# Patient Record
Sex: Male | Born: 1945
Health system: Southern US, Community
[De-identification: ages and names within clinical notes are randomized; demographics above are authoritative.]

## PROBLEM LIST (undated history)

## (undated) DIAGNOSIS — Z8781 Personal history of (healed) traumatic fracture: Secondary | ICD-10-CM

## (undated) DIAGNOSIS — G4730 Sleep apnea, unspecified: Secondary | ICD-10-CM

## (undated) DIAGNOSIS — K449 Diaphragmatic hernia without obstruction or gangrene: Secondary | ICD-10-CM

## (undated) DIAGNOSIS — C4490 Unspecified malignant neoplasm of skin, unspecified: Secondary | ICD-10-CM

## (undated) DIAGNOSIS — Z8489 Family history of other specified conditions: Secondary | ICD-10-CM

## (undated) DIAGNOSIS — R7303 Prediabetes: Secondary | ICD-10-CM

## (undated) DIAGNOSIS — Z860101 Personal history of adenomatous and serrated colon polyps: Secondary | ICD-10-CM

## (undated) DIAGNOSIS — E785 Hyperlipidemia, unspecified: Secondary | ICD-10-CM

## (undated) DIAGNOSIS — J869 Pyothorax without fistula: Secondary | ICD-10-CM

## (undated) DIAGNOSIS — I10 Essential (primary) hypertension: Secondary | ICD-10-CM

## (undated) DIAGNOSIS — J309 Allergic rhinitis, unspecified: Secondary | ICD-10-CM

## (undated) DIAGNOSIS — E669 Obesity, unspecified: Secondary | ICD-10-CM

## (undated) DIAGNOSIS — N400 Enlarged prostate without lower urinary tract symptoms: Secondary | ICD-10-CM

## (undated) DIAGNOSIS — K219 Gastro-esophageal reflux disease without esophagitis: Secondary | ICD-10-CM

## (undated) DIAGNOSIS — Z86010 Personal history of colonic polyps: Secondary | ICD-10-CM

## (undated) DIAGNOSIS — Z8619 Personal history of other infectious and parasitic diseases: Secondary | ICD-10-CM

## (undated) DIAGNOSIS — Z87442 Personal history of urinary calculi: Secondary | ICD-10-CM

## (undated) DIAGNOSIS — M1990 Unspecified osteoarthritis, unspecified site: Secondary | ICD-10-CM

## (undated) HISTORY — PX: HERNIA REPAIR: SHX51

## (undated) HISTORY — PX: APPENDECTOMY: SHX54

## (undated) HISTORY — PX: EMPYEMA DRAINAGE: SHX5097

## (undated) HISTORY — PX: LUMBAR PUNCTURE: SHX1985

## (undated) HISTORY — PX: FRACTURE SURGERY: SHX138

## (undated) HISTORY — PX: JOINT REPLACEMENT: SHX530

## (undated) HISTORY — PX: CATARACT EXTRACTION: SUR2

## (undated) HISTORY — PX: EYE SURGERY: SHX253

---

## 2000-07-13 ENCOUNTER — Ambulatory Visit (HOSPITAL_BASED_OUTPATIENT_CLINIC_OR_DEPARTMENT_OTHER): Admission: RE | Admit: 2000-07-13 | Discharge: 2000-07-13 | Payer: Self-pay | Admitting: Orthopedic Surgery

## 2001-02-23 HISTORY — PX: FRACTURE SURGERY: SHX138

## 2007-07-14 ENCOUNTER — Ambulatory Visit: Payer: Self-pay | Admitting: Family Medicine

## 2007-09-16 ENCOUNTER — Ambulatory Visit: Payer: Self-pay | Admitting: Gastroenterology

## 2010-01-07 ENCOUNTER — Ambulatory Visit: Payer: Self-pay | Admitting: Internal Medicine

## 2012-02-25 ENCOUNTER — Ambulatory Visit: Payer: Self-pay | Admitting: Family Medicine

## 2012-02-25 LAB — RAPID STREP-A WITH REFLX: Micro Text Report: NEGATIVE

## 2012-02-27 LAB — BETA STREP CULTURE(ARMC)

## 2012-05-11 ENCOUNTER — Ambulatory Visit: Payer: Self-pay | Admitting: Family Medicine

## 2012-05-11 LAB — RAPID INFLUENZA A&B ANTIGENS

## 2013-05-22 ENCOUNTER — Ambulatory Visit: Payer: Self-pay | Admitting: Gastroenterology

## 2013-05-25 LAB — PATHOLOGY REPORT

## 2013-09-11 DIAGNOSIS — K219 Gastro-esophageal reflux disease without esophagitis: Secondary | ICD-10-CM | POA: Insufficient documentation

## 2013-09-11 DIAGNOSIS — I10 Essential (primary) hypertension: Secondary | ICD-10-CM | POA: Insufficient documentation

## 2014-02-25 ENCOUNTER — Ambulatory Visit: Payer: Self-pay | Admitting: Internal Medicine

## 2014-10-19 ENCOUNTER — Ambulatory Visit
Admission: EM | Admit: 2014-10-19 | Discharge: 2014-10-19 | Disposition: A | Discharge status: Home / Self Care | Payer: PPO | Attending: Family Medicine | Admitting: Family Medicine

## 2014-10-19 ENCOUNTER — Encounter: Payer: Self-pay | Admitting: Emergency Medicine

## 2014-10-19 DIAGNOSIS — S86811A Strain of other muscle(s) and tendon(s) at lower leg level, right leg, initial encounter: Secondary | ICD-10-CM

## 2014-10-19 DIAGNOSIS — M7121 Synovial cyst of popliteal space [Baker], right knee: Secondary | ICD-10-CM

## 2014-10-19 DIAGNOSIS — S86911A Strain of unspecified muscle(s) and tendon(s) at lower leg level, right leg, initial encounter: Secondary | ICD-10-CM

## 2014-10-19 HISTORY — DX: Essential (primary) hypertension: I10

## 2014-10-19 MED ORDER — MELOXICAM 15 MG PO TABS: 15.0000 mg | ORAL_TABLET | Freq: Every day | ORAL | Status: DC

## 2014-10-19 NOTE — Discharge Instructions (Signed)
Knee Effusion  Knee effusion means you have fluid in your knee. The knee may be more difficult to bend and move. HOME CARE  Use crutches or a brace as told by your doctor.  Put ice on the injured area.  Put ice in a plastic bag.  Place a towel between your skin and the bag.  Leave the ice on for 15-20 minutes, 03-04 times a day.  Raise (elevate) your knee as much as possible.  Only take medicine as told by your doctor.  You may need to do strengthening exercises. Ask your doctor.  Continue with your normal diet and activities as told by your doctor. GET HELP RIGHT AWAY IF:  You have more puffiness (swelling) in your knee.  You see redness, puffiness, or have more pain in your knee.  You have a temperature by mouth above 102 F (38.9 C).  You get a rash.  You have trouble breathing.  You have a reaction to any medicine you are taking.  You have a lot of pain when you move your knee. MAKE SURE YOU:  Understand these instructions.  Will watch your condition.  Will get help right away if you are not doing well or get worse. Document Released: 03/14/2010 Document Revised: 05/04/2011 Document Reviewed: 03/14/2010 [facility] Patient Information 2015 [place], [place]. This information is not intended to replace advice given to you by your health care provider. Make sure you discuss any questions you have with your health care provider.  Knee Pain Knee pain can be a result of an injury or other medical conditions. Treatment will depend on the cause of your pain. HOME CARE  Only take medicine as told by your doctor.  Keep a healthy weight. Being overweight can make the knee hurt more.  Stretch before exercising or playing sports.  If there is constant knee pain, change the way you exercise. Ask your doctor for advice.  Make sure shoes fit well. Choose the right shoe for the sport or activity.  Protect your knees. Wear kneepads if needed.  Rest when you are  tired. GET HELP RIGHT AWAY IF:   Your knee pain does not stop.  Your knee pain does not get better.  Your knee joint feels hot to the touch.  You have a fever. MAKE SURE YOU:   Understand these instructions.  Will watch this condition.  Will get help right away if you are not doing well or get worse. Document Released: 05/08/2008 Document Revised: 05/04/2011 Document Reviewed: 05/08/2008 [facility] Patient Information 2015 [place], [place]. This information is not intended to replace advice given to you by your health care provider. Make sure you discuss any questions you have with your health care provider.  Knee Sprain A knee sprain is a tear in the strong bands of tissue that connect the bones (ligaments) of your knee. HOME CARE  Raise (elevate) your injured knee to lessen puffiness (swelling).  To ease pain and puffiness, put ice on the injured area.  Put ice in a plastic bag.  Place a towel between your skin and the bag.  Leave the ice on for 20 minutes, 2-3 times a day.  Only take medicine as told by your doctor.  Do not leave your knee unprotected until pain and stiffness go away (usually 4-6 weeks).  If you have a cast or splint, do not get it wet. If your doctor told you to not take it off, cover it with a plastic bag when you shower or bathe.  Do not swim.  Your doctor may have you do exercises to prevent or limit permanent weakness and stiffness. GET HELP RIGHT AWAY IF:   Your cast or splint becomes damaged.  Your pain gets worse.  You have a lot of pain, puffiness, or numbness below the cast or splint. MAKE SURE YOU:   Understand these instructions.  Will watch your condition.  Will get help right away if you are not doing well or get worse. Document Released: 01/28/2009 Document Revised: 02/14/2013 Document Reviewed: 10/18/2012 [facility] Patient Information 2015 [place], [place]. This information is not intended to replace advice given to you by your  health care provider. Make sure you discuss any questions you have with your health care provider.  Cryotherapy Cryotherapy is when you put ice on your injury. Ice helps lessen pain and puffiness (swelling) after an injury. Ice works the best when you start using it in the first 24 to 48 hours after an injury. HOME CARE  Put a dry or damp towel between the ice pack and your skin.  You may press gently on the ice pack.  Leave the ice on for no more than 10 to 20 minutes at a time.  Check your skin after 5 minutes to make sure your skin is okay.  Rest at least 20 minutes between ice pack uses.  Stop using ice when your skin loses feeling (numbness).  Do not use ice on someone who cannot tell you when it hurts. This includes small children and people with memory problems (dementia). GET HELP RIGHT AWAY IF:  You have white spots on your skin.  Your skin turns blue or pale.  Your skin feels waxy or hard.  Your puffiness gets worse. MAKE SURE YOU:   Understand these instructions.  Will watch your condition.  Will get help right away if you are not doing well or get worse. Document Released: 07/29/2007 Document Revised: 05/04/2011 Document Reviewed: 10/02/2010 [facility] Patient Information 2015 [place], [place]. This information is not intended to replace advice given to you by your health care provider. Make sure you discuss any questions you have with your health care provider.  Baker Cyst A Baker cyst is a sac-like structure that forms in the back of the knee. It is filled with the same fluid that is located in your knee. This fluid lubricates the bones and cartilage of the knee and allows them to move over each other more easily. CAUSES  When the knee becomes injured or inflamed, increased fluid forms in the knee. When this happens, the joint lining is pushed out behind the knee and forms the Baker cyst. This cyst may also be caused by inflammation from arthritic conditions and  infections. SIGNS AND SYMPTOMS  A Baker cyst usually has no symptoms. When the cyst is substantially enlarged:  You may feel pressure behind the knee, stiffness in the knee, or a mass in the area behind the knee.  You may develop pain, redness, and swelling in the calf. This can suggest a blood clot and requires evaluation by your health care provider. DIAGNOSIS  A Baker cyst is most often found during an ultrasound exam. This exam may have been performed for other reasons, and the cyst was found incidentally. Sometimes an MRI is used. This picks up other problems within a joint that an ultrasound exam may not. If the Baker cyst developed immediately after an injury, X-ray exams may be used to diagnose the cyst. TREATMENT  The treatment depends on the  cause of the cyst. Anti-inflammatory medicines and rest often will be prescribed. If the cyst is caused by a bacterial infection, antibiotic medicines may be prescribed.  HOME CARE INSTRUCTIONS   If the cyst was caused by an injury, for the first 24 hours, keep the injured leg elevated on 2 pillows while lying down.  For the first 24 hours while you are awake, apply ice to the injured area:  Put ice in a plastic bag.  Place a towel between your skin and the bag.  Leave the ice on for 20 minutes, 2-3 times a day.  Only take over-the-counter or prescription medicines for pain, discomfort, or fever as directed by your health care provider.  Only take antibiotic medicine as directed. Make sure to finish it even if you start to feel better. MAKE SURE YOU:   Understand these instructions.  Will watch your condition.  Will get help right away if you are not doing well or get worse. Document Released: 02/09/2005 Document Revised: 11/30/2012 Document Reviewed: 09/21/2012 [facility] Patient Information 2015 [place], [place]. This information is not intended to replace advice given to you by your health care provider. Make sure you discuss any  questions you have with your health care provider.

## 2014-10-19 NOTE — ED Provider Notes (Signed)
CSN: [id number]     Arrival date & time 10/19/14  1429 History   First MD Initiated Contact with Patient 10/19/14 1515     Chief Complaint  Patient presents with  . Knee Injury   patient 69 year old white male who mowingHis yard Monday when he twisted his knee started having pain. States pain really started on Tuesday morning and has progressively gotten worse  he also reports discomfort when he tries to bend his leg and move his leg around as well. He works for [employer] and has been on his feet Tuesday with her Thursday with increased pain and discomfort in his leg.  (Consider location/radiation/quality/duration/timing/severity/associated sxs/prior Treatment) Patient is a 69 y.o. male presenting with knee pain. The history is provided by the patient. No language interpreter was used.  Knee Pain Location:  Knee Time since incident:  5 days Pain details:    Quality:  Aching, throbbing and shooting   Radiates to:  Does not radiate   Severity:  Moderate   Onset quality:  Sudden (On Monday afternoon after mowing his lawn)   Duration:  5 days   Progression:  Worsening Chronicity:  New Dislocation: no   Relieved by:  Nothing Worsened by:  Bearing weight and activity Ineffective treatments:  None tried Associated symptoms: decreased ROM, muscle weakness and swelling   Risk factors: no concern for non-accidental trauma, no frequent fractures, no known bone disorder, no obesity and no recent illness     Past Medical History  Diagnosis Date  . Hypertension    Past Surgical History  Procedure Laterality Date  . Fracture surgery     History reviewed. No pertinent family history. Social History  Substance Use Topics  . Smoking status: Never Smoker   . Smokeless tobacco: None  . Alcohol Use: Yes    Review of Systems  Constitutional: Positive for activity change.  Musculoskeletal: Positive for myalgias, joint swelling and gait problem.  Skin: Negative for color change.    Neurological: Positive for dizziness.    Allergies  Codeine  Home Medications   Prior to Admission medications   Medication Sig Start Date End Date Taking? Authorizing Provider  amLODipine (NORVASC) 10 MG tablet Take 10 mg by mouth daily.   Yes Historical Provider, MD  amLODipine-benazepril (LOTREL) 5-10 MG per capsule Take 1 capsule by mouth daily.   Yes Historical Provider, MD  atenolol (TENORMIN) 50 MG tablet Take 50 mg by mouth daily.   Yes Historical Provider, MD  meloxicam (MOBIC) 15 MG tablet Take 1 tablet (15 mg total) by mouth daily. 10/19/14   Frederich Cha, MD   Meds Ordered and Administered this Visit  Medications - No data to display  BP 156/65 mmHg  Pulse 65  Temp(Src) 98.7 F (37.1 C) (Oral)  Resp 16  Ht 5\' 11"  (1.803 m)  Wt 200 lb (90.719 kg)  BMI 27.91 kg/m2  SpO2 99% No data found.   Physical Exam  Constitutional: He is oriented to person, place, and time. He appears well-developed.  HENT:  Head: Normocephalic.  Musculoskeletal: He exhibits edema and tenderness.       Right upper leg: He exhibits tenderness, swelling and edema. He exhibits no bony tenderness.       Legs: Patient shows no laxity of the right knee joint no tenderness in the anterior portion of the knee however the posterior area he has a Baker's cyst is tender to palpation and is appreciably swollen.  Neurological: He is alert and oriented to  person, place, and time.  Skin: Skin is warm and dry.  Psychiatric: He has a normal mood and affect. His behavior is normal.  Vitals reviewed.   ED Course  Procedures (including critical care time)  Labs Review Labs Reviewed - No data to display  Imaging Review No results found.   Visual Acuity Review  Right Eye Distance:   Left Eye Distance:   Bilateral Distance:    Right Eye Near:   Left Eye Near:    Bilateral Near:         MDM   1. Knee strain, right, initial encounter   2. Baker cyst, right   We'll place on Mobic 15 mg 1  tablet day at this return Monday thinks not better recommend icing the knee elevating the leg and staying off the leg at least Monday and work note given for him for Sunday as well.  Patient that he may have a Baker's cyst but may need to be drained by orthopedic if not better. Explained to him I'm here Monday morning about that to be evaluated to light or shortness prostate referral he can call us and I'll be glad to try to facilitate that as well.    Frederich Cha, MD 10/19/14 2227

## 2014-10-19 NOTE — ED Notes (Signed)
Pt with pain x 3 days r knee

## 2015-03-05 DIAGNOSIS — R739 Hyperglycemia, unspecified: Secondary | ICD-10-CM | POA: Diagnosis not present

## 2015-03-05 DIAGNOSIS — Z79899 Other long term (current) drug therapy: Secondary | ICD-10-CM | POA: Diagnosis not present

## 2015-03-05 DIAGNOSIS — E7800 Pure hypercholesterolemia, unspecified: Secondary | ICD-10-CM | POA: Diagnosis not present

## 2015-03-11 DIAGNOSIS — E7800 Pure hypercholesterolemia, unspecified: Secondary | ICD-10-CM | POA: Diagnosis not present

## 2015-03-11 DIAGNOSIS — N400 Enlarged prostate without lower urinary tract symptoms: Secondary | ICD-10-CM | POA: Diagnosis not present

## 2015-03-11 DIAGNOSIS — Z79899 Other long term (current) drug therapy: Secondary | ICD-10-CM | POA: Diagnosis not present

## 2015-03-11 DIAGNOSIS — R972 Elevated prostate specific antigen [PSA]: Secondary | ICD-10-CM | POA: Diagnosis not present

## 2015-03-11 DIAGNOSIS — K219 Gastro-esophageal reflux disease without esophagitis: Secondary | ICD-10-CM | POA: Diagnosis not present

## 2015-03-11 DIAGNOSIS — I10 Essential (primary) hypertension: Secondary | ICD-10-CM | POA: Diagnosis not present

## 2015-03-11 DIAGNOSIS — R739 Hyperglycemia, unspecified: Secondary | ICD-10-CM | POA: Diagnosis not present

## 2015-05-09 DIAGNOSIS — H35033 Hypertensive retinopathy, bilateral: Secondary | ICD-10-CM | POA: Diagnosis not present

## 2015-06-03 DIAGNOSIS — Z1283 Encounter for screening for malignant neoplasm of skin: Secondary | ICD-10-CM | POA: Diagnosis not present

## 2015-06-03 DIAGNOSIS — Z872 Personal history of diseases of the skin and subcutaneous tissue: Secondary | ICD-10-CM | POA: Diagnosis not present

## 2015-06-03 DIAGNOSIS — D2339 Other benign neoplasm of skin of other parts of face: Secondary | ICD-10-CM | POA: Diagnosis not present

## 2015-06-03 DIAGNOSIS — Z85828 Personal history of other malignant neoplasm of skin: Secondary | ICD-10-CM | POA: Diagnosis not present

## 2015-06-03 DIAGNOSIS — Z08 Encounter for follow-up examination after completed treatment for malignant neoplasm: Secondary | ICD-10-CM | POA: Diagnosis not present

## 2015-09-03 DIAGNOSIS — N529 Male erectile dysfunction, unspecified: Secondary | ICD-10-CM | POA: Diagnosis not present

## 2015-09-03 DIAGNOSIS — Z79899 Other long term (current) drug therapy: Secondary | ICD-10-CM | POA: Diagnosis not present

## 2015-09-03 DIAGNOSIS — E7800 Pure hypercholesterolemia, unspecified: Secondary | ICD-10-CM | POA: Diagnosis not present

## 2015-09-03 DIAGNOSIS — R972 Elevated prostate specific antigen [PSA]: Secondary | ICD-10-CM | POA: Diagnosis not present

## 2015-09-03 DIAGNOSIS — R739 Hyperglycemia, unspecified: Secondary | ICD-10-CM | POA: Diagnosis not present

## 2015-09-03 DIAGNOSIS — Z125 Encounter for screening for malignant neoplasm of prostate: Secondary | ICD-10-CM | POA: Diagnosis not present

## 2015-09-10 DIAGNOSIS — N5201 Erectile dysfunction due to arterial insufficiency: Secondary | ICD-10-CM | POA: Diagnosis not present

## 2015-09-10 DIAGNOSIS — R972 Elevated prostate specific antigen [PSA]: Secondary | ICD-10-CM | POA: Diagnosis not present

## 2015-09-10 DIAGNOSIS — N401 Enlarged prostate with lower urinary tract symptoms: Secondary | ICD-10-CM | POA: Diagnosis not present

## 2015-09-10 DIAGNOSIS — E7800 Pure hypercholesterolemia, unspecified: Secondary | ICD-10-CM | POA: Diagnosis not present

## 2015-09-10 DIAGNOSIS — Z79899 Other long term (current) drug therapy: Secondary | ICD-10-CM | POA: Diagnosis not present

## 2015-09-10 DIAGNOSIS — R350 Frequency of micturition: Secondary | ICD-10-CM | POA: Diagnosis not present

## 2015-09-10 DIAGNOSIS — Z0000 Encounter for general adult medical examination without abnormal findings: Secondary | ICD-10-CM | POA: Diagnosis not present

## 2015-11-18 DIAGNOSIS — H40019 Open angle with borderline findings, low risk, unspecified eye: Secondary | ICD-10-CM | POA: Diagnosis not present

## 2016-03-05 DIAGNOSIS — E7800 Pure hypercholesterolemia, unspecified: Secondary | ICD-10-CM | POA: Diagnosis not present

## 2016-03-05 DIAGNOSIS — Z79899 Other long term (current) drug therapy: Secondary | ICD-10-CM | POA: Diagnosis not present

## 2016-03-05 DIAGNOSIS — R972 Elevated prostate specific antigen [PSA]: Secondary | ICD-10-CM | POA: Diagnosis not present

## 2016-03-26 DIAGNOSIS — Z79899 Other long term (current) drug therapy: Secondary | ICD-10-CM | POA: Diagnosis not present

## 2016-03-26 DIAGNOSIS — G4730 Sleep apnea, unspecified: Secondary | ICD-10-CM | POA: Diagnosis not present

## 2016-03-26 DIAGNOSIS — Z125 Encounter for screening for malignant neoplasm of prostate: Secondary | ICD-10-CM | POA: Diagnosis not present

## 2016-03-26 DIAGNOSIS — I10 Essential (primary) hypertension: Secondary | ICD-10-CM | POA: Diagnosis not present

## 2016-03-26 DIAGNOSIS — J069 Acute upper respiratory infection, unspecified: Secondary | ICD-10-CM | POA: Diagnosis not present

## 2016-03-26 DIAGNOSIS — E7800 Pure hypercholesterolemia, unspecified: Secondary | ICD-10-CM | POA: Diagnosis not present

## 2016-04-22 ENCOUNTER — Ambulatory Visit: Payer: PPO | Attending: Family Medicine

## 2016-04-22 DIAGNOSIS — G4733 Obstructive sleep apnea (adult) (pediatric): Secondary | ICD-10-CM | POA: Insufficient documentation

## 2016-04-22 DIAGNOSIS — E785 Hyperlipidemia, unspecified: Secondary | ICD-10-CM | POA: Insufficient documentation

## 2016-04-22 DIAGNOSIS — Z7982 Long term (current) use of aspirin: Secondary | ICD-10-CM | POA: Insufficient documentation

## 2016-04-22 DIAGNOSIS — Z79899 Other long term (current) drug therapy: Secondary | ICD-10-CM | POA: Insufficient documentation

## 2016-04-22 DIAGNOSIS — I10 Essential (primary) hypertension: Secondary | ICD-10-CM | POA: Diagnosis not present

## 2016-04-22 DIAGNOSIS — Z885 Allergy status to narcotic agent status: Secondary | ICD-10-CM | POA: Insufficient documentation

## 2016-04-22 DIAGNOSIS — G4731 Primary central sleep apnea: Secondary | ICD-10-CM | POA: Diagnosis not present

## 2016-04-26 DIAGNOSIS — G4733 Obstructive sleep apnea (adult) (pediatric): Secondary | ICD-10-CM | POA: Diagnosis not present

## 2016-05-07 DIAGNOSIS — R3912 Poor urinary stream: Secondary | ICD-10-CM | POA: Diagnosis not present

## 2016-05-07 DIAGNOSIS — N5201 Erectile dysfunction due to arterial insufficiency: Secondary | ICD-10-CM | POA: Diagnosis not present

## 2016-05-07 DIAGNOSIS — R972 Elevated prostate specific antigen [PSA]: Secondary | ICD-10-CM | POA: Diagnosis not present

## 2016-05-07 DIAGNOSIS — N401 Enlarged prostate with lower urinary tract symptoms: Secondary | ICD-10-CM | POA: Diagnosis not present

## 2016-05-19 ENCOUNTER — Ambulatory Visit: Payer: PPO | Attending: Neurology

## 2016-05-19 DIAGNOSIS — Z79899 Other long term (current) drug therapy: Secondary | ICD-10-CM | POA: Insufficient documentation

## 2016-05-19 DIAGNOSIS — Z7982 Long term (current) use of aspirin: Secondary | ICD-10-CM | POA: Diagnosis not present

## 2016-05-19 DIAGNOSIS — R0683 Snoring: Secondary | ICD-10-CM | POA: Insufficient documentation

## 2016-05-19 DIAGNOSIS — G4733 Obstructive sleep apnea (adult) (pediatric): Secondary | ICD-10-CM | POA: Diagnosis not present

## 2016-05-22 DIAGNOSIS — G4733 Obstructive sleep apnea (adult) (pediatric): Secondary | ICD-10-CM | POA: Diagnosis not present

## 2016-06-02 DIAGNOSIS — Z872 Personal history of diseases of the skin and subcutaneous tissue: Secondary | ICD-10-CM | POA: Diagnosis not present

## 2016-06-02 DIAGNOSIS — Z85828 Personal history of other malignant neoplasm of skin: Secondary | ICD-10-CM | POA: Diagnosis not present

## 2016-06-02 DIAGNOSIS — H40019 Open angle with borderline findings, low risk, unspecified eye: Secondary | ICD-10-CM | POA: Diagnosis not present

## 2016-06-02 DIAGNOSIS — Z86018 Personal history of other benign neoplasm: Secondary | ICD-10-CM | POA: Diagnosis not present

## 2016-06-02 DIAGNOSIS — L570 Actinic keratosis: Secondary | ICD-10-CM | POA: Diagnosis not present

## 2016-07-23 DIAGNOSIS — G4733 Obstructive sleep apnea (adult) (pediatric): Secondary | ICD-10-CM | POA: Diagnosis not present

## 2016-08-22 DIAGNOSIS — G4733 Obstructive sleep apnea (adult) (pediatric): Secondary | ICD-10-CM | POA: Diagnosis not present

## 2016-09-22 DIAGNOSIS — G4733 Obstructive sleep apnea (adult) (pediatric): Secondary | ICD-10-CM | POA: Diagnosis not present

## 2016-09-24 DIAGNOSIS — E7800 Pure hypercholesterolemia, unspecified: Secondary | ICD-10-CM | POA: Diagnosis not present

## 2016-09-24 DIAGNOSIS — Z125 Encounter for screening for malignant neoplasm of prostate: Secondary | ICD-10-CM | POA: Diagnosis not present

## 2016-09-24 DIAGNOSIS — Z79899 Other long term (current) drug therapy: Secondary | ICD-10-CM | POA: Diagnosis not present

## 2016-10-01 DIAGNOSIS — Z0000 Encounter for general adult medical examination without abnormal findings: Secondary | ICD-10-CM | POA: Diagnosis not present

## 2016-11-03 DIAGNOSIS — R972 Elevated prostate specific antigen [PSA]: Secondary | ICD-10-CM | POA: Diagnosis not present

## 2016-11-10 DIAGNOSIS — R3912 Poor urinary stream: Secondary | ICD-10-CM | POA: Diagnosis not present

## 2016-11-10 DIAGNOSIS — N5201 Erectile dysfunction due to arterial insufficiency: Secondary | ICD-10-CM | POA: Diagnosis not present

## 2016-11-10 DIAGNOSIS — R972 Elevated prostate specific antigen [PSA]: Secondary | ICD-10-CM | POA: Diagnosis not present

## 2016-11-10 DIAGNOSIS — N401 Enlarged prostate with lower urinary tract symptoms: Secondary | ICD-10-CM | POA: Diagnosis not present

## 2016-12-23 DIAGNOSIS — G4733 Obstructive sleep apnea (adult) (pediatric): Secondary | ICD-10-CM | POA: Diagnosis not present

## 2017-01-22 DIAGNOSIS — G4733 Obstructive sleep apnea (adult) (pediatric): Secondary | ICD-10-CM | POA: Diagnosis not present

## 2017-02-22 DIAGNOSIS — G4733 Obstructive sleep apnea (adult) (pediatric): Secondary | ICD-10-CM | POA: Diagnosis not present

## 2017-02-23 HISTORY — PX: CATARACT EXTRACTION: SUR2

## 2017-03-16 DIAGNOSIS — M9903 Segmental and somatic dysfunction of lumbar region: Secondary | ICD-10-CM | POA: Diagnosis not present

## 2017-03-16 DIAGNOSIS — M5136 Other intervertebral disc degeneration, lumbar region: Secondary | ICD-10-CM | POA: Diagnosis not present

## 2017-03-16 DIAGNOSIS — M9905 Segmental and somatic dysfunction of pelvic region: Secondary | ICD-10-CM | POA: Diagnosis not present

## 2017-03-16 DIAGNOSIS — M5417 Radiculopathy, lumbosacral region: Secondary | ICD-10-CM | POA: Diagnosis not present

## 2017-03-17 DIAGNOSIS — M5136 Other intervertebral disc degeneration, lumbar region: Secondary | ICD-10-CM | POA: Diagnosis not present

## 2017-03-17 DIAGNOSIS — M5417 Radiculopathy, lumbosacral region: Secondary | ICD-10-CM | POA: Diagnosis not present

## 2017-03-17 DIAGNOSIS — M9905 Segmental and somatic dysfunction of pelvic region: Secondary | ICD-10-CM | POA: Diagnosis not present

## 2017-03-17 DIAGNOSIS — M9903 Segmental and somatic dysfunction of lumbar region: Secondary | ICD-10-CM | POA: Diagnosis not present

## 2017-03-19 DIAGNOSIS — M9903 Segmental and somatic dysfunction of lumbar region: Secondary | ICD-10-CM | POA: Diagnosis not present

## 2017-03-19 DIAGNOSIS — M9905 Segmental and somatic dysfunction of pelvic region: Secondary | ICD-10-CM | POA: Diagnosis not present

## 2017-03-19 DIAGNOSIS — M5136 Other intervertebral disc degeneration, lumbar region: Secondary | ICD-10-CM | POA: Diagnosis not present

## 2017-03-19 DIAGNOSIS — M5417 Radiculopathy, lumbosacral region: Secondary | ICD-10-CM | POA: Diagnosis not present

## 2017-03-23 DIAGNOSIS — M5417 Radiculopathy, lumbosacral region: Secondary | ICD-10-CM | POA: Diagnosis not present

## 2017-03-23 DIAGNOSIS — M5136 Other intervertebral disc degeneration, lumbar region: Secondary | ICD-10-CM | POA: Diagnosis not present

## 2017-03-23 DIAGNOSIS — M9903 Segmental and somatic dysfunction of lumbar region: Secondary | ICD-10-CM | POA: Diagnosis not present

## 2017-03-23 DIAGNOSIS — M9905 Segmental and somatic dysfunction of pelvic region: Secondary | ICD-10-CM | POA: Diagnosis not present

## 2017-03-24 DIAGNOSIS — M9905 Segmental and somatic dysfunction of pelvic region: Secondary | ICD-10-CM | POA: Diagnosis not present

## 2017-03-24 DIAGNOSIS — M5417 Radiculopathy, lumbosacral region: Secondary | ICD-10-CM | POA: Diagnosis not present

## 2017-03-24 DIAGNOSIS — M5136 Other intervertebral disc degeneration, lumbar region: Secondary | ICD-10-CM | POA: Diagnosis not present

## 2017-03-24 DIAGNOSIS — M9903 Segmental and somatic dysfunction of lumbar region: Secondary | ICD-10-CM | POA: Diagnosis not present

## 2017-03-25 DIAGNOSIS — G4733 Obstructive sleep apnea (adult) (pediatric): Secondary | ICD-10-CM | POA: Diagnosis not present

## 2017-03-26 DIAGNOSIS — M9905 Segmental and somatic dysfunction of pelvic region: Secondary | ICD-10-CM | POA: Diagnosis not present

## 2017-03-26 DIAGNOSIS — M5417 Radiculopathy, lumbosacral region: Secondary | ICD-10-CM | POA: Diagnosis not present

## 2017-03-26 DIAGNOSIS — M9903 Segmental and somatic dysfunction of lumbar region: Secondary | ICD-10-CM | POA: Diagnosis not present

## 2017-03-26 DIAGNOSIS — M5136 Other intervertebral disc degeneration, lumbar region: Secondary | ICD-10-CM | POA: Diagnosis not present

## 2017-03-29 DIAGNOSIS — M9905 Segmental and somatic dysfunction of pelvic region: Secondary | ICD-10-CM | POA: Diagnosis not present

## 2017-03-29 DIAGNOSIS — M9903 Segmental and somatic dysfunction of lumbar region: Secondary | ICD-10-CM | POA: Diagnosis not present

## 2017-03-29 DIAGNOSIS — R739 Hyperglycemia, unspecified: Secondary | ICD-10-CM | POA: Diagnosis not present

## 2017-03-29 DIAGNOSIS — M5136 Other intervertebral disc degeneration, lumbar region: Secondary | ICD-10-CM | POA: Diagnosis not present

## 2017-03-29 DIAGNOSIS — E7800 Pure hypercholesterolemia, unspecified: Secondary | ICD-10-CM | POA: Diagnosis not present

## 2017-03-29 DIAGNOSIS — Z79899 Other long term (current) drug therapy: Secondary | ICD-10-CM | POA: Diagnosis not present

## 2017-03-29 DIAGNOSIS — M5417 Radiculopathy, lumbosacral region: Secondary | ICD-10-CM | POA: Diagnosis not present

## 2017-03-30 DIAGNOSIS — M9905 Segmental and somatic dysfunction of pelvic region: Secondary | ICD-10-CM | POA: Diagnosis not present

## 2017-03-30 DIAGNOSIS — M5417 Radiculopathy, lumbosacral region: Secondary | ICD-10-CM | POA: Diagnosis not present

## 2017-03-30 DIAGNOSIS — M5136 Other intervertebral disc degeneration, lumbar region: Secondary | ICD-10-CM | POA: Diagnosis not present

## 2017-03-30 DIAGNOSIS — M9903 Segmental and somatic dysfunction of lumbar region: Secondary | ICD-10-CM | POA: Diagnosis not present

## 2017-03-31 DIAGNOSIS — M9903 Segmental and somatic dysfunction of lumbar region: Secondary | ICD-10-CM | POA: Diagnosis not present

## 2017-03-31 DIAGNOSIS — M5136 Other intervertebral disc degeneration, lumbar region: Secondary | ICD-10-CM | POA: Diagnosis not present

## 2017-03-31 DIAGNOSIS — M5417 Radiculopathy, lumbosacral region: Secondary | ICD-10-CM | POA: Diagnosis not present

## 2017-03-31 DIAGNOSIS — M9905 Segmental and somatic dysfunction of pelvic region: Secondary | ICD-10-CM | POA: Diagnosis not present

## 2017-04-05 DIAGNOSIS — M9905 Segmental and somatic dysfunction of pelvic region: Secondary | ICD-10-CM | POA: Diagnosis not present

## 2017-04-05 DIAGNOSIS — I10 Essential (primary) hypertension: Secondary | ICD-10-CM | POA: Diagnosis not present

## 2017-04-05 DIAGNOSIS — M9903 Segmental and somatic dysfunction of lumbar region: Secondary | ICD-10-CM | POA: Diagnosis not present

## 2017-04-05 DIAGNOSIS — G4733 Obstructive sleep apnea (adult) (pediatric): Secondary | ICD-10-CM | POA: Diagnosis not present

## 2017-04-05 DIAGNOSIS — M5136 Other intervertebral disc degeneration, lumbar region: Secondary | ICD-10-CM | POA: Diagnosis not present

## 2017-04-05 DIAGNOSIS — M5417 Radiculopathy, lumbosacral region: Secondary | ICD-10-CM | POA: Diagnosis not present

## 2017-04-05 DIAGNOSIS — E7800 Pure hypercholesterolemia, unspecified: Secondary | ICD-10-CM | POA: Diagnosis not present

## 2017-04-05 DIAGNOSIS — R739 Hyperglycemia, unspecified: Secondary | ICD-10-CM | POA: Diagnosis not present

## 2017-04-05 DIAGNOSIS — N401 Enlarged prostate with lower urinary tract symptoms: Secondary | ICD-10-CM | POA: Diagnosis not present

## 2017-04-05 DIAGNOSIS — R351 Nocturia: Secondary | ICD-10-CM | POA: Diagnosis not present

## 2017-04-05 DIAGNOSIS — Z79899 Other long term (current) drug therapy: Secondary | ICD-10-CM | POA: Diagnosis not present

## 2017-04-08 DIAGNOSIS — M9905 Segmental and somatic dysfunction of pelvic region: Secondary | ICD-10-CM | POA: Diagnosis not present

## 2017-04-08 DIAGNOSIS — M9903 Segmental and somatic dysfunction of lumbar region: Secondary | ICD-10-CM | POA: Diagnosis not present

## 2017-04-08 DIAGNOSIS — M5136 Other intervertebral disc degeneration, lumbar region: Secondary | ICD-10-CM | POA: Diagnosis not present

## 2017-04-08 DIAGNOSIS — M5417 Radiculopathy, lumbosacral region: Secondary | ICD-10-CM | POA: Diagnosis not present

## 2017-04-12 DIAGNOSIS — M9903 Segmental and somatic dysfunction of lumbar region: Secondary | ICD-10-CM | POA: Diagnosis not present

## 2017-04-12 DIAGNOSIS — M5136 Other intervertebral disc degeneration, lumbar region: Secondary | ICD-10-CM | POA: Diagnosis not present

## 2017-04-12 DIAGNOSIS — M9905 Segmental and somatic dysfunction of pelvic region: Secondary | ICD-10-CM | POA: Diagnosis not present

## 2017-04-12 DIAGNOSIS — M5417 Radiculopathy, lumbosacral region: Secondary | ICD-10-CM | POA: Diagnosis not present

## 2017-04-15 DIAGNOSIS — M5136 Other intervertebral disc degeneration, lumbar region: Secondary | ICD-10-CM | POA: Diagnosis not present

## 2017-04-15 DIAGNOSIS — M9905 Segmental and somatic dysfunction of pelvic region: Secondary | ICD-10-CM | POA: Diagnosis not present

## 2017-04-15 DIAGNOSIS — M5417 Radiculopathy, lumbosacral region: Secondary | ICD-10-CM | POA: Diagnosis not present

## 2017-04-15 DIAGNOSIS — M9903 Segmental and somatic dysfunction of lumbar region: Secondary | ICD-10-CM | POA: Diagnosis not present

## 2017-04-16 DIAGNOSIS — M5417 Radiculopathy, lumbosacral region: Secondary | ICD-10-CM | POA: Diagnosis not present

## 2017-04-16 DIAGNOSIS — M9903 Segmental and somatic dysfunction of lumbar region: Secondary | ICD-10-CM | POA: Diagnosis not present

## 2017-04-16 DIAGNOSIS — M9905 Segmental and somatic dysfunction of pelvic region: Secondary | ICD-10-CM | POA: Diagnosis not present

## 2017-04-16 DIAGNOSIS — M5136 Other intervertebral disc degeneration, lumbar region: Secondary | ICD-10-CM | POA: Diagnosis not present

## 2017-04-19 DIAGNOSIS — M9905 Segmental and somatic dysfunction of pelvic region: Secondary | ICD-10-CM | POA: Diagnosis not present

## 2017-04-19 DIAGNOSIS — M9903 Segmental and somatic dysfunction of lumbar region: Secondary | ICD-10-CM | POA: Diagnosis not present

## 2017-04-19 DIAGNOSIS — M5417 Radiculopathy, lumbosacral region: Secondary | ICD-10-CM | POA: Diagnosis not present

## 2017-04-19 DIAGNOSIS — M5136 Other intervertebral disc degeneration, lumbar region: Secondary | ICD-10-CM | POA: Diagnosis not present

## 2017-04-21 DIAGNOSIS — M5417 Radiculopathy, lumbosacral region: Secondary | ICD-10-CM | POA: Diagnosis not present

## 2017-04-21 DIAGNOSIS — M9903 Segmental and somatic dysfunction of lumbar region: Secondary | ICD-10-CM | POA: Diagnosis not present

## 2017-04-21 DIAGNOSIS — M9905 Segmental and somatic dysfunction of pelvic region: Secondary | ICD-10-CM | POA: Diagnosis not present

## 2017-04-21 DIAGNOSIS — M5136 Other intervertebral disc degeneration, lumbar region: Secondary | ICD-10-CM | POA: Diagnosis not present

## 2017-04-22 DIAGNOSIS — G4733 Obstructive sleep apnea (adult) (pediatric): Secondary | ICD-10-CM | POA: Diagnosis not present

## 2017-04-26 DIAGNOSIS — M5136 Other intervertebral disc degeneration, lumbar region: Secondary | ICD-10-CM | POA: Diagnosis not present

## 2017-04-26 DIAGNOSIS — M9903 Segmental and somatic dysfunction of lumbar region: Secondary | ICD-10-CM | POA: Diagnosis not present

## 2017-04-26 DIAGNOSIS — M9905 Segmental and somatic dysfunction of pelvic region: Secondary | ICD-10-CM | POA: Diagnosis not present

## 2017-04-26 DIAGNOSIS — M5417 Radiculopathy, lumbosacral region: Secondary | ICD-10-CM | POA: Diagnosis not present

## 2017-04-28 DIAGNOSIS — M9905 Segmental and somatic dysfunction of pelvic region: Secondary | ICD-10-CM | POA: Diagnosis not present

## 2017-04-28 DIAGNOSIS — M5136 Other intervertebral disc degeneration, lumbar region: Secondary | ICD-10-CM | POA: Diagnosis not present

## 2017-04-28 DIAGNOSIS — M9903 Segmental and somatic dysfunction of lumbar region: Secondary | ICD-10-CM | POA: Diagnosis not present

## 2017-04-28 DIAGNOSIS — M5417 Radiculopathy, lumbosacral region: Secondary | ICD-10-CM | POA: Diagnosis not present

## 2017-05-03 DIAGNOSIS — M9903 Segmental and somatic dysfunction of lumbar region: Secondary | ICD-10-CM | POA: Diagnosis not present

## 2017-05-03 DIAGNOSIS — M9905 Segmental and somatic dysfunction of pelvic region: Secondary | ICD-10-CM | POA: Diagnosis not present

## 2017-05-03 DIAGNOSIS — M5417 Radiculopathy, lumbosacral region: Secondary | ICD-10-CM | POA: Diagnosis not present

## 2017-05-03 DIAGNOSIS — M5136 Other intervertebral disc degeneration, lumbar region: Secondary | ICD-10-CM | POA: Diagnosis not present

## 2017-05-06 ENCOUNTER — Ambulatory Visit
Admission: EM | Admit: 2017-05-06 | Discharge: 2017-05-06 | Disposition: A | Discharge status: Home / Self Care | Payer: PPO | Attending: Family Medicine | Admitting: Family Medicine

## 2017-05-06 ENCOUNTER — Other Ambulatory Visit: Payer: Self-pay

## 2017-05-06 ENCOUNTER — Encounter: Payer: Self-pay | Admitting: Emergency Medicine

## 2017-05-06 DIAGNOSIS — H8149 Vertigo of central origin, unspecified ear: Secondary | ICD-10-CM

## 2017-05-06 DIAGNOSIS — M9903 Segmental and somatic dysfunction of lumbar region: Secondary | ICD-10-CM | POA: Diagnosis not present

## 2017-05-06 DIAGNOSIS — M9905 Segmental and somatic dysfunction of pelvic region: Secondary | ICD-10-CM | POA: Diagnosis not present

## 2017-05-06 DIAGNOSIS — M5417 Radiculopathy, lumbosacral region: Secondary | ICD-10-CM | POA: Diagnosis not present

## 2017-05-06 DIAGNOSIS — R42 Dizziness and giddiness: Secondary | ICD-10-CM

## 2017-05-06 DIAGNOSIS — M5136 Other intervertebral disc degeneration, lumbar region: Secondary | ICD-10-CM | POA: Diagnosis not present

## 2017-05-06 HISTORY — DX: Hyperlipidemia, unspecified: E78.5

## 2017-05-06 HISTORY — DX: Sleep apnea, unspecified: G47.30

## 2017-05-06 HISTORY — DX: Benign prostatic hyperplasia without lower urinary tract symptoms: N40.0

## 2017-05-06 HISTORY — DX: Unspecified malignant neoplasm of skin, unspecified: C44.90

## 2017-05-06 MED ORDER — MECLIZINE HCL 25 MG PO TABS: 25.0000 mg | ORAL_TABLET | Freq: Three times a day (TID) | ORAL | 0 refills | Status: DC | PRN

## 2017-05-06 NOTE — ED Triage Notes (Signed)
Patient in today c/o dizziness, "buzzing" in his ear and slight headache x 1 week. Patient also states that sometimes when he goes from lying to sitting the room spins.

## 2017-05-06 NOTE — ED Provider Notes (Signed)
[facility]  CSN: [id number] Arrival date & time: 05/06/17  1440  History   Chief Complaint Chief Complaint  Patient presents with  . Dizziness   HPI  72 year old male presents with dizziness.  Has been going on for the past week.  Described as spinning.  Occurs primarily when going from lying to sitting.  Intermittent.  Last for seconds and self resolves.  Associated headache.  Has had ongoing sinus pressure as well.  Additionally, has had ringing in the ears.  No medications or interventions tried.  Exacerbated by movement as stated above.  No other associated symptoms.  No other complaints.  Past Medical History:  Diagnosis Date  . BPH (benign prostatic hyperplasia)   . Hyperlipidemia   . Hypertension   . Skin cancer    BCC, SCC  . Sleep apnea    Past Surgical History:  Procedure Laterality Date  . APPENDECTOMY    . FRACTURE SURGERY    . HERNIA REPAIR     hiatal   Home Medications    Prior to Admission medications   Medication Sig Start Date End Date Taking? Authorizing Provider  amLODipine (NORVASC) 10 MG tablet Take 5 mg by mouth daily.    Yes [provider]  atorvastatin (LIPITOR) 20 MG tablet Take 20 mg by mouth daily.   Yes [provider]  losartan-hydrochlorothiazide (HYZAAR) 100-12.5 MG tablet Take 1 tablet by mouth daily.   Yes [provider]  nebivolol (BYSTOLIC) 5 MG tablet Take 5 mg by mouth daily.   Yes [provider]  tamsulosin (FLOMAX) 0.4 MG CAPS capsule Take 0.4 mg by mouth daily after supper.   Yes [provider]  meclizine (ANTIVERT) 25 MG tablet Take 1 tablet (25 mg total) by mouth 3 (three) times daily as needed for dizziness. 05/06/17   Coral Spikes, DO    Family History Family History  Problem Relation Age of Onset  . Breast cancer Mother   . Hypertension Mother   . Hyperlipidemia Mother   . Heart failure Mother   . Heart attack Father   . Thyroid cancer Sister     Social  History Social History   Tobacco Use  . Smoking status: Former Smoker    Last attempt to quit: 05/07/1967    Years since quitting: 50.0  . Smokeless tobacco: Never Used  Substance Use Topics  . Alcohol use: Yes    Comment: rarely  . Drug use: No     Allergies   Codeine   Review of Systems Review of Systems  Eyes: Negative.   Gastrointestinal: Negative.   Neurological: Positive for dizziness.   Physical Exam Triage Vital Signs ED Triage Vitals [05/06/17 1522]  Enc Vitals Group     BP 129/61     Pulse Rate 70     Resp 16     Temp 98 F (36.7 C)     Temp Source Oral     SpO2 98 %     Weight 216 lb (98 kg)     Height 5\' 10"  (1.778 m)     Head Circumference      Peak Flow      Pain Score 3     Pain Loc      Pain Edu?      Excl. in [place]?    Orthostatic VS for the past 24 hrs:  BP- Lying Pulse- Lying BP- Sitting Pulse- Sitting BP- Standing at 0 minutes Pulse- Standing at 0 minutes  05/06/17 1532 138/71 64 138/83 72 136/68 77   Updated Vital Signs BP 129/61 (BP Location: Left Arm)   Pulse 70   Temp 98 F (36.7 C) (Oral)   Resp 16   Ht 5\' 10"  (1.778 m)   Wt 216 lb (98 kg)   SpO2 98%   BMI 30.99 kg/m   Physical Exam  Constitutional: He is oriented to person, place, and time. He appears well-developed. No distress.  HENT:  Head: Normocephalic and atraumatic.  Mouth/Throat: Oropharynx is clear and moist.  Normal TM's.  Eyes: Conjunctivae and EOM are normal. Pupils are equal, round, and reactive to light.  Cardiovascular: Normal rate and regular rhythm.  Pulmonary/Chest: Effort normal and breath sounds normal. He has no wheezes. He has no rales.  Neurological: He is alert and oriented to person, place, and time.  Negative Marye Round.  Psychiatric: He has a normal mood and affect. His behavior is normal.  Nursing note and vitals reviewed.  UC Treatments / Results  Labs (all labs ordered are listed, but only abnormal results are displayed) Labs Reviewed -  No data to display  EKG  EKG Interpretation None       Radiology No results found.  Procedures Procedures (including critical care time)  Medications Ordered in UC Medications - No data to display   Initial Impression / Assessment and Plan / UC Course  I have reviewed the triage vital signs and the nursing notes.  Pertinent labs & imaging results that were available during my care of the patient were reviewed by me and considered in my medical decision making (see chart for details).     72 year old male presents with vertigo.  Treating with meclizine.  Final Clinical Impressions(s) / UC Diagnoses   Final diagnoses:  Vertigo    ED Discharge Orders        Ordered    meclizine (ANTIVERT) 25 MG tablet  3 times daily PRN     05/06/17 1543     Controlled Substance Prescriptions [place] Controlled Substance Registry consulted? Not Applicable   Coral Spikes, DO 05/06/17 1703

## 2017-05-11 DIAGNOSIS — M5136 Other intervertebral disc degeneration, lumbar region: Secondary | ICD-10-CM | POA: Diagnosis not present

## 2017-05-11 DIAGNOSIS — M5417 Radiculopathy, lumbosacral region: Secondary | ICD-10-CM | POA: Diagnosis not present

## 2017-05-11 DIAGNOSIS — M9905 Segmental and somatic dysfunction of pelvic region: Secondary | ICD-10-CM | POA: Diagnosis not present

## 2017-05-11 DIAGNOSIS — M9903 Segmental and somatic dysfunction of lumbar region: Secondary | ICD-10-CM | POA: Diagnosis not present

## 2017-05-18 DIAGNOSIS — M9905 Segmental and somatic dysfunction of pelvic region: Secondary | ICD-10-CM | POA: Diagnosis not present

## 2017-05-18 DIAGNOSIS — M5136 Other intervertebral disc degeneration, lumbar region: Secondary | ICD-10-CM | POA: Diagnosis not present

## 2017-05-18 DIAGNOSIS — M5417 Radiculopathy, lumbosacral region: Secondary | ICD-10-CM | POA: Diagnosis not present

## 2017-05-18 DIAGNOSIS — M9903 Segmental and somatic dysfunction of lumbar region: Secondary | ICD-10-CM | POA: Diagnosis not present

## 2017-05-19 DIAGNOSIS — R972 Elevated prostate specific antigen [PSA]: Secondary | ICD-10-CM | POA: Diagnosis not present

## 2017-05-23 DIAGNOSIS — G4733 Obstructive sleep apnea (adult) (pediatric): Secondary | ICD-10-CM | POA: Diagnosis not present

## 2017-05-25 DIAGNOSIS — M5136 Other intervertebral disc degeneration, lumbar region: Secondary | ICD-10-CM | POA: Diagnosis not present

## 2017-05-25 DIAGNOSIS — M9905 Segmental and somatic dysfunction of pelvic region: Secondary | ICD-10-CM | POA: Diagnosis not present

## 2017-05-25 DIAGNOSIS — M9903 Segmental and somatic dysfunction of lumbar region: Secondary | ICD-10-CM | POA: Diagnosis not present

## 2017-05-25 DIAGNOSIS — M5417 Radiculopathy, lumbosacral region: Secondary | ICD-10-CM | POA: Diagnosis not present

## 2017-05-27 DIAGNOSIS — R972 Elevated prostate specific antigen [PSA]: Secondary | ICD-10-CM | POA: Diagnosis not present

## 2017-05-27 DIAGNOSIS — R3912 Poor urinary stream: Secondary | ICD-10-CM | POA: Diagnosis not present

## 2017-05-27 DIAGNOSIS — N5201 Erectile dysfunction due to arterial insufficiency: Secondary | ICD-10-CM | POA: Diagnosis not present

## 2017-05-27 DIAGNOSIS — N401 Enlarged prostate with lower urinary tract symptoms: Secondary | ICD-10-CM | POA: Diagnosis not present

## 2017-06-01 DIAGNOSIS — M5417 Radiculopathy, lumbosacral region: Secondary | ICD-10-CM | POA: Diagnosis not present

## 2017-06-01 DIAGNOSIS — M5136 Other intervertebral disc degeneration, lumbar region: Secondary | ICD-10-CM | POA: Diagnosis not present

## 2017-06-01 DIAGNOSIS — M9903 Segmental and somatic dysfunction of lumbar region: Secondary | ICD-10-CM | POA: Diagnosis not present

## 2017-06-01 DIAGNOSIS — M9905 Segmental and somatic dysfunction of pelvic region: Secondary | ICD-10-CM | POA: Diagnosis not present

## 2017-06-22 DIAGNOSIS — G4733 Obstructive sleep apnea (adult) (pediatric): Secondary | ICD-10-CM | POA: Diagnosis not present

## 2017-06-25 ENCOUNTER — Encounter (HOSPITAL_COMMUNITY): Payer: Self-pay | Admitting: Emergency Medicine

## 2017-06-25 ENCOUNTER — Ambulatory Visit (HOSPITAL_COMMUNITY)
Admission: EM | Admit: 2017-06-25 | Discharge: 2017-06-25 | Disposition: A | Discharge status: Home / Self Care | Payer: PPO | Attending: Family Medicine | Admitting: Family Medicine

## 2017-06-25 DIAGNOSIS — N400 Enlarged prostate without lower urinary tract symptoms: Secondary | ICD-10-CM | POA: Diagnosis not present

## 2017-06-25 DIAGNOSIS — Z885 Allergy status to narcotic agent status: Secondary | ICD-10-CM | POA: Insufficient documentation

## 2017-06-25 DIAGNOSIS — Z85828 Personal history of other malignant neoplasm of skin: Secondary | ICD-10-CM | POA: Diagnosis not present

## 2017-06-25 DIAGNOSIS — Z79899 Other long term (current) drug therapy: Secondary | ICD-10-CM | POA: Insufficient documentation

## 2017-06-25 DIAGNOSIS — Z87891 Personal history of nicotine dependence: Secondary | ICD-10-CM | POA: Insufficient documentation

## 2017-06-25 DIAGNOSIS — E785 Hyperlipidemia, unspecified: Secondary | ICD-10-CM | POA: Diagnosis not present

## 2017-06-25 DIAGNOSIS — L03114 Cellulitis of left upper limb: Secondary | ICD-10-CM | POA: Insufficient documentation

## 2017-06-25 DIAGNOSIS — Z7982 Long term (current) use of aspirin: Secondary | ICD-10-CM | POA: Insufficient documentation

## 2017-06-25 DIAGNOSIS — I10 Essential (primary) hypertension: Secondary | ICD-10-CM | POA: Diagnosis not present

## 2017-06-25 LAB — CBC WITH DIFFERENTIAL/PLATELET
Basophils Absolute: 0 10*3/uL (ref 0.0–0.1)
Basophils Relative: 0 %
Eosinophils Absolute: 0.1 10*3/uL (ref 0.0–0.7)
Eosinophils Relative: 1 %
HCT: 43.5 % (ref 39.0–52.0)
Hemoglobin: 14.8 g/dL (ref 13.0–17.0)
Lymphocytes Relative: 10 %
Lymphs Abs: 1.4 10*3/uL (ref 0.7–4.0)
MCH: 30 pg (ref 26.0–34.0)
MCHC: 34 g/dL (ref 30.0–36.0)
MCV: 88.2 fL (ref 78.0–100.0)
Monocytes Absolute: 1.1 10*3/uL — ABNORMAL HIGH (ref 0.1–1.0)
Monocytes Relative: 8 %
Neutro Abs: 10.9 10*3/uL — ABNORMAL HIGH (ref 1.7–7.7)
Neutrophils Relative %: 81 %
Platelets: 164 10*3/uL (ref 150–400)
RBC: 4.93 MIL/uL (ref 4.22–5.81)
RDW: 12.9 % (ref 11.5–15.5)
WBC: 13.5 10*3/uL — ABNORMAL HIGH (ref 4.0–10.5)

## 2017-06-25 MED ORDER — CEFTRIAXONE SODIUM 1 G IJ SOLR
INTRAMUSCULAR | Status: AC
  Filled 2017-06-25: qty 10

## 2017-06-25 MED ORDER — LIDOCAINE HCL (PF) 1 % IJ SOLN
INTRAMUSCULAR | Status: AC
  Filled 2017-06-25: qty 2

## 2017-06-25 MED ORDER — CEFTRIAXONE SODIUM 1 G IJ SOLR
1.0000 g | Freq: Once | INTRAMUSCULAR | Status: AC
  Administered 2017-06-25: 1 g via INTRAMUSCULAR

## 2017-06-25 MED ORDER — SULFAMETHOXAZOLE-TRIMETHOPRIM 800-160 MG PO TABS: 1.0000 | ORAL_TABLET | Freq: Two times a day (BID) | ORAL | 0 refills | Status: AC

## 2017-06-25 NOTE — Discharge Instructions (Signed)
Mark edges Take the septra twice a day Start tonight Take benadryl for itching and swelling See Your PCP or return here in 2-3 days especially if rash does not improve GO TO ER if worse

## 2017-06-25 NOTE — ED Triage Notes (Signed)
Pt c/o mild chills last night, and this morning c/o really bad chills. Pt c/o redness and swelling in his R arm. Large red area noted on R elbow area. Pt states he was running a fever and took tylenol.

## 2017-06-25 NOTE — ED Provider Notes (Signed)
[place]    CSN: [id number] Arrival date & time: 06/25/17  1105     History   Chief Complaint Chief Complaint  Patient presents with  . Arm Pain    HPI Linnie Mcglocklin is a 72 y.o. male.   HPI  No trauma.  Right elbow swelling and redness for 2 days. No injury, no overuse, no prior problems with the elbow.  No bite no wounds no bleeding. He feels like the redness is expanding. He has had some fever and chills. He has very little pain in the area.  Somewhat itchy. No known allergies to any insect bite or local allergic reactions previously. Full range of motion in shoulder elbow and wrist joints with no joint pain. No malaise or body aches.  Normal appetite bowels or digestion.  Past Medical History:  Diagnosis Date  . BPH (benign prostatic hyperplasia)   . Hyperlipidemia   . Hypertension   . Skin cancer    BCC, SCC  . Sleep apnea     There are no active problems to display for this patient.   Past Surgical History:  Procedure Laterality Date  . APPENDECTOMY    . FRACTURE SURGERY    . HERNIA REPAIR     hiatal       Home Medications    Prior to Admission medications   Medication Sig Start Date End Date Taking? Authorizing Provider  ACETAMINOPHEN PO Take by mouth.   Yes [provider]  aspirin EC 81 MG tablet Take 81 mg by mouth daily.   Yes [provider]  Cholecalciferol (VITAMIN D3) 10000 units TABS Take by mouth.   Yes [provider]  COLLAGEN PO Take by mouth.   Yes [provider]  ibuprofen (ADVIL,MOTRIN) 200 MG tablet Take 200 mg by mouth every 6 (six) hours as needed.   Yes [provider]  Multiple Vitamin (MULTIVITAMIN) capsule Take 1 capsule by mouth daily.   Yes [provider]  sildenafil (REVATIO) 20 MG tablet Take 20 mg by mouth 3 (three) times daily.   Yes [provider]  amLODipine (NORVASC) 10 MG tablet Take 5 mg by mouth daily.     [provider]  atorvastatin (LIPITOR) 20 MG tablet Take 20 mg by mouth daily.    [provider]  losartan-hydrochlorothiazide (HYZAAR) 100-12.5 MG tablet Take 1 tablet by mouth daily.    [provider]  meclizine (ANTIVERT) 25 MG tablet Take 1 tablet (25 mg total) by mouth 3 (three) times daily as needed for dizziness. 05/06/17   Coral Spikes, DO  nebivolol (BYSTOLIC) 5 MG tablet Take 5 mg by mouth daily.    [provider]  tamsulosin (FLOMAX) 0.4 MG CAPS capsule Take 0.4 mg by mouth daily after supper.    [provider]    Family History Family History  Problem Relation Age of Onset  . Breast cancer Mother   . Hypertension Mother   . Hyperlipidemia Mother   . Heart failure Mother   . Heart attack Father   . Thyroid cancer Sister     Social History Social History   Tobacco Use  . Smoking status: Former Smoker    Last attempt to quit: 05/07/1967    Years since quitting: 50.1  . Smokeless tobacco: Never Used  Substance Use Topics  . Alcohol use: Yes    Comment: rarely  . Drug use: No     Allergies   Codeine  Review of Systems Review of Systems  Constitutional: Positive for chills and fever. Negative for activity change.  HENT: Negative for congestion and dental problem.   Eyes: Negative for photophobia and visual disturbance.  Respiratory: Negative for cough and shortness of breath.   Cardiovascular: Negative for chest pain and palpitations.  Gastrointestinal: Negative for abdominal distention, nausea and vomiting.  Genitourinary: Negative for difficulty urinating and frequency.  Musculoskeletal: Negative for arthralgias and back pain.  Skin: Positive for color change.  Neurological: Negative for dizziness and headaches.  Psychiatric/Behavioral: Negative for behavioral problems and sleep disturbance.     Physical Exam Triage Vital Signs ED Triage Vitals [06/25/17 1129]  Enc Vitals Group     BP (!) 142/67     Pulse Rate 77     Resp  18     Temp 98.2 F (36.8 C)     Temp src      SpO2 94 %     Weight      Height      Head Circumference      Peak Flow      Pain Score      Pain Loc      Pain Edu?      Excl. in [place]?    No data found.  Updated Vital Signs BP (!) 142/67   Pulse 77   Temp 98.2 F (36.8 C)   Resp 18   SpO2 94%       Physical Exam  Constitutional: He appears well-developed and well-nourished.  HENT:  Head: Normocephalic and atraumatic.  Mouth/Throat: Oropharynx is clear and moist.  Eyes: Pupils are equal, round, and reactive to light. Conjunctivae are normal.  Neck: Normal range of motion. Neck supple.  Cardiovascular: Normal rate and regular rhythm.  No murmur heard. Pulmonary/Chest: Effort normal and breath sounds normal. No respiratory distress.  Abdominal: Soft. There is no tenderness.  Musculoskeletal: He exhibits no edema.       Arms: Lymphadenopathy:    He has no cervical adenopathy.    He has no axillary adenopathy.       Right: No epitrochlear adenopathy present.  Neurological: He is alert.  Skin: Skin is warm and dry.  Psychiatric: He has a normal mood and affect.  Nursing note and vitals reviewed.    UC Treatments / Results  Labs (all labs ordered are listed, but only abnormal results are displayed) Labs Reviewed  CBC WITH DIFFERENTIAL/PLATELET   Results for orders placed or performed during the hospital encounter of 06/25/17  CBC with Differential  Result Value Ref Range   WBC 13.5 (H) 4.0 - 10.5 K/uL   RBC 4.93 4.22 - 5.81 MIL/uL   Hemoglobin 14.8 13.0 - 17.0 g/dL   HCT 43.5 39.0 - 52.0 %   MCV 88.2 78.0 - 100.0 fL   MCH 30.0 26.0 - 34.0 pg   MCHC 34.0 30.0 - 36.0 g/dL   RDW 12.9 11.5 - 15.5 %   Platelets 164 150 - 400 K/uL   Neutrophils Relative % 81 %   Neutro Abs 10.9 (H) 1.7 - 7.7 K/uL   Lymphocytes Relative 10 %   Lymphs Abs 1.4 0.7 - 4.0 K/uL   Monocytes Relative 8 %   Monocytes Absolute 1.1 (H) 0.1 - 1.0 K/uL   Eosinophils Relative 1 %    Eosinophils Absolute 0.1 0.0 - 0.7 K/uL   Basophils Relative 0 %   Basophils Absolute 0.0 0.0 - 0.1 K/uL      Initial  Impression / Assessment and Plan / UC Course  I have reviewed the triage vital signs and the nursing notes.  Pertinent labs & imaging results that were available during my care of the patient were reviewed by me and considered in my medical decision making (see chart for details).     I discussed with the patient that clinically this looks more like a local allergic reaction perhaps from an insect bite.  Cellulitis, especially with fever and chills, of the size is usually painful.  I did do a CBC in order to help with the differential diagnosis.  The white count is high.  I am going to treat him with an antibiotic and check him back in a couple days. Final Clinical Impressions(s) / UC Diagnoses   Final diagnoses:  Cellulitis of left upper extremity   Discharge Instructions   None    ED Prescriptions    None     Controlled Substance Prescriptions [place] Controlled Substance Registry consulted? Not Applicable   Raylene Everts, MD 06/25/17 1925

## 2017-06-29 DIAGNOSIS — M5417 Radiculopathy, lumbosacral region: Secondary | ICD-10-CM | POA: Diagnosis not present

## 2017-06-29 DIAGNOSIS — M5136 Other intervertebral disc degeneration, lumbar region: Secondary | ICD-10-CM | POA: Diagnosis not present

## 2017-06-29 DIAGNOSIS — M9905 Segmental and somatic dysfunction of pelvic region: Secondary | ICD-10-CM | POA: Diagnosis not present

## 2017-06-29 DIAGNOSIS — M9903 Segmental and somatic dysfunction of lumbar region: Secondary | ICD-10-CM | POA: Diagnosis not present

## 2017-07-15 DIAGNOSIS — L578 Other skin changes due to chronic exposure to nonionizing radiation: Secondary | ICD-10-CM | POA: Diagnosis not present

## 2017-07-15 DIAGNOSIS — L821 Other seborrheic keratosis: Secondary | ICD-10-CM | POA: Diagnosis not present

## 2017-07-15 DIAGNOSIS — Z85828 Personal history of other malignant neoplasm of skin: Secondary | ICD-10-CM | POA: Diagnosis not present

## 2017-07-15 DIAGNOSIS — Z872 Personal history of diseases of the skin and subcutaneous tissue: Secondary | ICD-10-CM | POA: Diagnosis not present

## 2017-07-15 DIAGNOSIS — L570 Actinic keratosis: Secondary | ICD-10-CM | POA: Diagnosis not present

## 2017-07-27 DIAGNOSIS — M9903 Segmental and somatic dysfunction of lumbar region: Secondary | ICD-10-CM | POA: Diagnosis not present

## 2017-07-27 DIAGNOSIS — M5417 Radiculopathy, lumbosacral region: Secondary | ICD-10-CM | POA: Diagnosis not present

## 2017-07-27 DIAGNOSIS — M5136 Other intervertebral disc degeneration, lumbar region: Secondary | ICD-10-CM | POA: Diagnosis not present

## 2017-07-27 DIAGNOSIS — M9905 Segmental and somatic dysfunction of pelvic region: Secondary | ICD-10-CM | POA: Diagnosis not present

## 2017-08-17 DIAGNOSIS — H18413 Arcus senilis, bilateral: Secondary | ICD-10-CM | POA: Diagnosis not present

## 2017-08-17 DIAGNOSIS — H25043 Posterior subcapsular polar age-related cataract, bilateral: Secondary | ICD-10-CM | POA: Diagnosis not present

## 2017-08-17 DIAGNOSIS — H2513 Age-related nuclear cataract, bilateral: Secondary | ICD-10-CM | POA: Diagnosis not present

## 2017-08-17 DIAGNOSIS — H25013 Cortical age-related cataract, bilateral: Secondary | ICD-10-CM | POA: Diagnosis not present

## 2017-08-17 DIAGNOSIS — H2512 Age-related nuclear cataract, left eye: Secondary | ICD-10-CM | POA: Diagnosis not present

## 2017-08-24 DIAGNOSIS — M5136 Other intervertebral disc degeneration, lumbar region: Secondary | ICD-10-CM | POA: Diagnosis not present

## 2017-08-24 DIAGNOSIS — M9903 Segmental and somatic dysfunction of lumbar region: Secondary | ICD-10-CM | POA: Diagnosis not present

## 2017-08-24 DIAGNOSIS — M9905 Segmental and somatic dysfunction of pelvic region: Secondary | ICD-10-CM | POA: Diagnosis not present

## 2017-08-24 DIAGNOSIS — M5417 Radiculopathy, lumbosacral region: Secondary | ICD-10-CM | POA: Diagnosis not present

## 2017-09-28 DIAGNOSIS — Z79899 Other long term (current) drug therapy: Secondary | ICD-10-CM | POA: Diagnosis not present

## 2017-09-28 DIAGNOSIS — R739 Hyperglycemia, unspecified: Secondary | ICD-10-CM | POA: Diagnosis not present

## 2017-09-28 DIAGNOSIS — E7800 Pure hypercholesterolemia, unspecified: Secondary | ICD-10-CM | POA: Diagnosis not present

## 2017-09-29 DIAGNOSIS — M5136 Other intervertebral disc degeneration, lumbar region: Secondary | ICD-10-CM | POA: Diagnosis not present

## 2017-09-29 DIAGNOSIS — M5417 Radiculopathy, lumbosacral region: Secondary | ICD-10-CM | POA: Diagnosis not present

## 2017-09-29 DIAGNOSIS — M9903 Segmental and somatic dysfunction of lumbar region: Secondary | ICD-10-CM | POA: Diagnosis not present

## 2017-09-29 DIAGNOSIS — M9905 Segmental and somatic dysfunction of pelvic region: Secondary | ICD-10-CM | POA: Diagnosis not present

## 2017-10-05 DIAGNOSIS — Z0000 Encounter for general adult medical examination without abnormal findings: Secondary | ICD-10-CM | POA: Diagnosis not present

## 2017-11-01 DIAGNOSIS — H2512 Age-related nuclear cataract, left eye: Secondary | ICD-10-CM | POA: Diagnosis not present

## 2017-11-02 DIAGNOSIS — H2511 Age-related nuclear cataract, right eye: Secondary | ICD-10-CM | POA: Diagnosis not present

## 2017-11-09 DIAGNOSIS — Z9989 Dependence on other enabling machines and devices: Secondary | ICD-10-CM | POA: Diagnosis not present

## 2017-11-09 DIAGNOSIS — I499 Cardiac arrhythmia, unspecified: Secondary | ICD-10-CM | POA: Diagnosis not present

## 2017-11-09 DIAGNOSIS — R600 Localized edema: Secondary | ICD-10-CM | POA: Diagnosis not present

## 2017-11-09 DIAGNOSIS — G4733 Obstructive sleep apnea (adult) (pediatric): Secondary | ICD-10-CM | POA: Diagnosis not present

## 2017-11-09 DIAGNOSIS — I10 Essential (primary) hypertension: Secondary | ICD-10-CM | POA: Diagnosis not present

## 2017-11-09 DIAGNOSIS — R0609 Other forms of dyspnea: Secondary | ICD-10-CM | POA: Diagnosis not present

## 2017-11-12 DIAGNOSIS — M5136 Other intervertebral disc degeneration, lumbar region: Secondary | ICD-10-CM | POA: Diagnosis not present

## 2017-11-12 DIAGNOSIS — M9903 Segmental and somatic dysfunction of lumbar region: Secondary | ICD-10-CM | POA: Diagnosis not present

## 2017-11-12 DIAGNOSIS — M5417 Radiculopathy, lumbosacral region: Secondary | ICD-10-CM | POA: Diagnosis not present

## 2017-11-12 DIAGNOSIS — M9905 Segmental and somatic dysfunction of pelvic region: Secondary | ICD-10-CM | POA: Diagnosis not present

## 2017-11-22 DIAGNOSIS — H2511 Age-related nuclear cataract, right eye: Secondary | ICD-10-CM | POA: Diagnosis not present

## 2017-12-07 DIAGNOSIS — H26492 Other secondary cataract, left eye: Secondary | ICD-10-CM | POA: Diagnosis not present

## 2017-12-10 DIAGNOSIS — M9903 Segmental and somatic dysfunction of lumbar region: Secondary | ICD-10-CM | POA: Diagnosis not present

## 2017-12-10 DIAGNOSIS — M5417 Radiculopathy, lumbosacral region: Secondary | ICD-10-CM | POA: Diagnosis not present

## 2017-12-10 DIAGNOSIS — M9905 Segmental and somatic dysfunction of pelvic region: Secondary | ICD-10-CM | POA: Diagnosis not present

## 2017-12-10 DIAGNOSIS — M5136 Other intervertebral disc degeneration, lumbar region: Secondary | ICD-10-CM | POA: Diagnosis not present

## 2018-01-14 DIAGNOSIS — M5417 Radiculopathy, lumbosacral region: Secondary | ICD-10-CM | POA: Diagnosis not present

## 2018-01-14 DIAGNOSIS — M5136 Other intervertebral disc degeneration, lumbar region: Secondary | ICD-10-CM | POA: Diagnosis not present

## 2018-01-14 DIAGNOSIS — M9903 Segmental and somatic dysfunction of lumbar region: Secondary | ICD-10-CM | POA: Diagnosis not present

## 2018-01-14 DIAGNOSIS — M9905 Segmental and somatic dysfunction of pelvic region: Secondary | ICD-10-CM | POA: Diagnosis not present

## 2018-01-17 DIAGNOSIS — H26491 Other secondary cataract, right eye: Secondary | ICD-10-CM | POA: Diagnosis not present

## 2018-02-18 ENCOUNTER — Other Ambulatory Visit: Payer: Self-pay

## 2018-02-18 ENCOUNTER — Encounter: Payer: Self-pay | Admitting: Emergency Medicine

## 2018-02-18 ENCOUNTER — Ambulatory Visit
Admission: EM | Admit: 2018-02-18 | Discharge: 2018-02-18 | Disposition: A | Discharge status: Home / Self Care | Payer: PPO | Attending: Family Medicine | Admitting: Family Medicine

## 2018-02-18 DIAGNOSIS — M9905 Segmental and somatic dysfunction of pelvic region: Secondary | ICD-10-CM | POA: Diagnosis not present

## 2018-02-18 DIAGNOSIS — R05 Cough: Secondary | ICD-10-CM | POA: Diagnosis not present

## 2018-02-18 DIAGNOSIS — M9903 Segmental and somatic dysfunction of lumbar region: Secondary | ICD-10-CM | POA: Diagnosis not present

## 2018-02-18 DIAGNOSIS — M5417 Radiculopathy, lumbosacral region: Secondary | ICD-10-CM | POA: Diagnosis not present

## 2018-02-18 DIAGNOSIS — R059 Cough, unspecified: Secondary | ICD-10-CM

## 2018-02-18 DIAGNOSIS — I10 Essential (primary) hypertension: Secondary | ICD-10-CM

## 2018-02-18 DIAGNOSIS — M5136 Other intervertebral disc degeneration, lumbar region: Secondary | ICD-10-CM | POA: Diagnosis not present

## 2018-02-18 MED ORDER — HYDROCOD POLST-CPM POLST ER 10-8 MG/5ML PO SUER: 5.0000 mL | Freq: Every evening | ORAL | 0 refills | Status: DC | PRN

## 2018-02-18 MED ORDER — BENZONATATE 100 MG PO CAPS: 100.0000 mg | ORAL_CAPSULE | Freq: Three times a day (TID) | ORAL | 0 refills | Status: DC | PRN

## 2018-02-18 NOTE — Discharge Instructions (Signed)
This is viral.  Rest.  Tessalon perles during the day. Tussionex at night.  Take care  Dr. Lacinda Axon

## 2018-02-18 NOTE — ED Triage Notes (Signed)
Patient c/o cough and congestion, low grade fever that started on Monday.

## 2018-02-18 NOTE — ED Provider Notes (Signed)
[facility]    CSN: [id number] Arrival date & time: 02/18/18  1139  History   Chief Complaint Chief Complaint  Patient presents with  . Cough   HPI  72 year old male presents with cough.  Patient reports he has had a cough since Monday.  He reports low-grade fever.  He is afebrile currently.  The highest temperature he has seen was 99.8.  He has been taking Tylenol and Delsym without resolution.  No reports of shortness of breath.  No known exacerbating factors.  No other associated symptoms.  No other complaints.  PMH, Surgical Hx, Family Hx, Social History reviewed and updated as below.  Past Medical History:  Diagnosis Date  . BPH (benign prostatic hyperplasia)   . Hyperlipidemia   . Hypertension   . Skin cancer    BCC, SCC  . Sleep apnea    Past Surgical History:  Procedure Laterality Date  . APPENDECTOMY    . CATARACT EXTRACTION    . FRACTURE SURGERY    . HERNIA REPAIR     hiatal   Home Medications    Prior to Admission medications   Medication Sig Start Date End Date Taking? Authorizing Provider  ACETAMINOPHEN PO Take by mouth.   Yes [provider]  amLODipine (NORVASC) 5 MG tablet Take 5 mg by mouth daily.   Yes [provider]  aspirin EC 81 MG tablet Take 81 mg by mouth daily.   Yes [provider]  atorvastatin (LIPITOR) 20 MG tablet Take 20 mg by mouth daily.   Yes [provider]  Cholecalciferol (VITAMIN D3) 10000 units TABS Take by mouth.   Yes [provider]  COLLAGEN PO Take by mouth.   Yes [provider]  ibuprofen (ADVIL,MOTRIN) 200 MG tablet Take 200 mg by mouth every 6 (six) hours as needed.   Yes [provider]  losartan-hydrochlorothiazide (HYZAAR) 100-12.5 MG tablet Take 1 tablet by mouth daily.   Yes [provider]  meclizine (ANTIVERT) 25 MG tablet Take 1 tablet (25 mg total) by mouth 3 (three) times daily as needed for dizziness. 05/06/17  Yes Coral Spikes, DO  Multiple Vitamin (MULTIVITAMIN) capsule Take 1 capsule by mouth daily.   Yes [provider]  nebivolol (BYSTOLIC) 5 MG tablet Take 5 mg by mouth daily.   Yes [provider]  sildenafil (REVATIO) 20 MG tablet Take 20 mg by mouth 3 (three) times daily.   Yes [provider]  tamsulosin (FLOMAX) 0.4 MG CAPS capsule Take 0.4 mg by mouth daily after supper.   Yes [provider]  benzonatate (TESSALON) 100 MG capsule Take 1 capsule (100 mg total) by mouth 3 (three) times daily as needed. 02/18/18   Coral Spikes, DO  chlorpheniramine-HYDROcodone (TUSSIONEX PENNKINETIC ER) 10-8 MG/5ML SUER Take 5 mLs by mouth at bedtime as needed. 02/18/18   Coral Spikes, DO    Family History Family History  Problem Relation Age of Onset  . Breast cancer Mother   . Hypertension Mother   . Hyperlipidemia Mother   . Heart failure Mother   . Heart attack Father   . Thyroid cancer Sister     Social History Social History   Tobacco Use  . Smoking status: Former Smoker    Last attempt to quit: 05/07/1967    Years since quitting: 50.8  . Smokeless tobacco: Never Used  Substance Use Topics  . Alcohol use: Yes    Comment: rarely  . Drug  use: No     Allergies   Codeine   Review of Systems Review of Systems  Constitutional: Positive for fever.  Respiratory: Positive for cough. Negative for shortness of breath.    Physical Exam Triage Vital Signs ED Triage Vitals  Enc Vitals Group     BP 02/18/18 1225 (!) 141/75     Pulse Rate 02/18/18 1225 62     Resp 02/18/18 1225 18     Temp 02/18/18 1225 98 F (36.7 C)     Temp Source 02/18/18 1225 Oral     SpO2 02/18/18 1225 99 %     Weight 02/18/18 1222 225 lb (102.1 kg)     Height 02/18/18 1222 5\' 11"  (1.803 m)     Head Circumference --      Peak Flow --      Pain Score 02/18/18 1222 0     Pain Loc --      Pain Edu? --      Excl. in [place]? --    Updated Vital Signs BP (!) 141/75 (BP Location: Left Arm)    Pulse 62   Temp 98 F (36.7 C) (Oral)   Resp 18   Ht 5\' 11"  (1.803 m)   Wt 102.1 kg   SpO2 99%   BMI 31.38 kg/m   Visual Acuity Right Eye Distance:   Left Eye Distance:   Bilateral Distance:    Right Eye Near:   Left Eye Near:    Bilateral Near:     Physical Exam Vitals signs and nursing note reviewed.  Constitutional:      General: He is not in acute distress.    Appearance: Normal appearance.  HENT:     Head: Normocephalic and atraumatic.     Right Ear: Tympanic membrane normal.     Left Ear: Tympanic membrane normal.     Mouth/Throat:     Pharynx: Oropharynx is clear. No posterior oropharyngeal erythema.  Eyes:     General:        Right eye: No discharge.        Left eye: No discharge.     Conjunctiva/sclera: Conjunctivae normal.  Cardiovascular:     Rate and Rhythm: Normal rate and regular rhythm.  Pulmonary:     Effort: Pulmonary effort is normal.     Breath sounds: Normal breath sounds. No wheezing, rhonchi or rales.  Neurological:     Mental Status: He is alert.  Psychiatric:        Mood and Affect: Mood normal.        Behavior: Behavior normal.    UC Treatments / Results  Labs (all labs ordered are listed, but only abnormal results are displayed) Labs Reviewed - No data to display  EKG None  Radiology No results found.  Procedures Procedures (including critical care time)  Medications Ordered in UC Medications - No data to display  Initial Impression / Assessment and Plan / UC Course  I have reviewed the triage vital signs and the nursing notes.  Pertinent labs & imaging results that were available during my care of the patient were reviewed by me and considered in my medical decision making (see chart for details).    72 year old male presents with cough.  Likely viral.  Tessalon Perles and Tussionex as needed.  Supportive care.  Final Clinical Impressions(s) / UC Diagnoses   Final diagnoses:  Cough     Discharge Instructions      This is viral.  Rest.  Tessalon perles during the day. Tussionex at night.  Take care  Dr. Lacinda Axon    ED Prescriptions    Medication Sig Dispense Auth. Provider   benzonatate (TESSALON) 100 MG capsule Take 1 capsule (100 mg total) by mouth 3 (three) times daily as needed. 30 capsule Lone Jack, Garibaldi G, DO   chlorpheniramine-HYDROcodone (TUSSIONEX PENNKINETIC ER) 10-8 MG/5ML SUER Take 5 mLs by mouth at bedtime as needed. 60 mL Coral Spikes, DO     Controlled Substance Prescriptions [place] Controlled Substance Registry consulted? Not Applicable   Coral Spikes, DO 02/18/18 1312

## 2018-03-18 DIAGNOSIS — M9905 Segmental and somatic dysfunction of pelvic region: Secondary | ICD-10-CM | POA: Diagnosis not present

## 2018-03-18 DIAGNOSIS — M5136 Other intervertebral disc degeneration, lumbar region: Secondary | ICD-10-CM | POA: Diagnosis not present

## 2018-03-18 DIAGNOSIS — M5417 Radiculopathy, lumbosacral region: Secondary | ICD-10-CM | POA: Diagnosis not present

## 2018-03-18 DIAGNOSIS — M9903 Segmental and somatic dysfunction of lumbar region: Secondary | ICD-10-CM | POA: Diagnosis not present

## 2018-03-29 DIAGNOSIS — H18413 Arcus senilis, bilateral: Secondary | ICD-10-CM | POA: Diagnosis not present

## 2018-03-29 DIAGNOSIS — H40013 Open angle with borderline findings, low risk, bilateral: Secondary | ICD-10-CM | POA: Diagnosis not present

## 2018-03-29 DIAGNOSIS — H26491 Other secondary cataract, right eye: Secondary | ICD-10-CM | POA: Diagnosis not present

## 2018-03-29 DIAGNOSIS — Z961 Presence of intraocular lens: Secondary | ICD-10-CM | POA: Diagnosis not present

## 2018-04-20 DIAGNOSIS — E7800 Pure hypercholesterolemia, unspecified: Secondary | ICD-10-CM | POA: Diagnosis not present

## 2018-04-20 DIAGNOSIS — R739 Hyperglycemia, unspecified: Secondary | ICD-10-CM | POA: Diagnosis not present

## 2018-04-20 DIAGNOSIS — Z79899 Other long term (current) drug therapy: Secondary | ICD-10-CM | POA: Diagnosis not present

## 2018-04-22 DIAGNOSIS — M9903 Segmental and somatic dysfunction of lumbar region: Secondary | ICD-10-CM | POA: Diagnosis not present

## 2018-04-22 DIAGNOSIS — M5136 Other intervertebral disc degeneration, lumbar region: Secondary | ICD-10-CM | POA: Diagnosis not present

## 2018-04-22 DIAGNOSIS — M5417 Radiculopathy, lumbosacral region: Secondary | ICD-10-CM | POA: Diagnosis not present

## 2018-04-22 DIAGNOSIS — M9905 Segmental and somatic dysfunction of pelvic region: Secondary | ICD-10-CM | POA: Diagnosis not present

## 2018-04-25 DIAGNOSIS — G4733 Obstructive sleep apnea (adult) (pediatric): Secondary | ICD-10-CM | POA: Diagnosis not present

## 2018-04-25 DIAGNOSIS — I10 Essential (primary) hypertension: Secondary | ICD-10-CM | POA: Diagnosis not present

## 2018-04-25 DIAGNOSIS — Z79899 Other long term (current) drug therapy: Secondary | ICD-10-CM | POA: Diagnosis not present

## 2018-04-25 DIAGNOSIS — E7800 Pure hypercholesterolemia, unspecified: Secondary | ICD-10-CM | POA: Diagnosis not present

## 2018-04-25 DIAGNOSIS — Z23 Encounter for immunization: Secondary | ICD-10-CM | POA: Diagnosis not present

## 2018-04-25 DIAGNOSIS — Z125 Encounter for screening for malignant neoplasm of prostate: Secondary | ICD-10-CM | POA: Diagnosis not present

## 2018-04-25 DIAGNOSIS — R739 Hyperglycemia, unspecified: Secondary | ICD-10-CM | POA: Diagnosis not present

## 2018-05-23 DIAGNOSIS — M9903 Segmental and somatic dysfunction of lumbar region: Secondary | ICD-10-CM | POA: Diagnosis not present

## 2018-05-23 DIAGNOSIS — M5417 Radiculopathy, lumbosacral region: Secondary | ICD-10-CM | POA: Diagnosis not present

## 2018-05-23 DIAGNOSIS — M5136 Other intervertebral disc degeneration, lumbar region: Secondary | ICD-10-CM | POA: Diagnosis not present

## 2018-05-23 DIAGNOSIS — M9905 Segmental and somatic dysfunction of pelvic region: Secondary | ICD-10-CM | POA: Diagnosis not present

## 2018-05-25 DIAGNOSIS — M5417 Radiculopathy, lumbosacral region: Secondary | ICD-10-CM | POA: Diagnosis not present

## 2018-05-25 DIAGNOSIS — Z87891 Personal history of nicotine dependence: Secondary | ICD-10-CM | POA: Diagnosis not present

## 2018-05-25 DIAGNOSIS — M9903 Segmental and somatic dysfunction of lumbar region: Secondary | ICD-10-CM | POA: Diagnosis not present

## 2018-05-25 DIAGNOSIS — M5136 Other intervertebral disc degeneration, lumbar region: Secondary | ICD-10-CM | POA: Diagnosis not present

## 2018-05-25 DIAGNOSIS — M9905 Segmental and somatic dysfunction of pelvic region: Secondary | ICD-10-CM | POA: Diagnosis not present

## 2018-05-27 DIAGNOSIS — M25461 Effusion, right knee: Secondary | ICD-10-CM | POA: Diagnosis not present

## 2018-05-27 DIAGNOSIS — M5417 Radiculopathy, lumbosacral region: Secondary | ICD-10-CM | POA: Diagnosis not present

## 2018-05-27 DIAGNOSIS — M5136 Other intervertebral disc degeneration, lumbar region: Secondary | ICD-10-CM | POA: Diagnosis not present

## 2018-05-27 DIAGNOSIS — Z86718 Personal history of other venous thrombosis and embolism: Secondary | ICD-10-CM | POA: Diagnosis not present

## 2018-05-27 DIAGNOSIS — M9905 Segmental and somatic dysfunction of pelvic region: Secondary | ICD-10-CM | POA: Diagnosis not present

## 2018-05-27 DIAGNOSIS — M9903 Segmental and somatic dysfunction of lumbar region: Secondary | ICD-10-CM | POA: Diagnosis not present

## 2018-05-27 DIAGNOSIS — Z66 Do not resuscitate: Secondary | ICD-10-CM | POA: Diagnosis not present

## 2018-05-30 DIAGNOSIS — M9905 Segmental and somatic dysfunction of pelvic region: Secondary | ICD-10-CM | POA: Diagnosis not present

## 2018-05-30 DIAGNOSIS — M9903 Segmental and somatic dysfunction of lumbar region: Secondary | ICD-10-CM | POA: Diagnosis not present

## 2018-05-30 DIAGNOSIS — M5417 Radiculopathy, lumbosacral region: Secondary | ICD-10-CM | POA: Diagnosis not present

## 2018-05-30 DIAGNOSIS — M5136 Other intervertebral disc degeneration, lumbar region: Secondary | ICD-10-CM | POA: Diagnosis not present

## 2018-06-07 DIAGNOSIS — M5136 Other intervertebral disc degeneration, lumbar region: Secondary | ICD-10-CM | POA: Diagnosis not present

## 2018-06-07 DIAGNOSIS — M5417 Radiculopathy, lumbosacral region: Secondary | ICD-10-CM | POA: Diagnosis not present

## 2018-06-07 DIAGNOSIS — M9905 Segmental and somatic dysfunction of pelvic region: Secondary | ICD-10-CM | POA: Diagnosis not present

## 2018-06-07 DIAGNOSIS — M9903 Segmental and somatic dysfunction of lumbar region: Secondary | ICD-10-CM | POA: Diagnosis not present

## 2018-06-21 DIAGNOSIS — M5417 Radiculopathy, lumbosacral region: Secondary | ICD-10-CM | POA: Diagnosis not present

## 2018-06-21 DIAGNOSIS — M5136 Other intervertebral disc degeneration, lumbar region: Secondary | ICD-10-CM | POA: Diagnosis not present

## 2018-06-21 DIAGNOSIS — M9903 Segmental and somatic dysfunction of lumbar region: Secondary | ICD-10-CM | POA: Diagnosis not present

## 2018-06-21 DIAGNOSIS — M9905 Segmental and somatic dysfunction of pelvic region: Secondary | ICD-10-CM | POA: Diagnosis not present

## 2018-07-04 DIAGNOSIS — H40013 Open angle with borderline findings, low risk, bilateral: Secondary | ICD-10-CM | POA: Diagnosis not present

## 2018-07-05 DIAGNOSIS — M5417 Radiculopathy, lumbosacral region: Secondary | ICD-10-CM | POA: Diagnosis not present

## 2018-07-05 DIAGNOSIS — M9905 Segmental and somatic dysfunction of pelvic region: Secondary | ICD-10-CM | POA: Diagnosis not present

## 2018-07-05 DIAGNOSIS — M9903 Segmental and somatic dysfunction of lumbar region: Secondary | ICD-10-CM | POA: Diagnosis not present

## 2018-07-05 DIAGNOSIS — M5136 Other intervertebral disc degeneration, lumbar region: Secondary | ICD-10-CM | POA: Diagnosis not present

## 2018-07-22 DIAGNOSIS — M9905 Segmental and somatic dysfunction of pelvic region: Secondary | ICD-10-CM | POA: Diagnosis not present

## 2018-07-22 DIAGNOSIS — M5417 Radiculopathy, lumbosacral region: Secondary | ICD-10-CM | POA: Diagnosis not present

## 2018-07-22 DIAGNOSIS — M9903 Segmental and somatic dysfunction of lumbar region: Secondary | ICD-10-CM | POA: Diagnosis not present

## 2018-07-22 DIAGNOSIS — M5136 Other intervertebral disc degeneration, lumbar region: Secondary | ICD-10-CM | POA: Diagnosis not present

## 2018-07-25 DIAGNOSIS — Z872 Personal history of diseases of the skin and subcutaneous tissue: Secondary | ICD-10-CM | POA: Diagnosis not present

## 2018-07-25 DIAGNOSIS — L821 Other seborrheic keratosis: Secondary | ICD-10-CM | POA: Diagnosis not present

## 2018-07-25 DIAGNOSIS — Z85828 Personal history of other malignant neoplasm of skin: Secondary | ICD-10-CM | POA: Diagnosis not present

## 2018-07-25 DIAGNOSIS — L578 Other skin changes due to chronic exposure to nonionizing radiation: Secondary | ICD-10-CM | POA: Diagnosis not present

## 2018-07-25 DIAGNOSIS — L570 Actinic keratosis: Secondary | ICD-10-CM | POA: Diagnosis not present

## 2018-08-12 DIAGNOSIS — M9903 Segmental and somatic dysfunction of lumbar region: Secondary | ICD-10-CM | POA: Diagnosis not present

## 2018-08-12 DIAGNOSIS — M5136 Other intervertebral disc degeneration, lumbar region: Secondary | ICD-10-CM | POA: Diagnosis not present

## 2018-08-12 DIAGNOSIS — M9905 Segmental and somatic dysfunction of pelvic region: Secondary | ICD-10-CM | POA: Diagnosis not present

## 2018-08-12 DIAGNOSIS — M5417 Radiculopathy, lumbosacral region: Secondary | ICD-10-CM | POA: Diagnosis not present

## 2018-08-24 DIAGNOSIS — R972 Elevated prostate specific antigen [PSA]: Secondary | ICD-10-CM | POA: Diagnosis not present

## 2018-08-29 DIAGNOSIS — N5201 Erectile dysfunction due to arterial insufficiency: Secondary | ICD-10-CM | POA: Diagnosis not present

## 2018-08-29 DIAGNOSIS — N401 Enlarged prostate with lower urinary tract symptoms: Secondary | ICD-10-CM | POA: Diagnosis not present

## 2018-08-29 DIAGNOSIS — R972 Elevated prostate specific antigen [PSA]: Secondary | ICD-10-CM | POA: Diagnosis not present

## 2018-09-01 DIAGNOSIS — R3912 Poor urinary stream: Secondary | ICD-10-CM | POA: Diagnosis not present

## 2018-09-01 DIAGNOSIS — R350 Frequency of micturition: Secondary | ICD-10-CM | POA: Diagnosis not present

## 2018-09-01 DIAGNOSIS — N401 Enlarged prostate with lower urinary tract symptoms: Secondary | ICD-10-CM | POA: Diagnosis not present

## 2018-09-02 DIAGNOSIS — M9903 Segmental and somatic dysfunction of lumbar region: Secondary | ICD-10-CM | POA: Diagnosis not present

## 2018-09-02 DIAGNOSIS — M9905 Segmental and somatic dysfunction of pelvic region: Secondary | ICD-10-CM | POA: Diagnosis not present

## 2018-09-02 DIAGNOSIS — M5417 Radiculopathy, lumbosacral region: Secondary | ICD-10-CM | POA: Diagnosis not present

## 2018-09-02 DIAGNOSIS — M5136 Other intervertebral disc degeneration, lumbar region: Secondary | ICD-10-CM | POA: Diagnosis not present

## 2018-09-06 DIAGNOSIS — M5417 Radiculopathy, lumbosacral region: Secondary | ICD-10-CM | POA: Diagnosis not present

## 2018-09-06 DIAGNOSIS — M9905 Segmental and somatic dysfunction of pelvic region: Secondary | ICD-10-CM | POA: Diagnosis not present

## 2018-09-06 DIAGNOSIS — M9903 Segmental and somatic dysfunction of lumbar region: Secondary | ICD-10-CM | POA: Diagnosis not present

## 2018-09-06 DIAGNOSIS — M5136 Other intervertebral disc degeneration, lumbar region: Secondary | ICD-10-CM | POA: Diagnosis not present

## 2018-09-23 DIAGNOSIS — M9903 Segmental and somatic dysfunction of lumbar region: Secondary | ICD-10-CM | POA: Diagnosis not present

## 2018-09-23 DIAGNOSIS — M9905 Segmental and somatic dysfunction of pelvic region: Secondary | ICD-10-CM | POA: Diagnosis not present

## 2018-09-23 DIAGNOSIS — M5136 Other intervertebral disc degeneration, lumbar region: Secondary | ICD-10-CM | POA: Diagnosis not present

## 2018-09-23 DIAGNOSIS — M5417 Radiculopathy, lumbosacral region: Secondary | ICD-10-CM | POA: Diagnosis not present

## 2018-10-03 ENCOUNTER — Ambulatory Visit
Admission: EM | Admit: 2018-10-03 | Discharge: 2018-10-03 | Disposition: A | Discharge status: Home / Self Care | Payer: PPO | Attending: Family Medicine | Admitting: Family Medicine

## 2018-10-03 ENCOUNTER — Other Ambulatory Visit: Payer: Self-pay

## 2018-10-03 DIAGNOSIS — S91114A Laceration without foreign body of right lesser toe(s) without damage to nail, initial encounter: Secondary | ICD-10-CM

## 2018-10-03 DIAGNOSIS — Z23 Encounter for immunization: Secondary | ICD-10-CM | POA: Diagnosis not present

## 2018-10-03 DIAGNOSIS — W228XXA Striking against or struck by other objects, initial encounter: Secondary | ICD-10-CM | POA: Diagnosis not present

## 2018-10-03 MED ORDER — TETANUS-DIPHTH-ACELL PERTUSSIS 5-2.5-18.5 LF-MCG/0.5 IM SUSP
0.5000 mL | Freq: Once | INTRAMUSCULAR | Status: AC
  Administered 2018-10-03: 12:00:00 0.5 mL via INTRAMUSCULAR

## 2018-10-03 MED ORDER — CEPHALEXIN 500 MG PO CAPS: 500.0000 mg | ORAL_CAPSULE | Freq: Three times a day (TID) | ORAL | 0 refills | Status: AC

## 2018-10-03 MED ORDER — MUPIROCIN 2 % EX OINT: TOPICAL_OINTMENT | CUTANEOUS | 0 refills | Status: DC

## 2018-10-03 MED ORDER — LIDOCAINE HCL (PF) 1 % IJ SOLN
5.0000 mL | Freq: Once | INTRAMUSCULAR | Status: AC
  Administered 2018-10-03: 5 mL

## 2018-10-03 NOTE — ED Provider Notes (Signed)
[facility] ____________________________________________  Time seen: Approximately 12:18 PM  I have reviewed the triage vital signs and the nursing notes.   HISTORY  Chief Complaint Laceration (right pinky toe)   HPI Andrew Myers is a 73 y.o. male presenting for evaluation of right fifth toe laceration that occurred just prior to arrival.  Patient reports he was getting in his sister's SUV.  States he was wearing flip-flops and the top of his right foot glanced along a piece of metal causing laceration.  Reports he has had bleeding since but better now.  Applied pressure.  No other alleviating measures attempted.  Denies pain.  Denies foot tenderness or decreased range of motion.  No paresthesias.  Denies other injury.  Tetanus immunization not up-to-date.  Reports otherwise doing well.  Derinda Late, MD: PCP   Past Medical History:  Diagnosis Date  . BPH (benign prostatic hyperplasia)   . Hyperlipidemia   . Hypertension   . Skin cancer    BCC, SCC  . Sleep apnea     There are no active problems to display for this patient.   Past Surgical History:  Procedure Laterality Date  . APPENDECTOMY    . CATARACT EXTRACTION    . FRACTURE SURGERY    . HERNIA REPAIR     hiatal     No current facility-administered medications for this encounter.   Current Outpatient Medications:  .  amLODipine (NORVASC) 5 MG tablet, Take 5 mg by mouth daily., Disp: , Rfl:  .  aspirin EC 81 MG tablet, Take 81 mg by mouth daily., Disp: , Rfl:  .  atorvastatin (LIPITOR) 20 MG tablet, Take 20 mg by mouth daily., Disp: , Rfl:  .  Multiple Vitamin (MULTIVITAMIN) capsule, Take 1 capsule by mouth daily., Disp: , Rfl:  .  nebivolol (BYSTOLIC) 5 MG tablet, Take 5 mg by mouth daily., Disp: , Rfl:  .  sildenafil (REVATIO) 20 MG tablet, Take 20 mg by mouth 3 (three) times daily., Disp: , Rfl:  .  tamsulosin (FLOMAX) 0.4 MG CAPS capsule, Take 0.4 mg by mouth daily after supper.,  Disp: , Rfl:  .  cephALEXin (KEFLEX) 500 MG capsule, Take 1 capsule (500 mg total) by mouth 3 (three) times daily for 5 days., Disp: 15 capsule, Rfl: 0 .  hydrochlorothiazide (HYDRODIURIL) 25 MG tablet, Take 25 mg by mouth daily., Disp: , Rfl:  .  losartan (COZAAR) 100 MG tablet, Take 100 mg by mouth daily., Disp: , Rfl:  .  mupirocin ointment (BACTROBAN) 2 %, Apply two times a day for 7 days., Disp: 22 g, Rfl: 0  Allergies Codeine  Family History  Problem Relation Age of Onset  . Breast cancer Mother   . Hypertension Mother   . Hyperlipidemia Mother   . Heart failure Mother   . Heart attack Father   . Thyroid cancer Sister     Social History Social History   Tobacco Use  . Smoking status: Former Smoker    Quit date: 05/07/1967    Years since quitting: 51.4  . Smokeless tobacco: Never Used  Substance Use Topics  . Alcohol use: Yes    Comment: rarely  . Drug use: No    Review of Systems Constitutional: No fever Cardiovascular: Denies chest pain. Respiratory: Denies shortness of breath. Gastrointestinal: No abdominal pain.  Musculoskeletal: Negative for back pain. Skin: Positive laceration. ____________________________________________   PHYSICAL EXAM:  VITAL SIGNS: ED Triage Vitals  Enc Vitals Group  BP 10/03/18 1116 (!) 147/80     Pulse Rate 10/03/18 1116 73     Resp 10/03/18 1116 16     Temp 10/03/18 1116 98.2 F (36.8 C)     Temp Source 10/03/18 1116 Oral     SpO2 10/03/18 1116 98 %     Weight 10/03/18 1113 225 lb (102.1 kg)     Height 10/03/18 1113 5\' 10"  (1.778 m)     Head Circumference --      Peak Flow --      Pain Score 10/03/18 1113 0     Pain Loc --      Pain Edu? --      Excl. in [place]? --     Constitutional: Alert and oriented. Well appearing and in no acute distress. Eyes: Conjunctivae are normal.  ENT      Head: Normocephalic and atraumatic. Cardiovascular: Normal rate, regular rhythm. Grossly normal heart sounds.  Good peripheral  circulation. Respiratory: Normal respiratory effort without tachypnea nor retractions. Breath sounds are clear and equal bilaterally. No wheezes, rales, rhonchi. Musculoskeletal: Steady gait.  Neurologic:  Normal speech and language. Speech is normal. No gait instability.  Skin:  Skin is warm, dry.  Except: Right foot dorsal aspect of proximal fifth toe 2 cm flap laceration with minimal active bleeding, some superficial dirt debris present, nontender, full range of motion present, normal distal sensation, right foot otherwise nontender. Psychiatric: Mood and affect are normal. Speech and behavior are normal. Patient exhibits appropriate insight and judgment   ___________________________________________   LABS (all labs ordered are listed, but only abnormal results are displayed)  Labs Reviewed - No data to display ____________________________________________  PROCEDURES Procedures   Procedure(s) performed:  Procedure explained and verbal consent obtained. Consent: Verbal consent obtained. Written consent not obtained. Risks and benefits: risks, benefits and alternatives were discussed Patient identity confirmed: verbally with patient and hospital-assigned identification number  Consent given by: patient   Laceration Repair Location: right foot Length: 2 cm Foreign bodies: Superficial debris removed.  No appearance of retained foreign body. Tendon involvement: none Nerve involvement: none Preparation: Patient was prepped and draped in the usual sterile fashion. Anesthesia with 1% Lidocaine 3 mls Cleaned with Betadine Irrigation solution: saline Irrigation method: jet lavage Amount of cleaning: copious Repaired with 5-0 nylon Number of sutures: 2 Technique: simple interrupted  Approximation: loose Patient tolerate well. Wound well approximated post repair.  Antibiotic ointment and dressing applied.  Wound care instructions provided.  Observe for any signs of infection or  other problems.      INITIAL IMPRESSION / ASSESSMENT AND PLAN / ED COURSE  Pertinent labs & imaging results that were available during my care of the patient were reviewed by me and considered in my medical decision making (see chart for details).  Well-appearing patient.  Right foot laceration post mechanical injury.  Nontender.  Copiously cleaned and irrigated, repaired as above.  Suture removal in 7 days.  Will empirically place on oral Keflex and topical Bactroban.  Monitoring.  Tdap updated.Discussed indication, risks and benefits of medications with patient.   Discussed follow up and return parameters including no resolution or any worsening concerns. Patient verbalized understanding and agreed to plan.   ____________________________________________   FINAL CLINICAL IMPRESSION(S) / ED DIAGNOSES  Final diagnoses:  Laceration of fifth toe of right foot, initial encounter     ED Discharge Orders         Ordered    cephALEXin (KEFLEX) 500 MG capsule  3 times daily     10/03/18 1220    mupirocin ointment (BACTROBAN) 2 %     10/03/18 1220           Note: This dictation was prepared with Dragon dictation along with smaller phrase technology. Any transcriptional errors that result from this process are unintentional.         Marylene Land, NP 10/03/18 1601

## 2018-10-03 NOTE — Discharge Instructions (Addendum)
Take medication as prescribed. Keep clean as discussed. Monitor. Allow open to air time.  Return to urgent care in 7 days for suture removal.  Follow up with your primary care physician this week as needed. Return to Urgent care for new or worsening concerns.

## 2018-10-03 NOTE — ED Triage Notes (Signed)
Patient states that he was getting in his sisters pathfinder and had a rusty spot on the running board that cut his toe. Patient states that he was bleeding a lot when this happened around 10:15am. Patient states that TDAP was around 2009.

## 2018-10-10 DIAGNOSIS — R3121 Asymptomatic microscopic hematuria: Secondary | ICD-10-CM | POA: Diagnosis not present

## 2018-10-10 DIAGNOSIS — N401 Enlarged prostate with lower urinary tract symptoms: Secondary | ICD-10-CM | POA: Diagnosis not present

## 2018-10-10 DIAGNOSIS — R350 Frequency of micturition: Secondary | ICD-10-CM | POA: Diagnosis not present

## 2018-10-17 DIAGNOSIS — N200 Calculus of kidney: Secondary | ICD-10-CM | POA: Diagnosis not present

## 2018-10-17 DIAGNOSIS — R3121 Asymptomatic microscopic hematuria: Secondary | ICD-10-CM | POA: Diagnosis not present

## 2018-10-20 DIAGNOSIS — Z79899 Other long term (current) drug therapy: Secondary | ICD-10-CM | POA: Diagnosis not present

## 2018-10-20 DIAGNOSIS — Z125 Encounter for screening for malignant neoplasm of prostate: Secondary | ICD-10-CM | POA: Diagnosis not present

## 2018-10-20 DIAGNOSIS — R739 Hyperglycemia, unspecified: Secondary | ICD-10-CM | POA: Diagnosis not present

## 2018-10-20 DIAGNOSIS — E7800 Pure hypercholesterolemia, unspecified: Secondary | ICD-10-CM | POA: Diagnosis not present

## 2018-10-21 DIAGNOSIS — M5417 Radiculopathy, lumbosacral region: Secondary | ICD-10-CM | POA: Diagnosis not present

## 2018-10-21 DIAGNOSIS — M9905 Segmental and somatic dysfunction of pelvic region: Secondary | ICD-10-CM | POA: Diagnosis not present

## 2018-10-21 DIAGNOSIS — M9903 Segmental and somatic dysfunction of lumbar region: Secondary | ICD-10-CM | POA: Diagnosis not present

## 2018-10-21 DIAGNOSIS — M5136 Other intervertebral disc degeneration, lumbar region: Secondary | ICD-10-CM | POA: Diagnosis not present

## 2018-10-27 DIAGNOSIS — Z0000 Encounter for general adult medical examination without abnormal findings: Secondary | ICD-10-CM | POA: Diagnosis not present

## 2018-10-27 DIAGNOSIS — R972 Elevated prostate specific antigen [PSA]: Secondary | ICD-10-CM | POA: Insufficient documentation

## 2018-10-27 DIAGNOSIS — E119 Type 2 diabetes mellitus without complications: Secondary | ICD-10-CM | POA: Insufficient documentation

## 2018-11-25 DIAGNOSIS — M9903 Segmental and somatic dysfunction of lumbar region: Secondary | ICD-10-CM | POA: Diagnosis not present

## 2018-11-25 DIAGNOSIS — M5417 Radiculopathy, lumbosacral region: Secondary | ICD-10-CM | POA: Diagnosis not present

## 2018-11-25 DIAGNOSIS — M5136 Other intervertebral disc degeneration, lumbar region: Secondary | ICD-10-CM | POA: Diagnosis not present

## 2018-11-25 DIAGNOSIS — M9905 Segmental and somatic dysfunction of pelvic region: Secondary | ICD-10-CM | POA: Diagnosis not present

## 2018-12-30 DIAGNOSIS — H26492 Other secondary cataract, left eye: Secondary | ICD-10-CM | POA: Diagnosis not present

## 2018-12-30 DIAGNOSIS — H18413 Arcus senilis, bilateral: Secondary | ICD-10-CM | POA: Diagnosis not present

## 2018-12-30 DIAGNOSIS — H40013 Open angle with borderline findings, low risk, bilateral: Secondary | ICD-10-CM | POA: Diagnosis not present

## 2018-12-30 DIAGNOSIS — Z961 Presence of intraocular lens: Secondary | ICD-10-CM | POA: Diagnosis not present

## 2019-01-05 ENCOUNTER — Ambulatory Visit
Admission: EM | Admit: 2019-01-05 | Discharge: 2019-01-05 | Disposition: A | Discharge status: Home / Self Care | Payer: PPO

## 2019-01-05 ENCOUNTER — Encounter: Payer: Self-pay | Admitting: Emergency Medicine

## 2019-01-05 ENCOUNTER — Ambulatory Visit (INDEPENDENT_AMBULATORY_CARE_PROVIDER_SITE_OTHER): Payer: PPO

## 2019-01-05 ENCOUNTER — Other Ambulatory Visit: Payer: Self-pay

## 2019-01-05 DIAGNOSIS — R109 Unspecified abdominal pain: Secondary | ICD-10-CM

## 2019-01-05 DIAGNOSIS — K5900 Constipation, unspecified: Secondary | ICD-10-CM | POA: Diagnosis not present

## 2019-01-05 DIAGNOSIS — R141 Gas pain: Secondary | ICD-10-CM

## 2019-01-05 DIAGNOSIS — R140 Abdominal distension (gaseous): Secondary | ICD-10-CM | POA: Diagnosis not present

## 2019-01-05 MED ORDER — PHAZYME MAXIMUM STRENGTH 250 MG PO CAPS: 1.0000 | ORAL_CAPSULE | Freq: Two times a day (BID) | ORAL | 0 refills | Status: DC | PRN

## 2019-01-05 MED ORDER — POLYETHYLENE GLYCOL 3350 17 GM/SCOOP PO POWD: ORAL | 0 refills | Status: DC

## 2019-01-05 NOTE — ED Provider Notes (Signed)
[place], [place]   Name: Andrew Myers DOB: 1945/04/01 MRN: [id number] CSN: [id number] PCP: Derinda Late, MD  Arrival date and time:  01/05/19 1320  Chief Complaint:  Bloated   NOTE: Prior to seeing the patient today, I have reviewed the triage nursing documentation and vital signs. Clinical staff has updated patient's PMH/PSHx, current medication list, and drug allergies/intolerances to ensure comprehensive history available to assist in medical decision making.   History:   HPI: Andrew Myers is a 73 y.o. male who presents today with complaints of abdominal discomfort since yesterday. He has mild nausea with no actual emesis episodes. He notes that his temperature has been elevated as high as 99.4. Patient states, "I feel like if I could just pass gas I would feel so much better". Patient took a total of four simethicone 125 mg tablets yesterday without relief. He notes that his bowels moved "just a little bit" earlier today; no appreciated hematochezia or melena. He is eating and drinking well. Dietary intake reported to be as per his baseline habits. Patient ahs not experienced any urinary symptoms; no dysuria, frequency, urgency, hematuria, or difficulty initiating stream. Patient recently on ABX and opioid pain medications following a dental procedure. He reports that the use of the opioids was limited to just a few doses.   Past Medical History:  Diagnosis Date   BPH (benign prostatic hyperplasia)    Hyperlipidemia    Hypertension    Skin cancer    BCC, SCC   Sleep apnea     Past Surgical History:  Procedure Laterality Date   APPENDECTOMY     CATARACT EXTRACTION     FRACTURE SURGERY      Family History  Problem Relation Age of Onset   Breast cancer Mother    Hypertension Mother    Hyperlipidemia Mother    Heart failure Mother    Heart attack Father    Thyroid cancer Sister     Social History   Tobacco Use   Smoking status: Former  Smoker    Quit date: 05/07/1967    Years since quitting: 51.7   Smokeless tobacco: Never Used  Substance Use Topics   Alcohol use: Yes    Comment: rarely   Drug use: No    There are no active problems to display for this patient.   Home Medications:    Current Meds  Medication Sig   amLODipine (NORVASC) 5 MG tablet Take 5 mg by mouth daily.   aspirin EC 81 MG tablet Take 81 mg by mouth daily.   atorvastatin (LIPITOR) 20 MG tablet Take 20 mg by mouth daily.   hydrochlorothiazide (HYDRODIURIL) 25 MG tablet Take 25 mg by mouth daily.   nebivolol (BYSTOLIC) 5 MG tablet Take 5 mg by mouth daily.   tamsulosin (FLOMAX) 0.4 MG CAPS capsule Take 0.4 mg by mouth daily after supper.   valsartan (DIOVAN) 160 MG tablet Take by mouth.   [DISCONTINUED] losartan (COZAAR) 100 MG tablet Take 100 mg by mouth daily.   [DISCONTINUED] Multiple Vitamin (MULTIVITAMIN) capsule Take 1 capsule by mouth daily.    Allergies:   Codeine  Review of Systems (ROS): Review of Systems  Constitutional: Positive for fever (Tmax 99.4). Negative for chills.  Respiratory: Negative for cough and shortness of breath.   Cardiovascular: Negative for chest pain and palpitations.  Gastrointestinal: Positive for abdominal distention, abdominal pain and constipation. Negative for blood in stool, diarrhea, nausea, rectal pain and vomiting.  Genitourinary: Negative for difficulty  urinating, dysuria, flank pain, frequency and urgency.  Musculoskeletal: Negative for back pain.  Skin: Negative for color change, pallor and rash.  Neurological: Negative for dizziness, syncope, weakness and headaches.  All other systems reviewed and are negative.    Vital Signs: Today's Vitals   01/05/19 1341 01/05/19 1342 01/05/19 1442  BP: (!) 168/81    Pulse: 76    Resp: 18    Temp: 98.2 F (36.8 C)    TempSrc: Oral    SpO2: 99%    Weight:  230 lb (104.3 kg)   Height:  5\' 10"  (1.778 m)   PainSc: 2   2     Physical  Exam: Physical Exam  Constitutional: He is oriented to person, place, and time and well-developed, well-nourished, and in no distress.  HENT:  Head: Normocephalic and atraumatic.  Mouth/Throat: Mucous membranes are normal.  Eyes: Pupils are equal, round, and reactive to light. EOM are normal.  Neck: Normal range of motion. Neck supple.  Cardiovascular: Normal rate, regular rhythm, normal heart sounds and intact distal pulses. Exam reveals no gallop and no friction rub.  No murmur heard. Pulmonary/Chest: Effort normal and breath sounds normal. No respiratory distress. He has no wheezes. He has no rales.  Abdominal: Soft. He exhibits distension (slight). Bowel sounds are hypoactive. There is no hepatosplenomegaly. There is generalized abdominal tenderness. There is no rigidity, no rebound, no guarding, no CVA tenderness and no tenderness at McBurney's point. A hernia is present. Hernia confirmed positive in the ventral area.  Neurological: He is alert and oriented to person, place, and time. Gait normal.  Skin: Skin is warm and dry. No rash noted.  Psychiatric: Mood, memory, affect and judgment normal.  Nursing note and vitals reviewed.   Urgent Care Treatments / Results:   LABS: PLEASE NOTE: all labs that were ordered this encounter are listed, however only abnormal results are displayed. Labs Reviewed - No data to display  EKG: -None  RADIOLOGY: Dg Abd 2 Views  Result Date: 01/05/2019 CLINICAL DATA:  Diffuse abdominal pressure and distension. Low-grade fever. Nausea. EXAM: ABDOMEN - 2 VIEW COMPARISON:  CT 10/17/2018 FINDINGS: No free intra-abdominal air. No bowel dilatation to suggest obstruction. Small volume of stool in the ascending colon and hepatic flexure. Small volume of stool in the rectum. Mild of paucity small bowel gas. Tiny right renal stone on prior CT is not well visualized radiographically. There are prostatic calcifications. Degenerative change in the spine, no acute  osseous abnormalities are seen. IMPRESSION: 1. Normal bowel gas pattern. Small volume of stool in the ascending colon and hepatic flexure. No evidence of obstruction or ileus. 2. Tiny right renal stone on prior CT is not well visualized radiographically. Electronically Signed   By: Keith Rake M.D.   On: 01/05/2019 14:19    PROCEDURES: Procedures  MEDICATIONS RECEIVED THIS VISIT: Medications - No data to display  PERTINENT CLINICAL COURSE NOTES/UPDATES:   Initial Impression / Assessment and Plan / Urgent Care Course:  Pertinent labs & imaging results that were available during my care of the patient were personally reviewed by me and considered in my medical decision making (see lab/imaging section of note for values and interpretations).  Manbir Loschiavo is a 73 y.o. male who presents to [facility] today with complaints of Bloated   Patient is well appearing overall in clinic today. He does not appear to be in any acute distress. Presenting symptoms (see HPI) and exam as documented above. Diagnostic radiographs of  the abdomen revealed no dilated loops of bowel, free air, or evidence of ileus. There was a colonic stool burden in the ascending colon. Discussed that constipation likely multifactorial and related to age, fluid intake, activity level, and quite possibly his recent ABX and opioid pain medication use. Will send in prescription for Miralax to help stimulate bowel action, in addition to Phazyme maximum strength (simethacone 250 mg) to help with discomfort associated with abdominal gas. Encouraged patient to increase fluid intake, physical activity, and dietary fiber intake to help promote normal bowel activity. He was asked to return a call to the clinic if these interventions do to prove to be effective in improving his symptoms.   Discussed follow up with primary care physician in 1 week for re-evaluation. I have reviewed the follow up and strict return precautions for  any new or worsening symptoms. Patient is aware of symptoms that would be deemed urgent/emergent, and would thus require further evaluation either here or in the emergency department. At the time of discharge, he verbalized understanding and consent with the discharge plan as it was reviewed with him. All questions were fielded by provider and/or clinic staff prior to patient discharge.    Final Clinical Impressions / Urgent Care Diagnoses:   Final diagnoses:  Constipation, unspecified constipation type  Abdominal gas pain    New Prescriptions:  [place] Controlled Substance Registry consulted? Not Applicable  Meds ordered this encounter  Medications   polyethylene glycol powder (MIRALAX) 17 GM/SCOOP powder    Sig: 1 scoop (17 grams) daily as needed for constipation.    Dispense:  255 g    Refill:  0   Simethicone (PHAZYME MAXIMUM STRENGTH) 250 MG CAPS    Sig: Take 1 tablet by mouth 2 (two) times daily as needed.    Dispense:  10 capsule    Refill:  0    Recommended Follow up Care:  Patient encouraged to follow up with the following provider within the specified time frame, or sooner as dictated by the severity of his symptoms. As always, he was instructed that for any urgent/emergent care needs, he should seek care either here or in the emergency department for more immediate evaluation.  Follow-up Information    Derinda Late, MD In 1 week.   Specialty: Family Medicine Why: General reassessment of symptoms if not improving Contact information: [address] [facility] [place] [address] [phone]         NOTE: This note was prepared using Dragon dictation software along with smaller phrase technology. Despite my best ability to proofread, there is the potential that transcriptional errors may still occur from this process, and are completely unintentional.    Karen Kitchens, NP 01/07/19 [phone]

## 2019-01-05 NOTE — Discharge Instructions (Signed)
It was very nice seeing you today in clinic. Thank you for entrusting me with your care.   Increased fluid intake and activity. Increasing your dietary fiber can also help. Use the medications as prescribed.   Make arrangements to follow up with your regular doctor in 1 week for re-evaluation if not improving. If your symptoms/condition worsens, please seek follow up care either here or in the ER. Please remember, our [place] providers are "right here with you" when you need [place].   Again, it was my pleasure to take care of you today. Thank you for choosing our clinic. I hope that you start to feel better quickly.   Honor Loh, MSN, APRN, FNP-C, CEN Advanced Practice Provider [facility]

## 2019-01-05 NOTE — ED Triage Notes (Signed)
Patient c/o feeling bloated yesterday. States he took Gas-x x 2 yesterday. He reports feeling nauseated. He states he had a fever of 99.4 this morning.

## 2019-04-20 DIAGNOSIS — E119 Type 2 diabetes mellitus without complications: Secondary | ICD-10-CM | POA: Diagnosis not present

## 2019-04-20 DIAGNOSIS — Z79899 Other long term (current) drug therapy: Secondary | ICD-10-CM | POA: Diagnosis not present

## 2019-04-20 DIAGNOSIS — E7800 Pure hypercholesterolemia, unspecified: Secondary | ICD-10-CM | POA: Diagnosis not present

## 2019-04-24 DIAGNOSIS — J189 Pneumonia, unspecified organism: Secondary | ICD-10-CM

## 2019-04-24 HISTORY — DX: Pneumonia, unspecified organism: J18.9

## 2019-05-02 DIAGNOSIS — N401 Enlarged prostate with lower urinary tract symptoms: Secondary | ICD-10-CM | POA: Diagnosis not present

## 2019-05-02 DIAGNOSIS — R3915 Urgency of urination: Secondary | ICD-10-CM | POA: Diagnosis not present

## 2019-05-02 DIAGNOSIS — I10 Essential (primary) hypertension: Secondary | ICD-10-CM | POA: Diagnosis not present

## 2019-05-02 DIAGNOSIS — E7800 Pure hypercholesterolemia, unspecified: Secondary | ICD-10-CM | POA: Diagnosis not present

## 2019-05-02 DIAGNOSIS — Z125 Encounter for screening for malignant neoplasm of prostate: Secondary | ICD-10-CM | POA: Diagnosis not present

## 2019-05-02 DIAGNOSIS — Z79899 Other long term (current) drug therapy: Secondary | ICD-10-CM | POA: Diagnosis not present

## 2019-05-02 DIAGNOSIS — E119 Type 2 diabetes mellitus without complications: Secondary | ICD-10-CM | POA: Diagnosis not present

## 2019-05-11 ENCOUNTER — Ambulatory Visit (INDEPENDENT_AMBULATORY_CARE_PROVIDER_SITE_OTHER): Payer: PPO

## 2019-05-11 ENCOUNTER — Other Ambulatory Visit: Payer: Self-pay

## 2019-05-11 ENCOUNTER — Ambulatory Visit
Admission: EM | Admit: 2019-05-11 | Discharge: 2019-05-11 | Disposition: A | Discharge status: Home / Self Care | Payer: PPO | Attending: Emergency Medicine | Admitting: Emergency Medicine

## 2019-05-11 ENCOUNTER — Encounter: Payer: Self-pay | Admitting: Emergency Medicine

## 2019-05-11 DIAGNOSIS — R918 Other nonspecific abnormal finding of lung field: Secondary | ICD-10-CM | POA: Diagnosis not present

## 2019-05-11 DIAGNOSIS — R059 Cough, unspecified: Secondary | ICD-10-CM

## 2019-05-11 DIAGNOSIS — R05 Cough: Secondary | ICD-10-CM

## 2019-05-11 DIAGNOSIS — J189 Pneumonia, unspecified organism: Secondary | ICD-10-CM

## 2019-05-11 MED ORDER — AMOXICILLIN-POT CLAVULANATE ER 1000-62.5 MG PO TB12: 2.0000 | ORAL_TABLET | Freq: Two times a day (BID) | ORAL | 0 refills | Status: AC

## 2019-05-11 MED ORDER — BENZONATATE 200 MG PO CAPS: 200.0000 mg | ORAL_CAPSULE | Freq: Three times a day (TID) | ORAL | 0 refills | Status: DC | PRN

## 2019-05-11 MED ORDER — AZITHROMYCIN 250 MG PO TABS: 250.0000 mg | ORAL_TABLET | Freq: Every day | ORAL | 0 refills | Status: DC

## 2019-05-11 NOTE — ED Provider Notes (Signed)
HPI  SUBJECTIVE:  Andrew Myers is a 74 y.o. male who presents with intermittent cough that is occasionally dry, occasionally productive of clear or greenish mucus for the past 3 weeks.  He states that he felt feverish with chills for the first night, none since.  He reports postnasal drip, wheezing, chest soreness secondary to the cough.  He has had both Covid vaccines, dose #2 was on 3/2.  He denies body aches headache sore throat loss of sense of smell or taste chest pain shortness of breath.  No abdominal pain unintentional weight gain leg swelling nocturia orthopnea PND.  No GERD symptoms.  He reports nasal congestion sneezing, watery eyes this morning, but was having the cough prior to the symptoms starting.  He denies unintentional weight loss, night sweats.  No antibiotics in the past 3 months.  No antipyretic in the past 4 to 6 hours.  He has been taking Coricidin high blood pressure Zyrtec dextromethorphan and Flonase.  The Cordicrin helps at night, and the Nettie pot helps with the postnasal drip.  No aggravating factors.  Cough seems to be worse in the morning at the end of the day, gets better as the day goes on.  Is not associated with lying down flat.  Patient has a past medical history of hypertension on an ARB, diabetes not requiring any medicine, GERD, allergic rhinitis she tends to be worse in the spring, hypercholesterolemia obstructive sleep apnea on BiPAP.  He used to smoke a pipe.  No history of CHF asthma emphysema COPD pneumonia frequent sinusitis.  He had a cough with lisinopril.  [id number], Beverely Low, MD   Past Medical History:  Diagnosis Date  . BPH (benign prostatic hyperplasia)   . Hyperlipidemia   . Hypertension   . Skin cancer    BCC, SCC  . Sleep apnea     Past Surgical History:  Procedure Laterality Date  . APPENDECTOMY    . CATARACT EXTRACTION    . FRACTURE SURGERY      Family History  Problem Relation Age of Onset  . Breast cancer Mother   .  Hypertension Mother   . Hyperlipidemia Mother   . Heart failure Mother   . Heart attack Father   . Thyroid cancer Sister     Social History   Tobacco Use  . Smoking status: Former Smoker    Quit date: 05/07/1967    Years since quitting: 52.0  . Smokeless tobacco: Never Used  Substance Use Topics  . Alcohol use: Yes    Comment: rarely  . Drug use: No    No current facility-administered medications for this encounter.  Current Outpatient Medications:  .  amLODipine (NORVASC) 5 MG tablet, Take 5 mg by mouth daily., Disp: , Rfl:  .  aspirin EC 81 MG tablet, Take 81 mg by mouth daily., Disp: , Rfl:  .  atorvastatin (LIPITOR) 20 MG tablet, Take 20 mg by mouth daily., Disp: , Rfl:  .  cetirizine (ZYRTEC) 10 MG tablet, Take 10 mg by mouth daily., Disp: , Rfl:  .  hydrochlorothiazide (HYDRODIURIL) 25 MG tablet, Take 25 mg by mouth daily., Disp: , Rfl:  .  nebivolol (BYSTOLIC) 5 MG tablet, Take 5 mg by mouth daily., Disp: , Rfl:  .  sildenafil (REVATIO) 20 MG tablet, Take 20 mg by mouth 3 (three) times daily., Disp: , Rfl:  .  tamsulosin (FLOMAX) 0.4 MG CAPS capsule, Take 0.4 mg by mouth daily after supper., Disp: , Rfl:  .  valsartan (DIOVAN) 160 MG tablet, Take by mouth., Disp: , Rfl:  .  amoxicillin-clavulanate (AUGMENTIN XR) 1000-62.5 MG 12 hr tablet, Take 2 tablets by mouth 2 (two) times daily for 7 days., Disp: 28 tablet, Rfl: 0 .  azithromycin (ZITHROMAX) 250 MG tablet, Take 1 tablet (250 mg total) by mouth daily. 2 tabs po on day 1, 1 tab po on days 2-5, Disp: 6 tablet, Rfl: 0 .  benzonatate (TESSALON) 200 MG capsule, Take 1 capsule (200 mg total) by mouth 3 (three) times daily as needed for cough., Disp: 30 capsule, Rfl: 0  Allergies  Allergen Reactions  . Codeine Rash     ROS  As noted in HPI.   Physical Exam  BP (!) 142/70 (BP Location: Left Arm)   Pulse 75   Temp 98.5 F (36.9 C) (Oral)   Resp 18   Ht 5\' 10"  (1.778 m)   Wt 103.4 kg   SpO2 98%   BMI 32.71 kg/m    Constitutional: Well developed, well nourished, no acute distress Eyes:  EOMI, conjunctiva normal bilaterally HENT: Normocephalic, atraumatic,mucus membranes moist.  Positive nasal congestion.  Normal turbinates.  No maxillary frontal sinus tenderness.  No obvious postnasal drip.  Positive cobblestoning. Respiratory: Normal inspiratory effort lungs clear bilaterally good air movement.  No anterior lateral chest wall tenderness. Cardiovascular: Normal rate regular rhythm no murmurs rubs or gallops GI: nondistended skin: No rash, skin intact Musculoskeletal: no deformities trace edema bilateral lower extremities Neurologic: Alert & oriented x 3, no focal neuro deficits Psychiatric: Speech and behavior appropriate   ED Course   Medications - No data to display  Orders Placed This Encounter  Procedures  . DG Chest 2 View    Standing Status:   Standing    Number of Occurrences:   1    Order Specific Question:   Reason for Exam (SYMPTOM  OR DIAGNOSIS REQUIRED)    Answer:   productive cough x 3 weeks    No results found for this or any previous visit (from the past 24 hour(s)). DG Chest 2 View  Result Date: 05/11/2019 CLINICAL DATA:  Productive cough EXAM: CHEST - 2 VIEW COMPARISON:  07/14/2007 FINDINGS: The heart size and mediastinal contours are within normal limits. Streaky retrocardiac opacity in the left lung base. Right lung is clear. No pleural effusion or pneumothorax. The visualized skeletal structures are unremarkable. IMPRESSION: Streaky retrocardiac opacity in the left lung base which may represent atelectasis or infiltrate. Electronically Signed   By: Davina Poke D.O.   On: 05/11/2019 14:49    ED Clinical Impression  1. Left lower lobe pulmonary infiltrate   2. Cough   3. Community acquired pneumonia of left lower lobe of lung      ED Assessment/Plan  Outside records, labs reviewed.  Additional medical history obtained.  As noted in HPI. Normal kidney fcn  04/20/2019  Checking chest x-ray due to duration of symptoms evaluate for pneumonia mass pulmonary edema.  Doubt COVID, CHF.    Reviewed imaging independently.  Streaky retrocardiac opacity in the left lung base atelectasis versus infiltrate.  See radiology report for full details.  infiltrate versus atelectasis on chest x-ray.  Will treat as a community acquired pneumonia to err on side of caution.  Because of his age and history of diabetes, will send home with azithromycin for 5 days and Augmentin extended release for 7 days.  Also sending home with Tessalon, saline nasal irrigation with a Milta Deiters med sinus rinse and distilled  water as often as he wants, continue Flonase, Coricidin.  Follow-up with PMD in 7 days.  To the ER if he gets worse.  Discussed labs, imaging, MDM, treatment plan, and plan for follow-up with patient. Discussed sn/sx that should prompt return to the ED. patient agrees with plan.   Meds ordered this encounter  Medications  . amoxicillin-clavulanate (AUGMENTIN XR) 1000-62.5 MG 12 hr tablet    Sig: Take 2 tablets by mouth 2 (two) times daily for 7 days.    Dispense:  28 tablet    Refill:  0  . azithromycin (ZITHROMAX) 250 MG tablet    Sig: Take 1 tablet (250 mg total) by mouth daily. 2 tabs po on day 1, 1 tab po on days 2-5    Dispense:  6 tablet    Refill:  0  . benzonatate (TESSALON) 200 MG capsule    Sig: Take 1 capsule (200 mg total) by mouth 3 (three) times daily as needed for cough.    Dispense:  30 capsule    Refill:  0    *This clinic note was created using L[employer]. Therefore, there may be occasional mistakes despite careful proofreading.   ?    Melynda Ripple, MD 05/11/19 1513

## 2019-05-11 NOTE — Discharge Instructions (Addendum)
You have an infiltrate on your x-ray, which is concerning for pneumonia.  I am sending you home on Augmentin and azithromycin.  Tessalon for the cough.  Continue saline nasal irrigation with a Milta Deiters med sinus rinse and distilled water as often as you want, Flonase, Coricidin.  Stop the Zyrtec. I do not think you need to restart Nexium.

## 2019-05-11 NOTE — ED Triage Notes (Addendum)
Patient c/o cough that started 3 weeks ago. Denies any other symptoms. States he has received both COVID vaccines. Patient has been taking Flonase, Coricidin and Equate cough medication for his symptoms.

## 2019-05-25 DIAGNOSIS — J90 Pleural effusion, not elsewhere classified: Secondary | ICD-10-CM

## 2019-05-25 HISTORY — DX: Pleural effusion, not elsewhere classified: J90

## 2019-05-31 DIAGNOSIS — R509 Fever, unspecified: Secondary | ICD-10-CM | POA: Diagnosis not present

## 2019-05-31 DIAGNOSIS — R05 Cough: Secondary | ICD-10-CM | POA: Diagnosis not present

## 2019-06-21 DIAGNOSIS — R05 Cough: Secondary | ICD-10-CM | POA: Diagnosis not present

## 2019-06-21 DIAGNOSIS — R509 Fever, unspecified: Secondary | ICD-10-CM | POA: Diagnosis not present

## 2019-06-22 DIAGNOSIS — R0602 Shortness of breath: Secondary | ICD-10-CM | POA: Diagnosis not present

## 2019-06-22 DIAGNOSIS — J90 Pleural effusion, not elsewhere classified: Secondary | ICD-10-CM | POA: Diagnosis not present

## 2019-06-23 ENCOUNTER — Other Ambulatory Visit: Payer: Self-pay | Admitting: Family Medicine

## 2019-06-23 DIAGNOSIS — J90 Pleural effusion, not elsewhere classified: Secondary | ICD-10-CM

## 2019-06-23 DIAGNOSIS — R0602 Shortness of breath: Secondary | ICD-10-CM

## 2019-06-24 DIAGNOSIS — J869 Pyothorax without fistula: Secondary | ICD-10-CM

## 2019-06-24 HISTORY — DX: Pyothorax without fistula: J86.9

## 2019-06-29 ENCOUNTER — Ambulatory Visit
Admission: RE | Admit: 2019-06-29 | Discharge: 2019-06-29 | Disposition: A | Discharge status: Home / Self Care | Payer: PPO | Source: Ambulatory Visit | Attending: Family Medicine | Admitting: Family Medicine

## 2019-06-29 ENCOUNTER — Other Ambulatory Visit: Payer: Self-pay

## 2019-06-29 DIAGNOSIS — R0602 Shortness of breath: Secondary | ICD-10-CM | POA: Insufficient documentation

## 2019-06-29 DIAGNOSIS — J90 Pleural effusion, not elsewhere classified: Secondary | ICD-10-CM | POA: Insufficient documentation

## 2019-07-04 ENCOUNTER — Other Ambulatory Visit
Admission: RE | Admit: 2019-07-04 | Discharge: 2019-07-04 | Disposition: A | Discharge status: Home / Self Care | Payer: PPO | Source: Ambulatory Visit | Attending: Pulmonary Disease | Admitting: Pulmonary Disease

## 2019-07-04 ENCOUNTER — Ambulatory Visit
Admission: RE | Admit: 2019-07-04 | Discharge: 2019-07-04 | Disposition: A | Discharge status: Home / Self Care | Payer: PPO | Source: Ambulatory Visit | Attending: Pulmonary Disease | Admitting: Pulmonary Disease

## 2019-07-04 ENCOUNTER — Other Ambulatory Visit: Payer: Self-pay | Admitting: Pulmonary Disease

## 2019-07-04 ENCOUNTER — Ambulatory Visit
Admission: RE | Admit: 2019-07-04 | Discharge: 2019-07-04 | Disposition: A | Discharge status: Home / Self Care | Payer: PPO | Source: Ambulatory Visit | Attending: Interventional Radiology | Admitting: Interventional Radiology

## 2019-07-04 ENCOUNTER — Other Ambulatory Visit: Payer: Self-pay

## 2019-07-04 DIAGNOSIS — J90 Pleural effusion, not elsewhere classified: Secondary | ICD-10-CM

## 2019-07-04 DIAGNOSIS — E669 Obesity, unspecified: Secondary | ICD-10-CM | POA: Diagnosis present

## 2019-07-04 DIAGNOSIS — I10 Essential (primary) hypertension: Secondary | ICD-10-CM | POA: Diagnosis present

## 2019-07-04 DIAGNOSIS — J189 Pneumonia, unspecified organism: Secondary | ICD-10-CM | POA: Diagnosis present

## 2019-07-04 DIAGNOSIS — E785 Hyperlipidemia, unspecified: Secondary | ICD-10-CM | POA: Diagnosis present

## 2019-07-04 DIAGNOSIS — Z87891 Personal history of nicotine dependence: Secondary | ICD-10-CM | POA: Diagnosis not present

## 2019-07-04 DIAGNOSIS — J9811 Atelectasis: Secondary | ICD-10-CM | POA: Diagnosis not present

## 2019-07-04 DIAGNOSIS — J439 Emphysema, unspecified: Secondary | ICD-10-CM | POA: Diagnosis not present

## 2019-07-04 DIAGNOSIS — I272 Pulmonary hypertension, unspecified: Secondary | ICD-10-CM | POA: Diagnosis present

## 2019-07-04 DIAGNOSIS — E876 Hypokalemia: Secondary | ICD-10-CM | POA: Diagnosis present

## 2019-07-04 DIAGNOSIS — Z85828 Personal history of other malignant neoplasm of skin: Secondary | ICD-10-CM | POA: Diagnosis not present

## 2019-07-04 DIAGNOSIS — Z4682 Encounter for fitting and adjustment of non-vascular catheter: Secondary | ICD-10-CM | POA: Diagnosis not present

## 2019-07-04 DIAGNOSIS — Z79899 Other long term (current) drug therapy: Secondary | ICD-10-CM | POA: Diagnosis not present

## 2019-07-04 DIAGNOSIS — Z20822 Contact with and (suspected) exposure to covid-19: Secondary | ICD-10-CM | POA: Diagnosis present

## 2019-07-04 DIAGNOSIS — J869 Pyothorax without fistula: Secondary | ICD-10-CM | POA: Diagnosis present

## 2019-07-04 DIAGNOSIS — R739 Hyperglycemia, unspecified: Secondary | ICD-10-CM | POA: Diagnosis present

## 2019-07-04 DIAGNOSIS — G4730 Sleep apnea, unspecified: Secondary | ICD-10-CM | POA: Diagnosis present

## 2019-07-04 DIAGNOSIS — R7881 Bacteremia: Secondary | ICD-10-CM | POA: Diagnosis present

## 2019-07-04 DIAGNOSIS — Z9889 Other specified postprocedural states: Secondary | ICD-10-CM

## 2019-07-04 DIAGNOSIS — Z7689 Persons encountering health services in other specified circumstances: Secondary | ICD-10-CM | POA: Diagnosis not present

## 2019-07-04 DIAGNOSIS — N400 Enlarged prostate without lower urinary tract symptoms: Secondary | ICD-10-CM | POA: Diagnosis present

## 2019-07-04 DIAGNOSIS — Z6832 Body mass index (BMI) 32.0-32.9, adult: Secondary | ICD-10-CM | POA: Diagnosis not present

## 2019-07-04 DIAGNOSIS — Z7982 Long term (current) use of aspirin: Secondary | ICD-10-CM | POA: Diagnosis not present

## 2019-07-04 DIAGNOSIS — I2510 Atherosclerotic heart disease of native coronary artery without angina pectoris: Secondary | ICD-10-CM | POA: Diagnosis present

## 2019-07-04 LAB — LACTATE DEHYDROGENASE, PLEURAL OR PERITONEAL FLUID: LD, Fluid: 2578 U/L — ABNORMAL HIGH (ref 3–23)

## 2019-07-04 LAB — AMYLASE, PLEURAL OR PERITONEAL FLUID: Amylase, Fluid: 13 U/L

## 2019-07-04 LAB — GLUCOSE, PLEURAL OR PERITONEAL FLUID: Glucose, Fluid: 28 mg/dL

## 2019-07-04 LAB — BODY FLUID CELL COUNT WITH DIFFERENTIAL
Eos, Fluid: 0 %
Lymphs, Fluid: 8 %
Monocyte-Macrophage-Serous Fluid: 1 %
Neutrophil Count, Fluid: 90 %
Other Cells, Fluid: 1 %
Total Nucleated Cell Count, Fluid: 7678 cu mm

## 2019-07-04 LAB — PROTEIN, PLEURAL OR PERITONEAL FLUID: Total protein, fluid: 4.2 g/dL

## 2019-07-04 LAB — BODY FLUID CULTURE

## 2019-07-04 LAB — SARS CORONAVIRUS 2 BY RT PCR (HOSPITAL ORDER, PERFORMED IN ~~LOC~~ HOSPITAL LAB): SARS Coronavirus 2: NEGATIVE

## 2019-07-04 NOTE — Procedures (Signed)
Interventional Radiology Procedure Note  Procedure: US guided right thoracentesis  Complications: None  Estimated Blood Loss: None  Findings: Complex, loculated pleural fluid by [place]. First pocket posteriorly yielded only 10 mL of amber colored fluid. Second pocket yielded 5 mL of grossly purulent fluid. Samples sent for lab testing.  Venetia Night. Kathlene Cote, M.D Pager:  [phone]

## 2019-07-05 ENCOUNTER — Emergency Department: Payer: PPO

## 2019-07-05 ENCOUNTER — Inpatient Hospital Stay
Admission: EM | Admit: 2019-07-05 | Discharge: 2019-07-14 | DRG: 177 | Disposition: A | Discharge status: Home / Self Care | Payer: PPO | Attending: Internal Medicine | Admitting: Internal Medicine

## 2019-07-05 ENCOUNTER — Encounter: Payer: Self-pay | Admitting: Emergency Medicine

## 2019-07-05 DIAGNOSIS — J9811 Atelectasis: Secondary | ICD-10-CM

## 2019-07-05 DIAGNOSIS — I10 Essential (primary) hypertension: Secondary | ICD-10-CM | POA: Diagnosis present

## 2019-07-05 DIAGNOSIS — E669 Obesity, unspecified: Secondary | ICD-10-CM | POA: Diagnosis present

## 2019-07-05 DIAGNOSIS — R9389 Abnormal findings on diagnostic imaging of other specified body structures: Secondary | ICD-10-CM

## 2019-07-05 DIAGNOSIS — J189 Pneumonia, unspecified organism: Secondary | ICD-10-CM | POA: Diagnosis present

## 2019-07-05 DIAGNOSIS — Z79899 Other long term (current) drug therapy: Secondary | ICD-10-CM | POA: Diagnosis not present

## 2019-07-05 DIAGNOSIS — Z20822 Contact with and (suspected) exposure to covid-19: Secondary | ICD-10-CM | POA: Diagnosis present

## 2019-07-05 DIAGNOSIS — Z85828 Personal history of other malignant neoplasm of skin: Secondary | ICD-10-CM

## 2019-07-05 DIAGNOSIS — Z7982 Long term (current) use of aspirin: Secondary | ICD-10-CM

## 2019-07-05 DIAGNOSIS — I272 Pulmonary hypertension, unspecified: Secondary | ICD-10-CM | POA: Diagnosis present

## 2019-07-05 DIAGNOSIS — J90 Pleural effusion, not elsewhere classified: Secondary | ICD-10-CM | POA: Diagnosis present

## 2019-07-05 DIAGNOSIS — Z87891 Personal history of nicotine dependence: Secondary | ICD-10-CM | POA: Diagnosis not present

## 2019-07-05 DIAGNOSIS — R739 Hyperglycemia, unspecified: Secondary | ICD-10-CM | POA: Diagnosis present

## 2019-07-05 DIAGNOSIS — E785 Hyperlipidemia, unspecified: Secondary | ICD-10-CM | POA: Diagnosis present

## 2019-07-05 DIAGNOSIS — E876 Hypokalemia: Secondary | ICD-10-CM | POA: Diagnosis present

## 2019-07-05 DIAGNOSIS — Z09 Encounter for follow-up examination after completed treatment for conditions other than malignant neoplasm: Secondary | ICD-10-CM

## 2019-07-05 DIAGNOSIS — J869 Pyothorax without fistula: Secondary | ICD-10-CM | POA: Diagnosis present

## 2019-07-05 DIAGNOSIS — I2510 Atherosclerotic heart disease of native coronary artery without angina pectoris: Secondary | ICD-10-CM | POA: Diagnosis present

## 2019-07-05 DIAGNOSIS — Z6832 Body mass index (BMI) 32.0-32.9, adult: Secondary | ICD-10-CM

## 2019-07-05 DIAGNOSIS — R7881 Bacteremia: Secondary | ICD-10-CM | POA: Diagnosis present

## 2019-07-05 DIAGNOSIS — N400 Enlarged prostate without lower urinary tract symptoms: Secondary | ICD-10-CM | POA: Diagnosis present

## 2019-07-05 DIAGNOSIS — G4730 Sleep apnea, unspecified: Secondary | ICD-10-CM | POA: Diagnosis present

## 2019-07-05 HISTORY — DX: Obesity, unspecified: E66.9

## 2019-07-05 LAB — CBC WITH DIFFERENTIAL/PLATELET
Abs Immature Granulocytes: 0.08 10*3/uL — ABNORMAL HIGH (ref 0.00–0.07)
Basophils Absolute: 0.1 10*3/uL (ref 0.0–0.1)
Basophils Relative: 1 %
Eosinophils Absolute: 0.2 10*3/uL (ref 0.0–0.5)
Eosinophils Relative: 1 %
HCT: 39.5 % (ref 39.0–52.0)
Hemoglobin: 13.4 g/dL (ref 13.0–17.0)
Immature Granulocytes: 1 %
Lymphocytes Relative: 16 %
Lymphs Abs: 1.8 10*3/uL (ref 0.7–4.0)
MCH: 28.6 pg (ref 26.0–34.0)
MCHC: 33.9 g/dL (ref 30.0–36.0)
MCV: 84.4 fL (ref 80.0–100.0)
Monocytes Absolute: 0.9 10*3/uL (ref 0.1–1.0)
Monocytes Relative: 8 %
Neutro Abs: 8.5 10*3/uL — ABNORMAL HIGH (ref 1.7–7.7)
Neutrophils Relative %: 73 %
Platelets: 308 10*3/uL (ref 150–400)
RBC: 4.68 MIL/uL (ref 4.22–5.81)
RDW: 13.2 % (ref 11.5–15.5)
WBC: 11.5 10*3/uL — ABNORMAL HIGH (ref 4.0–10.5)
nRBC: 0 % (ref 0.0–0.2)

## 2019-07-05 LAB — COMPREHENSIVE METABOLIC PANEL
ALT: 30 U/L (ref 0–44)
AST: 24 U/L (ref 15–41)
Albumin: 3.5 g/dL (ref 3.5–5.0)
Alkaline Phosphatase: 57 U/L (ref 38–126)
Anion gap: 12 (ref 5–15)
BUN: 10 mg/dL (ref 8–23)
CO2: 26 mmol/L (ref 22–32)
Calcium: 9.1 mg/dL (ref 8.9–10.3)
Chloride: 97 mmol/L — ABNORMAL LOW (ref 98–111)
Creatinine, Ser: 0.6 mg/dL — ABNORMAL LOW (ref 0.61–1.24)
GFR calc Af Amer: >60 mL/min (ref >60)
GFR calc non Af Amer: >60 mL/min (ref >60)
Glucose, Bld: 150 mg/dL — ABNORMAL HIGH (ref 70–99)
Potassium: 3.4 mmol/L — ABNORMAL LOW (ref 3.5–5.1)
Sodium: 135 mmol/L (ref 135–145)
Total Bilirubin: 0.8 mg/dL (ref 0.3–1.2)
Total Protein: 8.1 g/dL (ref 6.5–8.1)

## 2019-07-05 LAB — LACTIC ACID, PLASMA: Lactic Acid, Venous: 1.9 mmol/L (ref 0.5–1.9)

## 2019-07-05 LAB — URINALYSIS, COMPLETE (UACMP) WITH MICROSCOPIC
Bacteria, UA: NONE SEEN
Bilirubin Urine: NEGATIVE
Glucose, UA: NEGATIVE mg/dL
Hgb urine dipstick: NEGATIVE
Ketones, ur: NEGATIVE mg/dL
Leukocytes,Ua: NEGATIVE
Nitrite: NEGATIVE
Protein, ur: NEGATIVE mg/dL
Specific Gravity, Urine: 1.019 (ref 1.005–1.030)
Squamous Epithelial / HPF: NONE SEEN (ref 0–5)
pH: 7 (ref 5.0–8.0)

## 2019-07-05 LAB — PH, BODY FLUID: pH, Body Fluid: 7.2

## 2019-07-05 LAB — PROCALCITONIN: Procalcitonin: <0.1 ng/mL

## 2019-07-05 MED ORDER — MIDAZOLAM HCL 5 MG/5ML IJ SOLN
INTRAMUSCULAR | Status: AC
  Filled 2019-07-05: qty 5

## 2019-07-05 MED ORDER — IRBESARTAN 75 MG PO TABS
37.5000 mg | ORAL_TABLET | Freq: Every day | ORAL | Status: DC
  Administered 2019-07-06 – 2019-07-14 (×9): 37.5 mg via ORAL
  Filled 2019-07-05 (×10): qty 0.5

## 2019-07-05 MED ORDER — VANCOMYCIN HCL IN DEXTROSE 1-5 GM/200ML-% IV SOLN
1000.0000 mg | Freq: Three times a day (TID) | INTRAVENOUS | Status: DC
  Administered 2019-07-06: 1000 mg via INTRAVENOUS
  Filled 2019-07-05 (×3): qty 200

## 2019-07-05 MED ORDER — SODIUM CHLORIDE 0.9 % IV SOLN
2.0000 g | Freq: Once | INTRAVENOUS | Status: AC
  Administered 2019-07-05: 2 g via INTRAVENOUS
  Filled 2019-07-05: qty 20

## 2019-07-05 MED ORDER — FENTANYL CITRATE (PF) 100 MCG/2ML IJ SOLN
INTRAMUSCULAR | Status: AC
  Filled 2019-07-05: qty 2

## 2019-07-05 MED ORDER — SODIUM CHLORIDE 0.9 % IV SOLN
INTRAVENOUS | Status: DC
  Administered 2019-07-08 – 2019-07-10 (×2): 100 mL/h via INTRAVENOUS

## 2019-07-05 MED ORDER — HYDROCHLOROTHIAZIDE 25 MG PO TABS
25.0000 mg | ORAL_TABLET | Freq: Every day | ORAL | Status: DC
  Administered 2019-07-06 – 2019-07-14 (×9): 25 mg via ORAL
  Filled 2019-07-05 (×9): qty 1

## 2019-07-05 MED ORDER — FENTANYL CITRATE (PF) 100 MCG/2ML IJ SOLN
INTRAMUSCULAR | Status: AC | PRN
  Administered 2019-07-05: 50 ug via INTRAVENOUS

## 2019-07-05 MED ORDER — ONDANSETRON HCL 4 MG PO TABS: 4.0000 mg | ORAL_TABLET | Freq: Four times a day (QID) | ORAL | Status: DC | PRN

## 2019-07-05 MED ORDER — ASPIRIN EC 81 MG PO TBEC: 162.0000 mg | DELAYED_RELEASE_TABLET | Freq: Every day | ORAL | Status: DC

## 2019-07-05 MED ORDER — AMLODIPINE BESYLATE 5 MG PO TABS
5.0000 mg | ORAL_TABLET | Freq: Every day | ORAL | Status: DC
  Administered 2019-07-06 – 2019-07-14 (×9): 5 mg via ORAL
  Filled 2019-07-05 (×9): qty 1

## 2019-07-05 MED ORDER — MIDAZOLAM HCL 5 MG/5ML IJ SOLN
INTRAMUSCULAR | Status: AC | PRN
  Administered 2019-07-05: 1 mg via INTRAVENOUS

## 2019-07-05 MED ORDER — VANCOMYCIN HCL 2000 MG/400ML IV SOLN
2000.0000 mg | Freq: Once | INTRAVENOUS | Status: AC
  Administered 2019-07-05: 2000 mg via INTRAVENOUS
  Filled 2019-07-05: qty 400

## 2019-07-05 MED ORDER — ONDANSETRON HCL 4 MG/2ML IJ SOLN: 4.0000 mg | Freq: Four times a day (QID) | INTRAMUSCULAR | Status: DC | PRN

## 2019-07-05 MED ORDER — ACETAMINOPHEN 325 MG PO TABS
650.0000 mg | ORAL_TABLET | Freq: Four times a day (QID) | ORAL | Status: DC | PRN
  Administered 2019-07-10 – 2019-07-13 (×5): 650 mg via ORAL
  Filled 2019-07-05 (×5): qty 2

## 2019-07-05 MED ORDER — SODIUM CHLORIDE 0.9% FLUSH
3.0000 mL | Freq: Once | INTRAVENOUS | Status: AC
  Administered 2019-07-07: 3 mL via INTRAVENOUS

## 2019-07-05 MED ORDER — NEBIVOLOL HCL 5 MG PO TABS
5.0000 mg | ORAL_TABLET | Freq: Every day | ORAL | Status: DC
  Administered 2019-07-06 – 2019-07-14 (×9): 5 mg via ORAL
  Filled 2019-07-05 (×11): qty 1

## 2019-07-05 MED ORDER — LORATADINE 10 MG PO TABS
10.0000 mg | ORAL_TABLET | Freq: Every day | ORAL | Status: DC
  Administered 2019-07-06 – 2019-07-14 (×9): 10 mg via ORAL
  Filled 2019-07-05 (×9): qty 1

## 2019-07-05 MED ORDER — ATORVASTATIN CALCIUM 20 MG PO TABS
20.0000 mg | ORAL_TABLET | Freq: Every day | ORAL | Status: DC
  Administered 2019-07-06 – 2019-07-13 (×8): 20 mg via ORAL
  Filled 2019-07-05 (×8): qty 1

## 2019-07-05 MED ORDER — TAMSULOSIN HCL 0.4 MG PO CAPS
0.8000 mg | ORAL_CAPSULE | Freq: Every day | ORAL | Status: DC
  Administered 2019-07-06 – 2019-07-13 (×8): 0.8 mg via ORAL
  Filled 2019-07-05 (×8): qty 2

## 2019-07-05 MED ORDER — ASPIRIN EC 81 MG PO TBEC
162.0000 mg | DELAYED_RELEASE_TABLET | Freq: Every day | ORAL | Status: DC
  Administered 2019-07-06 – 2019-07-14 (×9): 162 mg via ORAL
  Filled 2019-07-05 (×11): qty 2

## 2019-07-05 MED ORDER — ACETAMINOPHEN 650 MG RE SUPP: 650.0000 mg | Freq: Four times a day (QID) | RECTAL | Status: DC | PRN

## 2019-07-05 MED ORDER — PIPERACILLIN-TAZOBACTAM 3.375 G IVPB
3.3750 g | Freq: Three times a day (TID) | INTRAVENOUS | Status: DC
  Administered 2019-07-05 – 2019-07-08 (×8): 3.375 g via INTRAVENOUS
  Filled 2019-07-05 (×8): qty 50

## 2019-07-05 MED ORDER — SILDENAFIL CITRATE 20 MG PO TABS
20.0000 mg | ORAL_TABLET | Freq: Three times a day (TID) | ORAL | Status: DC
  Administered 2019-07-05 – 2019-07-06 (×4): 20 mg via ORAL
  Filled 2019-07-05 (×8): qty 1

## 2019-07-05 MED ORDER — SODIUM CHLORIDE 0.9 % IV SOLN: Freq: Once | INTRAVENOUS | Status: AC

## 2019-07-05 NOTE — ED Notes (Signed)
Pt off floor for chest tube at this time.

## 2019-07-05 NOTE — Progress Notes (Signed)
PHARMACY -  BRIEF ANTIBIOTIC NOTE   Pharmacy has received consult(s) for Vancomycin from an ED provider.  The patient's profile has been reviewed for ht/wt/allergies/indication/available labs.    One time order(s) placed for Vancomycin 2g IV x 1 dose.  Further antibiotics/pharmacy consults should be ordered by admitting physician if indicated.                       Thank you, Pearla Dubonnet 07/05/2019  12:39 PM

## 2019-07-05 NOTE — ED Notes (Signed)
Pt resting comfortably watching tv, no co pain or shob at this time. IVF infusing well, chest tube noted to right chest, attached to oasis dry suction. Small amt of blood tinged drainage noted to tubing, dressing to area clean and dry. Pt awaiting hospital bed, monitor in place, call bed within reach.

## 2019-07-05 NOTE — ED Notes (Signed)
Pt watching, tv no complaints at this time.

## 2019-07-05 NOTE — H&P (Addendum)
History and Physical    Andrew Myers [id number] DOB: [birth year] DOA: 07/05/2019  PCP: Derinda Late, MD Consultants:   Patient coming from:  Home   Chief Complaint: sent to ED by pulmonologist for empyema  HPI: Andrew Myers is a 74 y.o. male with medical history significant of recent pna, htn, hyperglycemia, obesity, sleep apnea sent to ED by pulmonology for treatment of empyema.  Patient reported in March of this year he was treated for LLL infiltrate.  He completed 3 rounds antibiotics and steroids. By 4/29 he remained symptomatic.  Associated symptoms include intermittent fevers with max temp 99.9, intermittent right chest stabbing pain worse with deep breath some mild shortness of breath and general malaise.  He denies headache dizziness syncope or near syncope.  He denies chest pain palpitations lower extremity edema.  He denies abdominal pain nausea vomiting diarrhea constipation melena bright red blood per rectum.  He denies any dysuria hematuria frequency or urgency.  CT chest revealed pleural effusion. Underwent thoracentesis 07/04/19 yielding 59ml fluid. Fluid studies revealed gram +bacteria concerning for empyema. Patient was called at home and instructed to come to ED for admission.   ED Course: Chest x-ray from yesterday negative for pneumothorax concerning for loculated right pleural fluid.  In the emergency department he is afebrile hemodynamically stable and nontoxic appearing.  Mild leukocytosis.  Provided with IV fluids and IV antibiotics.  Evaluated by Dr. Genevive Bi who indicated chest tube will be needed  Ambulatory Status: Ambulates without assistance  Past Medical History:  Diagnosis Date  . BPH (benign prostatic hyperplasia)   . CAP (community acquired pneumonia) 04/2019  . Empyema lung ([place]) 06/2019  . Hyperlipidemia   . Hypertension   . Obesity   . Pleural effusion on right 05/2019  . Skin cancer    BCC, SCC  . Sleep apnea   . Sleep apnea      Past Surgical History:  Procedure Laterality Date  . APPENDECTOMY    . CATARACT EXTRACTION    . FRACTURE SURGERY      Social History   Socioeconomic History  . Marital status: Married    Spouse name: Not on file  . Number of children: Not on file  . Years of education: Not on file  . Highest education level: Not on file  Occupational History  . Not on file  Tobacco Use  . Smoking status: Former Smoker    Quit date: 05/07/1967    Years since quitting: 52.1  . Smokeless tobacco: Never Used  Substance and Sexual Activity  . Alcohol use: Yes    Comment: rarely  . Drug use: No  . Sexual activity: Not on file  Other Topics Concern  . Not on file  Social History Narrative  . Not on file   Social Determinants of Health   Financial Resource Strain:   . Difficulty of Paying Living Expenses:   Food Insecurity:   . Worried About Charity fundraiser in the Last Year:   . Arboriculturist in the Last Year:   Transportation Needs:   . Film/video editor (Medical):   Marland Kitchen Lack of Transportation (Non-Medical):   Physical Activity:   . Days of Exercise per Week:   . Minutes of Exercise per Session:   Stress:   . Feeling of Stress :   Social Connections:   . Frequency of Communication with Friends and Family:   . Frequency of Social Gatherings with Friends and Family:   .  Attends Religious Services:   . Active Member of Clubs or Organizations:   . Attends Archivist Meetings:   Marland Kitchen Marital Status:   Intimate Partner Violence:   . Fear of Current or Ex-Partner:   . Emotionally Abused:   Marland Kitchen Physically Abused:   . Sexually Abused:     Allergies  Allergen Reactions  . Codeine Rash    Family History  Problem Relation Age of Onset  . Breast cancer Mother   . Hypertension Mother   . Hyperlipidemia Mother   . Heart failure Mother   . Heart attack Father   . Thyroid cancer Sister     Prior to Admission medications   Medication Sig Start Date End Date  Taking? Authorizing Provider  amLODipine (NORVASC) 5 MG tablet Take 5 mg by mouth daily.    [provider]  aspirin EC 81 MG tablet Take 81 mg by mouth daily.    [provider]  atorvastatin (LIPITOR) 20 MG tablet Take 20 mg by mouth daily.    [provider]  azithromycin (ZITHROMAX) 250 MG tablet Take 1 tablet (250 mg total) by mouth daily. 2 tabs po on day 1, 1 tab po on days 2-5 05/11/19   Melynda Ripple, MD  benzonatate (TESSALON) 200 MG capsule Take 1 capsule (200 mg total) by mouth 3 (three) times daily as needed for cough. 05/11/19   Melynda Ripple, MD  cetirizine (ZYRTEC) 10 MG tablet Take 10 mg by mouth daily.    [provider]  hydrochlorothiazide (HYDRODIURIL) 25 MG tablet Take 25 mg by mouth daily. 08/18/18   [provider]  nebivolol (BYSTOLIC) 5 MG tablet Take 5 mg by mouth daily.    [provider]  sildenafil (REVATIO) 20 MG tablet Take 20 mg by mouth 3 (three) times daily.    [provider]  tamsulosin (FLOMAX) 0.4 MG CAPS capsule Take 0.4 mg by mouth daily after supper.    [provider]  valsartan (DIOVAN) 160 MG tablet Take by mouth. 10/27/18 10/27/19  [provider]  losartan (COZAAR) 100 MG tablet Take 100 mg by mouth daily. 09/05/18 01/05/19  [provider]  losartan-hydrochlorothiazide (HYZAAR) 100-12.5 MG tablet Take 1 tablet by mouth daily.  10/03/18  [provider]  Simethicone (PHAZYME MAXIMUM STRENGTH) 250 MG CAPS Take 1 tablet by mouth 2 (two) times daily as needed. 01/05/19 05/11/19  Karen Kitchens, NP    Physical Exam: Vitals:   07/05/19 1028 07/05/19 1130 07/05/19 1200 07/05/19 1230  BP: (!) 143/73 (!) 141/82 (!) 151/81 134/81  Pulse: 81 77 78 74  Resp: 16     Temp: 98.3 F (36.8 C)     TempSrc: Oral     SpO2: 97% 97% 95% 96%  Weight:      Height:         . General:  Obese. Appears calm and comfortable and is NAD . Eyes:  PERRL, EOMI, normal lids,  iris . ENT:  grossly normal hearing, lips & tongue, mmm; appropriate dentition . Neck:  no LAD, masses or thyromegaly; no carotid bruits . Cardiovascular:  RRR, no m/r/g. No LE edema.  Marland Kitchen Respiratory: No increased work of breathing.  Fine crackles auscultated in the right side. . Abdomen: Obese.  Soft, NT, ND, NABS . Back:   normal alignment, no CVAT . Skin:  no rash or induration seen on limited exam . Musculoskeletal:  grossly normal tone BUE/BLE, good ROM, no bony abnormality . Lower  extremity:  No LE edema.  Limited foot exam with no ulcerations.  2+ distal pulses. Marland Kitchen Psychiatric:  grossly normal mood and affect, speech fluent and appropriate, AOx3 . Neurologic:  CN 2-12 grossly intact, moves all extremities in coordinated fashion, sensation intact    Radiological Exams on Admission: DG Chest 1 View  Result Date: 07/04/2019 CLINICAL DATA:  Status post right thoracentesis. EXAM: CHEST  1 VIEW COMPARISON:  CT of the chest on 06/29/2019 FINDINGS: The heart size and mediastinal contours are within normal limits. Residual lateral/posterolateral component of loculated pleural fluid remains on the right. No pneumothorax after right thoracentesis. No pulmonary edema. The visualized skeletal structures are unremarkable. IMPRESSION: No pneumothorax after right sided thoracentesis. Residual lateral/posterolateral component of loculated right pleural fluid remains. Electronically Signed   By: Aletta Edouard M.D.   On: 07/04/2019 14:26   US THORACENTESIS ASP PLEURAL SPACE W/IMG GUIDE  Result Date: 07/04/2019 CLINICAL DATA:  Loculated right pleural effusion. EXAM: ULTRASOUND GUIDED RIGHT THORACENTESIS COMPARISON:  CT of the chest on 06/29/2019 PROCEDURE: An ultrasound guided thoracentesis was thoroughly discussed with the patient and questions answered. The benefits, risks, alternatives and complications were also discussed. The patient understands and wishes to proceed with the procedure. Written consent  was obtained. Ultrasound was performed to localize and mark an adequate pocket of fluid in the right chest. The area was then prepped and draped in the normal sterile fashion. 1% Lidocaine was used for local anesthesia. Under ultrasound guidance a 6 French Safe-T-Centesis catheter was introduced. Fluid was removed from the catheter. The catheter was then removed. Under ultrasound guidance, a 5 [place] Yueh centesis catheter was then introduced in a slightly higher pocket of pleural fluid in the right posterior chest. Aspiration was performed through the centesis catheter. The catheter was removed and a dressing applied. FINDINGS: Ultrasound shows a very complex appearing and loculated right pleural effusion with relatively small components posteriorly. From the first pocket sampled in the right posterior pleural space towards the lung base, only 10 mL of clear appearing amber fluid was able to be aspirated. The sample was sent for various requested labs. Decision was made to sample a second slightly higher pocket yielding 5 mL of grossly purulent whitish green thick fluid. This sample was sent for culture analysis. IMPRESSION: Highly loculated and complex right pleural effusion with relatively small pockets posteriorly. The first pocket yielded 10 mL of clear appearing amber fluid. The second more superior pocket yielded 5 mL of grossly purulent fluid likely consistent with empyema. Fluid analysis will be performed on both samples. Electronically Signed   By: Aletta Edouard M.D.   On: 07/04/2019 14:24    EKG: Independently reviewed.  Labs on Admission: I have personally reviewed the available labs and imaging studies at the time of the admission.  Pertinent labs: Lab work reveals potassium 3.4, chloride 97, serum glucose 150, WBC 11.5     Assessment/Plan Principal Problem:   Empyema (HCC) Active Problems:   Hypokalemia   Hypertension   Hyperglycemia   Hyperlipidemia   BPH (benign prostatic  hyperplasia)   Obesity   Sleep apnea   #1.  Empyema.  Patient underwent a thoracentesis May 11 and fluid studies positive for gram-positive bacteria.  He was instructed to come to the emergency department for admission/treatment. Evaluated by Dr. Genevive Bi and reportedly a chest tube is in his future. -Continue IV antibiotics. Will place patient on Vancomycin and Zosyn per pharmacy dosing -Follow blood cultures -Gentle IV fluids -Supportive therapy -Other  management per pulm/CT surgery/ID  #2.  Hypokalemia.  Mild.  Likely related to diuretic  Potassium level 3.4. -Monitor - replete as indicated  #3.  Hypertension.  Stable in the emergency department.  Home medications include amlodipine, hydrochlorothiazide, Bystolic, valsartan. -Resume home meds -Monitor  #4.  Sleep apnea.  Patient reports he uses a home BiPAP. -Respiratory consult for device  #5.  BPH.  Stable at baseline. -Continue home meds  #6.  Obesity.  BMI 32. -nutritional consult  #7.  Hyperlipidemia.  Medications include a statin -New home meds  #8.  Hyperglycemia. No hx of same. Likely related to recent  steroid use.  Serum glucose 150. -Monitor -I expect to trend downward   #9. Pulmonary Hypertension Continue Revatio  Note: This patient has been tested and is negative for the novel coronavirus COVID-19.  DVT prophylaxis:  SCDs Code Status: Full - confirmed with patient/family Family Communication:  Patient at bedside Disposition Plan:  Home once clinically improved Consults called: CT surgery/ID/Pulmonology Admission status: [place] NP [facility]   How to contact the [facility] Attending or Consulting provider [place] or covering provider during after hours [place], for this patient?  1. Check the care team in [facility] and look for a) attending/consulting [facility] provider listed and b) the [facility] team listed 2. Log into [email or web address] and use 's universal password to access. If you do not have  the password, please contact the hospital operator. 3. Locate the [facility] provider you are looking for under [facility] and page to a number that you can be directly reached. 4. If you still have difficulty reaching the provider, please page the [facility] (Director on Call) for the Hospitalists listed on amion for assistance.   07/05/2019, 2:03 PM

## 2019-07-05 NOTE — Procedures (Signed)
Interventional Radiology Procedure Note  Procedure: CT right chest tube insertion  Complications: None  Estimated Blood Loss: min  Findings: Thick mucinous pleural fld asp cx sent

## 2019-07-05 NOTE — ED Notes (Deleted)
Neuro consult completed, son at bedside.

## 2019-07-05 NOTE — Consult Note (Signed)
Pulmonary Medicine          Date: 07/05/2019,   MRN# [id number] Andrew Myers May 10, 1945     AdmissionWeight: 103.4 kg                 CurrentWeight: 98.9 kg   Refering physician:Dr Agbata   CHIEF COMPLAINT:   Pneumonia with Empyema   HISTORY OF PRESENT ILLNESS   This is a very pleasant 74yo M who was seen by me in office yesterday due to LRTI and pleural effusion on left.  He had been sick with febrile LRTI symptoms. He had 1st dose of COVID19 vaccination in early Feb 2021, then got flu like symptoms 2 weeks later.  He had left sided infiltrate on CXR at that time, since then he had numerous antibiotic rounds and prednisone. He had sharp pain and this seems to be imrpoved but not resolved. He had levoquin 7d, zithromax 5d, augmentin 7d. He continued to have fevers with measurred Tmax >102.  He did smoke for 5 years in 41s. His family hx includes thyroid cancer in sister, father, niece, and breast cancer in mother.  Patient has personal hx of cancer of skin. He has 1+ trace LE edema, he had EKG with LBBB.  Patient has had both doses of covid immunization. Patient had thoracentesis done yesterday with frank pus from aspirate consistent with empyema. I have discussed this with patient yesterday and he agreed to come to hospital for additional evaluation.    PAST MEDICAL HISTORY   Past Medical History:  Diagnosis Date  . BPH (benign prostatic hyperplasia)   . CAP (community acquired pneumonia) 04/2019  . Empyema lung ([place]) 06/2019  . Hyperlipidemia   . Hypertension   . Obesity   . Pleural effusion on right 05/2019  . Skin cancer    BCC, SCC  . Sleep apnea   . Sleep apnea      SURGICAL HISTORY   Past Surgical History:  Procedure Laterality Date  . APPENDECTOMY    . CATARACT EXTRACTION    . FRACTURE SURGERY       FAMILY HISTORY   Family History  Problem Relation Age of Onset  . Breast cancer Mother   . Hypertension Mother   . Hyperlipidemia  Mother   . Heart failure Mother   . Heart attack Father   . Thyroid cancer Sister      SOCIAL HISTORY   Social History   Tobacco Use  . Smoking status: Former Smoker    Quit date: 05/07/1967    Years since quitting: 52.1  . Smokeless tobacco: Never Used  Substance Use Topics  . Alcohol use: Yes    Comment: rarely  . Drug use: No     MEDICATIONS    Home Medication:  Current Outpatient Rx  . Order #: [id number] Class: Historical Med  . Order #: [id number] Class: Historical Med  . Order #: [id number] Class: Historical Med  . Order #: [id number] Class: Historical Med  . Order #: [id number] Class: Historical Med  . Order #: [id number] Class: Historical Med  . Order #: [id number] Class: Historical Med  . Order #: [id number] Class: Historical Med  . Order #: [id number] Class: Historical Med  . Order #: [id number] Class: Historical Med    Current Medication:  Current Facility-Administered Medications:  .  0.9 %  sodium chloride infusion, , Intravenous, Continuous, Agbata, Tochukwu, MD .  acetaminophen (TYLENOL) tablet 650 mg, 650 mg, Oral, Q6H PRN **OR** acetaminophen (TYLENOL) suppository 650 mg, 650 mg, Rectal, Q6H PRN,  Agbata, Tochukwu, MD .  amLODipine (NORVASC) tablet 5 mg, 5 mg, Oral, Daily, Agbata, Tochukwu, MD .  aspirin EC tablet 162 mg, 162 mg, Oral, Daily, Agbata, Tochukwu, MD .  atorvastatin (LIPITOR) tablet 20 mg, 20 mg, Oral, Daily, Agbata, Tochukwu, MD .  fentaNYL (SUBLIMAZE) 100 MCG/2ML injection, , , ,  .  hydrochlorothiazide (HYDRODIURIL) tablet 25 mg, 25 mg, Oral, Daily, Agbata, Tochukwu, MD .  irbesartan (AVAPRO) tablet 37.5 mg, 37.5 mg, Oral, Daily, Agbata, Tochukwu, MD .  loratadine (CLARITIN) tablet 10 mg, 10 mg, Oral, Daily, Agbata, Tochukwu, MD .  midazolam (VERSED) 5 MG/5ML injection, , , ,  .  nebivolol (BYSTOLIC) tablet 5 mg, 5 mg, Oral, Daily, Agbata, Tochukwu, MD .  ondansetron (ZOFRAN) tablet 4 mg, 4 mg, Oral, Q6H PRN **OR** ondansetron (ZOFRAN) injection 4 mg,  4 mg, Intravenous, Q6H PRN, Agbata, Tochukwu, MD .  sildenafil (REVATIO) tablet 20 mg, 20 mg, Oral, TID, Agbata, Tochukwu, MD .  sodium chloride flush (NS) 0.9 % injection 3 mL, 3 mL, Intravenous, Once, Duffy Bruce, MD .  tamsulosin (FLOMAX) capsule 0.8 mg, 0.8 mg, Oral, QPC supper, Agbata, Tochukwu, MD .  vancomycin (VANCOREADY) IVPB 2000 mg/400 mL, 2,000 mg, Intravenous, Once, Nazari, Walid A, RPH  Current Outpatient Medications:  .  amLODipine (NORVASC) 5 MG tablet, Take 5 mg by mouth daily., Disp: , Rfl:  .  aspirin EC 81 MG tablet, Take 81 mg by mouth 2 (two) times daily. , Disp: , Rfl:  .  atorvastatin (LIPITOR) 20 MG tablet, Take 20 mg by mouth at bedtime. , Disp: , Rfl:  .  cetirizine (ZYRTEC) 10 MG tablet, Take 10 mg by mouth daily., Disp: , Rfl:  .  hydrochlorothiazide (HYDRODIURIL) 25 MG tablet, Take 25 mg by mouth daily., Disp: , Rfl:  .  Multiple Vitamin (MULTI-VITAMIN) tablet, Take 1 tablet by mouth daily., Disp: , Rfl:  .  nebivolol (BYSTOLIC) 5 MG tablet, Take 5 mg by mouth daily., Disp: , Rfl:  .  sildenafil (REVATIO) 20 MG tablet, Take 20-100 mg by mouth daily as needed (ED). , Disp: , Rfl:  .  tamsulosin (FLOMAX) 0.4 MG CAPS capsule, Take 0.8 mg by mouth daily after supper. , Disp: , Rfl:  .  valsartan (DIOVAN) 160 MG tablet, Take 160 mg by mouth daily. , Disp: , Rfl:     ALLERGIES   Codeine     REVIEW OF SYSTEMS    Review of Systems:  Gen:  Denies  fever, sweats, chills weigh loss  HEENT: Denies blurred vision, double vision, ear pain, eye pain, hearing loss, nose bleeds, sore throat Cardiac:  No dizziness, chest pain or heaviness, chest tightness,edema Resp:   Denies cough or sputum porduction, shortness of breath,wheezing, hemoptysis,  Gi: Denies swallowing difficulty, stomach pain, nausea or vomiting, diarrhea, constipation, bowel incontinence Gu:  Denies bladder incontinence, burning urine Ext:   Denies Joint pain, stiffness or swelling Skin: Denies   skin rash, easy bruising or bleeding or hives Endoc:  Denies polyuria, polydipsia , polyphagia or weight change Psych:   Denies depression, insomnia or hallucinations   Other:  All other systems negative   VS: BP 113/77   Pulse 79   Temp 98.3 F (36.8 C) (Oral)   Resp 16   Ht 5\' 10"  (1.778 m)   Wt 98.9 kg   SpO2 99%   BMI 31.28 kg/m      PHYSICAL EXAM    GENERAL:NAD, no fevers, chills, no weakness no fatigue HEAD:  Normocephalic, atraumatic.  EYES: Pupils equal, round, reactive to light. Extraocular muscles intact. No scleral icterus.  MOUTH: Moist mucosal membrane. Dentition intact. No abscess noted.  EAR, NOSE, THROAT: Clear without exudates. No external lesions.  NECK: Supple. No thyromegaly. No nodules. No JVD.  PULMONARY: Decreased air entry on right CARDIOVASCULAR: S1 and S2. Regular rate and rhythm. No murmurs, rubs, or gallops. No edema. Pedal pulses 2+ bilaterally.  GASTROINTESTINAL: Soft, nontender, nondistended. No masses. Positive bowel sounds. No hepatosplenomegaly.  MUSCULOSKELETAL: No swelling, clubbing, or edema. Range of motion full in all extremities.  NEUROLOGIC: Cranial nerves II through XII are intact. No gross focal neurological deficits. Sensation intact. Reflexes intact.  SKIN: No ulceration, lesions, rashes, or cyanosis. Skin warm and dry. Turgor intact.  PSYCHIATRIC: Mood, affect within normal limits. The patient is awake, alert and oriented x 3. Insight, judgment intact.       IMAGING    DG Chest 1 View  Result Date: 07/04/2019 CLINICAL DATA:  Status post right thoracentesis. EXAM: CHEST  1 VIEW COMPARISON:  CT of the chest on 06/29/2019 FINDINGS: The heart size and mediastinal contours are within normal limits. Residual lateral/posterolateral component of loculated pleural fluid remains on the right. No pneumothorax after right thoracentesis. No pulmonary edema. The visualized skeletal structures are unremarkable. IMPRESSION: No pneumothorax  after right sided thoracentesis. Residual lateral/posterolateral component of loculated right pleural fluid remains. Electronically Signed   By: Aletta Edouard M.D.   On: 07/04/2019 14:26   CT CHEST WO CONTRAST  Result Date: 06/29/2019 CLINICAL DATA:  Pleural effusion, shortness of breath. Follow-up chest x-ray EXAM: CT CHEST WITHOUT CONTRAST TECHNIQUE: Multidetector CT imaging of the chest was performed following the standard protocol without IV contrast. COMPARISON:  Chest x-ray 05/11/2019 FINDINGS: Cardiovascular: Heart is normal size. Aorta normal caliber. Scattered coronary artery and aortic calcifications. Mediastinum/Nodes: No mediastinal, hilar, or axillary adenopathy. Trachea and esophagus are unremarkable. Thyroid unremarkable. Lungs/Pleura: Partially loculated moderate-sized right pleural effusion noted. Compressive atelectasis or pneumonia in the right lower lobe. Scattered granulomas in the right lung. Left lung clear. Upper Abdomen: Imaging into the upper abdomen shows no acute findings. Musculoskeletal: Chest wall soft tissues are unremarkable. No acute bony abnormality. IMPRESSION: Moderate partially loculated right pleural effusion. Right lower lobe compressive atelectasis versus pneumonia. Electronically Signed   By: Rolm Baptise M.D.   On: 06/29/2019 14:56   US THORACENTESIS ASP PLEURAL SPACE W/IMG GUIDE  Result Date: 07/04/2019 CLINICAL DATA:  Loculated right pleural effusion. EXAM: ULTRASOUND GUIDED RIGHT THORACENTESIS COMPARISON:  CT of the chest on 06/29/2019 PROCEDURE: An ultrasound guided thoracentesis was thoroughly discussed with the patient and questions answered. The benefits, risks, alternatives and complications were also discussed. The patient understands and wishes to proceed with the procedure. Written consent was obtained. Ultrasound was performed to localize and mark an adequate pocket of fluid in the right chest. The area was then prepped and draped in the normal sterile  fashion. 1% Lidocaine was used for local anesthesia. Under ultrasound guidance a 6 French Safe-T-Centesis catheter was introduced. Fluid was removed from the catheter. The catheter was then removed. Under ultrasound guidance, a 5 [place] Yueh centesis catheter was then introduced in a slightly higher pocket of pleural fluid in the right posterior chest. Aspiration was performed through the centesis catheter. The catheter was removed and a dressing applied. FINDINGS: Ultrasound shows a very complex appearing and loculated right pleural effusion with relatively small components posteriorly. From the first pocket sampled in the right posterior pleural space  towards the lung base, only 10 mL of clear appearing amber fluid was able to be aspirated. The sample was sent for various requested labs. Decision was made to sample a second slightly higher pocket yielding 5 mL of grossly purulent whitish green thick fluid. This sample was sent for culture analysis. IMPRESSION: Highly loculated and complex right pleural effusion with relatively small pockets posteriorly. The first pocket yielded 10 mL of clear appearing amber fluid. The second more superior pocket yielded 5 mL of grossly purulent fluid likely consistent with empyema. Fluid analysis will be performed on both samples. Electronically Signed   By: Aletta Edouard M.D.   On: 07/04/2019 14:24      ASSESSMENT/PLAN   Community acquired Pneumonia  -present on admission - due to gram positive cocci such as streptococcus or staph aureus - patient has had numerous courses of antibiotics on outpatient basis- higher risk for resistant bacteria -s/p thoracentesis with - GPC + -agree with empiric vanco/zosyn - appreciate pharmD -procalcitonin trending -MRSA nasal PCR  -bronchopulmonary hygiene with Acapella and IS x10/h   Empyema of right pleural space  - +GPCs - s/p 29F pigtail cathter -fluid studies resent today and recultured -discussed case with Dr Genevive Bi -  likely will need tPA - appreciate input    Thank you for allowing me to participate in the care of this patient.  .   Patient/Family are satisfied with care plan and all questions have been answered.  This document was prepared using Dragon voice recognition software and may include unintentional dictation errors.     Ottie Glazier, M.D.  Division of [facility]

## 2019-07-05 NOTE — ED Provider Notes (Signed)
[facility] Emergency Department Provider Note  ____________________________________________   First MD Initiated Contact with Patient 07/05/19 1122     (approximate)  I have reviewed the triage vital signs and the nursing notes.   HISTORY  Chief Complaint Empyema    HPI Andrew Myers is a 74 y.o. male with past medical history as below including recent history of recurrent pneumonia since March complicated by initially what was thought to be a pleural effusion but now is likely an empyema.  He was just seen by pulmonary yesterday, and had a scheduled thoracentesis performed which showed evidence of acute infection and gram-positive bacteria.  He was told to come here for admission.  He reports that he has been having intermittent fevers off and on for the last several weeks, but that he currently feels overall well.  Denies any shortness of breath.  No history of previous empyema or pulmonary complications.  He has some mild right side pain intermittently, but this is not acutely worsened.  He does feel mildly improved since the thoracentesis yesterday.  No pain at his thoracentesis site.  No other complaints.        Past Medical History:  Diagnosis Date  . BPH (benign prostatic hyperplasia)   . CAP (community acquired pneumonia) 04/2019  . Empyema lung ([place]) 06/2019  . Hyperlipidemia   . Hypertension   . Obesity   . Pleural effusion on right 05/2019  . Skin cancer    BCC, SCC  . Sleep apnea   . Sleep apnea     Patient Active Problem List   Diagnosis Date Noted  . Hypokalemia 07/05/2019  . Hyperglycemia 07/05/2019  . Empyema of right pleural space ([place]) 07/05/2019  . Empyema ([place])   . Hypertension   . Hyperlipidemia   . BPH (benign prostatic hyperplasia)   . Obesity   . Sleep apnea     Past Surgical History:  Procedure Laterality Date  . APPENDECTOMY    . CATARACT EXTRACTION    . FRACTURE SURGERY      Prior to Admission  medications   Medication Sig Start Date End Date Taking? Authorizing Provider  amLODipine (NORVASC) 5 MG tablet Take 5 mg by mouth daily.   Yes [provider]  aspirin EC 81 MG tablet Take 81 mg by mouth 2 (two) times daily.    Yes [provider]  atorvastatin (LIPITOR) 20 MG tablet Take 20 mg by mouth at bedtime.    Yes [provider]  cetirizine (ZYRTEC) 10 MG tablet Take 10 mg by mouth daily.   Yes [provider]  hydrochlorothiazide (HYDRODIURIL) 25 MG tablet Take 25 mg by mouth daily. 08/18/18  Yes [provider]  Multiple Vitamin (MULTI-VITAMIN) tablet Take 1 tablet by mouth daily.   Yes [provider]  nebivolol (BYSTOLIC) 5 MG tablet Take 5 mg by mouth daily.   Yes [provider]  sildenafil (REVATIO) 20 MG tablet Take 20-100 mg by mouth daily as needed (ED).    Yes [provider]  tamsulosin (FLOMAX) 0.4 MG CAPS capsule Take 0.8 mg by mouth daily after supper.    Yes [provider]  valsartan (DIOVAN) 160 MG tablet Take 160 mg by mouth daily.  10/27/18 10/27/19 Yes [provider]  losartan (COZAAR) 100 MG tablet Take 100 mg by mouth daily. 09/05/18 01/05/19  [provider]  losartan-hydrochlorothiazide (HYZAAR) 100-12.5 MG tablet Take 1 tablet by mouth daily.  10/03/18  [provider]  Simethicone (PHAZYME MAXIMUM STRENGTH) 250 MG CAPS Take 1 tablet by mouth 2 (two) times daily as needed. 01/05/19 05/11/19  Karen Kitchens, NP    Allergies Codeine  Family History  Problem Relation Age of Onset  . Breast cancer Mother   . Hypertension Mother   . Hyperlipidemia Mother   . Heart failure Mother   . Heart attack Father   . Thyroid cancer Sister     Social History Social History   Tobacco Use  . Smoking status: Former Smoker    Quit date: 05/07/1967    Years since quitting: 52.1  . Smokeless tobacco: Never Used  Substance Use Topics  . Alcohol use: Yes    Comment:  rarely  . Drug use: No    Review of Systems  Review of Systems  Constitutional: Positive for chills, fatigue and fever.  HENT: Negative for sore throat.   Respiratory: Positive for cough. Negative for shortness of breath.   Cardiovascular: Negative for chest pain.  Gastrointestinal: Negative for abdominal pain.  Genitourinary: Negative for flank pain.  Musculoskeletal: Negative for neck pain.  Skin: Negative for rash and wound.  Allergic/Immunologic: Negative for immunocompromised state.  Neurological: Negative for weakness and numbness.  Hematological: Does not bruise/bleed easily.  All other systems reviewed and are negative.    ____________________________________________  PHYSICAL EXAM:      VITAL SIGNS: ED Triage Vitals  Enc Vitals Group     BP 07/05/19 1028 (!) 143/73     Pulse Rate 07/05/19 1028 81     Resp 07/05/19 1028 16     Temp 07/05/19 1028 98.3 F (36.8 C)     Temp Source 07/05/19 1028 Oral     SpO2 07/05/19 1028 97 %     Weight 07/05/19 1025 227 lb 15.3 oz (103.4 kg)     Height 07/05/19 1025 5\' 10"  (1.778 m)     Head Circumference --      Peak Flow --      Pain Score 07/05/19 1025 0     Pain Loc --      Pain Edu? --      Excl. in [place]? --      Physical Exam Vitals and nursing note reviewed.  Constitutional:      General: He is not in acute distress.    Appearance: He is well-developed.  HENT:     Head: Normocephalic and atraumatic.  Eyes:     Conjunctiva/sclera: Conjunctivae normal.  Cardiovascular:     Rate and Rhythm: Normal rate and regular rhythm.     Heart sounds: Normal heart sounds. No murmur. No friction rub.  Pulmonary:     Effort: Pulmonary effort is normal. No respiratory distress.     Breath sounds: Decreased air movement present. Examination of the right-lower field reveals rales. Rales present. No wheezing.  Abdominal:     General: There is no distension.     Palpations: Abdomen is soft.     Tenderness: There is no abdominal  tenderness.  Musculoskeletal:     Cervical back: Neck supple.  Skin:    General: Skin is warm.     Capillary Refill: Capillary refill takes less than 2 seconds.  Neurological:     Mental Status: He is alert and oriented to person, place, and time.     Motor: No abnormal muscle tone.       ____________________________________________   LABS (all labs ordered are listed, but only abnormal results are displayed)  Labs Reviewed  COMPREHENSIVE METABOLIC PANEL - Abnormal; Notable for the following components:      Result Value   Potassium 3.4 (*)    Chloride 97 (*)    Glucose, Bld 150 (*)    Creatinine, Ser 0.60 (*)    All other components within normal limits  CBC WITH DIFFERENTIAL/PLATELET - Abnormal; Notable for the following components:   WBC 11.5 (*)    Neutro Abs 8.5 (*)    Abs Immature Granulocytes 0.08 (*)    All other components within normal limits  CULTURE, BLOOD (ROUTINE X 2)  CULTURE, BLOOD (ROUTINE X 2)  AEROBIC/ANAEROBIC CULTURE (SURGICAL/DEEP WOUND)  FUNGUS CULTURE WITH STAIN  ACID FAST SMEAR (AFB, MYCOBACTERIA)  ACID FAST CULTURE WITH REFLEXED SENSITIVITIES (MYCOBACTERIA)  MRSA PCR SCREENING  LACTIC ACID, PLASMA  URINALYSIS, COMPLETE (UACMP) WITH MICROSCOPIC  PROCALCITONIN    ____________________________________________  EKG:  ________________________________________  RADIOLOGY All imaging, including plain films, CT scans, and ultrasounds, independently reviewed by me, and interpretations confirmed via formal radiology reads.  ED MD interpretation:   Chest x-ray reviewed from recent thoracentesis: No pneumothorax, loculated right pleural fluid  Official radiology report(s): CT IMAGE GUIDED DRAINAGE BY PERCUTANEOUS CATHETER  Result Date: 07/05/2019 INDICATION: Right empyema EXAM: CT-GUIDED PLACEMENT RIGHT 10 FRENCH CHEST TUBE MEDICATIONS: The patient is currently admitted to the hospital and receiving intravenous antibiotics. The antibiotics were  administered within an appropriate time frame prior to the initiation of the procedure. ANESTHESIA/SEDATION: Fentanyl 50 mcg IV; Versed 1.0 mg IV Moderate Sedation Time:  11 MINUTES The patient was continuously monitored during the procedure by the interventional radiology nurse under my direct supervision. COMPLICATIONS: None immediate. PROCEDURE: Informed written consent was obtained from the patient after a thorough discussion of the procedural risks, benefits and alternatives. All questions were addressed. Maximal Sterile Barrier Technique was utilized including caps, mask, sterile gowns, sterile gloves, sterile drape, hand hygiene and skin antiseptic. A timeout was performed prior to the initiation of the procedure. Previous imaging reviewed. Patient positioned right anterior oblique. Noncontrast localization CT performed. The loculated right effusion was localized and marked for a lateral posterior approach. Under sterile conditions and local anesthesia, 18 gauge introducer needle was advanced from a posterolateral lower intercostal approach into the pleural fluid. Needle position confirmed with CT. Guidewire inserted followed by tract dilatation insert a 10 French drain. Drain catheter position confirmed with CT. Syringe aspiration yielded 10 cc of thick mucinous exudative fluid. Sample sent for pan culture. Catheter secured with Prolene suture and connected to external pleura vac. Sterile dressing applied. No immediate complication. Patient tolerated the procedure well. IMPRESSION: Successful CT-guided 10 French right chest tube insertion. Electronically Signed   By: Jerilynn Mages.  Shick M.D.   On: 07/05/2019 15:44    ____________________________________________  PROCEDURES   Procedure(s) performed (including Critical Care):  Procedures  ____________________________________________  INITIAL IMPRESSION / MDM / [place] / ED COURSE  As part of my medical decision making, I reviewed the  following data within the [place] notes reviewed and incorporated, Old chart reviewed, Notes from prior ED visits, and [place] Controlled Substance Database       *Drago Mckendrick was evaluated in Emergency Department on 07/05/2019 for the symptoms described in the history of present illness. He was evaluated in the context of the global COVID-19 pandemic, which necessitated consideration that the patient might be at risk for infection with the SARS-CoV-2 virus that causes COVID-19. Institutional protocols and algorithms that pertain to the  evaluation of patients at risk for COVID-19 are in a state of rapid change based on information released by regulatory bodies including the CDC and federal and state organizations. These policies and algorithms were followed during the patient's care in the ED.  Some ED evaluations and interventions may be delayed as a result of limited staffing during the pandemic.*  Clinical Course as of Jul 04 1698  Wed Jul 05, 2019  1206 74 yo M here with empyema of R lung. Diagnosed via thoracentesis yesterday. He is afebrile, non-toxic currently. Cultures sent from pleural fluid. Will plan to admit, discuss with pt's pulmonologist re: chest tube placement and further management.   [CI]    Clinical Course User Index [CI] Duffy Bruce, MD    Medical Decision Making:  As above. Discussed with Dr. Mortimer Fries who recommended consult with Dr. Faith Rogue, who will evaluate patient. Admit to medicine.  ____________________________________________  FINAL CLINICAL IMPRESSION(S) / ED DIAGNOSES  Final diagnoses:  Empyema ([place])     MEDICATIONS GIVEN DURING THIS VISIT:  Medications  sodium chloride flush (NS) 0.9 % injection 3 mL (3 mLs Intravenous Not Given 07/05/19 1312)  vancomycin (VANCOREADY) IVPB 2000 mg/400 mL (2,000 mg Intravenous New Bag/Given 07/05/19 1628)  amLODipine (NORVASC) tablet 5 mg (has no administration in time range)  atorvastatin  (LIPITOR) tablet 20 mg (has no administration in time range)  hydrochlorothiazide (HYDRODIURIL) tablet 25 mg (has no administration in time range)  nebivolol (BYSTOLIC) tablet 5 mg (has no administration in time range)  sildenafil (REVATIO) tablet 20 mg (has no administration in time range)  irbesartan (AVAPRO) tablet 37.5 mg (has no administration in time range)  tamsulosin (FLOMAX) capsule 0.8 mg (has no administration in time range)  loratadine (CLARITIN) tablet 10 mg (has no administration in time range)  0.9 %  sodium chloride infusion (has no administration in time range)  acetaminophen (TYLENOL) tablet 650 mg (has no administration in time range)    Or  acetaminophen (TYLENOL) suppository 650 mg (has no administration in time range)  ondansetron (ZOFRAN) tablet 4 mg (has no administration in time range)    Or  ondansetron (ZOFRAN) injection 4 mg (has no administration in time range)  aspirin EC tablet 162 mg (has no administration in time range)  cefTRIAXone (ROCEPHIN) 2 g in sodium chloride 0.9 % 100 mL IVPB (0 g Intravenous Stopped 07/05/19 1434)  0.9 %  sodium chloride infusion ( Intravenous New Bag/Given 07/05/19 1351)  midazolam (VERSED) 5 MG/5ML injection (1 mg Intravenous Given 07/05/19 1508)  fentaNYL (SUBLIMAZE) injection (50 mcg Intravenous Given 07/05/19 1509)     ED Discharge Orders    None       Note:  This document was prepared using Dragon voice recognition software and may include unintentional dictation errors.   Duffy Bruce, MD 07/05/19 1700

## 2019-07-05 NOTE — Progress Notes (Signed)
Patient ID: Andrew Myers, male   DOB: [birth year], 74 y.o.   MRN: [id number]  Chief Complaint  Patient presents with  . Empyema    Referred By Dr. Brennan Bailey Reason for Referral empyema right chest  HPI Location, Quality, Duration, Severity, Timing, Context, Modifying Factors, Associated Signs and Symptoms.  Andrew Myers is a 74 y.o. male.  His problems actually began several weeks ago when he experienced some cough some low-grade fevers and occasional productive sputum.  He initially was seen at an urgent care center where chest x-ray was made revealing a possible infiltrate in the left lower lobe.  He was then treated with antibiotics and followed up with.  He ultimately had a repeat chest x-ray made which revealed a right pleural effusion.  A CT scan confirmed the presence of a loculated pleural effusion and yesterday he underwent ultrasound-guided thoracentesis.  This only removed 15 cc total and the Gram stain was positive for gram-positive cocci and the cultures are now growing the same.  The patient states that his fevers actually been a little bit better.  He does not complain of any significant shortness of breath.  Also his cough is improved.  His only prior surgery is a wrist fracture on the right and an appendectomy in the past.  He does take 2 baby aspirin a day for his overall health but no other anticoagulants.  He smoked a pipe in his 34s but has not smoked in many years.   Past Medical History:  Diagnosis Date  . BPH (benign prostatic hyperplasia)   . CAP (community acquired pneumonia) 04/2019  . Empyema lung ([place]) 06/2019  . Hyperlipidemia   . Hypertension   . Pleural effusion on right 05/2019  . Skin cancer    BCC, SCC  . Sleep apnea     Past Surgical History:  Procedure Laterality Date  . APPENDECTOMY    . CATARACT EXTRACTION    . FRACTURE SURGERY      Family History  Problem Relation Age of Onset  . Breast cancer Mother   . Hypertension  Mother   . Hyperlipidemia Mother   . Heart failure Mother   . Heart attack Father   . Thyroid cancer Sister     Social History Social History   Tobacco Use  . Smoking status: Former Smoker    Quit date: 05/07/1967    Years since quitting: 52.1  . Smokeless tobacco: Never Used  Substance Use Topics  . Alcohol use: Yes    Comment: rarely  . Drug use: No    Allergies  Allergen Reactions  . Codeine Rash    Current Facility-Administered Medications  Medication Dose Route Frequency Provider Last Rate Last Admin  . 0.9 %  sodium chloride infusion   Intravenous Once Duffy Bruce, MD      . cefTRIAXone (ROCEPHIN) 2 g in sodium chloride 0.9 % 100 mL IVPB  2 g Intravenous Once Duffy Bruce, MD      . sodium chloride flush (NS) 0.9 % injection 3 mL  3 mL Intravenous Once Duffy Bruce, MD      . vancomycin (VANCOREADY) IVPB 2000 mg/400 mL  2,000 mg Intravenous Once Pearla Dubonnet, [facility]       Current Outpatient Medications  Medication Sig Dispense Refill  . amLODipine (NORVASC) 5 MG tablet Take 5 mg by mouth daily.    Mar[place] Kitchen aspirin EC 81 MG tablet Take 81 mg by mouth daily.    Marland Kitchen atorvastatin (  LIPITOR) 20 MG tablet Take 20 mg by mouth daily.    Marland Kitchen azithromycin (ZITHROMAX) 250 MG tablet Take 1 tablet (250 mg total) by mouth daily. 2 tabs po on day 1, 1 tab po on days 2-5 6 tablet 0  . benzonatate (TESSALON) 200 MG capsule Take 1 capsule (200 mg total) by mouth 3 (three) times daily as needed for cough. 30 capsule 0  . cetirizine (ZYRTEC) 10 MG tablet Take 10 mg by mouth daily.    . hydrochlorothiazide (HYDRODIURIL) 25 MG tablet Take 25 mg by mouth daily.    . nebivolol (BYSTOLIC) 5 MG tablet Take 5 mg by mouth daily.    . sildenafil (REVATIO) 20 MG tablet Take 20 mg by mouth 3 (three) times daily.    . tamsulosin (FLOMAX) 0.4 MG CAPS capsule Take 0.4 mg by mouth daily after supper.    . valsartan (DIOVAN) 160 MG tablet Take by mouth.        Review of Systems A complete review of  systems was asked and was negative except for the following positive findings fever, shortness of breath and cough.  Blood pressure 134/81, pulse 74, temperature 98.3 F (36.8 C), temperature source Oral, resp. rate 16, height 5\' 10"  (1.778 m), weight 103.4 kg, SpO2 96 %.  Physical Exam CONSTITUTIONAL:  Pleasant, well-developed, well-nourished, and in no acute distress. EYES: Pupils equal and reactive to light, Sclera non-icteric EARS, NOSE, MOUTH AND THROAT:  The oropharynx was clear.  Dentition is good repair.  Oral mucosa pink and moist. LYMPH NODES:  Lymph nodes in the neck and axillae were normal RESPIRATORY:  Lungs were clear on the left and slightly diminished at the right base.  Normal respiratory effort without pathologic use of accessory muscles of respiration CARDIOVASCULAR: Heart was regular without murmurs.  There were no carotid bruits. GI: The abdomen was soft, nontender, and nondistended. There were no palpable masses. There was no hepatosplenomegaly. There were normal bowel sounds in all quadrants. GU:  Rectal deferred.   MUSCULOSKELETAL:  Normal muscle strength and tone.  No clubbing or cyanosis.   SKIN:  There were no pathologic skin lesions.  There were no nodules on palpation. NEUROLOGIC:  Sensation is normal.  Cranial nerves are grossly intact. PSYCH:  Oriented to person, place and time.  Mood and affect are normal.  Data Reviewed CT scan and chest x-ray  I have personally reviewed the patient's imaging, laboratory findings and medical records.    Assessment    Right empyema    Plan    I had a long discussion with the patient regarding the management.  I do believe that if we can manage this percutaneously that would be best for him and therefore I have recommended that he be admitted to the hospital be placed on intravenous antibiotics and undergo a image guided pigtail catheter placement for possible intrapleural thrombolytics.  The patient is agreeable.  I did  discuss his care with the emergency room physician as well as Dr. Corrie Dandy.  I also reviewed the x-rays with the patient and explained to him the plan.  He was in agreement.  Thank you very much for this consultation    Nestor Lewandowsky, MD 07/05/2019, 1:29 PM

## 2019-07-05 NOTE — ED Notes (Signed)
Patient speaking with EDP

## 2019-07-05 NOTE — ED Notes (Signed)
Report received from [facility]. Patient care assumed. Patient/RN introduction complete. Will continue to monitor.

## 2019-07-05 NOTE — ED Triage Notes (Signed)
Patient had a thoracentesis yesterday to right lung.  Patient states Pulmonologist called patient last night and told him to come to ED this morning for admission due to pus being present in fluid removed.  Patient is AAOx3.  Skin warm and dry. No SOB/ DOE.  NAD

## 2019-07-05 NOTE — Consult Note (Addendum)
Pharmacy Antibiotic Note  Andrew Myers is a 74 y.o. male admitted on 07/05/2019 with empyema.  Pharmacy has been consulted for Vancomycin and Zosyn dosing. Patient received 2g loading dose of Vancomycin in ED.  Plan: 1) Vancomycin 1000mg  IV every 8 hours per nomogram.  Goal trough 15-20 mcg/mL.  2) Zosyn 3.375g IV q8h (4 hour infusion).  Height: 5\' 10"  (177.8 cm) Weight: 98.9 kg (218 lb) IBW/kg (Calculated) : 73  Temp (24hrs), Avg:98.3 F (36.8 C), Min:98.3 F (36.8 C), Max:98.3 F (36.8 C)  Recent Labs  Lab 07/05/19 1036  WBC 11.5*  CREATININE 0.60*  LATICACIDVEN 1.9    Estimated Creatinine Clearance: 98.5 mL/min (A) (by C-G formula based on SCr of 0.6 mg/dL (L)).    Allergies  Allergen Reactions  . Codeine Rash    Antimicrobials this admission: Vancomycin 5/12 >>  Zosyn 5/12 >>   Microbiology results: 5/12 BCx: pending 5/12 Fungus Cx: pending    Thank you for allowing pharmacy to be a part of this patient's care.  Pearla Dubonnet 07/05/2019 3:58 PM

## 2019-07-06 ENCOUNTER — Inpatient Hospital Stay: Payer: PPO

## 2019-07-06 LAB — CBC
HCT: 36 % — ABNORMAL LOW (ref 39.0–52.0)
Hemoglobin: 12.2 g/dL — ABNORMAL LOW (ref 13.0–17.0)
MCH: 28.4 pg (ref 26.0–34.0)
MCHC: 33.9 g/dL (ref 30.0–36.0)
MCV: 83.7 fL (ref 80.0–100.0)
Platelets: 257 10*3/uL (ref 150–400)
RBC: 4.3 MIL/uL (ref 4.22–5.81)
RDW: 13.4 % (ref 11.5–15.5)
WBC: 10.3 10*3/uL (ref 4.0–10.5)
nRBC: 0 % (ref 0.0–0.2)

## 2019-07-06 LAB — BASIC METABOLIC PANEL
Anion gap: 8 (ref 5–15)
BUN: 10 mg/dL (ref 8–23)
CO2: 26 mmol/L (ref 22–32)
Calcium: 8.5 mg/dL — ABNORMAL LOW (ref 8.9–10.3)
Chloride: 103 mmol/L (ref 98–111)
Creatinine, Ser: 0.59 mg/dL — ABNORMAL LOW (ref 0.61–1.24)
GFR calc Af Amer: >60 mL/min (ref >60)
GFR calc non Af Amer: >60 mL/min (ref >60)
Glucose, Bld: 123 mg/dL — ABNORMAL HIGH (ref 70–99)
Potassium: 3.5 mmol/L (ref 3.5–5.1)
Sodium: 137 mmol/L (ref 135–145)

## 2019-07-06 LAB — MRSA PCR SCREENING: MRSA by PCR: NEGATIVE

## 2019-07-06 LAB — ACID FAST SMEAR (AFB, MYCOBACTERIA): Acid Fast Smear: NEGATIVE

## 2019-07-06 LAB — PROCALCITONIN: Procalcitonin: <0.1 ng/mL

## 2019-07-06 MED ORDER — SODIUM CHLORIDE (PF) 0.9 % IJ SOLN
Freq: Once | INTRAMUSCULAR | Status: AC
  Filled 2019-07-06: qty 10

## 2019-07-06 MED ORDER — VANCOMYCIN HCL 1250 MG/250ML IV SOLN
1250.0000 mg | Freq: Two times a day (BID) | INTRAVENOUS | Status: DC
  Filled 2019-07-06: qty 250

## 2019-07-06 NOTE — Progress Notes (Signed)
Patient ID: Andrew Myers, male   DOB: Feb 20, 1946, 74 y.o.   MRN: [id number]   10 mg of intrapleural TPA was administered via sterile technique using a 22-gauge needle.  The patient tolerated the procedure well.  The tube was clamped and will remain clamped until 3:00.  At that point over then placed back to 20 cm of water suction.

## 2019-07-06 NOTE — Consult Note (Signed)
NAME: Andrew Myers  DOB: [birth year]  MRN: [id number]  Date/Time: 07/06/2019 12:28 PM  REQUESTING PROVIDER: Dr. Francine Graven Subjective:  REASON FOR CONSULT: Empyema History from patient ? Andrew Myers is a 74 y.o. male with a history of CAD, hyperlipidemia, hypertension, sleep apnea was sent to the hospital because of empyema. Patient has had a dry cough since February.  After 3 weeks of having the cough he went to urgent care and was given a course of antibiotic for a chest x-ray that showed right-sided infiltrate..  It did not help much and he continued to have cough and also pain in the side of the chest.  He went to his PCP and got a chest x-ray which showed pleural effusion on the right side..  So he was given another course of antibiotics and also prednisone. Andrew Myers He got his first dose of mRNA vaccine in February and a second dose in March.      patient had taken Levaquin for 7 days, Zithromax for 5 days, Augmentin for 7 days.  He also had intermittent fever.  His PCP ordered a CT chest which showed loculated pleural effusion on the right side.  He was sent to pulmonologist who saw him on 07/04/2019 and and he was sent to the interventional radiologist for aspiration.  This was on 07/04/2019 and as the  cell count was predominantly neutrophils he was asked to go to the hospital and got admitted yesterday.  He has been started on IV Zosyn and IV vancomycin and I am seeing the patient for the same. The culture from 07/04/2019 shows gram-positive cocci but it is not MRSA. Patient patient does not have trouble swallowing. He has poor dentition and is going to be seeing the dentist.  Past Medical History:  Diagnosis Date  . BPH (benign prostatic hyperplasia)   . CAP (community acquired pneumonia) 04/2019  . Empyema lung ([place]) 06/2019  . Hyperlipidemia   . Hypertension   . Obesity   . Pleural effusion on right 05/2019  . Skin cancer    BCC, SCC  . Sleep apnea   . Sleep apnea     Past  Surgical History:  Procedure Laterality Date  . APPENDECTOMY    . CATARACT EXTRACTION    . FRACTURE SURGERY      Social History   Socioeconomic History  . Marital status: Married    Spouse name: Not on file  . Number of children: Not on file  . Years of education: Not on file  . Highest education level: Not on file  Occupational History  . Not on file  Tobacco Use  . Smoking status: Former Smoker    Quit date: 05/07/1967    Years since quitting: 52.2  . Smokeless tobacco: Never Used  Substance and Sexual Activity  . Alcohol use: Yes    Comment: rarely  . Drug use: No  . Sexual activity: Not on file  Other Topics Concern  . Not on file  Social History Narrative  . Not on file   Social Determinants of Health   Financial Resource Strain:   . Difficulty of Paying Living Expenses:   Food Insecurity:   . Worried About Charity fundraiser in the Last Year:   . Arboriculturist in the Last Year:   Transportation Needs:   . Film/video editor (Medical):   Andrew Myers Lack of Transportation (Non-Medical):   Physical Activity:   . Days of Exercise per Week:   .  Minutes of Exercise per Session:   Stress:   . Feeling of Stress :   Social Connections:   . Frequency of Communication with Friends and Family:   . Frequency of Social Gatherings with Friends and Family:   . Attends Religious Services:   . Active Member of Clubs or Organizations:   . Attends Archivist Meetings:   Andrew Myers Marital Status:   Intimate Partner Violence:   . Fear of Current or Ex-Partner:   . Emotionally Abused:   Andrew Myers Physically Abused:   . Sexually Abused:     Family History  Problem Relation Age of Onset  . Breast cancer Mother   . Hypertension Mother   . Hyperlipidemia Mother   . Heart failure Mother   . Heart attack Father   . Thyroid cancer Sister    Allergies  Allergen Reactions  . Codeine Rash    ? Current Facility-Administered Medications  Medication Dose Route Frequency Provider  Last Rate Last Admin  . 0.9 %  sodium chloride infusion   Intravenous Continuous Agbata, Tochukwu, MD      . acetaminophen (TYLENOL) tablet 650 mg  650 mg Oral Q6H PRN Agbata, Tochukwu, MD       Or  . acetaminophen (TYLENOL) suppository 650 mg  650 mg Rectal Q6H PRN Agbata, Tochukwu, MD      . amLODipine (NORVASC) tablet 5 mg  5 mg Oral Daily Agbata, Tochukwu, MD   5 mg at 07/06/19 1125  . aspirin EC tablet 162 mg  162 mg Oral Daily Lu Duffel, [place]   162 mg at 07/06/19 1127  . atorvastatin (LIPITOR) tablet 20 mg  20 mg Oral Daily Agbata, Tochukwu, MD      . hydrochlorothiazide (HYDRODIURIL) tablet 25 mg  25 mg Oral Daily Agbata, Tochukwu, MD   25 mg at 07/06/19 1126  . irbesartan (AVAPRO) tablet 37.5 mg  37.5 mg Oral Daily Agbata, Tochukwu, MD   37.5 mg at 07/06/19 1125  . loratadine (CLARITIN) tablet 10 mg  10 mg Oral Daily Agbata, Tochukwu, MD   10 mg at 07/06/19 1127  . nebivolol (BYSTOLIC) tablet 5 mg  5 mg Oral Daily Agbata, Tochukwu, MD   5 mg at 07/06/19 1125  . ondansetron (ZOFRAN) tablet 4 mg  4 mg Oral Q6H PRN Agbata, Tochukwu, MD       Or  . ondansetron (ZOFRAN) injection 4 mg  4 mg Intravenous Q6H PRN Agbata, Tochukwu, MD      . piperacillin-tazobactam (ZOSYN) IVPB 3.375 g  3.375 g Intravenous Q8H Nazari, Walid A, RPH 12.5 mL/hr at 07/06/19 0641 3.375 g at 07/06/19 0641  . sildenafil (REVATIO) tablet 20 mg  20 mg Oral TID Agbata, Tochukwu, MD   20 mg at 07/06/19 1125  . sodium chloride flush (NS) 0.9 % injection 3 mL  3 mL Intravenous Once Duffy Bruce, MD      . tamsulosin (FLOMAX) capsule 0.8 mg  0.8 mg Oral QPC supper Agbata, Tochukwu, MD      . vancomycin (VANCOREADY) IVPB 1250 mg/250 mL  1,250 mg Intravenous Q12H Eleonore Chiquito S, RPH         Abtx:  Anti-infectives (From admission, onward)   Start     Dose/Rate Route Frequency Ordered Stop   07/06/19 1800  vancomycin (VANCOREADY) IVPB 1250 mg/250 mL     1,250 mg 166.7 mL/hr over 90 Minutes Intravenous Every 12  hours 07/06/19 0859     07/06/19 0600  vancomycin (VANCOCIN) IVPB  1000 mg/200 mL premix  Status:  Discontinued     1,000 mg 200 mL/hr over 60 Minutes Intravenous Every 8 hours 07/05/19 1705 07/06/19 0859   07/05/19 2200  piperacillin-tazobactam (ZOSYN) IVPB 3.375 g     3.375 g 12.5 mL/hr over 240 Minutes Intravenous Every 8 hours 07/05/19 1705     07/05/19 1245  vancomycin (VANCOREADY) IVPB 2000 mg/400 mL     2,000 mg 200 mL/hr over 120 Minutes Intravenous  Once 07/05/19 1238 07/05/19 1850   07/05/19 1230  cefTRIAXone (ROCEPHIN) 2 g in sodium chloride 0.9 % 100 mL IVPB     2 g 200 mL/hr over 30 Minutes Intravenous  Once 07/05/19 1229 07/05/19 1434      REVIEW OF SYSTEMS:  Const:  fever,  chills, negative weight loss Eyes: negative diplopia or visual changes, negative eye pain ENT: negative coryza, negative sore throat Resp: cough,  dyspnea Cards: chest pain, no palpitations, lower extremity edema GU: negative for frequency, dysuria and hematuria GI: Negative for abdominal pain, diarrhea, bleeding, constipation Skin: negative for rash and pruritus Heme: negative for easy bruising and gum/nose bleeding MS: negative for myalgias, arthralgias, back pain and muscle weakness Neurolo:negative for headaches, dizziness, vertigo, memory problems  Psych: negative for feelings of anxiety, depression  Endocrine: No diabetes Allergy/Immunology as above Objective:  VITALS:  BP 135/60 (BP Location: Right Arm)   Pulse 73   Temp 97.8 F (36.6 C) (Oral)   Resp (!) 27   Ht 5\' 11"  (1.803 m)   Wt 100 kg   SpO2 95%   BMI 30.74 kg/m  PHYSICAL EXAM:  General: Alert, cooperative, no distress, appears stated age.  Head: Normocephalic, without obvious abnormality, atraumatic. Eyes: Conjunctivae clear, anicteric sclerae. Pupils are equal ENT Nares normal. No drainage or sinus tenderness. Lips, mucosa, and tongue normal. No Thrush Neck: Supple, symmetrical, no adenopathy, thyroid: non tender no  carotid bruit and no JVD. Back: No CVA tenderness. Lungs: Right-sided chest drain.  Decreased air entry on the right side Heart: Regular rate and rhythm, no murmur, rub or gallop. Abdomen: Soft, non-tender,not distended. Bowel sounds normal. No masses Extremities: atraumatic, no cyanosis. No edema. No clubbing Skin: No rashes or lesions. Or bruising Lymph: Cervical, supraclavicular normal. Neurologic: Grossly non-focal Pertinent Labs Lab Results CBC    Component Value Date/Time   WBC 10.3 07/06/2019 0502   RBC 4.30 07/06/2019 0502   HGB 12.2 (L) 07/06/2019 0502   HCT 36.0 (L) 07/06/2019 0502   PLT 257 07/06/2019 0502   MCV 83.7 07/06/2019 0502   MCH 28.4 07/06/2019 0502   MCHC 33.9 07/06/2019 0502   RDW 13.4 07/06/2019 0502   LYMPHSABS 1.8 07/05/2019 1036   MONOABS 0.9 07/05/2019 1036   EOSABS 0.2 07/05/2019 1036   BASOSABS 0.1 07/05/2019 1036    CMP Latest Ref Rng & Units 07/06/2019 07/05/2019  Glucose 70 - 99 mg/dL 123(H) 150(H)  BUN 8 - 23 mg/dL 10 10  Creatinine 0.61 - 1.24 mg/dL 0.59(L) 0.60(L)  Sodium 135 - 145 mmol/L 137 135  Potassium 3.5 - 5.1 mmol/L 3.5 3.4(L)  Chloride 98 - 111 mmol/L 103 97(L)  CO2 22 - 32 mmol/L 26 26  Calcium 8.9 - 10.3 mg/dL 8.5(L) 9.1  Total Protein 6.5 - 8.1 g/dL - 8.1  Total Bilirubin 0.3 - 1.2 mg/dL - 0.8  Alkaline Phos 38 - 126 U/L - 57  AST 15 - 41 U/L - 24  ALT 0 - 44 U/L - 30  Microbiology: Recent Results (from the past 240 hour(s))  SARS Coronavirus 2 by RT PCR (hospital order, performed in [facility] hospital lab) Nasopharyngeal Nasopharyngeal Swab     Status: None   Collection Time: 07/04/19 10:41 AM   Specimen: Nasopharyngeal Swab  Result Value Ref Range Status   SARS Coronavirus 2 NEGATIVE NEGATIVE Final    Comment: (NOTE) SARS-CoV-2 target nucleic acids are NOT DETECTED. The SARS-CoV-2 RNA is generally detectable in upper and lower respiratory specimens during the acute phase of infection. The  lowest concentration of SARS-CoV-2 viral copies this assay can detect is 250 copies / mL. A negative result does not preclude SARS-CoV-2 infection and should not be used as the sole basis for treatment or other patient management decisions.  A negative result may occur with improper specimen collection / handling, submission of specimen other than nasopharyngeal swab, presence of viral mutation(s) within the areas targeted by this assay, and inadequate number of viral copies (<250 copies / mL). A negative result must be combined with clinical observations, patient history, and epidemiological information. Fact Sheet for Patients:   StrictlyIdeas.no Fact Sheet for Healthcare Providers: BankingDealers.co.za This test is not yet approved or cleared  by the [place] FDA and has been authorized for detection and/or diagnosis of SARS-CoV-2 by FDA under an Emergency Use Authorization (EUA).  This EUA will remain in effect (meaning this test can be used) for the duration of the COVID-19 declaration under Section 564(b)(1) of the Act, 21 U.S.C. section 360bbb-3(b)(1), unless the authorization is terminated or revoked sooner. Performed at [facility], [address] [place], [place] [address]   Body fluid culture     Status: None (Preliminary result)   Collection Time: 07/04/19  1:00 PM   Specimen: PATH Cytology Pleural fluid  Result Value Ref Range Status   Specimen Description   Final    PLEURAL Performed at [facility], [address], [place], [place] [address]    Special Requests   Final    NONE Performed at [facility], [address] [place], [place] [address]    Gram Stain   Final    FEW WBC PRESENT,BOTH PMN AND MONONUCLEAR FEW GRAM POSITIVE COCCI    Culture   Final    NO GROWTH 2 DAYS Performed at [facility], [place] [address], [place], [place] [address]    Report Status PENDING   Incomplete  Acid Fast Smear (AFB)     Status: None   Collection Time: 07/04/19  1:00 PM   Specimen: PATH Cytology Pleural fluid  Result Value Ref Range Status   AFB Specimen Processing Concentration  Final   Acid Fast Smear Negative  Final    Comment: (NOTE) Performed At: [facility] [address] [place], [place] [id number] Rush Farmer MD [id number]    Source (AFB) PLEURAL  Final    Comment: Performed at [facility], [address] [place], [place] [address]  Blood culture (routine x 2)     Status: None (Preliminary result)   Collection Time: 07/05/19  1:14 PM   Specimen: BLOOD  Result Value Ref Range Status   Specimen Description BLOOD LEFT ANTECUBITAL  Final   Special Requests   Final    BOTTLES DRAWN AEROBIC AND ANAEROBIC Blood Culture results may not be optimal due to an excessive volume of blood received in culture bottles   Culture   Final    NO GROWTH < 24 HOURS Performed at [facility], [place], [place] [address]    Report Status PENDING  Incomplete  Blood culture (routine x 2)     Status: None (Preliminary result)   Collection Time: 07/05/19  1:14 PM   Specimen: BLOOD  Result Value Ref Range Status   Specimen Description BLOOD RIGHT ANTECUBITAL  Final   Special Requests   Final    BOTTLES DRAWN AEROBIC AND ANAEROBIC Blood Culture results may not be optimal due to an inadequate volume of blood received in culture bottles   Culture   Final    NO GROWTH < 24 HOURS Performed at [facility], [address], [place], [place] [address]    Report Status PENDING  Incomplete  Aerobic/Anaerobic Culture (surgical/deep wound)     Status: None (Preliminary result)   Collection Time: 07/05/19  3:45 PM   Specimen: Fluid; Abscess  Result Value Ref Range Status   Specimen Description FLUID RIGHT PLEURAL  Final   Special Requests NONE  Final   Gram Stain   Final    RARE WBC PRESENT,BOTH PMN AND MONONUCLEAR NO  ORGANISMS SEEN    Culture   Final    NO GROWTH < 24 HOURS Performed at [facility], [place] [address] [place], [place] [address]    Report Status PENDING  Incomplete  MRSA PCR Screening     Status: None   Collection Time: 07/05/19 11:05 PM  Result Value Ref Range Status   MRSA by PCR NEGATIVE NEGATIVE Final    Comment:        The GeneXpert MRSA Assay (FDA approved for NASAL specimens only), is one component of a comprehensive MRSA colonization surveillance program. It is not intended to diagnose MRSA infection nor to guide or monitor treatment for MRSA infections. Performed at [facility], [address] [place], [place] [address]     IMAGING RESULTS:  I have personally reviewed the films ? Impression/Recommendation ?Right-sided empyema. Status post chest drain. Seen by thoracic surgeon and pulmonologist Had TPA instilled into the chest drain. He is currently on vancomycin and Zosyn.  As culture tests shows gram-positive cocci but not MRSA we will discontinue vancomycin.  May be able to de-escalate depending on the culture result.  He failed 3 oral antibiotics but that may have been because he had a loculated empyema.  We will decide on duration and route of antibiotic once culture is finalized.  Hypertension on amlodipine and HCTZ and irbesartan  Hyperlipidemia on atorvastatin ? ?I am not sure why he is on Revatio 20 mg 3 times daily. Discussed with pharmacist and he will check with primary team as this is not the dose for ED and there is no mention of pulmonary HTN in the chart ___________________________________________________ Discussed with patient, requesting provider Note:  This document was prepared using Dragon voice recognition software and may include unintentional dictation errors.

## 2019-07-06 NOTE — Plan of Care (Signed)

## 2019-07-06 NOTE — ED Notes (Signed)
Pt placed on bipap per RT, pt is on it at home qhs.

## 2019-07-06 NOTE — ED Notes (Signed)
Report called to [place] RN, pt admitted to 237

## 2019-07-06 NOTE — Progress Notes (Signed)
PROGRESS NOTE    Andrew Myers  [name] DOB: [birth year] DOA: 07/05/2019 PCP: Derinda Late, MD    Brief Narrative:  Andrew Myers is a 74 y.o. male with medical history significant of recent pna, htn, hyperglycemia, obesity, sleep apnea sent to ED by pulmonology for treatment of empyema.  Patient reported in March of this year he was treated for LLL infiltrate.  He completed 3 rounds antibiotics and steroids. By 4/29 he remained symptomatic.  Associated symptoms include intermittent fevers with max temp 99.9, intermittent right chest stabbing pain worse with deep breath some mild shortness of breath and general malaise.  He denies headache dizziness syncope or near syncope.  He denies chest pain palpitations lower extremity edema.  He denies abdominal pain nausea vomiting diarrhea constipation melena bright red blood per rectum.  He denies any dysuria hematuria frequency or urgency.  CT chest revealed pleural effusion. Underwent thoracentesis 07/04/19 yielding 67ml fluid. Fluid studies revealed gram +bacteria concerning for empyema. Patient was called at home and instructed to come to ED for admission.    Consultants:   ID, pulmonary, CT surgery  Procedures: Chest tube  Antimicrobials:   Zosyn Vanco   Subjective: Feels a little better.  No complaints  Objective: Vitals:   07/06/19 0259 07/06/19 0500 07/06/19 0612 07/06/19 0700  BP:   135/60   Pulse:  75 68 73  Resp: 20 (!) 25 (!) 21 (!) 28  Temp:   97.8 F (36.6 C)   TempSrc:   Oral   SpO2:  95% 95% 94%  Weight:      Height:        Intake/Output Summary (Last 24 hours) at 07/06/2019 0831 Last data filed at 07/06/2019 [id number] Gross per 24 hour  Intake 213 ml  Output 835 ml  Net -622 ml   Filed Weights   07/05/19 1025 07/05/19 1442 07/06/19 0225  Weight: 103.4 kg 98.9 kg 100 kg    Examination:  General exam: Appears calm and comfortable, NAD, pleasant Respiratory system: Clear to auscultation.  Respiratory effort normal. Right chest tube in place draining Cardiovascular system: S1 & S2 heard, RRR. No JVD, murmurs, rubs, gallops or clicks.  Gastrointestinal system: Abdomen is nondistended, soft and nontender. Normal bowel sounds heard. Central nervous system: Alert and oriented.  Grossly intact Extremities: No edema Skin: Warm dry Psychiatry: Judgement and insight appear normal. Mood & affect appropriate.     Data Reviewed: I have personally reviewed following labs and imaging studies  CBC: Recent Labs  Lab 07/05/19 1036 07/06/19 0502  WBC 11.5* 10.3  NEUTROABS 8.5*  --   HGB 13.4 12.2*  HCT 39.5 36.0*  MCV 84.4 83.7  PLT 308 [id number]   Basic Metabolic Panel: Recent Labs  Lab 07/05/19 1036 07/06/19 0502  NA 135 137  K 3.4* 3.5  CL 97* 103  CO2 26 26  GLUCOSE 150* 123*  BUN 10 10  CREATININE 0.60* 0.59*  CALCIUM 9.1 8.5*   GFR: Estimated Creatinine Clearance: 100.6 mL/min (A) (by C-G formula based on SCr of 0.59 mg/dL (L)). Liver Function Tests: Recent Labs  Lab 07/05/19 1036  AST 24  ALT 30  ALKPHOS 57  BILITOT 0.8  PROT 8.1  ALBUMIN 3.5   No results for input(s): LIPASE, AMYLASE in the last 168 hours. No results for input(s): AMMONIA in the last 168 hours. Coagulation Profile: No results for input(s): INR, PROTIME in the last 168 hours. Cardiac Enzymes: No results for input(s): CKTOTAL, CKMB, CKMBINDEX, TROPONINI  in the last 168 hours. BNP (last 3 results) No results for input(s): PROBNP in the last 8760 hours. HbA1C: No results for input(s): HGBA1C in the last 72 hours. CBG: No results for input(s): GLUCAP in the last 168 hours. Lipid Profile: No results for input(s): CHOL, HDL, LDLCALC, TRIG, CHOLHDL, LDLDIRECT in the last 72 hours. Thyroid Function Tests: No results for input(s): TSH, T4TOTAL, FREET4, T3FREE, THYROIDAB in the last 72 hours. Anemia Panel: No results for input(s): VITAMINB12, FOLATE, FERRITIN, TIBC, IRON, RETICCTPCT in the  last 72 hours. Sepsis Labs: Recent Labs  Lab 07/05/19 1036 07/06/19 0502  PROCALCITON <0.10 <0.10  LATICACIDVEN 1.9  --     Recent Results (from the past 240 hour(s))  SARS Coronavirus 2 by RT PCR (hospital order, performed in [facility] hospital lab) Nasopharyngeal Nasopharyngeal Swab     Status: None   Collection Time: 07/04/19 10:41 AM   Specimen: Nasopharyngeal Swab  Result Value Ref Range Status   SARS Coronavirus 2 NEGATIVE NEGATIVE Final    Comment: (NOTE) SARS-CoV-2 target nucleic acids are NOT DETECTED. The SARS-CoV-2 RNA is generally detectable in upper and lower respiratory specimens during the acute phase of infection. The lowest concentration of SARS-CoV-2 viral copies this assay can detect is 250 copies / mL. A negative result does not preclude SARS-CoV-2 infection and should not be used as the sole basis for treatment or other patient management decisions.  A negative result may occur with improper specimen collection / handling, submission of specimen other than nasopharyngeal swab, presence of viral mutation(s) within the areas targeted by this assay, and inadequate number of viral copies (<250 copies / mL). A negative result must be combined with clinical observations, patient history, and epidemiological information. Fact Sheet for Patients:   StrictlyIdeas.no Fact Sheet for Healthcare Providers: BankingDealers.co.za This test is not yet approved or cleared  by the [place] FDA and has been authorized for detection and/or diagnosis of SARS-CoV-2 by FDA under an Emergency Use Authorization (EUA).  This EUA will remain in effect (meaning this test can be used) for the duration of the COVID-19 declaration under Section 564(b)(1) of the Act, 21 U.S.C. section 360bbb-3(b)(1), unless the authorization is terminated or revoked sooner. Performed at [facility], [address] [place], [place]  [address]   Body fluid culture     Status: None (Preliminary result)   Collection Time: 07/04/19  1:00 PM   Specimen: PATH Cytology Pleural fluid  Result Value Ref Range Status   Specimen Description   Final    PLEURAL Performed at [facility], [address] [place], [place] [address]    Special Requests   Final    NONE Performed at [facility], [address] [place], [place] [address]    Gram Stain   Final    FEW WBC PRESENT,BOTH PMN AND MONONUCLEAR FEW GRAM POSITIVE COCCI    Culture   Final    NO GROWTH < 12 HOURS Performed at [facility], [place] [address] [place], [place] [address]    Report Status PENDING  Incomplete  Blood culture (routine x 2)     Status: None (Preliminary result)   Collection Time: 07/05/19  1:14 PM   Specimen: BLOOD  Result Value Ref Range Status   Specimen Description BLOOD LEFT ANTECUBITAL  Final   Special Requests   Final    BOTTLES DRAWN AEROBIC AND ANAEROBIC Blood Culture results may not be optimal due to an excessive volume of  blood received in culture bottles   Culture   Final    NO GROWTH < 24 HOURS Performed at [facility], [address] [place], [place] [address]    Report Status PENDING  Incomplete  Blood culture (routine x 2)     Status: None (Preliminary result)   Collection Time: 07/05/19  1:14 PM   Specimen: BLOOD  Result Value Ref Range Status   Specimen Description BLOOD RIGHT ANTECUBITAL  Final   Special Requests   Final    BOTTLES DRAWN AEROBIC AND ANAEROBIC Blood Culture results may not be optimal due to an inadequate volume of blood received in culture bottles   Culture   Final    NO GROWTH < 24 HOURS Performed at [facility], [address] [place], [place] [address]    Report Status PENDING  Incomplete  Aerobic/Anaerobic Culture (surgical/deep wound)     Status: None (Preliminary result)   Collection Time: 07/05/19  3:45 PM   Specimen: Fluid; Abscess  Result Value Ref  Range Status   Specimen Description FLUID RIGHT PLEURAL  Final   Special Requests NONE  Final   Gram Stain   Final    RARE WBC PRESENT,BOTH PMN AND MONONUCLEAR NO ORGANISMS SEEN Performed at [facility], [address], [place], [place] [address]    Culture PENDING  Incomplete   Report Status PENDING  Incomplete  MRSA PCR Screening     Status: None   Collection Time: 07/05/19 11:05 PM  Result Value Ref Range Status   MRSA by PCR NEGATIVE NEGATIVE Final    Comment:        The GeneXpert MRSA Assay (FDA approved for NASAL specimens only), is one component of a comprehensive MRSA colonization surveillance program. It is not intended to diagnose MRSA infection nor to guide or monitor treatment for MRSA infections. Performed at [facility], [address], [place], [place] [address]          Radiology Studies: DG Chest 1 View  Result Date: 07/04/2019 CLINICAL DATA:  Status post right thoracentesis. EXAM: CHEST  1 VIEW COMPARISON:  CT of the chest on 06/29/2019 FINDINGS: The heart size and mediastinal contours are within normal limits. Residual lateral/posterolateral component of loculated pleural fluid remains on the right. No pneumothorax after right thoracentesis. No pulmonary edema. The visualized skeletal structures are unremarkable. IMPRESSION: No pneumothorax after right sided thoracentesis. Residual lateral/posterolateral component of loculated right pleural fluid remains. Electronically Signed   By: Aletta Edouard M.D.   On: 07/04/2019 14:26   CT IMAGE GUIDED DRAINAGE BY PERCUTANEOUS CATHETER  Result Date: 07/05/2019 INDICATION: Right empyema EXAM: CT-GUIDED PLACEMENT RIGHT 10 FRENCH CHEST TUBE MEDICATIONS: The patient is currently admitted to the hospital and receiving intravenous antibiotics. The antibiotics were administered within an appropriate time frame prior to the initiation of the procedure. ANESTHESIA/SEDATION: Fentanyl 50 mcg IV; Versed 1.0 mg  IV Moderate Sedation Time:  11 MINUTES The patient was continuously monitored during the procedure by the interventional radiology nurse under my direct supervision. COMPLICATIONS: None immediate. PROCEDURE: Informed written consent was obtained from the patient after a thorough discussion of the procedural risks, benefits and alternatives. All questions were addressed. Maximal Sterile Barrier Technique was utilized including caps, mask, sterile gowns, sterile gloves, sterile drape, hand hygiene and skin antiseptic. A timeout was performed prior to the initiation of the procedure. Previous imaging reviewed. Patient positioned right anterior oblique. Noncontrast localization CT performed. The loculated right effusion was localized  and marked for a lateral posterior approach. Under sterile conditions and local anesthesia, 18 gauge introducer needle was advanced from a posterolateral lower intercostal approach into the pleural fluid. Needle position confirmed with CT. Guidewire inserted followed by tract dilatation insert a 10 French drain. Drain catheter position confirmed with CT. Syringe aspiration yielded 10 cc of thick mucinous exudative fluid. Sample sent for pan culture. Catheter secured with Prolene suture and connected to external pleura vac. Sterile dressing applied. No immediate complication. Patient tolerated the procedure well. IMPRESSION: Successful CT-guided 10 French right chest tube insertion. Electronically Signed   By: Jerilynn Mages.  Shick M.D.   On: 07/05/2019 15:44   US THORACENTESIS ASP PLEURAL SPACE W/IMG GUIDE  Result Date: 07/04/2019 CLINICAL DATA:  Loculated right pleural effusion. EXAM: ULTRASOUND GUIDED RIGHT THORACENTESIS COMPARISON:  CT of the chest on 06/29/2019 PROCEDURE: An ultrasound guided thoracentesis was thoroughly discussed with the patient and questions answered. The benefits, risks, alternatives and complications were also discussed. The patient understands and wishes to proceed with  the procedure. Written consent was obtained. Ultrasound was performed to localize and mark an adequate pocket of fluid in the right chest. The area was then prepped and draped in the normal sterile fashion. 1% Lidocaine was used for local anesthesia. Under ultrasound guidance a 6 French Safe-T-Centesis catheter was introduced. Fluid was removed from the catheter. The catheter was then removed. Under ultrasound guidance, a 5 [place] Yueh centesis catheter was then introduced in a slightly higher pocket of pleural fluid in the right posterior chest. Aspiration was performed through the centesis catheter. The catheter was removed and a dressing applied. FINDINGS: Ultrasound shows a very complex appearing and loculated right pleural effusion with relatively small components posteriorly. From the first pocket sampled in the right posterior pleural space towards the lung base, only 10 mL of clear appearing amber fluid was able to be aspirated. The sample was sent for various requested labs. Decision was made to sample a second slightly higher pocket yielding 5 mL of grossly purulent whitish green thick fluid. This sample was sent for culture analysis. IMPRESSION: Highly loculated and complex right pleural effusion with relatively small pockets posteriorly. The first pocket yielded 10 mL of clear appearing amber fluid. The second more superior pocket yielded 5 mL of grossly purulent fluid likely consistent with empyema. Fluid analysis will be performed on both samples. Electronically Signed   By: Aletta Edouard M.D.   On: 07/04/2019 14:24        Scheduled Meds: . amLODipine  5 mg Oral Daily  . aspirin EC  162 mg Oral Daily  . atorvastatin  20 mg Oral Daily  . hydrochlorothiazide  25 mg Oral Daily  . irbesartan  37.5 mg Oral Daily  . loratadine  10 mg Oral Daily  . nebivolol  5 mg Oral Daily  . sildenafil  20 mg Oral TID  . sodium chloride flush  3 mL Intravenous Once  . tamsulosin  0.8 mg Oral QPC supper    Continuous Infusions: . sodium chloride    . piperacillin-tazobactam (ZOSYN)  IV 3.375 g (07/06/19 0641)  . vancomycin 1,000 mg (07/06/19 [id number])    Assessment & Plan:   Principal Problem:   Empyema ([place]) Active Problems:   Hypertension   Hyperlipidemia   BPH (benign prostatic hyperplasia)   Hypokalemia   Obesity   Sleep apnea   Hyperglycemia   Empyema of right pleural space ([place])   #1.  Empyema.  Patient underwent a thoracentesis May 11  and fluid studies positive for gram-positive bacteria.  He was instructed to come to the emergency department for admission/treatment. -Continue IV antibiotics. Will place patient on Vancomycin and Zosyn per pharmacy dosing -Follow blood cultures -Gentle IV fluids -Supportive therapy -Chest tube placed, s/p intrapleural thrombolytics (TPA) today. We will follow up infectious disease  #2.  Hypokalemia.  Mild.  Likely related to diuretic  Potassium level 3.4. Resolved we will continue to monitor  #3.  Hypertension.  Stable in the emergency department.  Home medications include amlodipine, hydrochlorothiazide, Bystolic, valsartan. -Resumed home meds -Monitor  #4.  Sleep apnea.  Patient reports he uses a home BiPAP. -Respiratory consult for device  #5.  BPH.  Stable at baseline. -Continue home meds  #6.  Obesity.  BMI 32. -nutritional consult  #7.  Hyperlipidemia.  Medications include a statin -New home meds  #8.  Hyperglycemia. No hx of same. Likely related to recent  steroid use.  Serum glucose 150. -   #9. Pulmonary Hypertension Continue Revatio   DVT prophylaxis: SCD Code Status: Full Family Communication: None at bedside Disposition Plan: Back to previous home life Barrier: Has chest tube in place needs further surgical and medical management, currently not medically stable for discharge       LOS: 1 day   Time spent: 45 minutes with more than 50% COC    Nolberto Hanlon, MD [facility] Pager  [phone]  If 7PM-7AM, please contact night-coverage [email or web address] Password [facility] 07/06/2019, 8:31 AM

## 2019-07-06 NOTE — Progress Notes (Signed)
[name] Follow Up Note  Patient ID: Andrew Myers, male   DOB: Jun 03, 1945, 74 y.o.   MRN: [id number]  HISTORY: He tolerated the pigtail catheter well yesterday.  He states that he feels good overall.  He has had no cough and no fevers.    Vitals:   07/06/19 0612 07/06/19 0700  BP: 135/60   Pulse: 68 73  Resp: (!) 21 (!) 28  Temp: 97.8 F (36.6 C)   SpO2: 95% 94%     EXAM:  Resp: Lungs show diminished breath sounds at the right base.  No respiratory distress, normal effort. Heart:  Regular without murmurs Abd:  Abdomen is soft, non distended and non tender. No masses are palpable.  There is no rebound and no guarding.  Neurological: Alert and oriented to person, place, and time. Coordination normal.  Skin: Skin is warm and dry. No rash noted. No diaphoretic. No erythema. No pallor.  Psychiatric: Normal mood and affect. Normal behavior. Judgment and thought content normal.    There is no air leak from the chest tube.  Independent review of his chest x-ray shows no significant pneumothorax  ASSESSMENT: Loculated empyema right chest   PLAN:   I explained to the patient the indications and risks of intrapleural thrombolytics.  He understands risk of bleeding complications that may occur.  He would like [place] to proceed.  I will administer the medication once this is available.    Nestor Lewandowsky, MD

## 2019-07-06 NOTE — Care Management Important Message (Signed)
Important Message  Patient Details  Name: Andrew Myers MRN: [id number] Date of Birth: 04/27/1945   Medicare Important Message Given:  Yes  Initial Medicare IM given by Patient Access Associate on 07/06/2019 at 9:28am.     Dannette Barbara 07/06/2019, 1:57 PM

## 2019-07-06 NOTE — Consult Note (Signed)
Pharmacy Antibiotic Note  Andrew Myers is a 74 y.o. male admitted on 07/05/2019 with empyema.  Pharmacy has been consulted for Vancomycin and Zosyn dosing. Patient received 2g loading dose of Vancomycin in ED.  Plan: 1) Patient received vancomycin 2000 mg x 1, followed by vancomycin 1250 mg IV every 12 hours for a predicted AUC of 478 .  Goal AUC 400-550. Will follow up with cytology pleural fluid culture. IF not MRSA - can d/c vancomycin per Dr. Lanney Gins. MRSA PCR negative.   2) Zosyn 3.375g IV q8h (4 hour infusion).  Height: 5\' 11"  (180.3 cm) Weight: 100 kg (220 lb 6.4 oz) IBW/kg (Calculated) : 75.3  Temp (24hrs), Avg:98.3 F (36.8 C), Min:97.8 F (36.6 C), Max:98.6 F (37 C)  Recent Labs  Lab 07/05/19 1036 07/06/19 0502  WBC 11.5* 10.3  CREATININE 0.60* 0.59*  LATICACIDVEN 1.9  --     Estimated Creatinine Clearance: 100.6 mL/min (A) (by C-G formula based on SCr of 0.59 mg/dL (L)).    Allergies  Allergen Reactions  . Codeine Rash    Antimicrobials this admission: Vancomycin 5/12 >>  Zosyn 5/12 >>   Microbiology results: 5/12 BCx: pending 5/12 Fungus Cx: pending    Thank you for allowing pharmacy to be a part of this patient's care.  Oswald Hillock, PharmD, BCPS 07/06/2019 8:59 AM

## 2019-07-06 NOTE — Progress Notes (Signed)
Pt received to PCU room 237 from Ed.  Pt oriented to room and call bell.  See admit data base, assessment, vs's, and monitor strip.  Security called to come up and secure some of his belongings.  Pt alert and oriented x4, mae, s1s2, NSR w/ 1st degree AVB and BBB.  Skin warm and dry.  No wounds noted.  Pt has chest tube to rt chest flank area draining yellow to blood tinged looking fluid w/ 10cc total output in the collection chamber.  Chest tube connected to 20cm wall suction per charge nurse and ED nurse.  IVF's infusing at 100 cc/hr.  Pt denies pain/sob/distress.  Pt assisted up to bedside to use urinal and in structed to always call when he needs to get up due to high risk of falling.  Pt verbalized understand.

## 2019-07-06 NOTE — Progress Notes (Signed)
Pulmonary Medicine          Date: 07/06/2019,   MRN# [id number] Andrew Myers [birth year]     AdmissionWeight: 103.4 kg                 CurrentWeight: 100 kg   Refering physician:Dr Agbata   CHIEF COMPLAINT:   Pneumonia with Empyema   SUBJECTIVE   -Patient is laying in bed in no distress smiling, reports good sleep overnight no pain around right chest tube insertion site. There is straw colored fluid actively draining at low volume. No air leak was noted during exam of atrium and circuit.    -Antimicrobials discussed with PharmD today, GPC not speciated yet however since MRSApcr is negative we will plan to transition off vancomycin once final results are updated from pleural fluid.    -  PAST MEDICAL HISTORY   Past Medical History:  Diagnosis Date  . BPH (benign prostatic hyperplasia)   . CAP (community acquired pneumonia) 04/2019  . Empyema lung ([place]) 06/2019  . Hyperlipidemia   . Hypertension   . Obesity   . Pleural effusion on right 05/2019  . Skin cancer    BCC, SCC  . Sleep apnea   . Sleep apnea      SURGICAL HISTORY   Past Surgical History:  Procedure Laterality Date  . APPENDECTOMY    . CATARACT EXTRACTION    . FRACTURE SURGERY       FAMILY HISTORY   Family History  Problem Relation Age of Onset  . Breast cancer Mother   . Hypertension Mother   . Hyperlipidemia Mother   . Heart failure Mother   . Heart attack Father   . Thyroid cancer Sister      SOCIAL HISTORY   Social History   Tobacco Use  . Smoking status: Former Smoker    Quit date: 05/07/1967    Years since quitting: 52.2  . Smokeless tobacco: Never Used  Substance Use Topics  . Alcohol use: Yes    Comment: rarely  . Drug use: No     MEDICATIONS    Home Medication:    Current Medication:  Current Facility-Administered Medications:  .  0.9 %  sodium chloride infusion, , Intravenous, Continuous, Agbata, Tochukwu, MD .  acetaminophen (TYLENOL)  tablet 650 mg, 650 mg, Oral, Q6H PRN **OR** acetaminophen (TYLENOL) suppository 650 mg, 650 mg, Rectal, Q6H PRN, Agbata, Tochukwu, MD .  alteplase (tPA) 10mg  in NS 78mL for Dr.Oaks (intrapleural administration/[facility]), , Intrapleural, Once, Nestor Lewandowsky, MD .  amLODipine (NORVASC) tablet 5 mg, 5 mg, Oral, Daily, Agbata, Tochukwu, MD .  aspirin EC tablet 162 mg, 162 mg, Oral, Daily, Shanlever, Pierce Crane, RPH .  atorvastatin (LIPITOR) tablet 20 mg, 20 mg, Oral, Daily, Agbata, Tochukwu, MD .  hydrochlorothiazide (HYDRODIURIL) tablet 25 mg, 25 mg, Oral, Daily, Agbata, Tochukwu, MD .  irbesartan (AVAPRO) tablet 37.5 mg, 37.5 mg, Oral, Daily, Agbata, Tochukwu, MD .  loratadine (CLARITIN) tablet 10 mg, 10 mg, Oral, Daily, Agbata, Tochukwu, MD .  nebivolol (BYSTOLIC) tablet 5 mg, 5 mg, Oral, Daily, Agbata, Tochukwu, MD .  ondansetron (ZOFRAN) tablet 4 mg, 4 mg, Oral, Q6H PRN **OR** ondansetron (ZOFRAN) injection 4 mg, 4 mg, Intravenous, Q6H PRN, Agbata, Tochukwu, MD .  piperacillin-tazobactam (ZOSYN) IVPB 3.375 g, 3.375 g, Intravenous, Q8H, Nazari, Walid A, RPH, Last Rate: 12.5 mL/hr at 07/06/19 0641, 3.375 g at 07/06/19 0641 .  sildenafil (REVATIO) tablet 20 mg, 20 mg, Oral, TID, Agbata,  Tochukwu, MD, 20 mg at 07/05/19 2304 .  sodium chloride flush (NS) 0.9 % injection 3 mL, 3 mL, Intravenous, Once, Duffy Bruce, MD .  tamsulosin (FLOMAX) capsule 0.8 mg, 0.8 mg, Oral, QPC supper, Agbata, Tochukwu, MD .  vancomycin (VANCOREADY) IVPB 1250 mg/250 mL, 1,250 mg, Intravenous, Q12H, Patel, Kishan S, RPH    ALLERGIES   Codeine     REVIEW OF SYSTEMS    Review of Systems:  Gen:  Denies  fever, sweats, chills weigh loss  HEENT: Denies blurred vision, double vision, ear pain, eye pain, hearing loss, nose bleeds, sore throat Cardiac:  No dizziness, chest pain or heaviness, chest tightness,edema Resp:   Denies cough or sputum porduction, shortness of breath,wheezing, hemoptysis,  Gi: Denies swallowing  difficulty, stomach pain, nausea or vomiting, diarrhea, constipation, bowel incontinence Gu:  Denies bladder incontinence, burning urine Ext:   Denies Joint pain, stiffness or swelling Skin: Denies  skin rash, easy bruising or bleeding or hives Endoc:  Denies polyuria, polydipsia , polyphagia or weight change Psych:   Denies depression, insomnia or hallucinations   Other:  All other systems negative   VS: BP 135/60 (BP Location: Right Arm)   Pulse 73   Temp 97.8 F (36.6 C) (Oral)   Resp (!) 28   Ht 5\' 11"  (1.803 m)   Wt 100 kg   SpO2 94%   BMI 30.74 kg/m      PHYSICAL EXAM    GENERAL:NAD, no fevers, chills, no weakness no fatigue HEAD: Normocephalic, atraumatic.  EYES: Pupils equal, round, reactive to light. Extraocular muscles intact. No scleral icterus.  MOUTH: Moist mucosal membrane. Dentition intact. No abscess noted.  EAR, NOSE, THROAT: Clear without exudates. No external lesions.  NECK: Supple. No thyromegaly. No nodules. No JVD.  PULMONARY: Clear to ausculatation bilaterally with Decreased air entry on right CARDIOVASCULAR: S1 and S2. Regular rate and rhythm. No murmurs, rubs, or gallops. No edema. Pedal pulses 2+ bilaterally.  GASTROINTESTINAL: Soft, nontender, nondistended. No masses. Positive bowel sounds. No hepatosplenomegaly.  MUSCULOSKELETAL: No swelling, clubbing, or edema. Range of motion full in all extremities.  NEUROLOGIC: Cranial nerves II through XII are intact. No gross focal neurological deficits. Sensation intact. Reflexes intact.  SKIN: No ulceration, lesions, rashes, or cyanosis. Skin warm and dry. Turgor intact.  PSYCHIATRIC: Mood, affect within normal limits. The patient is awake, alert and oriented x 3. Insight, judgment intact.       IMAGING    DG Chest 1 View  Result Date: 07/04/2019 CLINICAL DATA:  Status post right thoracentesis. EXAM: CHEST  1 VIEW COMPARISON:  CT of the chest on 06/29/2019 FINDINGS: The heart size and mediastinal  contours are within normal limits. Residual lateral/posterolateral component of loculated pleural fluid remains on the right. No pneumothorax after right thoracentesis. No pulmonary edema. The visualized skeletal structures are unremarkable. IMPRESSION: No pneumothorax after right sided thoracentesis. Residual lateral/posterolateral component of loculated right pleural fluid remains. Electronically Signed   By: Aletta Edouard M.D.   On: 07/04/2019 14:26   CT CHEST WO CONTRAST  Result Date: 06/29/2019 CLINICAL DATA:  Pleural effusion, shortness of breath. Follow-up chest x-ray EXAM: CT CHEST WITHOUT CONTRAST TECHNIQUE: Multidetector CT imaging of the chest was performed following the standard protocol without IV contrast. COMPARISON:  Chest x-ray 05/11/2019 FINDINGS: Cardiovascular: Heart is normal size. Aorta normal caliber. Scattered coronary artery and aortic calcifications. Mediastinum/Nodes: No mediastinal, hilar, or axillary adenopathy. Trachea and esophagus are unremarkable. Thyroid unremarkable. Lungs/Pleura: Partially loculated moderate-sized right pleural effusion  noted. Compressive atelectasis or pneumonia in the right lower lobe. Scattered granulomas in the right lung. Left lung clear. Upper Abdomen: Imaging into the upper abdomen shows no acute findings. Musculoskeletal: Chest wall soft tissues are unremarkable. No acute bony abnormality. IMPRESSION: Moderate partially loculated right pleural effusion. Right lower lobe compressive atelectasis versus pneumonia. Electronically Signed   By: Rolm Baptise M.D.   On: 06/29/2019 14:56   DG Chest Port 1 View  Result Date: 07/06/2019 CLINICAL DATA:  Right empyema. EXAM: PORTABLE CHEST 1 VIEW COMPARISON:  Chest x-ray dated Jul 04, 2019. FINDINGS: New right pigtail chest tube with unchanged small loculated pleural effusion. The left lung is clear. No pneumothorax. The heart size and mediastinal contours are within normal limits. Normal pulmonary  vascularity. No acute osseous abnormality. IMPRESSION: 1. New right-sided chest tube with unchanged small loculated pleural effusion. No pneumothorax. Electronically Signed   By: Titus Dubin M.D.   On: 07/06/2019 09:17   CT IMAGE GUIDED DRAINAGE BY PERCUTANEOUS CATHETER  Result Date: 07/05/2019 INDICATION: Right empyema EXAM: CT-GUIDED PLACEMENT RIGHT 10 FRENCH CHEST TUBE MEDICATIONS: The patient is currently admitted to the hospital and receiving intravenous antibiotics. The antibiotics were administered within an appropriate time frame prior to the initiation of the procedure. ANESTHESIA/SEDATION: Fentanyl 50 mcg IV; Versed 1.0 mg IV Moderate Sedation Time:  11 MINUTES The patient was continuously monitored during the procedure by the interventional radiology nurse under my direct supervision. COMPLICATIONS: None immediate. PROCEDURE: Informed written consent was obtained from the patient after a thorough discussion of the procedural risks, benefits and alternatives. All questions were addressed. Maximal Sterile Barrier Technique was utilized including caps, mask, sterile gowns, sterile gloves, sterile drape, hand hygiene and skin antiseptic. A timeout was performed prior to the initiation of the procedure. Previous imaging reviewed. Patient positioned right anterior oblique. Noncontrast localization CT performed. The loculated right effusion was localized and marked for a lateral posterior approach. Under sterile conditions and local anesthesia, 18 gauge introducer needle was advanced from a posterolateral lower intercostal approach into the pleural fluid. Needle position confirmed with CT. Guidewire inserted followed by tract dilatation insert a 10 French drain. Drain catheter position confirmed with CT. Syringe aspiration yielded 10 cc of thick mucinous exudative fluid. Sample sent for pan culture. Catheter secured with Prolene suture and connected to external pleura vac. Sterile dressing applied. No  immediate complication. Patient tolerated the procedure well. IMPRESSION: Successful CT-guided 10 French right chest tube insertion. Electronically Signed   By: Jerilynn Mages.  Shick M.D.   On: 07/05/2019 15:44   US THORACENTESIS ASP PLEURAL SPACE W/IMG GUIDE  Result Date: 07/04/2019 CLINICAL DATA:  Loculated right pleural effusion. EXAM: ULTRASOUND GUIDED RIGHT THORACENTESIS COMPARISON:  CT of the chest on 06/29/2019 PROCEDURE: An ultrasound guided thoracentesis was thoroughly discussed with the patient and questions answered. The benefits, risks, alternatives and complications were also discussed. The patient understands and wishes to proceed with the procedure. Written consent was obtained. Ultrasound was performed to localize and mark an adequate pocket of fluid in the right chest. The area was then prepped and draped in the normal sterile fashion. 1% Lidocaine was used for local anesthesia. Under ultrasound guidance a 6 French Safe-T-Centesis catheter was introduced. Fluid was removed from the catheter. The catheter was then removed. Under ultrasound guidance, a 5 [place] Yueh centesis catheter was then introduced in a slightly higher pocket of pleural fluid in the right posterior chest. Aspiration was performed through the centesis catheter. The catheter was removed and a  dressing applied. FINDINGS: Ultrasound shows a very complex appearing and loculated right pleural effusion with relatively small components posteriorly. From the first pocket sampled in the right posterior pleural space towards the lung base, only 10 mL of clear appearing amber fluid was able to be aspirated. The sample was sent for various requested labs. Decision was made to sample a second slightly higher pocket yielding 5 mL of grossly purulent whitish green thick fluid. This sample was sent for culture analysis. IMPRESSION: Highly loculated and complex right pleural effusion with relatively small pockets posteriorly. The first pocket yielded 10  mL of clear appearing amber fluid. The second more superior pocket yielded 5 mL of grossly purulent fluid likely consistent with empyema. Fluid analysis will be performed on both samples. Electronically Signed   By: Aletta Edouard M.D.   On: 07/04/2019 14:24      ASSESSMENT/PLAN   Community acquired Pneumonia  -present on admission - due to gram positive cocci such as streptococcus or staph aureus - patient has had numerous courses of antibiotics on outpatient basis- higher risk for resistant bacteria -s/p thoracentesis with - GPC + -agree with empiric vanco/zosyn - appreciate pharmD-plan to narrow regimen off vancomycin -procalcitonin trending -MRSA nasal PCR  -bronchopulmonary hygiene with Acapella and IS x10/h   Empyema of right pleural space  - +GPCs - s/p 32F pigtail cathter -fluid studies resent today and recultured -discussed case with Dr Genevive Bi - likely will need tPA - appreciate input    Thank you for allowing me to participate in the care of this patient.  .   Patient/Family are satisfied with care plan and all questions have been answered.  This document was prepared using Dragon voice recognition software and may include unintentional dictation errors.     Ottie Glazier, M.D.  Division of [facility]

## 2019-07-07 ENCOUNTER — Inpatient Hospital Stay: Payer: PPO

## 2019-07-07 LAB — BASIC METABOLIC PANEL
Anion gap: 9 (ref 5–15)
BUN: 8 mg/dL (ref 8–23)
CO2: 25 mmol/L (ref 22–32)
Calcium: 8.5 mg/dL — ABNORMAL LOW (ref 8.9–10.3)
Chloride: 104 mmol/L (ref 98–111)
Creatinine, Ser: 0.65 mg/dL (ref 0.61–1.24)
GFR calc Af Amer: >60 mL/min (ref >60)
GFR calc non Af Amer: >60 mL/min (ref >60)
Glucose, Bld: 152 mg/dL — ABNORMAL HIGH (ref 70–99)
Potassium: 3.8 mmol/L (ref 3.5–5.1)
Sodium: 138 mmol/L (ref 135–145)

## 2019-07-07 LAB — CBC
HCT: 37 % — ABNORMAL LOW (ref 39.0–52.0)
Hemoglobin: 12.1 g/dL — ABNORMAL LOW (ref 13.0–17.0)
MCH: 28.3 pg (ref 26.0–34.0)
MCHC: 32.7 g/dL (ref 30.0–36.0)
MCV: 86.7 fL (ref 80.0–100.0)
Platelets: 261 10*3/uL (ref 150–400)
RBC: 4.27 MIL/uL (ref 4.22–5.81)
RDW: 13.5 % (ref 11.5–15.5)
WBC: 12.3 10*3/uL — ABNORMAL HIGH (ref 4.0–10.5)
nRBC: 0 % (ref 0.0–0.2)

## 2019-07-07 LAB — HIV ANTIBODY (ROUTINE TESTING W REFLEX): HIV Screen 4th Generation wRfx: NONREACTIVE

## 2019-07-07 LAB — ACID FAST SMEAR (AFB, MYCOBACTERIA): Acid Fast Smear: NEGATIVE

## 2019-07-07 LAB — PROCALCITONIN: Procalcitonin: <0.1 ng/mL

## 2019-07-07 NOTE — Progress Notes (Signed)
Pulmonary Medicine          Date: 07/07/2019,   MRN# [id number] Andrew Myers [birth year]     AdmissionWeight: 103.4 kg                 CurrentWeight: 99.7 kg   Refering physician:Dr Agbata   CHIEF COMPLAINT:   Pneumonia with Empyema   SUBJECTIVE   -Patient is laying in bed on room air with spO2 94%.   -We reviewed CT chest together, still considerable amount of right empyema compared to previous.   -He has red blistered skin area around adhesive tape for chest tube dressing, it is not painful or tender.   -  PAST MEDICAL HISTORY   Past Medical History:  Diagnosis Date  . BPH (benign prostatic hyperplasia)   . CAP (community acquired pneumonia) 04/2019  . Empyema lung ([place]) 06/2019  . Hyperlipidemia   . Hypertension   . Obesity   . Pleural effusion on right 05/2019  . Skin cancer    BCC, SCC  . Sleep apnea   . Sleep apnea      SURGICAL HISTORY   Past Surgical History:  Procedure Laterality Date  . APPENDECTOMY    . CATARACT EXTRACTION    . FRACTURE SURGERY       FAMILY HISTORY   Family History  Problem Relation Age of Onset  . Breast cancer Mother   . Hypertension Mother   . Hyperlipidemia Mother   . Heart failure Mother   . Heart attack Father   . Thyroid cancer Sister      SOCIAL HISTORY   Social History   Tobacco Use  . Smoking status: Former Smoker    Quit date: 05/07/1967    Years since quitting: 52.2  . Smokeless tobacco: Never Used  Substance Use Topics  . Alcohol use: Yes    Comment: rarely  . Drug use: No     MEDICATIONS    Home Medication:    Current Medication:  Current Facility-Administered Medications:  .  0.9 %  sodium chloride infusion, , Intravenous, Continuous, Agbata, Tochukwu, MD, Last Rate: 100 mL/hr at 07/07/19 0559, New Bag at 07/07/19 0559 .  acetaminophen (TYLENOL) tablet 650 mg, 650 mg, Oral, Q6H PRN **OR** acetaminophen (TYLENOL) suppository 650 mg, 650 mg, Rectal, Q6H PRN,  Agbata, Tochukwu, MD .  amLODipine (NORVASC) tablet 5 mg, 5 mg, Oral, Daily, Agbata, Tochukwu, MD, 5 mg at 07/07/19 1021 .  aspirin EC tablet 162 mg, 162 mg, Oral, Daily, Lu Duffel, RPH, 162 mg at 07/07/19 1021 .  atorvastatin (LIPITOR) tablet 20 mg, 20 mg, Oral, Daily, Agbata, Tochukwu, MD, 20 mg at 07/06/19 2001 .  hydrochlorothiazide (HYDRODIURIL) tablet 25 mg, 25 mg, Oral, Daily, Agbata, Tochukwu, MD, 25 mg at 07/07/19 1021 .  irbesartan (AVAPRO) tablet 37.5 mg, 37.5 mg, Oral, Daily, Agbata, Tochukwu, MD, 37.5 mg at 07/07/19 1021 .  loratadine (CLARITIN) tablet 10 mg, 10 mg, Oral, Daily, Agbata, Tochukwu, MD, 10 mg at 07/07/19 1021 .  nebivolol (BYSTOLIC) tablet 5 mg, 5 mg, Oral, Daily, Agbata, Tochukwu, MD, 5 mg at 07/07/19 1021 .  ondansetron (ZOFRAN) tablet 4 mg, 4 mg, Oral, Q6H PRN **OR** ondansetron (ZOFRAN) injection 4 mg, 4 mg, Intravenous, Q6H PRN, Agbata, Tochukwu, MD .  piperacillin-tazobactam (ZOSYN) IVPB 3.375 g, 3.375 g, Intravenous, Q8H, Nazari, Walid A, RPH, Last Rate: 12.5 mL/hr at 07/07/19 1327, 3.375 g at 07/07/19 1327 .  sodium chloride flush (NS) 0.9 % injection 3  mL, 3 mL, Intravenous, Once, Duffy Bruce, MD .  tamsulosin [facility]) capsule 0.8 mg, 0.8 mg, Oral, QPC supper, Agbata, Tochukwu, MD, 0.8 mg at 07/06/19 2002    ALLERGIES   Codeine     REVIEW OF SYSTEMS    Review of Systems:  Gen:  Denies  fever, sweats, chills weigh loss  HEENT: Denies blurred vision, double vision, ear pain, eye pain, hearing loss, nose bleeds, sore throat Cardiac:  No dizziness, chest pain or heaviness, chest tightness,edema Resp:   Denies cough or sputum porduction, shortness of breath,wheezing, hemoptysis,  Gi: Denies swallowing difficulty, stomach pain, nausea or vomiting, diarrhea, constipation, bowel incontinence Gu:  Denies bladder incontinence, burning urine Ext:   Denies Joint pain, stiffness or swelling Skin: Denies  skin rash, easy bruising or bleeding or  hives Endoc:  Denies polyuria, polydipsia , polyphagia or weight change Psych:   Denies depression, insomnia or hallucinations   Other:  All other systems negative   VS: BP (!) 150/67 (BP Location: Right Arm)   Pulse 81   Temp 98.4 F (36.9 C) (Oral)   Resp 19   Ht 5\' 11"  (1.803 m)   Wt 99.7 kg   SpO2 97%   BMI 30.64 kg/m      PHYSICAL EXAM    GENERAL:NAD, no fevers, chills, no weakness no fatigue HEAD: Normocephalic, atraumatic.  EYES: Pupils equal, round, reactive to light. Extraocular muscles intact. No scleral icterus.  MOUTH: Moist mucosal membrane. Dentition intact. No abscess noted.  EAR, NOSE, THROAT: Clear without exudates. No external lesions.  NECK: Supple. No thyromegaly. No nodules. No JVD.  PULMONARY: Clear to ausculatation bilaterally with Decreased air entry on right CARDIOVASCULAR: S1 and S2. Regular rate and rhythm. No murmurs, rubs, or gallops. No edema. Pedal pulses 2+ bilaterally.  GASTROINTESTINAL: Soft, nontender, nondistended. No masses. Positive bowel sounds. No hepatosplenomegaly.  MUSCULOSKELETAL: No swelling, clubbing, or edema. Range of motion full in all extremities.  NEUROLOGIC: Cranial nerves II through XII are intact. No gross focal neurological deficits. Sensation intact. Reflexes intact.  SKIN: No ulceration, lesions, rashes, or cyanosis. Skin warm and dry. Turgor intact.  PSYCHIATRIC: Mood, affect within normal limits. The patient is awake, alert and oriented x 3. Insight, judgment intact.       IMAGING    DG Chest 1 View  Result Date: 07/04/2019 CLINICAL DATA:  Status post right thoracentesis. EXAM: CHEST  1 VIEW COMPARISON:  CT of the chest on 06/29/2019 FINDINGS: The heart size and mediastinal contours are within normal limits. Residual lateral/posterolateral component of loculated pleural fluid remains on the right. No pneumothorax after right thoracentesis. No pulmonary edema. The visualized skeletal structures are unremarkable.  IMPRESSION: No pneumothorax after right sided thoracentesis. Residual lateral/posterolateral component of loculated right pleural fluid remains. Electronically Signed   By: Aletta Edouard M.D.   On: 07/04/2019 14:26   CT CHEST WO CONTRAST  Result Date: 06/29/2019 CLINICAL DATA:  Pleural effusion, shortness of breath. Follow-up chest x-ray EXAM: CT CHEST WITHOUT CONTRAST TECHNIQUE: Multidetector CT imaging of the chest was performed following the standard protocol without IV contrast. COMPARISON:  Chest x-ray 05/11/2019 FINDINGS: Cardiovascular: Heart is normal size. Aorta normal caliber. Scattered coronary artery and aortic calcifications. Mediastinum/Nodes: No mediastinal, hilar, or axillary adenopathy. Trachea and esophagus are unremarkable. Thyroid unremarkable. Lungs/Pleura: Partially loculated moderate-sized right pleural effusion noted. Compressive atelectasis or pneumonia in the right lower lobe. Scattered granulomas in the right lung. Left lung clear. Upper Abdomen: Imaging into the upper abdomen shows no  acute findings. Musculoskeletal: Chest wall soft tissues are unremarkable. No acute bony abnormality. IMPRESSION: Moderate partially loculated right pleural effusion. Right lower lobe compressive atelectasis versus pneumonia. Electronically Signed   By: Rolm Baptise M.D.   On: 06/29/2019 14:56   DG Chest Port 1 View  Result Date: 07/07/2019 CLINICAL DATA:  Follow-up right chest tube. EXAM: PORTABLE CHEST 1 VIEW COMPARISON:  07/06/2018 FINDINGS: Chest tube remains in place at the right lung base. There is slightly less pleural fluid than was seen yesterday. No pleural air is visible. Some pleural fluid does persist, with right lower lung atelectasis. Left hemithorax appears clear. IMPRESSION: Persistent right chest tube. Slightly less right pleural fluid and less pleural air. Moderate amount of pleural fluid does persist with lower right lung atelectasis. Electronically Signed   By: Nelson Chimes M.D.    On: 07/07/2019 11:55   DG Chest Port 1 View  Result Date: 07/06/2019 CLINICAL DATA:  Right empyema. EXAM: PORTABLE CHEST 1 VIEW COMPARISON:  Chest x-ray dated Jul 04, 2019. FINDINGS: New right pigtail chest tube with unchanged small loculated pleural effusion. The left lung is clear. No pneumothorax. The heart size and mediastinal contours are within normal limits. Normal pulmonary vascularity. No acute osseous abnormality. IMPRESSION: 1. New right-sided chest tube with unchanged small loculated pleural effusion. No pneumothorax. Electronically Signed   By: Titus Dubin M.D.   On: 07/06/2019 09:17   CT IMAGE GUIDED DRAINAGE BY PERCUTANEOUS CATHETER  Result Date: 07/05/2019 INDICATION: Right empyema EXAM: CT-GUIDED PLACEMENT RIGHT 10 FRENCH CHEST TUBE MEDICATIONS: The patient is currently admitted to the hospital and receiving intravenous antibiotics. The antibiotics were administered within an appropriate time frame prior to the initiation of the procedure. ANESTHESIA/SEDATION: Fentanyl 50 mcg IV; Versed 1.0 mg IV Moderate Sedation Time:  11 MINUTES The patient was continuously monitored during the procedure by the interventional radiology nurse under my direct supervision. COMPLICATIONS: None immediate. PROCEDURE: Informed written consent was obtained from the patient after a thorough discussion of the procedural risks, benefits and alternatives. All questions were addressed. Maximal Sterile Barrier Technique was utilized including caps, mask, sterile gowns, sterile gloves, sterile drape, hand hygiene and skin antiseptic. A timeout was performed prior to the initiation of the procedure. Previous imaging reviewed. Patient positioned right anterior oblique. Noncontrast localization CT performed. The loculated right effusion was localized and marked for a lateral posterior approach. Under sterile conditions and local anesthesia, 18 gauge introducer needle was advanced from a posterolateral lower  intercostal approach into the pleural fluid. Needle position confirmed with CT. Guidewire inserted followed by tract dilatation insert a 10 French drain. Drain catheter position confirmed with CT. Syringe aspiration yielded 10 cc of thick mucinous exudative fluid. Sample sent for pan culture. Catheter secured with Prolene suture and connected to external pleura vac. Sterile dressing applied. No immediate complication. Patient tolerated the procedure well. IMPRESSION: Successful CT-guided 10 French right chest tube insertion. Electronically Signed   By: Jerilynn Mages.  Shick M.D.   On: 07/05/2019 15:44   US THORACENTESIS ASP PLEURAL SPACE W/IMG GUIDE  Result Date: 07/04/2019 CLINICAL DATA:  Loculated right pleural effusion. EXAM: ULTRASOUND GUIDED RIGHT THORACENTESIS COMPARISON:  CT of the chest on 06/29/2019 PROCEDURE: An ultrasound guided thoracentesis was thoroughly discussed with the patient and questions answered. The benefits, risks, alternatives and complications were also discussed. The patient understands and wishes to proceed with the procedure. Written consent was obtained. Ultrasound was performed to localize and mark an adequate pocket of fluid in the right chest.  The area was then prepped and draped in the normal sterile fashion. 1% Lidocaine was used for local anesthesia. Under ultrasound guidance a 6 French Safe-T-Centesis catheter was introduced. Fluid was removed from the catheter. The catheter was then removed. Under ultrasound guidance, a 5 [place] Yueh centesis catheter was then introduced in a slightly higher pocket of pleural fluid in the right posterior chest. Aspiration was performed through the centesis catheter. The catheter was removed and a dressing applied. FINDINGS: Ultrasound shows a very complex appearing and loculated right pleural effusion with relatively small components posteriorly. From the first pocket sampled in the right posterior pleural space towards the lung base, only 10 mL of  clear appearing amber fluid was able to be aspirated. The sample was sent for various requested labs. Decision was made to sample a second slightly higher pocket yielding 5 mL of grossly purulent whitish green thick fluid. This sample was sent for culture analysis. IMPRESSION: Highly loculated and complex right pleural effusion with relatively small pockets posteriorly. The first pocket yielded 10 mL of clear appearing amber fluid. The second more superior pocket yielded 5 mL of grossly purulent fluid likely consistent with empyema. Fluid analysis will be performed on both samples. Electronically Signed   By: Aletta Edouard M.D.   On: 07/04/2019 14:24      ASSESSMENT/PLAN   Community acquired Pneumonia  -present on admission - due to gram positive cocci such as streptococcus or staph aureus - patient has had numerous courses of antibiotics on outpatient basis- higher risk for resistant bacteria -s/p thoracentesis with - GPC + - appreciate Infectious disease and pharmD-vanco dcd -procalcitonin trending -MRSA nasal PCR  -bronchopulmonary hygiene with Acapella and IS x10/h   Empyema of right pleural space  - +GPCs - s/p 9F pigtail cathter -fluid studies resent today and recultured -CT surgery on case Dr Genevive Bi - intrapleural tPA currently- appreciate input    Thank you for allowing me to participate in the care of this patient.  .   Patient/Family are satisfied with care plan and all questions have been answered.  This document was prepared using Dragon voice recognition software and may include unintentional dictation errors.     Ottie Glazier, M.D.  Division of [place]

## 2019-07-07 NOTE — Progress Notes (Signed)
PROGRESS NOTE    Andrew Myers  [name] DOB: December 27, 1945 DOA: 07/05/2019 PCP: Derinda Late, MD    Brief Narrative:  Andrew Myers is a 74 y.o. male with medical history significant of recent pna, htn, hyperglycemia, obesity, sleep apnea sent to ED by pulmonology for treatment of empyema.  Patient reported in March of this year he was treated for LLL infiltrate.  He completed 3 rounds antibiotics and steroids. By 4/29 he remained symptomatic.  Associated symptoms include intermittent fevers with max temp 99.9, intermittent right chest stabbing pain worse with deep breath some mild shortness of breath and general malaise.  He denies headache dizziness syncope or near syncope.  He denies chest pain palpitations lower extremity edema.  He denies abdominal pain nausea vomiting diarrhea constipation melena bright red blood per rectum.  He denies any dysuria hematuria frequency or urgency.  CT chest revealed pleural effusion. Underwent thoracentesis 07/04/19 yielding 79ml fluid. Fluid studies revealed gram +bacteria concerning for empyema. Patient was called at home and instructed to come to ED for admission.    Consultants:   ID, pulmonary, CT surgery  Procedures: Chest tube  Antimicrobials:   Zosyn Vanco   Subjective: Reports feeling much better today. No complaints. No sob  Objective: Vitals:   07/07/19 0422 07/07/19 0737 07/07/19 1130 07/07/19 1512  BP:  (!) 137/50 (!) 145/54 (!) 150/67  Pulse:  80 76 81  Resp:  18 18 19   Temp: 97.9 F (36.6 C) 98.1 F (36.7 C) 98.3 F (36.8 C) 98.4 F (36.9 C)  TempSrc: Oral Oral Oral Oral  SpO2:  95% 98% 97%  Weight:      Height:        Intake/Output Summary (Last 24 hours) at 07/07/2019 1523 Last data filed at 07/07/2019 1346 Gross per 24 hour  Intake 691.8 ml  Output 2154 ml  Net -1462.2 ml   Filed Weights   07/05/19 1442 07/06/19 0225 07/07/19 0300  Weight: 98.9 kg 100 kg 99.7 kg    Examination:  General  exam: Appears calm and comfortable, pleasant Respiratory system: Clear to auscultation. Respiratory effort normal.no wheezing Right chest tube in place draining bloody fluid Cardiovascular system: S1 & S2 heard, RRR. No JVD, murmurs, rubs, gallops or clicks.  Gastrointestinal system: Abdomen is nondistended, soft and nontender. Normal bowel sounds heard. Central nervous system: Alert and oriented.  Grossly intact Extremities: No edema or cyanosis Skin: Warm dry Psychiatry: Judgement and insight appear normal. Mood & affect appropriate.     Data Reviewed: I have personally reviewed following labs and imaging studies  CBC: Recent Labs  Lab 07/05/19 1036 07/06/19 0502 07/07/19 0438  WBC 11.5* 10.3 12.3*  NEUTROABS 8.5*  --   --   HGB 13.4 12.2* 12.1*  HCT 39.5 36.0* 37.0*  MCV 84.4 83.7 86.7  PLT 308 257 0000000   Basic Metabolic Panel: Recent Labs  Lab 07/05/19 1036 07/06/19 0502 07/07/19 0438  NA 135 137 138  K 3.4* 3.5 3.8  CL 97* 103 104  CO2 26 26 25   GLUCOSE 150* 123* 152*  BUN 10 10 8   CREATININE 0.60* 0.59* 0.65  CALCIUM 9.1 8.5* 8.5*   GFR: Estimated Creatinine Clearance: 100.5 mL/min (by C-G formula based on SCr of 0.65 mg/dL). Liver Function Tests: Recent Labs  Lab 07/05/19 1036  AST 24  ALT 30  ALKPHOS 57  BILITOT 0.8  PROT 8.1  ALBUMIN 3.5   No results for input(s): LIPASE, AMYLASE in the last 168 hours.  No results for input(s): AMMONIA in the last 168 hours. Coagulation Profile: No results for input(s): INR, PROTIME in the last 168 hours. Cardiac Enzymes: No results for input(s): CKTOTAL, CKMB, CKMBINDEX, TROPONINI in the last 168 hours. BNP (last 3 results) No results for input(s): PROBNP in the last 8760 hours. HbA1C: No results for input(s): HGBA1C in the last 72 hours. CBG: No results for input(s): GLUCAP in the last 168 hours. Lipid Profile: No results for input(s): CHOL, HDL, LDLCALC, TRIG, CHOLHDL, LDLDIRECT in the last 72  hours. Thyroid Function Tests: No results for input(s): TSH, T4TOTAL, FREET4, T3FREE, THYROIDAB in the last 72 hours. Anemia Panel: No results for input(s): VITAMINB12, FOLATE, FERRITIN, TIBC, IRON, RETICCTPCT in the last 72 hours. Sepsis Labs: Recent Labs  Lab 07/05/19 1036 07/06/19 0502 07/07/19 0438  PROCALCITON <0.10 <0.10 <0.10  LATICACIDVEN 1.9  --   --     Recent Results (from the past 240 hour(s))  SARS Coronavirus 2 by RT PCR (hospital order, performed in [facility] hospital lab) Nasopharyngeal Nasopharyngeal Swab     Status: None   Collection Time: 07/04/19 10:41 AM   Specimen: Nasopharyngeal Swab  Result Value Ref Range Status   SARS Coronavirus 2 NEGATIVE NEGATIVE Final    Comment: (NOTE) SARS-CoV-2 target nucleic acids are NOT DETECTED. The SARS-CoV-2 RNA is generally detectable in upper and lower respiratory specimens during the acute phase of infection. The lowest concentration of SARS-CoV-2 viral copies this assay can detect is 250 copies / mL. A negative result does not preclude SARS-CoV-2 infection and should not be used as the sole basis for treatment or other patient management decisions.  A negative result may occur with improper specimen collection / handling, submission of specimen other than nasopharyngeal swab, presence of viral mutation(s) within the areas targeted by this assay, and inadequate number of viral copies (<250 copies / mL). A negative result must be combined with clinical observations, patient history, and epidemiological information. Fact Sheet for Patients:   StrictlyIdeas.no Fact Sheet for Healthcare Providers: BankingDealers.co.za This test is not yet approved or cleared  by the [place] FDA and has been authorized for detection and/or diagnosis of SARS-CoV-2 by FDA under an Emergency Use Authorization (EUA).  This EUA will remain in effect (meaning this test can be used) for the  duration of the COVID-19 declaration under Section 564(b)(1) of the Act, 21 U.S.C. section 360bbb-3(b)(1), unless the authorization is terminated or revoked sooner. Performed at [facility], [address] [place], [place] [address]   Body fluid culture     Status: None (Preliminary result)   Collection Time: 07/04/19  1:00 PM   Specimen: PATH Cytology Pleural fluid  Result Value Ref Range Status   Specimen Description   Final    PLEURAL Performed at [facility], [address], [place], [place] [address]    Special Requests   Final    NONE Performed at [facility], [address] [place],  [address]    Gram Stain   Final    FEW WBC PRESENT,BOTH PMN AND MONONUCLEAR FEW GRAM POSITIVE COCCI    Culture   Final    NO GROWTH 3 DAYS Performed at [facility], [place] [address] [place],  [address]    Report Status PENDING  Incomplete  Acid Fast Smear (AFB)     Status: None   Collection Time: 07/04/19  1:00 PM   Specimen: PATH Cytology Pleural fluid  Result Value Ref Range  Status   AFB Specimen Processing Concentration  Final   Acid Fast Smear Negative  Final    Comment: (NOTE) Performed At: [facility] [place], [place] [id number] Rush Farmer MD [id number]    Source (AFB) PLEURAL  Final    Comment: Performed at [facility], [address] [place], [place] [address]  Blood culture (routine x 2)     Status: None (Preliminary result)   Collection Time: 07/05/19  1:14 PM   Specimen: BLOOD  Result Value Ref Range Status   Specimen Description BLOOD LEFT ANTECUBITAL  Final   Special Requests   Final    BOTTLES DRAWN AEROBIC AND ANAEROBIC Blood Culture results may not be optimal due to an excessive volume of blood received in culture bottles   Culture   Final    NO GROWTH 2 DAYS Performed at [facility], [address], [place], [place] [address]    Report Status PENDING   Incomplete  Blood culture (routine x 2)     Status: None (Preliminary result)   Collection Time: 07/05/19  1:14 PM   Specimen: BLOOD  Result Value Ref Range Status   Specimen Description BLOOD RIGHT ANTECUBITAL  Final   Special Requests   Final    BOTTLES DRAWN AEROBIC AND ANAEROBIC Blood Culture results may not be optimal due to an inadequate volume of blood received in culture bottles   Culture   Final    NO GROWTH 2 DAYS Performed at [facility], [address], [place], [place] [address]    Report Status PENDING  Incomplete  Aerobic/Anaerobic Culture (surgical/deep wound)     Status: None (Preliminary result)   Collection Time: 07/05/19  3:45 PM   Specimen: Fluid; Abscess  Result Value Ref Range Status   Specimen Description FLUID RIGHT PLEURAL  Final   Special Requests NONE  Final   Gram Stain   Final    RARE WBC PRESENT,BOTH PMN AND MONONUCLEAR NO ORGANISMS SEEN    Culture   Final    NO GROWTH 2 DAYS Performed at [facility], [address] [place], [place] [address]    Report Status PENDING  Incomplete  MRSA PCR Screening     Status: None   Collection Time: 07/05/19 11:05 PM  Result Value Ref Range Status   MRSA by PCR NEGATIVE NEGATIVE Final    Comment:        The GeneXpert MRSA Assay (FDA approved for NASAL specimens only), is one component of a comprehensive MRSA colonization surveillance program. It is not intended to diagnose MRSA infection nor to guide or monitor treatment for MRSA infections. Performed at [facility], [address], [place], [place] [address]          Radiology Studies: [facility] Chest [place] 1 View  Result Date: 07/07/2019 CLINICAL DATA:  Follow-up right chest tube. EXAM: PORTABLE CHEST 1 VIEW COMPARISON:  07/06/2018 FINDINGS: Chest tube remains in place at the right lung base. There is slightly less pleural fluid than was seen yesterday. No pleural air is visible. Some pleural fluid does persist, with right  lower lung atelectasis. Left hemithorax appears clear. IMPRESSION: Persistent right chest tube. Slightly less right pleural fluid and less pleural air. Moderate amount of pleural fluid does persist with lower right lung atelectasis. Electronically Signed   By: Nelson Chimes M.D.   On: 07/07/2019 11:55   DG Chest Port 1 View  Result Date: 07/06/2019 CLINICAL DATA:  Right  empyema. EXAM: PORTABLE CHEST 1 VIEW COMPARISON:  Chest x-ray dated Jul 04, 2019. FINDINGS: New right pigtail chest tube with unchanged small loculated pleural effusion. The left lung is clear. No pneumothorax. The heart size and mediastinal contours are within normal limits. Normal pulmonary vascularity. No acute osseous abnormality. IMPRESSION: 1. New right-sided chest tube with unchanged small loculated pleural effusion. No pneumothorax. Electronically Signed   By: Titus Dubin M.D.   On: 07/06/2019 09:17   CT IMAGE GUIDED DRAINAGE BY PERCUTANEOUS CATHETER  Result Date: 07/05/2019 INDICATION: Right empyema EXAM: CT-GUIDED PLACEMENT RIGHT 10 FRENCH CHEST TUBE MEDICATIONS: The patient is currently admitted to the hospital and receiving intravenous antibiotics. The antibiotics were administered within an appropriate time frame prior to the initiation of the procedure. ANESTHESIA/SEDATION: Fentanyl 50 mcg IV; Versed 1.0 mg IV Moderate Sedation Time:  11 MINUTES The patient was continuously monitored during the procedure by the interventional radiology nurse under my direct supervision. COMPLICATIONS: None immediate. PROCEDURE: Informed written consent was obtained from the patient after a thorough discussion of the procedural risks, benefits and alternatives. All questions were addressed. Maximal Sterile Barrier Technique was utilized including caps, mask, sterile gowns, sterile gloves, sterile drape, hand hygiene and skin antiseptic. A timeout was performed prior to the initiation of the procedure. Previous imaging reviewed. Patient  positioned right anterior oblique. Noncontrast localization CT performed. The loculated right effusion was localized and marked for a lateral posterior approach. Under sterile conditions and local anesthesia, 18 gauge introducer needle was advanced from a posterolateral lower intercostal approach into the pleural fluid. Needle position confirmed with CT. Guidewire inserted followed by tract dilatation insert a 10 French drain. Drain catheter position confirmed with CT. Syringe aspiration yielded 10 cc of thick mucinous exudative fluid. Sample sent for pan culture. Catheter secured with Prolene suture and connected to external pleura vac. Sterile dressing applied. No immediate complication. Patient tolerated the procedure well. IMPRESSION: Successful CT-guided 10 French right chest tube insertion. Electronically Signed   By: Jerilynn Mages.  Shick M.D.   On: 07/05/2019 15:44        Scheduled Meds: . amLODipine  5 mg Oral Daily  . aspirin EC  162 mg Oral Daily  . atorvastatin  20 mg Oral Daily  . hydrochlorothiazide  25 mg Oral Daily  . irbesartan  37.5 mg Oral Daily  . loratadine  10 mg Oral Daily  . nebivolol  5 mg Oral Daily  . sodium chloride flush  3 mL Intravenous Once  . tamsulosin  0.8 mg Oral QPC supper   Continuous Infusions: . sodium chloride 100 mL/hr at 07/07/19 0559  . piperacillin-tazobactam (ZOSYN)  IV 3.375 g (07/07/19 1327)    Assessment & Plan:   Principal Problem:   Empyema ([place]) Active Problems:   Hypertension   Hyperlipidemia   BPH (benign prostatic hyperplasia)   Hypokalemia   Obesity   Sleep apnea   Hyperglycemia   Empyema of right pleural space ([place])   #1.Rt  Empyema.  Patient underwent a thoracentesis May 11 and fluid studies positive for gram-positive bacteria.  He was instructed to come to the emergency department for admission/treatment. -Continue IV antibiotics. Will place patient on Vancomycin and Zosyn per pharmacy dosing -Follow blood cultures -Gentle IV  fluids -Supportive therapy -Chest tube placed, s/p intrapleural thrombolytics (TPA) on 5/13 CTS plan to order chest T to address pleural space. If lots of fluid, will consider TPA again.If minimal, then water suction. ID input was appreciated, d/c vanco since MRSA negative.  cotninue zosyn for now    #2.  Hypokalemia.  Mild.  Likely related to diuretic  Potassium level 3.4. Resolved we will continue to monitor  #3.  Hypertension.  Stable in the emergency department.  Home medications include amlodipine, hydrochlorothiazide, Bystolic, valsartan. -Resumed home meds -Monitor  #4.  Sleep apnea.  Patient reports he uses a home BiPAP. -Respiratory consult for device  #5.  BPH.  Stable at baseline. -Continue home meds  #6.  Obesity.  BMI 32. -nutritional consult  #7.  Hyperlipidemia.  Medications include a statin -New home meds  #8.  Hyperglycemia. No hx of same. Likely related to recent  steroid use.  -   #9. Pulmonary Hypertension Pt reports not having this . Takes Sildenafil for ED , not tid. Will change   DVT prophylaxis: SCD Code Status: Full Family Communication: None at bedside Disposition Plan: Back to previous home life Barrier: Has chest tube in place needs further surgical and medical management, currently not medically stable for discharge, also requires iv abx       LOS: 2 days   Time spent: 45 minutes with more than 50% COC    Nolberto Hanlon, MD [facility] Pager [phone]  If 7PM-7AM, please contact night-coverage [email or web address] Password [facility] 07/07/2019, 3:23 PM Patient ID: Royse Sewer, male   DOB: Jan 11, 1946, 74 y.o.   MRN: [id number]

## 2019-07-07 NOTE — Progress Notes (Addendum)
During handoff - output from chest tube documented and marked.   Denies pain at site, dressing and tubing intact.  No crackling noted under skin   Blister noted around outer dressing.

## 2019-07-07 NOTE — Progress Notes (Signed)
[name] Follow Up Note  Patient ID: Andrew Myers, male   DOB: [birth year], 74 y.o.   MRN: [id number]  HISTORY: No new problems.  Feels well.  Not short of breath.    Vitals:   07/07/19 0737 07/07/19 1130  BP: (!) 137/50 (!) 145/54  Pulse: 80 76  Resp: 18 18  Temp: 98.1 F (36.7 C) 98.3 F (36.8 C)  SpO2: 95% 98%     EXAM:  Resp: Lungs are clear bilaterally.  No respiratory distress, normal effort. Heart:  Regular without murmurs Abd:  Abdomen is soft, non distended and non tender. No masses are palpable.  There is no rebound and no guarding.  Neurological: Alert and oriented to person, place, and time. Coordination normal.  Skin: Skin is warm and dry. No rash noted. No diaphoretic. No erythema. No pallor.  Psychiatric: Normal mood and affect. Normal behavior. Judgment and thought content normal.   There is no air leak There was only 100 cc of fluid (serosanguinous) since TPA administration  Independent review of CXRay still shows volume loss and fluid in right hemithorax   ASSESSMENT: Empyema right chest    PLAN:   Will order a chest CT scan to address the pleural space.  If there is a significant amount of fluid, may consider TPA via chest tube again.  If there is minimal fluid, then we will continue with the chest tube to 20 cm of water suction.  Discussed with Dr. Windell Moment who will cover this weekend    Andrew Myers, MDPatient ID: Andrew Myers, male   DOB: 1945-03-19, 74 y.o.   MRN: [id number]

## 2019-07-07 NOTE — Progress Notes (Signed)
ID Pt has no specific complaints No fever No pain ambulating  Patient Vitals for the past 24 hrs:  BP Temp Temp src Pulse Resp SpO2 Weight  07/07/19 1130 (!) 145/54 98.3 F (36.8 C) Oral 76 18 98 % --  07/07/19 0737 (!) 137/50 98.1 F (36.7 C) Oral 80 18 95 % --  07/07/19 0422 -- 97.9 F (36.6 C) Oral -- -- -- --  07/07/19 0300 -- -- -- -- -- -- 99.7 kg  07/06/19 2327 -- -- -- -- (!) 24 -- --  07/06/19 2300 -- -- -- -- -- 91 % --  07/06/19 2200 -- -- -- -- -- 95 % --  07/06/19 2100 -- -- -- -- -- 95 % --  07/06/19 2000 -- -- -- 92 -- 92 % --  07/06/19 1930 -- 99.5 F (37.5 C) Oral -- -- -- --  07/06/19 1500 (!) 151/59 98.3 F (36.8 C) -- 70 (!) 25 91 % --  07/06/19 1300 -- -- -- 79 (!) 23 95 % --   O/e awake and alert Chest b/l air entry decreased rt base Catheter rt side of the back [id number] Abd soft  labs  CBC Latest Ref Rng & Units 07/07/2019 07/06/2019 07/05/2019  WBC 4.0 - 10.5 K/uL 12.3(H) 10.3 11.5(H)  Hemoglobin 13.0 - 17.0 g/dL 12.1(L) 12.2(L) 13.4  Hematocrit 39.0 - 52.0 % 37.0(L) 36.0(L) 39.5  Platelets 150 - 400 K/uL 261 257 308    CMP Latest Ref Rng & Units 07/07/2019 07/06/2019 07/05/2019  Glucose 70 - 99 mg/dL 152(H) 123(H) 150(H)  BUN 8 - 23 mg/dL 8 10 10   Creatinine 0.61 - 1.24 mg/dL 0.65 0.59(L) 0.60(L)  Sodium 135 - 145 mmol/L 138 137 135  Potassium 3.5 - 5.1 mmol/L 3.8 3.5 3.4(L)  Chloride 98 - 111 mmol/L 104 103 97(L)  CO2 22 - 32 mmol/L 25 26 26   Calcium 8.9 - 10.3 mg/dL 8.5(L) 8.5(L) 9.1  Total Protein 6.5 - 8.1 g/dL - - 8.1  Total Bilirubin 0.3 - 1.2 mg/dL - - 0.8  Alkaline Phos 38 - 126 U/L - - 57  AST 15 - 41 U/L - - 24  ALT 0 - 44 U/L - - 30   Pleural fluid from 12/04/2019 WBC 7678 Neutrophil percentage 90 LT H 2000 578 Total protein 4.2 pH 7.2  HIV nonreactive.  Micro 07/04/2019 fluid culture so far no growth.  Gram stain gram-positive cocci seen  Imaging 06/29/2019 moderate partially loculated right pleural effusion with right  lower lobe compressive atelectasis versus pneumonia. 07/07/19 Slightly diminished RIGHT pleural fluid, dominant loculated area along the major fissure in the RIGHT chest is unchanged.  Impression/recommendation Right empyema loculated.  Status post pigtail and TPA instillation. Cultures negative so far but patient has been on antibiotics prior to it.  The source likely would be the oral cavity organisms. Patient is currently on Zosyn.  If culture remains negative may be able to switch to Unasyn. Repeat CT still shows loculated fluid in the major fissure area- would benefit from VATS/Decortication  Hypertension on amlodipine and hydrochlorothiazide and irbesartan  Hyperlipidemia on atorvastatin. Discussed with patient and care team

## 2019-07-07 NOTE — Consult Note (Signed)
Pharmacy Antibiotic Note  Andrew Myers is a 74 y.o. male admitted on 07/05/2019 with empyema.  Pharmacy has been consulted for Zosyn dosing. ID following.  Plan:  Zosyn 3.375g IV q8h (4 hour infusion).  Height: 5\' 11"  (180.3 cm) Weight: 99.7 kg (219 lb 11.2 oz) IBW/kg (Calculated) : 75.3  Temp (24hrs), Avg:98.5 F (36.9 C), Min:97.9 F (36.6 C), Max:99.5 F (37.5 C)  Recent Labs  Lab 07/05/19 1036 07/06/19 0502 07/07/19 0438  WBC 11.5* 10.3 12.3*  CREATININE 0.60* 0.59* 0.65  LATICACIDVEN 1.9  --   --     Estimated Creatinine Clearance: 100.5 mL/min (by C-G formula based on SCr of 0.65 mg/dL).    Allergies  Allergen Reactions  . Codeine Rash    Antimicrobials this admission: Vancomycin 5/12 >> 5/13 Zosyn 5/12 >>   Microbiology results: 5/12 BCx: pending 5/12 Fungus Cx: pending    Thank you for allowing pharmacy to be a part of this patient's care.  Oswald Hillock, PharmD, BCPS 07/07/2019 10:10 AM

## 2019-07-08 LAB — BODY FLUID CULTURE

## 2019-07-08 MED ORDER — SODIUM CHLORIDE 0.9 % IV SOLN
3.0000 g | Freq: Four times a day (QID) | INTRAVENOUS | Status: DC
  Administered 2019-07-08 – 2019-07-14 (×23): 3 g via INTRAVENOUS
  Filled 2019-07-08 (×6): qty 3
  Filled 2019-07-08: qty 8
  Filled 2019-07-08: qty 3
  Filled 2019-07-08 (×2): qty 8
  Filled 2019-07-08: qty 3
  Filled 2019-07-08: qty 8
  Filled 2019-07-08 (×3): qty 3
  Filled 2019-07-08 (×3): qty 8
  Filled 2019-07-08 (×2): qty 3
  Filled 2019-07-08: qty 8
  Filled 2019-07-08 (×4): qty 3
  Filled 2019-07-08: qty 8

## 2019-07-08 NOTE — Progress Notes (Signed)
[place] Hospital Day(s): 3.   Post op day(s):  Marland Kitchen   Interval History: Patient seen and examined, no acute events or new complaints overnight. Patient reports feeling well today.  He denies chest pain.  He denies shortness of breath.  He denies fever or chills.  There is no pain radiation.  There is no alleviating or aggravating factor.  Patient denies coughing.  Patient had a CT scan yesterday that shows no significant decrease in fluid.  I personally evaluated the images.  I consider that he has multiple loculated collections and the current drain drain one of the collections but he has other collections are not communicating.  And I think that another dose of TPA will help.  Vital signs in last 24 hours: [min-max] current  Temp:  [98.4 F (36.9 C)-99.4 F (37.4 C)] 98.9 F (37.2 C) (05/15 0428) Pulse Rate:  [78-87] 87 (05/15 0428) Resp:  [19-20] 20 (05/15 0428) BP: (137-151)/(59-70) 151/70 (05/15 0428) SpO2:  [92 %-97 %] 96 % (05/15 0428) Weight:  [98.2 kg] 98.2 kg (05/15 0428)     Height: 5\' 11"  (180.3 cm) Weight: 98.2 kg BMI (Calculated): 30.21   Physical Exam:  Constitutional: alert, cooperative and no distress  Respiratory: breathing non-labored at rest.  Right chest tube in place without air leak Cardiovascular: regular rate and sinus rhythm  Gastrointestinal: soft, non-tender, and non-distended  Labs:  CBC Latest Ref Rng & Units 07/07/2019 07/06/2019 07/05/2019  WBC 4.0 - 10.5 K/uL 12.3(H) 10.3 11.5(H)  Hemoglobin 13.0 - 17.0 g/dL 12.1(L) 12.2(L) 13.4  Hematocrit 39.0 - 52.0 % 37.0(L) 36.0(L) 39.5  Platelets 150 - 400 K/uL 261 257 308   CMP Latest Ref Rng & Units 07/07/2019 07/06/2019 07/05/2019  Glucose 70 - 99 mg/dL 152(H) 123(H) 150(H)  BUN 8 - 23 mg/dL 8 10 10   Creatinine 0.61 - 1.24 mg/dL 0.65 0.59(L) 0.60(L)  Sodium 135 - 145 mmol/L 138 137 135  Potassium 3.5 - 5.1 mmol/L 3.8 3.5 3.4(L)  Chloride 98 - 111 mmol/L 104 103 97(L)  CO2 22 - 32 mmol/L 25  26 26   Calcium 8.9 - 10.3 mg/dL 8.5(L) 8.5(L) 9.1  Total Protein 6.5 - 8.1 g/dL - - 8.1  Total Bilirubin 0.3 - 1.2 mg/dL - - 0.8  Alkaline Phos 38 - 126 U/L - - 57  AST 15 - 41 U/L - - 24  ALT 0 - 44 U/L - - 30    Imaging studies: CT of the chest with persistent fluid collection in the right chest.   Assessment/Plan:  74 y.o. male with right chest empyema s/p pigtail chest tube, complicated by pertinent comorbidities including pneumonia, hyperlipidemia, hypertension, obesity, sleep apnea.  Patient with persistent fluid collection in p.m. of the right chest.  We will continue with chest tube drainage.  We will continue with IV antibiotic therapy.  If this did not improve with current therapy we will discuss with cardiothoracic surgeon for possible repositioning of the pigtail by IR versus a second dose of TPA but I think that if this has multiple loculations of the TPA we will not communicate with the other collections.  At this moment continue with IV antibiotic therapy chest drainage.   Arnold Long, MD

## 2019-07-08 NOTE — Plan of Care (Signed)
NAD noted. Denies SOB, CP, N/V.  Chest tube to -20cm H2O suction; minimal drainage noted.  Per Dr. Lanney Gins, plan is to instill TPA on Monday and evaluate the need for another pigtail chest tube.   Problem: Education: Goal: Knowledge of General Education information will improve Description: Including pain rating scale, medication(s)/side effects and non-pharmacologic comfort measures Outcome: Progressing   Problem: Health Behavior/Discharge Planning: Goal: Ability to manage health-related needs will improve Outcome: Progressing   Problem: Clinical Measurements: Goal: Ability to maintain clinical measurements within normal limits will improve Outcome: Progressing Goal: Will remain free from infection Outcome: Progressing Goal: Diagnostic test results will improve Outcome: Progressing Goal: Respiratory complications will improve Outcome: Progressing Goal: Cardiovascular complication will be avoided Outcome: Progressing   Problem: Activity: Goal: Risk for activity intolerance will decrease Outcome: Progressing   Problem: Nutrition: Goal: Adequate nutrition will be maintained Outcome: Progressing   Problem: Coping: Goal: Level of anxiety will decrease Outcome: Progressing   Problem: Elimination: Goal: Will not experience complications related to bowel motility Outcome: Progressing Goal: Will not experience complications related to urinary retention Outcome: Progressing   Problem: Pain Managment: Goal: General experience of comfort will improve Outcome: Progressing   Problem: Safety: Goal: Ability to remain free from injury will improve Outcome: Progressing   Problem: Skin Integrity: Goal: Risk for impaired skin integrity will decrease Outcome: Progressing   Problem: Activity: Goal: Ability to tolerate increased activity will improve Outcome: Progressing   Problem: Clinical Measurements: Goal: Ability to maintain a body temperature in the normal range will  improve Outcome: Progressing   Problem: Respiratory: Goal: Ability to maintain adequate ventilation will improve Outcome: Progressing Goal: Ability to maintain a clear airway will improve Outcome: Progressing

## 2019-07-08 NOTE — Progress Notes (Signed)
PROGRESS NOTE    Andrew Myers  [name] DOB: May 05, 1945 DOA: 07/05/2019 PCP: Derinda Late, MD    Brief Narrative:  Andrew Myers is a 74 y.o. male with medical history significant of recent pna, htn, hyperglycemia, obesity, sleep apnea sent to ED by pulmonology for treatment of empyema.  Patient reported in March of this year he was treated for LLL infiltrate.  He completed 3 rounds antibiotics and steroids. By 4/29 he remained symptomatic.  Associated symptoms include intermittent fevers with max temp 99.9, intermittent right chest stabbing pain worse with deep breath some mild shortness of breath and general malaise.  He denies headache dizziness syncope or near syncope.  He denies chest pain palpitations lower extremity edema.  He denies abdominal pain nausea vomiting diarrhea constipation melena bright red blood per rectum.  He denies any dysuria hematuria frequency or urgency.  CT chest revealed pleural effusion. Underwent thoracentesis 07/04/19 yielding 68ml fluid. Fluid studies revealed gram +bacteria concerning for empyema. Patient was called at home and instructed to come to ED for admission.    Consultants:   ID, pulmonary, CT surgery  Procedures: Chest tube  Antimicrobials:   Zosyn Vanco   Subjective: Has no shortness of breath, chest pain, chills or fever  Objective: Vitals:   07/07/19 2300 07/08/19 0428 07/08/19 1033 07/08/19 1315  BP:  (!) 151/70 (!) 141/69 140/75  Pulse: 78 87 73 72  Resp:  20 (!) 21 20  Temp:  98.9 F (37.2 C)  97.8 F (36.6 C)  TempSrc:  Oral  Oral  SpO2: 92% 96% 93% 97%  Weight:  98.2 kg    Height:        Intake/Output Summary (Last 24 hours) at 07/08/2019 1331 Last data filed at 07/08/2019 1205 Gross per 24 hour  Intake 842.74 ml  Output 1575 ml  Net -732.26 ml   Filed Weights   07/06/19 0225 07/07/19 0300 07/08/19 0428  Weight: 100 kg 99.7 kg 98.2 kg    Examination:  General exam: Appears calm and  comfortable, NAD Respiratory system: Clear to auscultation. Respiratory effort normal.no wheezing or rhonchi Right chest tube in place draining bloody fluid, less than yesterday Cardiovascular system: S1 & S2 heard, RRR. No JVD, murmurs, rubs, gallops or clicks.  Gastrointestinal system: Abdomen is nondistended, soft and nontender. Normal bowel sounds heard. Central nervous system: Alert and oriented.  Grossly intact Extremities: No edema  Skin: Warm dry Psychiatry: Judgement and insight appear normal. Mood & affect appropriate.     Data Reviewed: I have personally reviewed following labs and imaging studies  CBC: Recent Labs  Lab 07/05/19 1036 07/06/19 0502 07/07/19 0438  WBC 11.5* 10.3 12.3*  NEUTROABS 8.5*  --   --   HGB 13.4 12.2* 12.1*  HCT 39.5 36.0* 37.0*  MCV 84.4 83.7 86.7  PLT 308 257 0000000   Basic Metabolic Panel: Recent Labs  Lab 07/05/19 1036 07/06/19 0502 07/07/19 0438  NA 135 137 138  K 3.4* 3.5 3.8  CL 97* 103 104  CO2 26 26 25   GLUCOSE 150* 123* 152*  BUN 10 10 8   CREATININE 0.60* 0.59* 0.65  CALCIUM 9.1 8.5* 8.5*   GFR: Estimated Creatinine Clearance: 99.8 mL/min (by C-G formula based on SCr of 0.65 mg/dL). Liver Function Tests: Recent Labs  Lab 07/05/19 1036  AST 24  ALT 30  ALKPHOS 57  BILITOT 0.8  PROT 8.1  ALBUMIN 3.5   No results for input(s): LIPASE, AMYLASE in the last 168 hours.  No results for input(s): AMMONIA in the last 168 hours. Coagulation Profile: No results for input(s): INR, PROTIME in the last 168 hours. Cardiac Enzymes: No results for input(s): CKTOTAL, CKMB, CKMBINDEX, TROPONINI in the last 168 hours. BNP (last 3 results) No results for input(s): PROBNP in the last 8760 hours. HbA1C: No results for input(s): HGBA1C in the last 72 hours. CBG: No results for input(s): GLUCAP in the last 168 hours. Lipid Profile: No results for input(s): CHOL, HDL, LDLCALC, TRIG, CHOLHDL, LDLDIRECT in the last 72 hours. Thyroid  Function Tests: No results for input(s): TSH, T4TOTAL, FREET4, T3FREE, THYROIDAB in the last 72 hours. Anemia Panel: No results for input(s): VITAMINB12, FOLATE, FERRITIN, TIBC, IRON, RETICCTPCT in the last 72 hours. Sepsis Labs: Recent Labs  Lab 07/05/19 1036 07/06/19 0502 07/07/19 0438  PROCALCITON <0.10 <0.10 <0.10  LATICACIDVEN 1.9  --   --     Recent Results (from the past 240 hour(s))  SARS Coronavirus 2 by RT PCR (hospital order, performed in [facility] hospital lab) Nasopharyngeal Nasopharyngeal Swab     Status: None   Collection Time: 07/04/19 10:41 AM   Specimen: Nasopharyngeal Swab  Result Value Ref Range Status   SARS Coronavirus 2 NEGATIVE NEGATIVE Final    Comment: (NOTE) SARS-CoV-2 target nucleic acids are NOT DETECTED. The SARS-CoV-2 RNA is generally detectable in upper and lower respiratory specimens during the acute phase of infection. The lowest concentration of SARS-CoV-2 viral copies this assay can detect is 250 copies / mL. A negative result does not preclude SARS-CoV-2 infection and should not be used as the sole basis for treatment or other patient management decisions.  A negative result may occur with improper specimen collection / handling, submission of specimen other than nasopharyngeal swab, presence of viral mutation(s) within the areas targeted by this assay, and inadequate number of viral copies (<250 copies / mL). A negative result must be combined with clinical observations, patient history, and epidemiological information. Fact Sheet for Patients:   StrictlyIdeas.no Fact Sheet for Healthcare Providers: BankingDealers.co.za This test is not yet approved or cleared  by the [place] FDA and has been authorized for detection and/or diagnosis of SARS-CoV-2 by FDA under an Emergency Use Authorization (EUA).  This EUA will remain in effect (meaning this test can be used) for the duration of  the COVID-19 declaration under Section 564(b)(1) of the Act, 21 U.S.C. section 360bbb-3(b)(1), unless the authorization is terminated or revoked sooner. Performed at [facility], [address] [place], [place] [address]   Body fluid culture     Status: None   Collection Time: 07/04/19  1:00 PM   Specimen: PATH Cytology Pleural fluid  Result Value Ref Range Status   Specimen Description   Final    PLEURAL Performed at [facility], [address] [place], [place] [address]    Special Requests   Final    NONE Performed at [facility], [address] [place], [place] [address]    Gram Stain   Final    FEW WBC PRESENT,BOTH PMN AND MONONUCLEAR FEW GRAM POSITIVE COCCI    Culture   Final    NO GROWTH 3 DAYS Performed at [facility], [address], [place], [place] [address]    Report Status 07/08/2019 FINAL  Final  Acid Fast Smear (AFB)     Status: None   Collection Time: 07/04/19  1:00 PM   Specimen: PATH Cytology Pleural fluid  Result Value Ref Range Status  AFB Specimen Processing Concentration  Final   Acid Fast Smear Negative  Final    Comment: (NOTE) Performed At: [facility] [place], [place] [id number] Rush Farmer MD [id number]    Source (AFB) PLEURAL  Final    Comment: Performed at [facility], [address] [place], [place] [address]  Blood culture (routine x 2)     Status: None (Preliminary result)   Collection Time: 07/05/19  1:14 PM   Specimen: BLOOD  Result Value Ref Range Status   Specimen Description BLOOD LEFT ANTECUBITAL  Final   Special Requests   Final    BOTTLES DRAWN AEROBIC AND ANAEROBIC Blood Culture results may not be optimal due to an excessive volume of blood received in culture bottles   Culture   Final    NO GROWTH 3 DAYS Performed at [facility], [address] [place], [place] [address]    Report Status PENDING  Incomplete  Blood culture  (routine x 2)     Status: None (Preliminary result)   Collection Time: 07/05/19  1:14 PM   Specimen: BLOOD  Result Value Ref Range Status   Specimen Description BLOOD RIGHT ANTECUBITAL  Final   Special Requests   Final    BOTTLES DRAWN AEROBIC AND ANAEROBIC Blood Culture results may not be optimal due to an inadequate volume of blood received in culture bottles   Culture   Final    NO GROWTH 3 DAYS Performed at [facility], [address] [place], [place] [address]    Report Status PENDING  Incomplete  Aerobic/Anaerobic Culture (surgical/deep wound)     Status: None (Preliminary result)   Collection Time: 07/05/19  3:45 PM   Specimen: Fluid; Abscess  Result Value Ref Range Status   Specimen Description FLUID RIGHT PLEURAL  Final   Special Requests NONE  Final   Gram Stain   Final    RARE WBC PRESENT,BOTH PMN AND MONONUCLEAR NO ORGANISMS SEEN    Culture   Final    NO GROWTH 3 DAYS NO ANAEROBES ISOLATED; CULTURE IN PROGRESS FOR 5 DAYS Performed at [facility], [place] [address], [place], [place] [address]    Report Status PENDING  Incomplete  Acid Fast Smear (AFB)     Status: None   Collection Time: 07/05/19  3:45 PM   Specimen: Pleural, Right  Result Value Ref Range Status   AFB Specimen Processing Concentration  Final   Acid Fast Smear Negative  Final    Comment: (NOTE) Performed At: [facility] [address] [place], [place] [id number] Rush Farmer MD [id number]    Source (AFB) LUNG  Final    Comment: RIGHT Performed at [facility], [address] [place], [place] [address]   MRSA PCR Screening     Status: None   Collection Time: 07/05/19 11:05 PM  Result Value Ref Range Status   MRSA by PCR NEGATIVE NEGATIVE Final    Comment:        The GeneXpert MRSA Assay (FDA approved for NASAL specimens only), is one component of a comprehensive MRSA colonization surveillance program. It is not intended to diagnose MRSA infection  nor to guide or monitor treatment for MRSA infections. Performed at [facility], [address], [place], [place] [address]          Radiology Studies: CT CHEST WO CONTRAST  Result Date: 07/07/2019 CLINICAL DATA:  Follow-up scan for empyema of the RIGHT lung, chest tube  placed 2 days ago EXAM: CT CHEST WITHOUT CONTRAST TECHNIQUE: Multidetector CT imaging of the chest was performed following the standard protocol without IV contrast. COMPARISON:  06/28/2009 and intervening interventional and ultrasound evaluations. FINDINGS: Cardiovascular: Calcified atheromatous plaque in the thoracic aorta similar to prior study. No aneurysmal dilation. Heart size is stable without pericardial effusion. Calcified coronary artery disease as before. Vessels not well assessed given lack of intravenous contrast. Mediastinum/Nodes: No thoracic inlet adenopathy. No axillary lymphadenopathy. No mediastinal adenopathy. Esophagus grossly normal. Lungs/Pleura: Since the previous examination there is been interval placement of a RIGHT-sided chest tube. A small amount of subcutaneous emphysema is noted. The marker for chest tube sideholes is at least 1.5 cm within the RIGHT chest cavity within the pleural space. Pleural fluid at the RIGHT lung base is perhaps slightly diminished but there is no change in the loculated fluid along the fissure in the RIGHT chest when compared to the prior exam RIGHT basilar consolidative changes and volume loss are similar to the prior study. LEFT chest is clear. Airways are patent. Upper Abdomen: Upper abdominal contents are unremarkable. No acute process in the upper abdomen. Musculoskeletal: Small amount of subcutaneous emphysema along the RIGHT chest wall as discussed. No chest wall mass. No acute bone process. Spinal degenerative change. IMPRESSION: 1. Slightly diminished RIGHT pleural fluid, dominant loculated area along the major fissure in the RIGHT chest is unchanged. 2.  Persistent basilar consolidation/volume loss without change. This may represent rounded atelectasis or pneumonia. Follow-up is suggested to ensure there is no underlying mass. 3. Small amount of subcutaneous emphysema along the chest wall. Marker for sideholes is within the pleural space as discussed above. 4. Atherosclerotic changes in the thoracic aorta. Calcified coronary artery disease. 5. Aortic atherosclerosis. Aortic Atherosclerosis (ICD10-I70.0). Electronically Signed   By: Zetta Bills M.D.   On: 07/07/2019 16:33   DG Chest Port 1 View  Result Date: 07/07/2019 CLINICAL DATA:  Follow-up right chest tube. EXAM: PORTABLE CHEST 1 VIEW COMPARISON:  07/06/2018 FINDINGS: Chest tube remains in place at the right lung base. There is slightly less pleural fluid than was seen yesterday. No pleural air is visible. Some pleural fluid does persist, with right lower lung atelectasis. Left hemithorax appears clear. IMPRESSION: Persistent right chest tube. Slightly less right pleural fluid and less pleural air. Moderate amount of pleural fluid does persist with lower right lung atelectasis. Electronically Signed   By: Nelson Chimes M.D.   On: 07/07/2019 11:55        Scheduled Meds: . amLODipine  5 mg Oral Daily  . aspirin EC  162 mg Oral Daily  . atorvastatin  20 mg Oral Daily  . hydrochlorothiazide  25 mg Oral Daily  . irbesartan  37.5 mg Oral Daily  . loratadine  10 mg Oral Daily  . nebivolol  5 mg Oral Daily  . tamsulosin  0.8 mg Oral QPC supper   Continuous Infusions: . sodium chloride 100 mL/hr (07/08/19 0508)  . ampicillin-sulbactam (UNASYN) IV      Assessment & Plan:   Principal Problem:   Empyema ([place]) Active Problems:   Hypertension   Hyperlipidemia   BPH (benign prostatic hyperplasia)   Hypokalemia   Obesity   Sleep apnea   Hyperglycemia   Empyema of right pleural space ([place])   #1.Rt  Empyema.  Patient underwent a thoracentesis May 11 and fluid studies positive for  gram-positive bacteria.  He was instructed to come to the emergency department for admission/treatment. -Continue IV antibiotics. Will  place patient on Vancomycin and Zosyn per pharmacy dosing -Follow blood cultures -Gentle IV fluids -Supportive therapy -Chest tube placed, s/p intrapleural thrombolytics (TPA) on 5/13 CTS ordered CT yesterday. Per surgery TPA v.s. pigtail by IR.. ID input was appreciated, d/c vanco since MRSA negative.  cotninue zosyn for now    #2.  Hypokalemia.  Mild.  Likely related to diuretic  improved with replacement  #3.  Hypertension.  Stable in the emergency department.  Home medications include amlodipine, hydrochlorothiazide, Bystolic, valsartan. -Resumed home meds -Monitor  #4.  Sleep apnea.  Patient reports he uses a home BiPAP. -Respiratory consult for device  #5.  BPH.  Stable at baseline. -Continue home meds  #6.  Obesity.  BMI 32. -nutritional consult  #7.  Hyperlipidemia.  Medications include a statin -New home meds  #8.  Hyperglycemia. No hx of same. Likely related to recent  steroid use.  -   #9. Pulmonary Hypertension Pt reports not having this . Takes Sildenafil for ED , not tid. Will change   DVT prophylaxis: SCD Code Status: Full Family Communication: None at bedside Disposition Plan: Back to previous home life Barrier: Has chest tube in place needs further surgical and medical management, currently not medically stable for discharge, also requires iv abx       LOS: 3 days   Time spent: 45 minutes with more than 50% COC    Nolberto Hanlon, MD [facility] Pager [phone]  If 7PM-7AM, please contact night-coverage [email or web address] Password [facility] 07/08/2019, 1:31 PM Patient ID: Andrew Myers, male   DOB: 1945/09/03, 74 y.o.   MRN: [id number]

## 2019-07-08 NOTE — Plan of Care (Signed)
  Problem: Clinical Measurements: Goal: Will remain free from infection Outcome: Progressing   Problem: Pain Managment: Goal: General experience of comfort will improve Outcome: Progressing   Problem: Education: Goal: Knowledge of General Education information will improve Description: Including pain rating scale, medication(s)/side effects and non-pharmacologic comfort measures Outcome: Progressing   Problem: Safety: Goal: Ability to remain free from injury will improve Outcome: Progressing

## 2019-07-08 NOTE — Plan of Care (Signed)
  Problem: Health Behavior/Discharge Planning: Goal: Ability to manage health-related needs will improve Outcome: Progressing   Problem: Clinical Measurements: Goal: Respiratory complications will improve Outcome: Progressing   Problem: Activity: Goal: Ability to tolerate increased activity will improve Outcome: Progressing   Problem: Clinical Measurements: Goal: Ability to maintain a body temperature in the normal range will improve Outcome: Progressing   Problem: Safety: Goal: Ability to remain free from injury will improve Outcome: Progressing

## 2019-07-08 NOTE — Progress Notes (Signed)
Pulmonary Medicine          Date: 07/08/2019,   MRN# [id number] Danney Hanney 1945/05/17     AdmissionWeight: 103.4 kg                 CurrentWeight: 98.2 kg   Refering physician:Dr Agbata   CHIEF COMPLAINT:   Pneumonia with Empyema   SUBJECTIVE   -Patient is laying in bed in no distress.   -He slept well, used his own CPAP.   -I reviewed imaging yesterday with Dr Pascal Lux and we discussed findings with Dr Genevive Bi. There is remaining effusion and consolidation with plan to wait until Monday to decide for any additional intervention.   -Patient is stable in no distress.  -  PAST MEDICAL HISTORY   Past Medical History:  Diagnosis Date   BPH (benign prostatic hyperplasia)    CAP (community acquired pneumonia) 04/2019   Empyema lung ([place]) 06/2019   Hyperlipidemia    Hypertension    Obesity    Pleural effusion on right 05/2019   Skin cancer    BCC, SCC   Sleep apnea    Sleep apnea      SURGICAL HISTORY   Past Surgical History:  Procedure Laterality Date   APPENDECTOMY     CATARACT EXTRACTION     FRACTURE SURGERY       FAMILY HISTORY   Family History  Problem Relation Age of Onset   Breast cancer Mother    Hypertension Mother    Hyperlipidemia Mother    Heart failure Mother    Heart attack Father    Thyroid cancer Sister      SOCIAL HISTORY   Social History   Tobacco Use   Smoking status: Former Smoker    Quit date: 05/07/1967    Years since quitting: 52.2   Smokeless tobacco: Never Used  Substance Use Topics   Alcohol use: Yes    Comment: rarely   Drug use: No     MEDICATIONS    Home Medication:    Current Medication:  Current Facility-Administered Medications:    0.9 %  sodium chloride infusion, , Intravenous, Continuous, Agbata, Tochukwu, MD, Last Rate: 100 mL/hr at 07/08/19 0508, 100 mL/hr at 07/08/19 [id number]   acetaminophen (TYLENOL) tablet 650 mg, 650 mg, Oral, Q6H PRN **OR**  acetaminophen (TYLENOL) suppository 650 mg, 650 mg, Rectal, Q6H PRN, Agbata, Tochukwu, MD   amLODipine (NORVASC) tablet 5 mg, 5 mg, Oral, Daily, Agbata, Tochukwu, MD, 5 mg at 07/08/19 1015   Ampicillin-Sulbactam (UNASYN) 3 g in sodium chloride 0.9 % 100 mL IVPB, 3 g, Intravenous, Q6H, Ravishankar, Jayashree, MD, Last Rate: 200 mL/hr at 07/08/19 1410, 3 g at 07/08/19 1410   aspirin EC tablet 162 mg, 162 mg, Oral, Daily, Lu Duffel, RPH, 162 mg at 07/08/19 1014   atorvastatin (LIPITOR) tablet 20 mg, 20 mg, Oral, Daily, Agbata, Tochukwu, MD, 20 mg at 07/07/19 1711   hydrochlorothiazide (HYDRODIURIL) tablet 25 mg, 25 mg, Oral, Daily, Agbata, Tochukwu, MD, 25 mg at 07/08/19 1015   irbesartan (AVAPRO) tablet 37.5 mg, 37.5 mg, Oral, Daily, Agbata, Tochukwu, MD, 37.5 mg at 07/08/19 1014   loratadine (CLARITIN) tablet 10 mg, 10 mg, Oral, Daily, Agbata, Tochukwu, MD, 10 mg at 07/08/19 1014   nebivolol (BYSTOLIC) tablet 5 mg, 5 mg, Oral, Daily, Agbata, Tochukwu, MD, 5 mg at 07/08/19 1015   ondansetron (ZOFRAN) tablet 4 mg, 4 mg, Oral, Q6H PRN **OR** ondansetron (ZOFRAN) injection 4 mg, 4 mg,  Intravenous, Q6H PRN, Agbata, Tochukwu, MD   tamsulosin (FLOMAX) capsule 0.8 mg, 0.8 mg, Oral, QPC supper, Agbata, Tochukwu, MD, 0.8 mg at 07/07/19 1712    ALLERGIES   Codeine     REVIEW OF SYSTEMS    Review of Systems:  Gen:  Denies  fever, sweats, chills weigh loss  HEENT: Denies blurred vision, double vision, ear pain, eye pain, hearing loss, nose bleeds, sore throat Cardiac:  No dizziness, chest pain or heaviness, chest tightness,edema Resp:   Denies cough or sputum porduction, shortness of breath,wheezing, hemoptysis,  Gi: Denies swallowing difficulty, stomach pain, nausea or vomiting, diarrhea, constipation, bowel incontinence Gu:  Denies bladder incontinence, burning urine Ext:   Denies Joint pain, stiffness or swelling Skin: Denies  skin rash, easy bruising or bleeding or  hives Endoc:  Denies polyuria, polydipsia , polyphagia or weight change Psych:   Denies depression, insomnia or hallucinations   Other:  All other systems negative   VS: BP 140/75 (BP Location: Right Arm)    Pulse 72    Temp 97.8 F (36.6 C) (Oral)    Resp 20    Ht 5\' 11"  (1.803 m)    Wt 98.2 kg    SpO2 97%    BMI 30.20 kg/m      PHYSICAL EXAM    GENERAL:NAD, no fevers, chills, no weakness no fatigue HEAD: Normocephalic, atraumatic.  EYES: Pupils equal, round, reactive to light. Extraocular muscles intact. No scleral icterus.  MOUTH: Moist mucosal membrane. Dentition intact. No abscess noted.  EAR, NOSE, THROAT: Clear without exudates. No external lesions.  NECK: Supple. No thyromegaly. No nodules. No JVD.  PULMONARY: Clear to ausculatation bilaterally with Decreased air entry on right CARDIOVASCULAR: S1 and S2. Regular rate and rhythm. No murmurs, rubs, or gallops. No edema. Pedal pulses 2+ bilaterally.  GASTROINTESTINAL: Soft, nontender, nondistended. No masses. Positive bowel sounds. No hepatosplenomegaly.  MUSCULOSKELETAL: No swelling, clubbing, or edema. Range of motion full in all extremities.  NEUROLOGIC: Cranial nerves II through XII are intact. No gross focal neurological deficits. Sensation intact. Reflexes intact.  SKIN: No ulceration, lesions, rashes, or cyanosis. Skin warm and dry. Turgor intact.  PSYCHIATRIC: Mood, affect within normal limits. The patient is awake, alert and oriented x 3. Insight, judgment intact.       IMAGING    DG Chest 1 View  Result Date: 07/04/2019 CLINICAL DATA:  Status post right thoracentesis. EXAM: CHEST  1 VIEW COMPARISON:  CT of the chest on 06/29/2019 FINDINGS: The heart size and mediastinal contours are within normal limits. Residual lateral/posterolateral component of loculated pleural fluid remains on the right. No pneumothorax after right thoracentesis. No pulmonary edema. The visualized skeletal structures are unremarkable.  IMPRESSION: No pneumothorax after right sided thoracentesis. Residual lateral/posterolateral component of loculated right pleural fluid remains. Electronically Signed   By: Aletta Edouard M.D.   On: 07/04/2019 14:26   CT CHEST WO CONTRAST  Result Date: 07/07/2019 CLINICAL DATA:  Follow-up scan for empyema of the RIGHT lung, chest tube placed 2 days ago EXAM: CT CHEST WITHOUT CONTRAST TECHNIQUE: Multidetector CT imaging of the chest was performed following the standard protocol without IV contrast. COMPARISON:  06/28/2009 and intervening interventional and ultrasound evaluations. FINDINGS: Cardiovascular: Calcified atheromatous plaque in the thoracic aorta similar to prior study. No aneurysmal dilation. Heart size is stable without pericardial effusion. Calcified coronary artery disease as before. Vessels not well assessed given lack of intravenous contrast. Mediastinum/Nodes: No thoracic inlet adenopathy. No axillary lymphadenopathy. No mediastinal adenopathy. Esophagus  grossly normal. Lungs/Pleura: Since the previous examination there is been interval placement of a RIGHT-sided chest tube. A small amount of subcutaneous emphysema is noted. The marker for chest tube sideholes is at least 1.5 cm within the RIGHT chest cavity within the pleural space. Pleural fluid at the RIGHT lung base is perhaps slightly diminished but there is no change in the loculated fluid along the fissure in the RIGHT chest when compared to the prior exam RIGHT basilar consolidative changes and volume loss are similar to the prior study. LEFT chest is clear. Airways are patent. Upper Abdomen: Upper abdominal contents are unremarkable. No acute process in the upper abdomen. Musculoskeletal: Small amount of subcutaneous emphysema along the RIGHT chest wall as discussed. No chest wall mass. No acute bone process. Spinal degenerative change. IMPRESSION: 1. Slightly diminished RIGHT pleural fluid, dominant loculated area along the major  fissure in the RIGHT chest is unchanged. 2. Persistent basilar consolidation/volume loss without change. This may represent rounded atelectasis or pneumonia. Follow-up is suggested to ensure there is no underlying mass. 3. Small amount of subcutaneous emphysema along the chest wall. Marker for sideholes is within the pleural space as discussed above. 4. Atherosclerotic changes in the thoracic aorta. Calcified coronary artery disease. 5. Aortic atherosclerosis. Aortic Atherosclerosis (ICD10-I70.0). Electronically Signed   By: Zetta Bills M.D.   On: 07/07/2019 16:33   CT CHEST WO CONTRAST  Result Date: 06/29/2019 CLINICAL DATA:  Pleural effusion, shortness of breath. Follow-up chest x-ray EXAM: CT CHEST WITHOUT CONTRAST TECHNIQUE: Multidetector CT imaging of the chest was performed following the standard protocol without IV contrast. COMPARISON:  Chest x-ray 05/11/2019 FINDINGS: Cardiovascular: Heart is normal size. Aorta normal caliber. Scattered coronary artery and aortic calcifications. Mediastinum/Nodes: No mediastinal, hilar, or axillary adenopathy. Trachea and esophagus are unremarkable. Thyroid unremarkable. Lungs/Pleura: Partially loculated moderate-sized right pleural effusion noted. Compressive atelectasis or pneumonia in the right lower lobe. Scattered granulomas in the right lung. Left lung clear. Upper Abdomen: Imaging into the upper abdomen shows no acute findings. Musculoskeletal: Chest wall soft tissues are unremarkable. No acute bony abnormality. IMPRESSION: Moderate partially loculated right pleural effusion. Right lower lobe compressive atelectasis versus pneumonia. Electronically Signed   By: Rolm Baptise M.D.   On: 06/29/2019 14:56   DG Chest Port 1 View  Result Date: 07/07/2019 CLINICAL DATA:  Follow-up right chest tube. EXAM: PORTABLE CHEST 1 VIEW COMPARISON:  07/06/2018 FINDINGS: Chest tube remains in place at the right lung base. There is slightly less pleural fluid than was seen  yesterday. No pleural air is visible. Some pleural fluid does persist, with right lower lung atelectasis. Left hemithorax appears clear. IMPRESSION: Persistent right chest tube. Slightly less right pleural fluid and less pleural air. Moderate amount of pleural fluid does persist with lower right lung atelectasis. Electronically Signed   By: Nelson Chimes M.D.   On: 07/07/2019 11:55   DG Chest Port 1 View  Result Date: 07/06/2019 CLINICAL DATA:  Right empyema. EXAM: PORTABLE CHEST 1 VIEW COMPARISON:  Chest x-ray dated Jul 04, 2019. FINDINGS: New right pigtail chest tube with unchanged small loculated pleural effusion. The left lung is clear. No pneumothorax. The heart size and mediastinal contours are within normal limits. Normal pulmonary vascularity. No acute osseous abnormality. IMPRESSION: 1. New right-sided chest tube with unchanged small loculated pleural effusion. No pneumothorax. Electronically Signed   By: Titus Dubin M.D.   On: 07/06/2019 09:17   CT IMAGE GUIDED DRAINAGE BY PERCUTANEOUS CATHETER  Result Date: 07/05/2019 INDICATION: Right  empyema EXAM: CT-GUIDED PLACEMENT RIGHT 10 FRENCH CHEST TUBE MEDICATIONS: The patient is currently admitted to the hospital and receiving intravenous antibiotics. The antibiotics were administered within an appropriate time frame prior to the initiation of the procedure. ANESTHESIA/SEDATION: Fentanyl 50 mcg IV; Versed 1.0 mg IV Moderate Sedation Time:  11 MINUTES The patient was continuously monitored during the procedure by the interventional radiology nurse under my direct supervision. COMPLICATIONS: None immediate. PROCEDURE: Informed written consent was obtained from the patient after a thorough discussion of the procedural risks, benefits and alternatives. All questions were addressed. Maximal Sterile Barrier Technique was utilized including caps, mask, sterile gowns, sterile gloves, sterile drape, hand hygiene and skin antiseptic. A timeout was performed  prior to the initiation of the procedure. Previous imaging reviewed. Patient positioned right anterior oblique. Noncontrast localization CT performed. The loculated right effusion was localized and marked for a lateral posterior approach. Under sterile conditions and local anesthesia, 18 gauge introducer needle was advanced from a posterolateral lower intercostal approach into the pleural fluid. Needle position confirmed with CT. Guidewire inserted followed by tract dilatation insert a 10 French drain. Drain catheter position confirmed with CT. Syringe aspiration yielded 10 cc of thick mucinous exudative fluid. Sample sent for pan culture. Catheter secured with Prolene suture and connected to external pleura vac. Sterile dressing applied. No immediate complication. Patient tolerated the procedure well. IMPRESSION: Successful CT-guided 10 French right chest tube insertion. Electronically Signed   By: Jerilynn Mages.  Shick M.D.   On: 07/05/2019 15:44   US THORACENTESIS ASP PLEURAL SPACE W/IMG GUIDE  Result Date: 07/04/2019 CLINICAL DATA:  Loculated right pleural effusion. EXAM: ULTRASOUND GUIDED RIGHT THORACENTESIS COMPARISON:  CT of the chest on 06/29/2019 PROCEDURE: An ultrasound guided thoracentesis was thoroughly discussed with the patient and questions answered. The benefits, risks, alternatives and complications were also discussed. The patient understands and wishes to proceed with the procedure. Written consent was obtained. Ultrasound was performed to localize and mark an adequate pocket of fluid in the right chest. The area was then prepped and draped in the normal sterile fashion. 1% Lidocaine was used for local anesthesia. Under ultrasound guidance a 6 French Safe-T-Centesis catheter was introduced. Fluid was removed from the catheter. The catheter was then removed. Under ultrasound guidance, a 5 [place] Yueh centesis catheter was then introduced in a slightly higher pocket of pleural fluid in the right posterior  chest. Aspiration was performed through the centesis catheter. The catheter was removed and a dressing applied. FINDINGS: Ultrasound shows a very complex appearing and loculated right pleural effusion with relatively small components posteriorly. From the first pocket sampled in the right posterior pleural space towards the lung base, only 10 mL of clear appearing amber fluid was able to be aspirated. The sample was sent for various requested labs. Decision was made to sample a second slightly higher pocket yielding 5 mL of grossly purulent whitish green thick fluid. This sample was sent for culture analysis. IMPRESSION: Highly loculated and complex right pleural effusion with relatively small pockets posteriorly. The first pocket yielded 10 mL of clear appearing amber fluid. The second more superior pocket yielded 5 mL of grossly purulent fluid likely consistent with empyema. Fluid analysis will be performed on both samples. Electronically Signed   By: Aletta Edouard M.D.   On: 07/04/2019 14:24      ASSESSMENT/PLAN   Community acquired Pneumonia  -present on admission - due to gram positive cocci such as streptococcus or staph aureus - patient has had numerous courses  of antibiotics on outpatient basis- higher risk for resistant bacteria -s/p thoracentesis with - GPC + - appreciate Infectious disease and pharmD-vanco dcd -procalcitonin trending -MRSA nasal PCR  -bronchopulmonary hygiene with Acapella and IS x10/h -repeat CBC in am 07/09/19 -procalcitonin <.10 -Recruitment maneuvers for atelecatatic right lung - metaneb q4h    Empyema of right pleural space  - +GPCs - s/p 36F pigtail cathter -fluid studies resent today and recultured -CT surgery on case Dr Genevive Bi - intrapleural tPA currently- appreciate input    Thank you for allowing me to participate in the care of this patient.  .   Patient/Family are satisfied with care plan and all questions have been answered.  This document was  prepared using Dragon voice recognition software and may include unintentional dictation errors.     Ottie Glazier, M.D.  Division of [facility]

## 2019-07-09 ENCOUNTER — Inpatient Hospital Stay: Payer: PPO

## 2019-07-09 LAB — CBC WITH DIFFERENTIAL/PLATELET
Abs Immature Granulocytes: 0.06 10*3/uL (ref 0.00–0.07)
Basophils Absolute: 0.1 10*3/uL (ref 0.0–0.1)
Basophils Relative: 1 %
Eosinophils Absolute: 0.3 10*3/uL (ref 0.0–0.5)
Eosinophils Relative: 3 %
HCT: 35.4 % — ABNORMAL LOW (ref 39.0–52.0)
Hemoglobin: 11.5 g/dL — ABNORMAL LOW (ref 13.0–17.0)
Immature Granulocytes: 1 %
Lymphocytes Relative: 12 %
Lymphs Abs: 1.3 10*3/uL (ref 0.7–4.0)
MCH: 28 pg (ref 26.0–34.0)
MCHC: 32.5 g/dL (ref 30.0–36.0)
MCV: 86.1 fL (ref 80.0–100.0)
Monocytes Absolute: 1 10*3/uL (ref 0.1–1.0)
Monocytes Relative: 10 %
Neutro Abs: 7.9 10*3/uL — ABNORMAL HIGH (ref 1.7–7.7)
Neutrophils Relative %: 73 %
Platelets: 221 10*3/uL (ref 150–400)
RBC: 4.11 MIL/uL — ABNORMAL LOW (ref 4.22–5.81)
RDW: 13.4 % (ref 11.5–15.5)
WBC: 10.6 10*3/uL — ABNORMAL HIGH (ref 4.0–10.5)
nRBC: 0 % (ref 0.0–0.2)

## 2019-07-09 NOTE — Progress Notes (Signed)
[facility] Hospital Day(s): 4.   Post op day(s):  Marland Kitchen   Interval History: Patient seen and examined, no acute events or new complaints overnight. Patient reports feeling well.  He denies chest pain.  He denies shortness of breath.  There is no clear ideation.  There is no alleviating or aggravating factor.  He denies fever or chills.  Vital signs in last 24 hours: [min-max] current  Temp:  [97.8 F (36.6 C)-98.9 F (37.2 C)] 98.1 F (36.7 C) (05/16 1939) Pulse Rate:  [68-82] 75 (05/16 1939) Resp:  [17-20] 20 (05/16 1939) BP: (129-151)/(59-77) 151/59 (05/16 1939) SpO2:  [93 %-96 %] 95 % (05/16 1939) Weight:  [98.4 kg] 98.4 kg (05/16 0449)     Height: 5\' 11"  (180.3 cm) Weight: 98.4 kg BMI (Calculated): 30.26   Physical Exam:  Constitutional: alert, cooperative and no distress  Respiratory: breathing non-labored at rest  Cardiovascular: regular rate and sinus rhythm  Gastrointestinal: soft, non-tender, and non-distended  Labs:  CBC Latest Ref Rng & Units 07/09/2019 07/07/2019 07/06/2019  WBC 4.0 - 10.5 K/uL 10.6(H) 12.3(H) 10.3  Hemoglobin 13.0 - 17.0 g/dL 11.5(L) 12.1(L) 12.2(L)  Hematocrit 39.0 - 52.0 % 35.4(L) 37.0(L) 36.0(L)  Platelets 150 - 400 K/uL 221 261 257   CMP Latest Ref Rng & Units 07/07/2019 07/06/2019 07/05/2019  Glucose 70 - 99 mg/dL 152(H) 123(H) 150(H)  BUN 8 - 23 mg/dL 8 10 10   Creatinine 0.61 - 1.24 mg/dL 0.65 0.59(L) 0.60(L)  Sodium 135 - 145 mmol/L 138 137 135  Potassium 3.5 - 5.1 mmol/L 3.8 3.5 3.4(L)  Chloride 98 - 111 mmol/L 104 103 97(L)  CO2 22 - 32 mmol/L 25 26 26   Calcium 8.9 - 10.3 mg/dL 8.5(L) 8.5(L) 9.1  Total Protein 6.5 - 8.1 g/dL - - 8.1  Total Bilirubin 0.3 - 1.2 mg/dL - - 0.8  Alkaline Phos 38 - 126 U/L - - 57  AST 15 - 41 U/L - - 24  ALT 0 - 44 U/L - - 30     Assessment/Plan:  74 y.o. male with right chest empyema s/p pigtail chest tube, complicated by pertinent comorbidities including pneumonia, hyperlipidemia,  hypertension, obesity, sleep apnea.  I personally evaluated the chest x-ray.  There is no change from pleural effusion.  We will continue with chest tube in place.  We will continue with IV antibiotic therapy.  Arnold Long, MD

## 2019-07-09 NOTE — Plan of Care (Signed)

## 2019-07-09 NOTE — Progress Notes (Signed)
PROGRESS NOTE    Andrew Myers  [name] DOB: 03/20/1945 DOA: 07/05/2019 PCP: Derinda Late, MD    Brief Narrative:  Andrew Myers is a 74 y.o. male with medical history significant of recent pna, htn, hyperglycemia, obesity, sleep apnea sent to ED by pulmonology for treatment of empyema.  Patient reported in March of this year he was treated for LLL infiltrate.  He completed 3 rounds antibiotics and steroids. By 4/29 he remained symptomatic.  Associated symptoms include intermittent fevers with max temp 99.9, intermittent right chest stabbing pain worse with deep breath some mild shortness of breath and general malaise.  He denies headache dizziness syncope or near syncope.  He denies chest pain palpitations lower extremity edema.  He denies abdominal pain nausea vomiting diarrhea constipation melena bright red blood per rectum.  He denies any dysuria hematuria frequency or urgency.  CT chest revealed pleural effusion. Underwent thoracentesis 07/04/19 yielding 21ml fluid. Fluid studies revealed gram +bacteria concerning for empyema. Patient was called at home and instructed to come to ED for admission.    Consultants:   ID, pulmonary, CT surgery  Procedures: Chest tube  Antimicrobials:   Zosyn Vanco   Subjective: Has no complaints.  Denies any shortness of breath, fever or chills  Objective: Vitals:   07/08/19 1911 07/09/19 0449 07/09/19 0715 07/09/19 1131  BP: 139/75 129/77 (!) 141/65 (!) 146/76  Pulse: 78 82 75 75  Resp: 19 19 17 18   Temp: 98.1 F (36.7 C) 98.7 F (37.1 C) 98.9 F (37.2 C) 97.8 F (36.6 C)  TempSrc: Oral Oral Oral   SpO2: 97% 96% 95% 95%  Weight:  98.4 kg    Height:        Intake/Output Summary (Last 24 hours) at 07/09/2019 1420 Last data filed at 07/09/2019 1130 Gross per 24 hour  Intake 1656.5 ml  Output 1360 ml  Net 296.5 ml   Filed Weights   07/07/19 0300 07/08/19 0428 07/09/19 0449  Weight: 99.7 kg 98.2 kg 98.4 kg     Examination:  General exam: Appears calm and comfortable, NAD Respiratory system: Clear to auscultation. Respiratory effort normal.no wheezing or rhonchi Right chest tube in place with minimal bloody drainage Cardiovascular system: S1 & S2 heard, RRR. No JVD, murmurs, rubs, gallops or clicks.  Gastrointestinal system: Abdomen is nondistended, soft and nontender. Normal bowel sounds heard.  No rebound Central nervous system: Alert and oriented.  Grossly intact Extremities: No edema  Skin: Warm dry Psychiatry: Judgement and insight appear normal. Mood & affect appropriate.     Data Reviewed: I have personally reviewed following labs and imaging studies  CBC: Recent Labs  Lab 07/05/19 1036 07/06/19 0502 07/07/19 0438 07/09/19 0616  WBC 11.5* 10.3 12.3* 10.6*  NEUTROABS 8.5*  --   --  7.9*  HGB 13.4 12.2* 12.1* 11.5*  HCT 39.5 36.0* 37.0* 35.4*  MCV 84.4 83.7 86.7 86.1  PLT 308 257 261 [id number]   Basic Metabolic Panel: Recent Labs  Lab 07/05/19 1036 07/06/19 0502 07/07/19 0438  NA 135 137 138  K 3.4* 3.5 3.8  CL 97* 103 104  CO2 26 26 25   GLUCOSE 150* 123* 152*  BUN 10 10 8   CREATININE 0.60* 0.59* 0.65  CALCIUM 9.1 8.5* 8.5*   GFR: Estimated Creatinine Clearance: 99.8 mL/min (by C-G formula based on SCr of 0.65 mg/dL). Liver Function Tests: Recent Labs  Lab 07/05/19 1036  AST 24  ALT 30  ALKPHOS 57  BILITOT 0.8  PROT 8.1  ALBUMIN 3.5   No results for input(s): LIPASE, AMYLASE in the last 168 hours. No results for input(s): AMMONIA in the last 168 hours. Coagulation Profile: No results for input(s): INR, PROTIME in the last 168 hours. Cardiac Enzymes: No results for input(s): CKTOTAL, CKMB, CKMBINDEX, TROPONINI in the last 168 hours. BNP (last 3 results) No results for input(s): PROBNP in the last 8760 hours. HbA1C: No results for input(s): HGBA1C in the last 72 hours. CBG: No results for input(s): GLUCAP in the last 168 hours. Lipid Profile: No  results for input(s): CHOL, HDL, LDLCALC, TRIG, CHOLHDL, LDLDIRECT in the last 72 hours. Thyroid Function Tests: No results for input(s): TSH, T4TOTAL, FREET4, T3FREE, THYROIDAB in the last 72 hours. Anemia Panel: No results for input(s): VITAMINB12, FOLATE, FERRITIN, TIBC, IRON, RETICCTPCT in the last 72 hours. Sepsis Labs: Recent Labs  Lab 07/05/19 1036 07/06/19 0502 07/07/19 0438  PROCALCITON <0.10 <0.10 <0.10  LATICACIDVEN 1.9  --   --     Recent Results (from the past 240 hour(s))  SARS Coronavirus 2 by RT PCR (hospital order, performed in [facility] hospital lab) Nasopharyngeal Nasopharyngeal Swab     Status: None   Collection Time: 07/04/19 10:41 AM   Specimen: Nasopharyngeal Swab  Result Value Ref Range Status   SARS Coronavirus 2 NEGATIVE NEGATIVE Final    Comment: (NOTE) SARS-CoV-2 target nucleic acids are NOT DETECTED. The SARS-CoV-2 RNA is generally detectable in upper and lower respiratory specimens during the acute phase of infection. The lowest concentration of SARS-CoV-2 viral copies this assay can detect is 250 copies / mL. A negative result does not preclude SARS-CoV-2 infection and should not be used as the sole basis for treatment or other patient management decisions.  A negative result may occur with improper specimen collection / handling, submission of specimen other than nasopharyngeal swab, presence of viral mutation(s) within the areas targeted by this assay, and inadequate number of viral copies (<250 copies / mL). A negative result must be combined with clinical observations, patient history, and epidemiological information. Fact Sheet for Patients:   StrictlyIdeas.no Fact Sheet for Healthcare Providers: BankingDealers.co.za This test is not yet approved or cleared  by the [place] FDA and has been authorized for detection and/or diagnosis of SARS-CoV-2 by FDA under an Emergency Use Authorization  (EUA).  This EUA will remain in effect (meaning this test can be used) for the duration of the COVID-19 declaration under Section 564(b)(1) of the Act, 21 U.S.C. section 360bbb-3(b)(1), unless the authorization is terminated or revoked sooner. Performed at [facility], [address] [place], [place] [address]   Body fluid culture     Status: None   Collection Time: 07/04/19  1:00 PM   Specimen: PATH Cytology Pleural fluid  Result Value Ref Range Status   Specimen Description   Final    PLEURAL Performed at [facility], [address], [place], [place] [address]    Special Requests   Final    NONE Performed at [facility], [address] [place], [place] [address]    Gram Stain   Final    FEW WBC PRESENT,BOTH PMN AND MONONUCLEAR FEW GRAM POSITIVE COCCI    Culture   Final    NO GROWTH 3 DAYS Performed at [facility], [place] [address], [place], [place] [address]    Report Status 07/08/2019 FINAL  Final  Acid Fast Smear (AFB)     Status: None   Collection Time: 07/04/19  1:00 PM   Specimen: PATH Cytology Pleural fluid  Result Value Ref Range Status   AFB Specimen Processing Concentration  Final   Acid Fast Smear Negative  Final    Comment: (NOTE) Performed At: [facility] [place], [place] [id number] Rush Farmer MD [id number]    Source (AFB) PLEURAL  Final    Comment: Performed at [facility], [address] [place], [place] [address]  Blood culture (routine x 2)     Status: None (Preliminary result)   Collection Time: 07/05/19  1:14 PM   Specimen: BLOOD  Result Value Ref Range Status   Specimen Description BLOOD LEFT ANTECUBITAL  Final   Special Requests   Final    BOTTLES DRAWN AEROBIC AND ANAEROBIC Blood Culture results may not be optimal due to an excessive volume of blood received in culture bottles   Culture   Final    NO GROWTH 4 DAYS Performed at [facility], [address] [place], [place] [address]    Report Status PENDING  Incomplete  Blood culture (routine x 2)     Status: None (Preliminary result)   Collection Time: 07/05/19  1:14 PM   Specimen: BLOOD  Result Value Ref Range Status   Specimen Description BLOOD RIGHT ANTECUBITAL  Final   Special Requests   Final    BOTTLES DRAWN AEROBIC AND ANAEROBIC Blood Culture results may not be optimal due to an inadequate volume of blood received in culture bottles   Culture   Final    NO GROWTH 4 DAYS Performed at [facility], [address], [place], [place] [address]    Report Status PENDING  Incomplete  Aerobic/Anaerobic Culture (surgical/deep wound)     Status: None (Preliminary result)   Collection Time: 07/05/19  3:45 PM   Specimen: Fluid; Abscess  Result Value Ref Range Status   Specimen Description FLUID RIGHT PLEURAL  Final   Special Requests NONE  Final   Gram Stain   Final    RARE WBC PRESENT,BOTH PMN AND MONONUCLEAR NO ORGANISMS SEEN    Culture   Final    NO GROWTH 4 DAYS NO ANAEROBES ISOLATED; CULTURE IN PROGRESS FOR 5 DAYS Performed at [facility], [place] [address] [place], [place] [address]    Report Status PENDING  Incomplete  Acid Fast Smear (AFB)     Status: None   Collection Time: 07/05/19  3:45 PM   Specimen: Pleural, Right  Result Value Ref Range Status   AFB Specimen Processing Concentration  Final   Acid Fast Smear Negative  Final    Comment: (NOTE) Performed At: [facility] [address] [place], [place] [id number] Rush Farmer MD [id number]    Source (AFB) LUNG  Final    Comment: RIGHT Performed at [facility], [address] [place], [place] [address]   MRSA PCR Screening     Status: None   Collection Time: 07/05/19 11:05 PM  Result Value Ref Range Status   MRSA by PCR NEGATIVE NEGATIVE Final    Comment:        The GeneXpert MRSA Assay (FDA approved for NASAL specimens only), is one component of a comprehensive MRSA  colonization surveillance program. It is not intended to diagnose MRSA infection nor to guide or monitor treatment for MRSA infections. Performed at [facility], [address], [place], [place] [address]          Radiology Studies: [facility] Chest [place] 1  View  Result Date: 07/09/2019 CLINICAL DATA:  Empyema, RIGHT-sided chest tube in place. EXAM: PORTABLE CHEST 1 VIEW COMPARISON:  Chest x-ray dated 07/07/2019. FINDINGS: RIGHT basilar chest tube appears stable in position. The overlying RIGHT pleural fluid is stable in extent. LEFT lung remains clear. No pneumothorax is seen. Heart size and mediastinal contours are stable. IMPRESSION: Stable chest x-ray. RIGHT-sided chest tube is stable in position. The overlying RIGHT pleural fluid is stable in extent. Electronically Signed   By: Franki Cabot M.D.   On: 07/09/2019 10:10        Scheduled Meds: . amLODipine  5 mg Oral Daily  . aspirin EC  162 mg Oral Daily  . atorvastatin  20 mg Oral Daily  . hydrochlorothiazide  25 mg Oral Daily  . irbesartan  37.5 mg Oral Daily  . loratadine  10 mg Oral Daily  . nebivolol  5 mg Oral Daily  . tamsulosin  0.8 mg Oral QPC supper   Continuous Infusions: . sodium chloride 100 mL/hr at 07/09/19 1220  . ampicillin-sulbactam (UNASYN) IV 3 g (07/09/19 1223)    Assessment & Plan:   Principal Problem:   Empyema ([place]) Active Problems:   Hypertension   Hyperlipidemia   BPH (benign prostatic hyperplasia)   Hypokalemia   Obesity   Sleep apnea   Hyperglycemia   Empyema of right pleural space ([place])   #1.Rt  Empyema.  Patient underwent a thoracentesis May 11 and fluid studies positive for gram-positive bacteria.  He was instructed to come to the emergency department for admission/treatment. -Continue IV antibiotics. Will place patient on Vancomycin and Zosyn per pharmacy dosing -Follow blood cultures -Gentle IV fluids -Supportive therapy -Chest tube placed, s/p intrapleural  thrombolytics (TPA) on 5/13 CTS ordered CT yesterday. Per surgery TPA v.s. pigtail by IR.. ID input was appreciated, d/c vanco since MRSA negative.  cotninue zosyn for now Plan: TPA in am?    #2.  Hypokalemia.  Mild.  Likely related to diuretic  improved with replacement Ck am labs  #3.  Hypertension.  Stable in the emergency department.  Home medications include amlodipine, hydrochlorothiazide, Bystolic, valsartan. -Resumed home meds -Monitor  #4.  Sleep apnea.  Patient reports he uses a home BiPAP. -Respiratory consult for device  #5.  BPH.  Stable at baseline. -Continue home meds  #6.  Obesity.  BMI 32. -nutritional consult  #7.  Hyperlipidemia.   Continue statins  #8.  Hyperglycemia. No hx of same. Likely related to recent  steroid use.  -   #9. Pulmonary Hypertension Pt reports not having this . Takes Sildenafil for ED , not tid.  It has been changed   DVT prophylaxis: SCD Code Status: Full Family Communication: None at bedside Disposition Plan: Back to previous home life Barrier: Has chest tube in place needs further surgical and medical management, currently not medically stable for discharge, also requires iv abx and TPA in am       LOS: 4 days   Time spent: 45 minutes with more than 50% COC    Nolberto Hanlon, MD [facility] Pager [phone]  If 7PM-7AM, please contact night-coverage [email or web address] Password [facility] 07/09/2019, 2:20 PM Patient ID: Andrew Myers, male   DOB: August 04, 1945, 74 y.o.   MRN: [id number]

## 2019-07-09 NOTE — Progress Notes (Signed)
Pulmonary Medicine          Date: 07/09/2019,   MRN# [id number] Andrew Myers [birth year]     AdmissionWeight: 103.4 kg                 CurrentWeight: 98.4 kg   Refering physician:Dr Agbata   CHIEF COMPLAINT:   Pneumonia with Empyema   SUBJECTIVE   -Patient is laying in bed in no distress.   -He slept well, used his own CPAP.   -[facility] changed to new one, no drainage from chest tube.   -Patient is stable in no distress.   -reviewed CXR from today 07/09/19 - stable effusion  - blood work with improved leukocytosis 07/09/19 - -  PAST MEDICAL HISTORY   Past Medical History:  Diagnosis Date  . BPH (benign prostatic hyperplasia)   . CAP (community acquired pneumonia) 04/2019  . Empyema lung ([place]) 06/2019  . Hyperlipidemia   . Hypertension   . Obesity   . Pleural effusion on right 05/2019  . Skin cancer    BCC, SCC  . Sleep apnea   . Sleep apnea      SURGICAL HISTORY   Past Surgical History:  Procedure Laterality Date  . APPENDECTOMY    . CATARACT EXTRACTION    . FRACTURE SURGERY       FAMILY HISTORY   Family History  Problem Relation Age of Onset  . Breast cancer Mother   . Hypertension Mother   . Hyperlipidemia Mother   . Heart failure Mother   . Heart attack Father   . Thyroid cancer Sister      SOCIAL HISTORY   Social History   Tobacco Use  . Smoking status: Former Smoker    Quit date: 05/07/1967    Years since quitting: 52.2  . Smokeless tobacco: Never Used  Substance Use Topics  . Alcohol use: Yes    Comment: rarely  . Drug use: No     MEDICATIONS    Home Medication:    Current Medication:  Current Facility-Administered Medications:  .  0.9 %  sodium chloride infusion, , Intravenous, Continuous, Agbata, Tochukwu, MD, Last Rate: 100 mL/hr at 07/09/19 1220, New Bag at 07/09/19 1220 .  acetaminophen (TYLENOL) tablet 650 mg, 650 mg, Oral, Q6H PRN **OR** acetaminophen (TYLENOL) suppository 650 mg, 650 mg,  Rectal, Q6H PRN, Agbata, Tochukwu, MD .  amLODipine (NORVASC) tablet 5 mg, 5 mg, Oral, Daily, Agbata, Tochukwu, MD, 5 mg at 07/09/19 1218 .  Ampicillin-Sulbactam (UNASYN) 3 g in sodium chloride 0.9 % 100 mL IVPB, 3 g, Intravenous, Q6H, Ravishankar, Jayashree, MD, Last Rate: 200 mL/hr at 07/09/19 1223, 3 g at 07/09/19 1223 .  aspirin EC tablet 162 mg, 162 mg, Oral, Daily, Lu Duffel, RPH, 162 mg at 07/09/19 1216 .  atorvastatin (LIPITOR) tablet 20 mg, 20 mg, Oral, Daily, Agbata, Tochukwu, MD, 20 mg at 07/08/19 1708 .  hydrochlorothiazide (HYDRODIURIL) tablet 25 mg, 25 mg, Oral, Daily, Agbata, Tochukwu, MD, 25 mg at 07/09/19 1218 .  irbesartan (AVAPRO) tablet 37.5 mg, 37.5 mg, Oral, Daily, Agbata, Tochukwu, MD, 37.5 mg at 07/09/19 1228 .  loratadine (CLARITIN) tablet 10 mg, 10 mg, Oral, Daily, Agbata, Tochukwu, MD, 10 mg at 07/09/19 1219 .  nebivolol (BYSTOLIC) tablet 5 mg, 5 mg, Oral, Daily, Agbata, Tochukwu, MD, 5 mg at 07/09/19 1224 .  ondansetron (ZOFRAN) tablet 4 mg, 4 mg, Oral, Q6H PRN **OR** ondansetron (ZOFRAN) injection 4 mg, 4 mg, Intravenous, Q6H PRN, Agbata,  Tochukwu, MD .  tamsulosin (FLOMAX) capsule 0.8 mg, 0.8 mg, Oral, QPC supper, Agbata, Tochukwu, MD, 0.8 mg at 07/08/19 1708    ALLERGIES   Codeine     REVIEW OF SYSTEMS    Review of Systems:  Gen:  Denies  fever, sweats, chills weigh loss  HEENT: Denies blurred vision, double vision, ear pain, eye pain, hearing loss, nose bleeds, sore throat Cardiac:  No dizziness, chest pain or heaviness, chest tightness,edema Resp:   Denies cough or sputum porduction, shortness of breath,wheezing, hemoptysis,  Gi: Denies swallowing difficulty, stomach pain, nausea or vomiting, diarrhea, constipation, bowel incontinence Gu:  Denies bladder incontinence, burning urine Ext:   Denies Joint pain, stiffness or swelling Skin: Denies  skin rash, easy bruising or bleeding or hives Endoc:  Denies polyuria, polydipsia , polyphagia or  weight change Psych:   Denies depression, insomnia or hallucinations   Other:  All other systems negative   VS: BP (!) 146/76 (BP Location: Right Arm)   Pulse 75   Temp 97.8 F (36.6 C)   Resp 18   Ht 5\' 11"  (1.803 m)   Wt 98.4 kg   SpO2 95%   BMI 30.25 kg/m      PHYSICAL EXAM    GENERAL:NAD, no fevers, chills, no weakness no fatigue HEAD: Normocephalic, atraumatic.  EYES: Pupils equal, round, reactive to light. Extraocular muscles intact. No scleral icterus.  MOUTH: Moist mucosal membrane. Dentition intact. No abscess noted.  EAR, NOSE, THROAT: Clear without exudates. No external lesions.  NECK: Supple. No thyromegaly. No nodules. No JVD.  PULMONARY: Clear to ausculatation bilaterally with Decreased air entry on right CARDIOVASCULAR: S1 and S2. Regular rate and rhythm. No murmurs, rubs, or gallops. No edema. Pedal pulses 2+ bilaterally.  GASTROINTESTINAL: Soft, nontender, nondistended. No masses. Positive bowel sounds. No hepatosplenomegaly.  MUSCULOSKELETAL: No swelling, clubbing, or edema. Range of motion full in all extremities.  NEUROLOGIC: Cranial nerves II through XII are intact. No gross focal neurological deficits. Sensation intact. Reflexes intact.  SKIN: No ulceration, lesions, rashes, or cyanosis. Skin warm and dry. Turgor intact.  PSYCHIATRIC: Mood, affect within normal limits. The patient is awake, alert and oriented x 3. Insight, judgment intact.       IMAGING    DG Chest 1 View  Result Date: 07/04/2019 CLINICAL DATA:  Status post right thoracentesis. EXAM: CHEST  1 VIEW COMPARISON:  CT of the chest on 06/29/2019 FINDINGS: The heart size and mediastinal contours are within normal limits. Residual lateral/posterolateral component of loculated pleural fluid remains on the right. No pneumothorax after right thoracentesis. No pulmonary edema. The visualized skeletal structures are unremarkable. IMPRESSION: No pneumothorax after right sided thoracentesis.  Residual lateral/posterolateral component of loculated right pleural fluid remains. Electronically Signed   By: Aletta Edouard M.D.   On: 07/04/2019 14:26   CT CHEST WO CONTRAST  Result Date: 07/07/2019 CLINICAL DATA:  Follow-up scan for empyema of the RIGHT lung, chest tube placed 2 days ago EXAM: CT CHEST WITHOUT CONTRAST TECHNIQUE: Multidetector CT imaging of the chest was performed following the standard protocol without IV contrast. COMPARISON:  06/28/2009 and intervening interventional and ultrasound evaluations. FINDINGS: Cardiovascular: Calcified atheromatous plaque in the thoracic aorta similar to prior study. No aneurysmal dilation. Heart size is stable without pericardial effusion. Calcified coronary artery disease as before. Vessels not well assessed given lack of intravenous contrast. Mediastinum/Nodes: No thoracic inlet adenopathy. No axillary lymphadenopathy. No mediastinal adenopathy. Esophagus grossly normal. Lungs/Pleura: Since the previous examination there is been interval  placement of a RIGHT-sided chest tube. A small amount of subcutaneous emphysema is noted. The marker for chest tube sideholes is at least 1.5 cm within the RIGHT chest cavity within the pleural space. Pleural fluid at the RIGHT lung base is perhaps slightly diminished but there is no change in the loculated fluid along the fissure in the RIGHT chest when compared to the prior exam RIGHT basilar consolidative changes and volume loss are similar to the prior study. LEFT chest is clear. Airways are patent. Upper Abdomen: Upper abdominal contents are unremarkable. No acute process in the upper abdomen. Musculoskeletal: Small amount of subcutaneous emphysema along the RIGHT chest wall as discussed. No chest wall mass. No acute bone process. Spinal degenerative change. IMPRESSION: 1. Slightly diminished RIGHT pleural fluid, dominant loculated area along the major fissure in the RIGHT chest is unchanged. 2. Persistent basilar  consolidation/volume loss without change. This may represent rounded atelectasis or pneumonia. Follow-up is suggested to ensure there is no underlying mass. 3. Small amount of subcutaneous emphysema along the chest wall. Marker for sideholes is within the pleural space as discussed above. 4. Atherosclerotic changes in the thoracic aorta. Calcified coronary artery disease. 5. Aortic atherosclerosis. Aortic Atherosclerosis (ICD10-I70.0). Electronically Signed   By: Zetta Bills M.D.   On: 07/07/2019 16:33   CT CHEST WO CONTRAST  Result Date: 06/29/2019 CLINICAL DATA:  Pleural effusion, shortness of breath. Follow-up chest x-ray EXAM: CT CHEST WITHOUT CONTRAST TECHNIQUE: Multidetector CT imaging of the chest was performed following the standard protocol without IV contrast. COMPARISON:  Chest x-ray 05/11/2019 FINDINGS: Cardiovascular: Heart is normal size. Aorta normal caliber. Scattered coronary artery and aortic calcifications. Mediastinum/Nodes: No mediastinal, hilar, or axillary adenopathy. Trachea and esophagus are unremarkable. Thyroid unremarkable. Lungs/Pleura: Partially loculated moderate-sized right pleural effusion noted. Compressive atelectasis or pneumonia in the right lower lobe. Scattered granulomas in the right lung. Left lung clear. Upper Abdomen: Imaging into the upper abdomen shows no acute findings. Musculoskeletal: Chest wall soft tissues are unremarkable. No acute bony abnormality. IMPRESSION: Moderate partially loculated right pleural effusion. Right lower lobe compressive atelectasis versus pneumonia. Electronically Signed   By: Rolm Baptise M.D.   On: 06/29/2019 14:56   DG Chest Port 1 View  Result Date: 07/09/2019 CLINICAL DATA:  Empyema, RIGHT-sided chest tube in place. EXAM: PORTABLE CHEST 1 VIEW COMPARISON:  Chest x-ray dated 07/07/2019. FINDINGS: RIGHT basilar chest tube appears stable in position. The overlying RIGHT pleural fluid is stable in extent. LEFT lung remains clear. No  pneumothorax is seen. Heart size and mediastinal contours are stable. IMPRESSION: Stable chest x-ray. RIGHT-sided chest tube is stable in position. The overlying RIGHT pleural fluid is stable in extent. Electronically Signed   By: Franki Cabot M.D.   On: 07/09/2019 10:10   DG Chest Port 1 View  Result Date: 07/07/2019 CLINICAL DATA:  Follow-up right chest tube. EXAM: PORTABLE CHEST 1 VIEW COMPARISON:  07/06/2018 FINDINGS: Chest tube remains in place at the right lung base. There is slightly less pleural fluid than was seen yesterday. No pleural air is visible. Some pleural fluid does persist, with right lower lung atelectasis. Left hemithorax appears clear. IMPRESSION: Persistent right chest tube. Slightly less right pleural fluid and less pleural air. Moderate amount of pleural fluid does persist with lower right lung atelectasis. Electronically Signed   By: Nelson Chimes M.D.   On: 07/07/2019 11:55   DG Chest Port 1 View  Result Date: 07/06/2019 CLINICAL DATA:  Right empyema. EXAM: PORTABLE CHEST 1 VIEW  COMPARISON:  Chest x-ray dated Jul 04, 2019. FINDINGS: New right pigtail chest tube with unchanged small loculated pleural effusion. The left lung is clear. No pneumothorax. The heart size and mediastinal contours are within normal limits. Normal pulmonary vascularity. No acute osseous abnormality. IMPRESSION: 1. New right-sided chest tube with unchanged small loculated pleural effusion. No pneumothorax. Electronically Signed   By: Titus Dubin M.D.   On: 07/06/2019 09:17   CT IMAGE GUIDED DRAINAGE BY PERCUTANEOUS CATHETER  Result Date: 07/05/2019 INDICATION: Right empyema EXAM: CT-GUIDED PLACEMENT RIGHT 10 FRENCH CHEST TUBE MEDICATIONS: The patient is currently admitted to the hospital and receiving intravenous antibiotics. The antibiotics were administered within an appropriate time frame prior to the initiation of the procedure. ANESTHESIA/SEDATION: Fentanyl 50 mcg IV; Versed 1.0 mg IV Moderate  Sedation Time:  11 MINUTES The patient was continuously monitored during the procedure by the interventional radiology nurse under my direct supervision. COMPLICATIONS: None immediate. PROCEDURE: Informed written consent was obtained from the patient after a thorough discussion of the procedural risks, benefits and alternatives. All questions were addressed. Maximal Sterile Barrier Technique was utilized including caps, mask, sterile gowns, sterile gloves, sterile drape, hand hygiene and skin antiseptic. A timeout was performed prior to the initiation of the procedure. Previous imaging reviewed. Patient positioned right anterior oblique. Noncontrast localization CT performed. The loculated right effusion was localized and marked for a lateral posterior approach. Under sterile conditions and local anesthesia, 18 gauge introducer needle was advanced from a posterolateral lower intercostal approach into the pleural fluid. Needle position confirmed with CT. Guidewire inserted followed by tract dilatation insert a 10 French drain. Drain catheter position confirmed with CT. Syringe aspiration yielded 10 cc of thick mucinous exudative fluid. Sample sent for pan culture. Catheter secured with Prolene suture and connected to external pleura vac. Sterile dressing applied. No immediate complication. Patient tolerated the procedure well. IMPRESSION: Successful CT-guided 10 French right chest tube insertion. Electronically Signed   By: Jerilynn Mages.  Shick M.D.   On: 07/05/2019 15:44   US THORACENTESIS ASP PLEURAL SPACE W/IMG GUIDE  Result Date: 07/04/2019 CLINICAL DATA:  Loculated right pleural effusion. EXAM: ULTRASOUND GUIDED RIGHT THORACENTESIS COMPARISON:  CT of the chest on 06/29/2019 PROCEDURE: An ultrasound guided thoracentesis was thoroughly discussed with the patient and questions answered. The benefits, risks, alternatives and complications were also discussed. The patient understands and wishes to proceed with the  procedure. Written consent was obtained. Ultrasound was performed to localize and mark an adequate pocket of fluid in the right chest. The area was then prepped and draped in the normal sterile fashion. 1% Lidocaine was used for local anesthesia. Under ultrasound guidance a 6 French Safe-T-Centesis catheter was introduced. Fluid was removed from the catheter. The catheter was then removed. Under ultrasound guidance, a 5 [place] Yueh centesis catheter was then introduced in a slightly higher pocket of pleural fluid in the right posterior chest. Aspiration was performed through the centesis catheter. The catheter was removed and a dressing applied. FINDINGS: Ultrasound shows a very complex appearing and loculated right pleural effusion with relatively small components posteriorly. From the first pocket sampled in the right posterior pleural space towards the lung base, only 10 mL of clear appearing amber fluid was able to be aspirated. The sample was sent for various requested labs. Decision was made to sample a second slightly higher pocket yielding 5 mL of grossly purulent whitish green thick fluid. This sample was sent for culture analysis. IMPRESSION: Highly loculated and complex right pleural effusion  with relatively small pockets posteriorly. The first pocket yielded 10 mL of clear appearing amber fluid. The second more superior pocket yielded 5 mL of grossly purulent fluid likely consistent with empyema. Fluid analysis will be performed on both samples. Electronically Signed   By: Aletta Edouard M.D.   On: 07/04/2019 14:24      ASSESSMENT/PLAN   Community acquired Pneumonia  -present on admission - due to gram positive cocci such as streptococcus or staph aureus - patient has had numerous courses of antibiotics on outpatient basis- higher risk for resistant bacteria -s/p thoracentesis with - GPC + - appreciate Infectious disease and pharmD-vanco dcd -procalcitonin trending -MRSA nasal PCR    -bronchopulmonary hygiene with Acapella and IS x10/h -repeat CBC in am 07/09/19 -procalcitonin <.10 -Recruitment maneuvers for atelecatatic right lung - metaneb q4h    Empyema of right pleural space  - +GPCs - s/p 50F pigtail cathter -fluid studies resent today and recultured -CT surgery on case Dr Genevive Bi - intrapleural tPA currently- appreciate input    Thank you for allowing me to participate in the care of this patient.  .   Patient/Family are satisfied with care plan and all questions have been answered.  This document was prepared using Dragon voice recognition software and may include unintentional dictation errors.     Ottie Glazier, M.D.  Division of [place]

## 2019-07-10 ENCOUNTER — Inpatient Hospital Stay: Payer: PPO

## 2019-07-10 LAB — CBC
HCT: 31.9 % — ABNORMAL LOW (ref 39.0–52.0)
Hemoglobin: 10.9 g/dL — ABNORMAL LOW (ref 13.0–17.0)
MCH: 28.5 pg (ref 26.0–34.0)
MCHC: 34.2 g/dL (ref 30.0–36.0)
MCV: 83.3 fL (ref 80.0–100.0)
Platelets: 203 10*3/uL (ref 150–400)
RBC: 3.83 MIL/uL — ABNORMAL LOW (ref 4.22–5.81)
RDW: 13.5 % (ref 11.5–15.5)
WBC: 8.2 10*3/uL (ref 4.0–10.5)
nRBC: 0 % (ref 0.0–0.2)

## 2019-07-10 LAB — CYTOLOGY - NON PAP

## 2019-07-10 LAB — BASIC METABOLIC PANEL
Anion gap: 8 (ref 5–15)
BUN: 10 mg/dL (ref 8–23)
CO2: 25 mmol/L (ref 22–32)
Calcium: 8.4 mg/dL — ABNORMAL LOW (ref 8.9–10.3)
Chloride: 105 mmol/L (ref 98–111)
Creatinine, Ser: 0.49 mg/dL — ABNORMAL LOW (ref 0.61–1.24)
GFR calc Af Amer: >60 mL/min (ref >60)
GFR calc non Af Amer: >60 mL/min (ref >60)
Glucose, Bld: 127 mg/dL — ABNORMAL HIGH (ref 70–99)
Potassium: 3.4 mmol/L — ABNORMAL LOW (ref 3.5–5.1)
Sodium: 138 mmol/L (ref 135–145)

## 2019-07-10 LAB — AEROBIC/ANAEROBIC CULTURE W GRAM STAIN (SURGICAL/DEEP WOUND): Culture: NO GROWTH

## 2019-07-10 LAB — CULTURE, BLOOD (ROUTINE X 2)
Culture: NO GROWTH
Culture: NO GROWTH

## 2019-07-10 MED ORDER — POTASSIUM CHLORIDE CRYS ER 20 MEQ PO TBCR
40.0000 meq | EXTENDED_RELEASE_TABLET | Freq: Once | ORAL | Status: AC
  Administered 2019-07-10: 40 meq via ORAL
  Filled 2019-07-10: qty 2

## 2019-07-10 MED ORDER — FENTANYL CITRATE (PF) 100 MCG/2ML IJ SOLN
INTRAMUSCULAR | Status: AC
  Filled 2019-07-10: qty 2

## 2019-07-10 MED ORDER — MIDAZOLAM HCL 2 MG/2ML IJ SOLN
INTRAMUSCULAR | Status: DC | PRN
  Administered 2019-07-10: 1 mg via INTRAVENOUS

## 2019-07-10 MED ORDER — FENTANYL CITRATE (PF) 100 MCG/2ML IJ SOLN
INTRAMUSCULAR | Status: DC | PRN
  Administered 2019-07-10: 50 ug via INTRAVENOUS

## 2019-07-10 MED ORDER — MIDAZOLAM HCL 5 MG/5ML IJ SOLN
INTRAMUSCULAR | Status: AC
  Filled 2019-07-10: qty 5

## 2019-07-10 NOTE — Progress Notes (Signed)
Andrew Myers Follow Up Note  Patient ID: Andrew Myers, male   DOB: 02/05/1946, 74 y.o.   MRN: [id number]  HISTORY: Overall he had a pretty quiet night.  He has no significant pain.  He states that he does have some shortness of breath whenever he moves.  He did have a repeat chest CT done Friday afternoon which I reviewed with our radiologist.  They did not feel that it was amenable to percutaneous drainage.  His chest x-ray today was reviewed with the interventional radiologist who did feel that the area in question may be amenable to drainage.  Therefore we will set him up to have that done today.  He remains afebrile.  He does not have a significant cough.    Vitals:   07/10/19 0731 07/10/19 0852  BP: 135/61   Pulse: 66 76  Resp: (!) 22 18  Temp: 98.5 F (36.9 C)   SpO2: 95% 100%     EXAM:  Resp: Lungs are clear bilaterally though diminished at the right base.  No respiratory distress, normal effort. Heart:  Regular without murmurs Abd:  Abdomen is soft, non distended and non tender. No masses are palpable.  There is no rebound and no guarding.  Neurological: Alert and oriented to person, place, and time. Coordination normal.  Skin: Skin is warm and dry. No rash noted. No diaphoretic. No erythema. No pallor.  Psychiatric: Normal mood and affect. Normal behavior. Judgment and thought content normal.    There is no air leak present.  There is minimal drainage from the tube  ASSESSMENT: Right-sided empyema   PLAN:   We will plan on a percutaneous drainage of his right loculated pleural effusion.  I did review the films with the patient today.  I discussed our plan.  He is in agreement.    Nestor Lewandowsky, MDPatient ID: Andrew Myers, male   DOB: 04-06-1945, 74 y.o.   MRN: [id number]

## 2019-07-10 NOTE — Progress Notes (Signed)
ID Pt had another chest tube placed today Says he is sore  No fever No sob No cough  BP (!) 139/57 (BP Location: Right Arm)   Pulse 74   Temp 98.4 F (36.9 C) (Oral)   Resp 19   Ht 5\' 11"  (1.803 m)   Wt 99.3 kg   SpO2 96%   BMI 30.53 kg/m   O/E awake and alert, no distress, does not look ill Chest decreased air entry rt base 2 chest drains in place Hss1s2 Abd soft\ Cns non focal  LAbs  CBC Latest Ref Rng & Units 07/10/2019 07/09/2019 07/07/2019  WBC 4.0 - 10.5 K/uL 8.2 10.6(H) 12.3(H)  Hemoglobin 13.0 - 17.0 g/dL 10.9(L) 11.5(L) 12.1(L)  Hematocrit 39.0 - 52.0 % 31.9(L) 35.4(L) 37.0(L)  Platelets 150 - 400 K/uL 203 221 261    CMP Latest Ref Rng & Units 07/10/2019 07/07/2019 07/06/2019  Glucose 70 - 99 mg/dL 127(H) 152(H) 123(H)  BUN 8 - 23 mg/dL 10 8 10   Creatinine 0.61 - 1.24 mg/dL 0.49(L) 0.65 0.59(L)  Sodium 135 - 145 mmol/L 138 138 137  Potassium 3.5 - 5.1 mmol/L 3.4(L) 3.8 3.5  Chloride 98 - 111 mmol/L 105 104 103  CO2 22 - 32 mmol/L 25 25 26   Calcium 8.9 - 10.3 mg/dL 8.4(L) 8.5(L) 8.5(L)  Total Protein 6.5 - 8.1 g/dL - - -  Total Bilirubin 0.3 - 1.2 mg/dL - - -  Alkaline Phos 38 - 126 U/L - - -  AST 15 - 41 U/L - - -  ALT 0 - 44 U/L - - -      Pleural fluid from 12/04/2019 WBC 7678 Neutrophil percentage 90 LT H 2000 578 Total protein 4.2 pH 7.2  HIV nonreactive.  Micro 07/04/2019 fluid culture so far no growth.  Gram stain gram-positive cocci seen  Imaging 06/29/2019 moderate partially loculated right pleural effusion with right lower lobe compressive atelectasis versus pneumonia. 07/07/19 Slightly diminished RIGHT pleural fluid, dominant loculated area along the major fissure in the RIGHT chest is unchanged.  Impression/recommendation Right empyema loculated.  Status post pigtail and TPA instillation. And today had 2nd tube placed  Cultures negative -so zosyn changed to unasyn over the weekend  would benefit from VATS/Decortication if the 2  chest drains do not achieve cure Continue IV unasyn  Will evaluate him again later in the week to decide switching to PO and duration depending on clinical findings and  CT chest  Hypertension on amlodipine and hydrochlorothiazide and irbesartan  Hyperlipidemia on atorvastatin. Discussed with patient and care team

## 2019-07-10 NOTE — Procedures (Signed)
Interventional Radiology Procedure:   Indications: Loculated right pleural effusion.  Procedure: CT guided right chest tube placement  Findings: Loculated right pleural effusion.  A new 14 Fr right chest tube was placed.  Yellow pleural fluid was aspirated.    Complications: None     EBL: less than 10 ml  Plan: Follow output and management per CT surgery.     Antoni Stefan R. Anselm Pancoast, MD  Pager: [phone]

## 2019-07-10 NOTE — Plan of Care (Signed)
  Problem: Health Behavior/Discharge Planning: Goal: Ability to manage health-related needs will improve Outcome: Progressing   Problem: Clinical Measurements: Goal: Respiratory complications will improve Outcome: Progressing   Problem: Pain Managment: Goal: General experience of comfort will improve Outcome: Progressing   Problem: Safety: Goal: Ability to remain free from injury will improve Outcome: Progressing   Problem: Clinical Measurements: Goal: Ability to maintain a body temperature in the normal range will improve Outcome: Progressing

## 2019-07-10 NOTE — Progress Notes (Signed)
Pulmonary Medicine          Date: 07/10/2019,   MRN# [id number] Andrew Myers 04-06-1945     AdmissionWeight: 103.4 kg                 CurrentWeight: 99.3 kg   Refering physician:Dr Agbata   CHIEF COMPLAINT:   Pneumonia with Empyema   SUBJECTIVE   -Patient is laying in bed in no distress.   S/p second drain 31F draining hazy yellow fluid.  Patient is stable on room air. -  PAST MEDICAL HISTORY   Past Medical History:  Diagnosis Date  . BPH (benign prostatic hyperplasia)   . CAP (community acquired pneumonia) 04/2019  . Empyema lung ([place]) 06/2019  . Hyperlipidemia   . Hypertension   . Obesity   . Pleural effusion on right 05/2019  . Skin cancer    BCC, SCC  . Sleep apnea   . Sleep apnea      SURGICAL HISTORY   Past Surgical History:  Procedure Laterality Date  . APPENDECTOMY    . CATARACT EXTRACTION    . FRACTURE SURGERY       FAMILY HISTORY   Family History  Problem Relation Age of Onset  . Breast cancer Mother   . Hypertension Mother   . Hyperlipidemia Mother   . Heart failure Mother   . Heart attack Father   . Thyroid cancer Sister      SOCIAL HISTORY   Social History   Tobacco Use  . Smoking status: Former Smoker    Quit date: 05/07/1967    Years since quitting: 52.2  . Smokeless tobacco: Never Used  Substance Use Topics  . Alcohol use: Yes    Comment: rarely  . Drug use: No     MEDICATIONS    Home Medication:    Current Medication:  Current Facility-Administered Medications:  .  0.9 %  sodium chloride infusion, , Intravenous, Continuous, Agbata, Tochukwu, MD, Last Rate: 100 mL/hr at 07/10/19 1514, Rate Verify at 07/10/19 1514 .  acetaminophen (TYLENOL) tablet 650 mg, 650 mg, Oral, Q6H PRN **OR** acetaminophen (TYLENOL) suppository 650 mg, 650 mg, Rectal, Q6H PRN, Agbata, Tochukwu, MD .  amLODipine (NORVASC) tablet 5 mg, 5 mg, Oral, Daily, Agbata, Tochukwu, MD, 5 mg at 07/10/19 0909 .   Ampicillin-Sulbactam (UNASYN) 3 g in sodium chloride 0.9 % 100 mL IVPB, 3 g, Intravenous, Q6H, Ravishankar, Jayashree, MD, Last Rate: 200 mL/hr at 07/10/19 1834, 3 g at 07/10/19 1834 .  aspirin EC tablet 162 mg, 162 mg, Oral, Daily, Lu Duffel, RPH, 162 mg at 07/10/19 1530 .  atorvastatin (LIPITOR) tablet 20 mg, 20 mg, Oral, Daily, Agbata, Tochukwu, MD, 20 mg at 07/10/19 1831 .  fentaNYL (SUBLIMAZE) injection, , Intravenous, PRN, Markus Daft, MD, 50 mcg at 07/10/19 1252 .  hydrochlorothiazide (HYDRODIURIL) tablet 25 mg, 25 mg, Oral, Daily, Agbata, Tochukwu, MD, 25 mg at 07/10/19 0909 .  irbesartan (AVAPRO) tablet 37.5 mg, 37.5 mg, Oral, Daily, Agbata, Tochukwu, MD, 37.5 mg at 07/10/19 0909 .  loratadine (CLARITIN) tablet 10 mg, 10 mg, Oral, Daily, Agbata, Tochukwu, MD, 10 mg at 07/10/19 0909 .  midazolam (VERSED) injection, , Intravenous, PRN, Markus Daft, MD, 1 mg at 07/10/19 1252 .  nebivolol (BYSTOLIC) tablet 5 mg, 5 mg, Oral, Daily, Agbata, Tochukwu, MD, 5 mg at 07/10/19 0909 .  ondansetron (ZOFRAN) tablet 4 mg, 4 mg, Oral, Q6H PRN **OR** ondansetron (ZOFRAN) injection 4 mg, 4 mg, Intravenous, Q6H PRN,  Collier Bullock, MD .  tamsulosin (FLOMAX) capsule 0.8 mg, 0.8 mg, Oral, QPC supper, Agbata, Tochukwu, MD, 0.8 mg at 07/10/19 1831    ALLERGIES   Codeine     REVIEW OF SYSTEMS    Review of Systems:  Gen:  Denies  fever, sweats, chills weigh loss  HEENT: Denies blurred vision, double vision, ear pain, eye pain, hearing loss, nose bleeds, sore throat Cardiac:  No dizziness, chest pain or heaviness, chest tightness,edema Resp:   Denies cough or sputum porduction, shortness of breath,wheezing, hemoptysis,  Gi: Denies swallowing difficulty, stomach pain, nausea or vomiting, diarrhea, constipation, bowel incontinence Gu:  Denies bladder incontinence, burning urine Ext:   Denies Joint pain, stiffness or swelling Skin: Denies  skin rash, easy bruising or bleeding or hives Endoc:   Denies polyuria, polydipsia , polyphagia or weight change Psych:   Denies depression, insomnia or hallucinations   Other:  All other systems negative   VS: BP (!) 139/57 (BP Location: Right Arm)   Pulse 74   Temp 98.4 F (36.9 C) (Oral)   Resp 19   Ht 5\' 11"  (1.803 m)   Wt 99.3 kg   SpO2 96%   BMI 30.53 kg/m      PHYSICAL EXAM    GENERAL:NAD, no fevers, chills, no weakness no fatigue HEAD: Normocephalic, atraumatic.  EYES: Pupils equal, round, reactive to light. Extraocular muscles intact. No scleral icterus.  MOUTH: Moist mucosal membrane. Dentition intact. No abscess noted.  EAR, NOSE, THROAT: Clear without exudates. No external lesions.  NECK: Supple. No thyromegaly. No nodules. No JVD.  PULMONARY: Clear to ausculatation bilaterally with Decreased air entry on right CARDIOVASCULAR: S1 and S2. Regular rate and rhythm. No murmurs, rubs, or gallops. No edema. Pedal pulses 2+ bilaterally.  GASTROINTESTINAL: Soft, nontender, nondistended. No masses. Positive bowel sounds. No hepatosplenomegaly.  MUSCULOSKELETAL: No swelling, clubbing, or edema. Range of motion full in all extremities.  NEUROLOGIC: Cranial nerves II through XII are intact. No gross focal neurological deficits. Sensation intact. Reflexes intact.  SKIN: No ulceration, lesions, rashes, or cyanosis. Skin warm and dry. Turgor intact.  PSYCHIATRIC: Mood, affect within normal limits. The patient is awake, alert and oriented x 3. Insight, judgment intact.       IMAGING    DG Chest 1 View  Result Date: 07/04/2019 CLINICAL DATA:  Status post right thoracentesis. EXAM: CHEST  1 VIEW COMPARISON:  CT of the chest on 06/29/2019 FINDINGS: The heart size and mediastinal contours are within normal limits. Residual lateral/posterolateral component of loculated pleural fluid remains on the right. No pneumothorax after right thoracentesis. No pulmonary edema. The visualized skeletal structures are unremarkable. IMPRESSION: No  pneumothorax after right sided thoracentesis. Residual lateral/posterolateral component of loculated right pleural fluid remains. Electronically Signed   By: Aletta Edouard M.D.   On: 07/04/2019 14:26   CT CHEST WO CONTRAST  Result Date: 07/07/2019 CLINICAL DATA:  Follow-up scan for empyema of the RIGHT lung, chest tube placed 2 days ago EXAM: CT CHEST WITHOUT CONTRAST TECHNIQUE: Multidetector CT imaging of the chest was performed following the standard protocol without IV contrast. COMPARISON:  06/28/2009 and intervening interventional and ultrasound evaluations. FINDINGS: Cardiovascular: Calcified atheromatous plaque in the thoracic aorta similar to prior study. No aneurysmal dilation. Heart size is stable without pericardial effusion. Calcified coronary artery disease as before. Vessels not well assessed given lack of intravenous contrast. Mediastinum/Nodes: No thoracic inlet adenopathy. No axillary lymphadenopathy. No mediastinal adenopathy. Esophagus grossly normal. Lungs/Pleura: Since the previous examination there is  been interval placement of a RIGHT-sided chest tube. A small amount of subcutaneous emphysema is noted. The marker for chest tube sideholes is at least 1.5 cm within the RIGHT chest cavity within the pleural space. Pleural fluid at the RIGHT lung base is perhaps slightly diminished but there is no change in the loculated fluid along the fissure in the RIGHT chest when compared to the prior exam RIGHT basilar consolidative changes and volume loss are similar to the prior study. LEFT chest is clear. Airways are patent. Upper Abdomen: Upper abdominal contents are unremarkable. No acute process in the upper abdomen. Musculoskeletal: Small amount of subcutaneous emphysema along the RIGHT chest wall as discussed. No chest wall mass. No acute bone process. Spinal degenerative change. IMPRESSION: 1. Slightly diminished RIGHT pleural fluid, dominant loculated area along the major fissure in the RIGHT  chest is unchanged. 2. Persistent basilar consolidation/volume loss without change. This may represent rounded atelectasis or pneumonia. Follow-up is suggested to ensure there is no underlying mass. 3. Small amount of subcutaneous emphysema along the chest wall. Marker for sideholes is within the pleural space as discussed above. 4. Atherosclerotic changes in the thoracic aorta. Calcified coronary artery disease. 5. Aortic atherosclerosis. Aortic Atherosclerosis (ICD10-I70.0). Electronically Signed   By: Zetta Bills M.D.   On: 07/07/2019 16:33   CT CHEST WO CONTRAST  Result Date: 06/29/2019 CLINICAL DATA:  Pleural effusion, shortness of breath. Follow-up chest x-ray EXAM: CT CHEST WITHOUT CONTRAST TECHNIQUE: Multidetector CT imaging of the chest was performed following the standard protocol without IV contrast. COMPARISON:  Chest x-ray 05/11/2019 FINDINGS: Cardiovascular: Heart is normal size. Aorta normal caliber. Scattered coronary artery and aortic calcifications. Mediastinum/Nodes: No mediastinal, hilar, or axillary adenopathy. Trachea and esophagus are unremarkable. Thyroid unremarkable. Lungs/Pleura: Partially loculated moderate-sized right pleural effusion noted. Compressive atelectasis or pneumonia in the right lower lobe. Scattered granulomas in the right lung. Left lung clear. Upper Abdomen: Imaging into the upper abdomen shows no acute findings. Musculoskeletal: Chest wall soft tissues are unremarkable. No acute bony abnormality. IMPRESSION: Moderate partially loculated right pleural effusion. Right lower lobe compressive atelectasis versus pneumonia. Electronically Signed   By: Rolm Baptise M.D.   On: 06/29/2019 14:56   DG Chest Port 1 View  Result Date: 07/10/2019 CLINICAL DATA:  Atelectasis EXAM: PORTABLE CHEST 1 VIEW COMPARISON:  Portable exam at 0743 hrs compared to 07/09/2019 FINDINGS: Upper-normal size of cardiac silhouette. Mediastinal contours and pulmonary vascularity normal.  Persistent RIGHT pleural effusion and basilar atelectasis. LEFT lung clear. No acute infiltrate or pneumothorax. Endplate spur formation thoracic spine, mild. IMPRESSION: Persistent RIGHT pleural effusion and basilar atelectasis. Electronically Signed   By: Lavonia Dana M.D.   On: 07/10/2019 08:20   DG Chest Port 1 View  Result Date: 07/09/2019 CLINICAL DATA:  Empyema, RIGHT-sided chest tube in place. EXAM: PORTABLE CHEST 1 VIEW COMPARISON:  Chest x-ray dated 07/07/2019. FINDINGS: RIGHT basilar chest tube appears stable in position. The overlying RIGHT pleural fluid is stable in extent. LEFT lung remains clear. No pneumothorax is seen. Heart size and mediastinal contours are stable. IMPRESSION: Stable chest x-ray. RIGHT-sided chest tube is stable in position. The overlying RIGHT pleural fluid is stable in extent. Electronically Signed   By: Franki Cabot M.D.   On: 07/09/2019 10:10   DG Chest Port 1 View  Result Date: 07/07/2019 CLINICAL DATA:  Follow-up right chest tube. EXAM: PORTABLE CHEST 1 VIEW COMPARISON:  07/06/2018 FINDINGS: Chest tube remains in place at the right lung base. There is slightly  less pleural fluid than was seen yesterday. No pleural air is visible. Some pleural fluid does persist, with right lower lung atelectasis. Left hemithorax appears clear. IMPRESSION: Persistent right chest tube. Slightly less right pleural fluid and less pleural air. Moderate amount of pleural fluid does persist with lower right lung atelectasis. Electronically Signed   By: Nelson Chimes M.D.   On: 07/07/2019 11:55   DG Chest Port 1 View  Result Date: 07/06/2019 CLINICAL DATA:  Right empyema. EXAM: PORTABLE CHEST 1 VIEW COMPARISON:  Chest x-ray dated Jul 04, 2019. FINDINGS: New right pigtail chest tube with unchanged small loculated pleural effusion. The left lung is clear. No pneumothorax. The heart size and mediastinal contours are within normal limits. Normal pulmonary vascularity. No acute osseous  abnormality. IMPRESSION: 1. New right-sided chest tube with unchanged small loculated pleural effusion. No pneumothorax. Electronically Signed   By: Titus Dubin M.D.   On: 07/06/2019 09:17   CT IMAGE GUIDED DRAINAGE BY PERCUTANEOUS CATHETER  Result Date: 07/10/2019 INDICATION: 74 year old with a loculated right pleural effusion. Patient already has one right chest tube but there is residual right pleural fluid and request for a second chest tube placement. EXAM: CT-GUIDED RIGHT CHEST TUBE PLACEMENT MEDICATIONS: Moderate sedation ANESTHESIA/SEDATION: 1.0 mg IV Versed 50 mcg IV Fentanyl Moderate Sedation Time:  18 minutes The patient was continuously monitored during the procedure by the interventional radiology nurse under my direct supervision. COMPLICATIONS: None immediate. TECHNIQUE: Informed written consent was obtained from the patient after a thorough discussion of the procedural risks, benefits and alternatives. All questions were addressed. A timeout was performed prior to the initiation of the procedure. PROCEDURE: Patient was placed prone on the CT scanner. Pocket of fluid along the posterior aspect of the right chest was identified and targeted. The right posterior chest was prepped with chlorhexidine and sterile field was created. Skin and soft tissues were anesthetized with 1% lidocaine. Using CT guidance, a Yueh catheter was directed into the pleural space and yellow fluid was aspirated. Superstiff Amplatz wire was advanced into the pleural space. The tract was dilated to accommodate a 14 [place] multipurpose drain. Small amount of additional pleural fluid was aspirated. Chest tube was attached to the drainage system. Catheter was sutured to skin and a dressing was placed. FINDINGS: Loculated right pleural fluid. Pocket along the right posterior aspect of the chest was identified and targeted for drain placement. 39 French drain was successfully placed in the right pleural space. Small amount of  yellow fluid was removed. Pocket of pleural air noted following chest tube placement compatible with the loculated nature of the pleural effusion. IMPRESSION: CT-guided placement of a right chest tube. Right pleural fluid is loculated. Electronically Signed   By: Markus Daft M.D.   On: 07/10/2019 13:35   CT IMAGE GUIDED DRAINAGE BY PERCUTANEOUS CATHETER  Result Date: 07/05/2019 INDICATION: Right empyema EXAM: CT-GUIDED PLACEMENT RIGHT 10 FRENCH CHEST TUBE MEDICATIONS: The patient is currently admitted to the hospital and receiving intravenous antibiotics. The antibiotics were administered within an appropriate time frame prior to the initiation of the procedure. ANESTHESIA/SEDATION: Fentanyl 50 mcg IV; Versed 1.0 mg IV Moderate Sedation Time:  11 MINUTES The patient was continuously monitored during the procedure by the interventional radiology nurse under my direct supervision. COMPLICATIONS: None immediate. PROCEDURE: Informed written consent was obtained from the patient after a thorough discussion of the procedural risks, benefits and alternatives. All questions were addressed. Maximal Sterile Barrier Technique was utilized including caps, mask, sterile gowns, sterile  gloves, sterile drape, hand hygiene and skin antiseptic. A timeout was performed prior to the initiation of the procedure. Previous imaging reviewed. Patient positioned right anterior oblique. Noncontrast localization CT performed. The loculated right effusion was localized and marked for a lateral posterior approach. Under sterile conditions and local anesthesia, 18 gauge introducer needle was advanced from a posterolateral lower intercostal approach into the pleural fluid. Needle position confirmed with CT. Guidewire inserted followed by tract dilatation insert a 10 French drain. Drain catheter position confirmed with CT. Syringe aspiration yielded 10 cc of thick mucinous exudative fluid. Sample sent for pan culture. Catheter secured with  Prolene suture and connected to external pleura vac. Sterile dressing applied. No immediate complication. Patient tolerated the procedure well. IMPRESSION: Successful CT-guided 10 French right chest tube insertion. Electronically Signed   By: Jerilynn Mages.  Shick M.D.   On: 07/05/2019 15:44   US THORACENTESIS ASP PLEURAL SPACE W/IMG GUIDE  Result Date: 07/04/2019 CLINICAL DATA:  Loculated right pleural effusion. EXAM: ULTRASOUND GUIDED RIGHT THORACENTESIS COMPARISON:  CT of the chest on 06/29/2019 PROCEDURE: An ultrasound guided thoracentesis was thoroughly discussed with the patient and questions answered. The benefits, risks, alternatives and complications were also discussed. The patient understands and wishes to proceed with the procedure. Written consent was obtained. Ultrasound was performed to localize and mark an adequate pocket of fluid in the right chest. The area was then prepped and draped in the normal sterile fashion. 1% Lidocaine was used for local anesthesia. Under ultrasound guidance a 6 French Safe-T-Centesis catheter was introduced. Fluid was removed from the catheter. The catheter was then removed. Under ultrasound guidance, a 5 [place] Yueh centesis catheter was then introduced in a slightly higher pocket of pleural fluid in the right posterior chest. Aspiration was performed through the centesis catheter. The catheter was removed and a dressing applied. FINDINGS: Ultrasound shows a very complex appearing and loculated right pleural effusion with relatively small components posteriorly. From the first pocket sampled in the right posterior pleural space towards the lung base, only 10 mL of clear appearing amber fluid was able to be aspirated. The sample was sent for various requested labs. Decision was made to sample a second slightly higher pocket yielding 5 mL of grossly purulent whitish green thick fluid. This sample was sent for culture analysis. IMPRESSION: Highly loculated and complex right pleural  effusion with relatively small pockets posteriorly. The first pocket yielded 10 mL of clear appearing amber fluid. The second more superior pocket yielded 5 mL of grossly purulent fluid likely consistent with empyema. Fluid analysis will be performed on both samples. Electronically Signed   By: Aletta Edouard M.D.   On: 07/04/2019 14:24      ASSESSMENT/PLAN   Community acquired Pneumonia  -present on admission - due to gram positive cocci such as streptococcus or staph aureus - patient has had numerous courses of antibiotics on outpatient basis- higher risk for resistant bacteria -s/p thoracentesis with - GPC + - appreciate Infectious disease and pharmD-vanco dcd -procalcitonin trending -MRSA nasal PCR  -bronchopulmonary hygiene with Acapella and IS x10/h -repeat CBC in am 07/09/19 -procalcitonin <.10 -Recruitment maneuvers for atelecatatic right lung - metaneb q4h    Empyema of right pleural space  - +GPCs - s/p 64F pigtail cathter -fluid studies resent today and recultured -CT surgery on case Dr Genevive Bi - intrapleural tPA currently- appreciate input    Thank you for allowing me to participate in the care of this patient.  .   Patient/Family are satisfied  with care plan and all questions have been answered.  This document was prepared using Dragon voice recognition software and may include unintentional dictation errors.     Ottie Glazier, M.D.  Division of [facility]

## 2019-07-10 NOTE — Progress Notes (Signed)
Patient clinically stable post CT placement per Dr Anselm Pancoast, 54fr ct placed, patient tolerated well, vitals stable throughout,awake/alert and oriented post procedure. Received Versed 1mg  along with Fentanyl 15mcg IV for procedure. Dressing dry and intact. CT to 20cm H20 suction. Report given to [facility] on 2A with questions answered.

## 2019-07-10 NOTE — Progress Notes (Signed)
PROGRESS NOTE    Andrew Myers  [id number] DOB: 31-Aug-1945 DOA: 07/05/2019 PCP: Derinda Late, MD    Brief Narrative:  Andrew Myers is a 74 y.o. male with medical history significant of recent pna, htn, hyperglycemia, obesity, sleep apnea sent to ED by pulmonology for treatment of empyema.  Patient reported in March of this year he was treated for LLL infiltrate.  He completed 3 rounds antibiotics and steroids. By 4/29 he remained symptomatic.  Associated symptoms include intermittent fevers with max temp 99.9, intermittent right chest stabbing pain worse with deep breath some mild shortness of breath and general malaise.  He denies headache dizziness syncope or near syncope.  He denies chest pain palpitations lower extremity edema.  He denies abdominal pain nausea vomiting diarrhea constipation melena bright red blood per rectum.  He denies any dysuria hematuria frequency or urgency.  CT chest revealed pleural effusion. Underwent thoracentesis 07/04/19 yielding 10ml fluid. Fluid studies revealed gram +bacteria concerning for empyema. Patient was called at home and instructed to come to ED for admission.    Consultants:   ID, pulmonary, CT surgery  Procedures: Chest tube  Antimicrobials:   Zosyn Vanco   Subjective:   Denies any shortness of breath, fever or chills  Objective: Vitals:   07/09/19 1939 07/10/19 0408 07/10/19 0418 07/10/19 0731  BP: (!) 151/59 127/65  135/61  Pulse: 75 75  66  Resp: 20 (!) 26 (!) 22 (!) 22  Temp: 98.1 F (36.7 C) 98.8 F (37.1 C)  98.5 F (36.9 C)  TempSrc: Oral Oral  Axillary  SpO2: 95%   95%  Weight:  99.3 kg    Height:        Intake/Output Summary (Last 24 hours) at 07/10/2019 0808 Last data filed at 07/10/2019 [id number] Gross per 24 hour  Intake 1895.33 ml  Output 2400 ml  Net -504.67 ml   Filed Weights   07/08/19 0428 07/09/19 0449 07/10/19 0408  Weight: 98.2 kg 98.4 kg 99.3 kg    Examination:  General exam:  Appears calm and comfortable, NAD Respiratory system: Clear to auscultation. Respiratory effort normal.no wheezing or rhonchi Right chest tube  Without any drainage Cardiovascular system: S1 & S2 heard, RRR. No JVD, murmurs, rubs, gallops or clicks.  Gastrointestinal system: Abdomen is nondistended, soft and nontender. Normal bowel sounds heard.  No rebound Central nervous system: Alert and oriented.  Grossly intact Extremities: No edema  Skin: Warm dry Psychiatry: Judgement and insight appear normal. Mood & affect appropriate.     Data Reviewed: I have personally reviewed following labs and imaging studies  CBC: Recent Labs  Lab 07/05/19 1036 07/06/19 0502 07/07/19 0438 07/09/19 0616 07/10/19 0622  WBC 11.5* 10.3 12.3* 10.6* 8.2  NEUTROABS 8.5*  --   --  7.9*  --   HGB 13.4 12.2* 12.1* 11.5* 10.9*  HCT 39.5 36.0* 37.0* 35.4* 31.9*  MCV 84.4 83.7 86.7 86.1 83.3  PLT 308 257 261 221 [id number]   Basic Metabolic Panel: Recent Labs  Lab 07/05/19 1036 07/06/19 0502 07/07/19 0438 07/10/19 0622  NA 135 137 138 138  K 3.4* 3.5 3.8 3.4*  CL 97* 103 104 105  CO2 26 26 25 25   GLUCOSE 150* 123* 152* 127*  BUN 10 10 8 10   CREATININE 0.60* 0.59* 0.65 0.49*  CALCIUM 9.1 8.5* 8.5* 8.4*   GFR: Estimated Creatinine Clearance: 100.2 mL/min (A) (by C-G formula based on SCr of 0.49 mg/dL (L)). Liver Function Tests: Recent Labs  Lab  07/05/19 1036  AST 24  ALT 30  ALKPHOS 57  BILITOT 0.8  PROT 8.1  ALBUMIN 3.5   No results for input(s): LIPASE, AMYLASE in the last 168 hours. No results for input(s): AMMONIA in the last 168 hours. Coagulation Profile: No results for input(s): INR, PROTIME in the last 168 hours. Cardiac Enzymes: No results for input(s): CKTOTAL, CKMB, CKMBINDEX, TROPONINI in the last 168 hours. BNP (last 3 results) No results for input(s): PROBNP in the last 8760 hours. HbA1C: No results for input(s): HGBA1C in the last 72 hours. CBG: No results for input(s):  GLUCAP in the last 168 hours. Lipid Profile: No results for input(s): CHOL, HDL, LDLCALC, TRIG, CHOLHDL, LDLDIRECT in the last 72 hours. Thyroid Function Tests: No results for input(s): TSH, T4TOTAL, FREET4, T3FREE, THYROIDAB in the last 72 hours. Anemia Panel: No results for input(s): VITAMINB12, FOLATE, FERRITIN, TIBC, IRON, RETICCTPCT in the last 72 hours. Sepsis Labs: Recent Labs  Lab 07/05/19 1036 07/06/19 0502 07/07/19 0438  PROCALCITON <0.10 <0.10 <0.10  LATICACIDVEN 1.9  --   --     Recent Results (from the past 240 hour(s))  SARS Coronavirus 2 by RT PCR (hospital order, performed in [facility] hospital lab) Nasopharyngeal Nasopharyngeal Swab     Status: None   Collection Time: 07/04/19 10:41 AM   Specimen: Nasopharyngeal Swab  Result Value Ref Range Status   SARS Coronavirus 2 NEGATIVE NEGATIVE Final    Comment: (NOTE) SARS-CoV-2 target nucleic acids are NOT DETECTED. The SARS-CoV-2 RNA is generally detectable in upper and lower respiratory specimens during the acute phase of infection. The lowest concentration of SARS-CoV-2 viral copies this assay can detect is 250 copies / mL. A negative result does not preclude SARS-CoV-2 infection and should not be used as the sole basis for treatment or other patient management decisions.  A negative result may occur with improper specimen collection / handling, submission of specimen other than nasopharyngeal swab, presence of viral mutation(s) within the areas targeted by this assay, and inadequate number of viral copies (<250 copies / mL). A negative result must be combined with clinical observations, patient history, and epidemiological information. Fact Sheet for Patients:   StrictlyIdeas.no Fact Sheet for Healthcare Providers: BankingDealers.co.za This test is not yet approved or cleared  by the [place] FDA and has been authorized for detection and/or diagnosis of  SARS-CoV-2 by FDA under an Emergency Use Authorization (EUA).  This EUA will remain in effect (meaning this test can be used) for the duration of the COVID-19 declaration under Section 564(b)(1) of the Act, 21 U.S.C. section 360bbb-3(b)(1), unless the authorization is terminated or revoked sooner. Performed at [facility], [address] [place], [place] [address]   Body fluid culture     Status: None   Collection Time: 07/04/19  1:00 PM   Specimen: PATH Cytology Pleural fluid  Result Value Ref Range Status   Specimen Description   Final    PLEURAL Performed at [facility], [address], [place], [place] [address]    Special Requests   Final    NONE Performed at [facility], [address] [place], [place] [address]    Gram Stain   Final    FEW WBC PRESENT,BOTH PMN AND MONONUCLEAR FEW GRAM POSITIVE COCCI    Culture   Final    NO GROWTH 3 DAYS Performed at [facility], [place] [address], [place], [place] [address]    Report Status 07/08/2019 FINAL  Final  Acid Fast Smear (AFB)     Status: None   Collection Time: 07/04/19  1:00 PM   Specimen: PATH Cytology Pleural fluid  Result Value Ref Range Status   AFB Specimen Processing Concentration  Final   Acid Fast Smear Negative  Final    Comment: (NOTE) Performed At: [facility] [address] [place], [place] [id number] Rush Farmer MD [id number]    Source (AFB) PLEURAL  Final    Comment: Performed at [facility], [address] [place], [place] [address]  Blood culture (routine x 2)     Status: None   Collection Time: 07/05/19  1:14 PM   Specimen: BLOOD  Result Value Ref Range Status   Specimen Description BLOOD LEFT ANTECUBITAL  Final   Special Requests   Final    BOTTLES DRAWN AEROBIC AND ANAEROBIC Blood Culture results may not be optimal due to an excessive volume of blood received in culture bottles   Culture   Final    NO GROWTH 5 DAYS Performed at  [facility], [address] [place], [place] [address]    Report Status 07/10/2019 FINAL  Final  Blood culture (routine x 2)     Status: None   Collection Time: 07/05/19  1:14 PM   Specimen: BLOOD  Result Value Ref Range Status   Specimen Description BLOOD RIGHT ANTECUBITAL  Final   Special Requests   Final    BOTTLES DRAWN AEROBIC AND ANAEROBIC Blood Culture results may not be optimal due to an inadequate volume of blood received in culture bottles   Culture   Final    NO GROWTH 5 DAYS Performed at [facility], [address] [place], [place] [address]    Report Status 07/10/2019 FINAL  Final  Aerobic/Anaerobic Culture (surgical/deep wound)     Status: None (Preliminary result)   Collection Time: 07/05/19  3:45 PM   Specimen: Fluid; Abscess  Result Value Ref Range Status   Specimen Description FLUID RIGHT PLEURAL  Final   Special Requests NONE  Final   Gram Stain   Final    RARE WBC PRESENT,BOTH PMN AND MONONUCLEAR NO ORGANISMS SEEN    Culture   Final    NO GROWTH 4 DAYS NO ANAEROBES ISOLATED; CULTURE IN PROGRESS FOR 5 DAYS Performed at [facility], [place] [address], [place], [place] [address]    Report Status PENDING  Incomplete  Acid Fast Smear (AFB)     Status: None   Collection Time: 07/05/19  3:45 PM   Specimen: Pleural, Right  Result Value Ref Range Status   AFB Specimen Processing Concentration  Final   Acid Fast Smear Negative  Final    Comment: (NOTE) Performed At: [facility] [address] [place], [place] [id number] Rush Farmer MD [id number]    Source (AFB) LUNG  Final    Comment: RIGHT Performed at [facility], [address] [place], [place] [address]   MRSA PCR Screening     Status: None   Collection Time: 07/05/19 11:05 PM  Result Value Ref Range Status   MRSA by PCR NEGATIVE NEGATIVE Final    Comment:        The GeneXpert MRSA Assay (FDA approved for NASAL specimens only), is one component of  a comprehensive MRSA colonization surveillance program. It is not intended to diagnose MRSA infection nor to guide or monitor treatment for MRSA infections. Performed at [facility], [place], [place]  [address]          Radiology Studies: [facility] Chest Port 1 View  Result Date: 07/09/2019 CLINICAL DATA:  Empyema, RIGHT-sided chest tube in place. EXAM: PORTABLE CHEST 1 VIEW COMPARISON:  Chest x-ray dated 07/07/2019. FINDINGS: RIGHT basilar chest tube appears stable in position. The overlying RIGHT pleural fluid is stable in extent. LEFT lung remains clear. No pneumothorax is seen. Heart size and mediastinal contours are stable. IMPRESSION: Stable chest x-ray. RIGHT-sided chest tube is stable in position. The overlying RIGHT pleural fluid is stable in extent. Electronically Signed   By: Franki Cabot M.D.   On: 07/09/2019 10:10        Scheduled Meds:  amLODipine  5 mg Oral Daily   aspirin EC  162 mg Oral Daily   atorvastatin  20 mg Oral Daily   hydrochlorothiazide  25 mg Oral Daily   irbesartan  37.5 mg Oral Daily   loratadine  10 mg Oral Daily   nebivolol  5 mg Oral Daily   tamsulosin  0.8 mg Oral QPC supper   Continuous Infusions:  sodium chloride 100 mL/hr at 07/10/19 0200   ampicillin-sulbactam (UNASYN) IV 3 g (07/10/19 0523)    Assessment & Plan:   Principal Problem:   Empyema ([place]) Active Problems:   Hypertension   Hyperlipidemia   BPH (benign prostatic hyperplasia)   Hypokalemia   Obesity   Sleep apnea   Hyperglycemia   Empyema of right pleural space ([place])   #1.Rt  Empyema.  Patient underwent a thoracentesis May 11 and fluid studies positive for gram-positive bacteria.  He was instructed to come to the emergency department for admission/treatment. -Continue IV antibiotics. Will place patient on Vancomycin and Zosyn per pharmacy dosing -Follow blood cultures -Gentle IV fluids -Supportive therapy -Chest tube placed, s/p  intrapleural thrombolytics (TPA) on 5/13 CTS ordered CT yesterday. Per surgery TPA v.s. pigtail by IR.. ID input was appreciated, d/c vanco since MRSA negative.  cotninue zosyn for now Plan: IR for placement of second larger bore chest tube to facilitate drainage/further tPA as well    #2.  Hypokalemia.  Mild.  Likely related to diuretic  K today 3.4 Replace, and monitor   #3.  Hypertension.  Stable in the emergency department.  Home medications include amlodipine, hydrochlorothiazide, Bystolic, valsartan. -Resumed home meds -Monitor  #4.  Sleep apnea.  Patient reports he uses a home BiPAP. -Respiratory consult for device  #5.  BPH.  Stable at baseline. -Continue home meds  #6.  Obesity.  BMI 32. -nutritional consult  #7.  Hyperlipidemia.   Continue statins  #8.  Hyperglycemia. No hx of same. Likely related to recent  steroid use.  -   #9. Pulmonary Hypertension Pt reports not having this . Takes Sildenafil for ED , not tid.  It has been changed   DVT prophylaxis: SCD Code Status: Full Family Communication: None at bedside Disposition Plan: Back to previous home life Barrier: Has chest tube in place needs further surgical and medical management, currently not medically stable for discharge, also requires iv abx and TPA in am. Having second chest tube to be placed       LOS: 5 days   Time spent: 45 minutes with more than 50% COC    Nolberto Hanlon, MD [facility] Pager [phone]  If 7PM-7AM, please contact night-coverage [email or web address] Password [facility] 07/10/2019, 8:08 AM Patient ID: Vitaliy Dingle, male   DOB: 02-17-1946, 74 y.o.   MRN: [id number] Patient ID: [name],  male   DOB: May 02, 1945, 74 y.o.   MRN: [id number]

## 2019-07-10 NOTE — Care Management Important Message (Signed)
Important Message  Patient Details  Name: Andrew Myers MRN: [id number] Date of Birth: Apr 24, 1945   Medicare Important Message Given:  Yes     Dannette Barbara 07/10/2019, 12:31 PM

## 2019-07-10 NOTE — H&P (Signed)
Chief Complaint: Patient was seen in consultation today for right chest tube placement/possible exchange of existing right chest tube.  Referring Physician(s): Dr. Genevive Bi  Supervising Physician: Markus Daft  Patient Status: [facility] - In-pt  History of Present Illness: Andrew Myers is a 74 y.o. male with a past medical history significant for BPH, sleep apnea, HTN, HLD, recurrent pneumonia, previous pleural effusion and right sided empyema who presented to the ED on 07/05/19 after undergoing thoracentesis as an outpatient with pulmonology and subsequent fluid studies showed acute infection with gram-positive bacteria. He was admitted for further evaluation and management. He had a right sided 10 Fr chest tube placed in IR later that day which has been followed by CT surgery. There has been low output from there chest tube and ntrapleural tPA was instilled however this did not improve output. IR has been asked to place a second larger bore chest tube to attempt further drainage/possible tPA dwell.  Mr. Tawil is sitting up in bed watching TV, no staff or family at bedside. He states he has been feeling pretty good, much better than when he came in. He does not have any dyspnea at rest but he quickly gets short of breath with just a little bit of walking. He has not had any pain or leakage from the existing chest tube. He has not had anything to eat or drink today besides a few sips of water with his medication. Discussed second chest tube placement/possible exchange of existing chest tube today and he is agreeable to proceed.    Past Medical History:  Diagnosis Date  . BPH (benign prostatic hyperplasia)   . CAP (community acquired pneumonia) 04/2019  . Empyema lung ([place]) 06/2019  . Hyperlipidemia   . Hypertension   . Obesity   . Pleural effusion on right 05/2019  . Skin cancer    BCC, SCC  . Sleep apnea   . Sleep apnea     Past Surgical History:  Procedure Laterality Date  .  APPENDECTOMY    . CATARACT EXTRACTION    . FRACTURE SURGERY      Allergies: Codeine  Medications: Prior to Admission medications   Medication Sig Start Date End Date Taking? Authorizing Provider  amLODipine (NORVASC) 5 MG tablet Take 5 mg by mouth daily.   Yes [provider]  aspirin EC 81 MG tablet Take 81 mg by mouth 2 (two) times daily.    Yes [provider]  atorvastatin (LIPITOR) 20 MG tablet Take 20 mg by mouth at bedtime.    Yes [provider]  cetirizine (ZYRTEC) 10 MG tablet Take 10 mg by mouth daily.   Yes [provider]  hydrochlorothiazide (HYDRODIURIL) 25 MG tablet Take 25 mg by mouth daily. 08/18/18  Yes [provider]  Multiple Vitamin (MULTI-VITAMIN) tablet Take 1 tablet by mouth daily.   Yes [provider]  nebivolol (BYSTOLIC) 5 MG tablet Take 5 mg by mouth daily.   Yes [provider]  tamsulosin (FLOMAX) 0.4 MG CAPS capsule Take 0.8 mg by mouth daily after supper.    Yes [provider]  valsartan (DIOVAN) 160 MG tablet Take 160 mg by mouth daily.  10/27/18 10/27/19 Yes [provider]  sildenafil (REVATIO) 20 MG tablet Take 20-100 mg by mouth daily as needed (ED).     [provider]  losartan (COZAAR) 100 MG tablet Take 100 mg by mouth daily. 09/05/18 01/05/19  [provider]  losartan-hydrochlorothiazide (HYZAAR) 100-12.5 MG tablet  Take 1 tablet by mouth daily.  10/03/18  [provider]  Simethicone (PHAZYME MAXIMUM STRENGTH) 250 MG CAPS Take 1 tablet by mouth 2 (two) times daily as needed. 01/05/19 05/11/19  Karen Kitchens, NP     Family History  Problem Relation Age of Onset  . Breast cancer Mother   . Hypertension Mother   . Hyperlipidemia Mother   . Heart failure Mother   . Heart attack Father   . Thyroid cancer Sister     Social History   Socioeconomic History  . Marital status: Married    Spouse name: Not on file  . Number of children: Not on  file  . Years of education: Not on file  . Highest education level: Not on file  Occupational History  . Not on file  Tobacco Use  . Smoking status: Former Smoker    Quit date: 05/07/1967    Years since quitting: 52.2  . Smokeless tobacco: Never Used  Substance and Sexual Activity  . Alcohol use: Yes    Comment: rarely  . Drug use: No  . Sexual activity: Not on file  Other Topics Concern  . Not on file  Social History Narrative  . Not on file   Social Determinants of Health   Financial Resource Strain:   . Difficulty of Paying Living Expenses:   Food Insecurity:   . Worried About Charity fundraiser in the Last Year:   . Arboriculturist in the Last Year:   Transportation Needs:   . Film/video editor (Medical):   Marland Kitchen Lack of Transportation (Non-Medical):   Physical Activity:   . Days of Exercise per Week:   . Minutes of Exercise per Session:   Stress:   . Feeling of Stress :   Social Connections:   . Frequency of Communication with Friends and Family:   . Frequency of Social Gatherings with Friends and Family:   . Attends Religious Services:   . Active Member of Clubs or Organizations:   . Attends Archivist Meetings:   Marland Kitchen Marital Status:      Review of Systems: A 12 point ROS discussed and pertinent positives are indicated in the HPI above.  All other systems are negative.  Review of Systems  Constitutional: Negative for chills and fever.  Respiratory: Positive for shortness of breath (with exertion; none at rest). Negative for cough.   Cardiovascular: Negative for chest pain.  Gastrointestinal: Negative for abdominal pain, diarrhea, nausea and vomiting.  Neurological: Negative for dizziness and headaches.    Vital Signs: BP 135/61 (BP Location: Right Arm)   Pulse 76   Temp 98.5 F (36.9 C) (Axillary)   Resp 18   Ht 5\' 11"  (1.803 m)   Wt 218 lb 14.4 oz (99.3 kg)   SpO2 100%   BMI 30.53 kg/m   Physical Exam Vitals and nursing note  reviewed.  Constitutional:      General: He is not in acute distress. HENT:     Head: Normocephalic.     Mouth/Throat:     Mouth: Mucous membranes are moist.     Pharynx: Oropharynx is clear. No oropharyngeal exudate or posterior oropharyngeal erythema.  Cardiovascular:     Rate and Rhythm: Normal rate and regular rhythm.  Pulmonary:     Effort: Pulmonary effort is normal.     Comments: (+) decreased breath sounds on the right (+) right chest tube to pleurvac with ~10 cc if serosanguineous output. No  air leak. No SQ emphysema. Insertion site unremarkable Abdominal:     Palpations: Abdomen is soft.  Skin:    General: Skin is warm and dry.  Neurological:     Mental Status: He is alert and oriented to person, place, and time.  Psychiatric:        Mood and Affect: Mood normal.        Behavior: Behavior normal.        Thought Content: Thought content normal.        Judgment: Judgment normal.      MD Evaluation Airway: WNL Heart: WNL Abdomen: WNL Chest/ Lungs: WNL ASA  Classification: 3 Mallampati/Airway Score: Two   Imaging: DG Chest 1 View  Result Date: 07/04/2019 CLINICAL DATA:  Status post right thoracentesis. EXAM: CHEST  1 VIEW COMPARISON:  CT of the chest on 06/29/2019 FINDINGS: The heart size and mediastinal contours are within normal limits. Residual lateral/posterolateral component of loculated pleural fluid remains on the right. No pneumothorax after right thoracentesis. No pulmonary edema. The visualized skeletal structures are unremarkable. IMPRESSION: No pneumothorax after right sided thoracentesis. Residual lateral/posterolateral component of loculated right pleural fluid remains. Electronically Signed   By: Aletta Edouard M.D.   On: 07/04/2019 14:26   CT CHEST WO CONTRAST  Result Date: 07/07/2019 CLINICAL DATA:  Follow-up scan for empyema of the RIGHT lung, chest tube placed 2 days ago EXAM: CT CHEST WITHOUT CONTRAST TECHNIQUE: Multidetector CT imaging of the  chest was performed following the standard protocol without IV contrast. COMPARISON:  06/28/2009 and intervening interventional and ultrasound evaluations. FINDINGS: Cardiovascular: Calcified atheromatous plaque in the thoracic aorta similar to prior study. No aneurysmal dilation. Heart size is stable without pericardial effusion. Calcified coronary artery disease as before. Vessels not well assessed given lack of intravenous contrast. Mediastinum/Nodes: No thoracic inlet adenopathy. No axillary lymphadenopathy. No mediastinal adenopathy. Esophagus grossly normal. Lungs/Pleura: Since the previous examination there is been interval placement of a RIGHT-sided chest tube. A small amount of subcutaneous emphysema is noted. The marker for chest tube sideholes is at least 1.5 cm within the RIGHT chest cavity within the pleural space. Pleural fluid at the RIGHT lung base is perhaps slightly diminished but there is no change in the loculated fluid along the fissure in the RIGHT chest when compared to the prior exam RIGHT basilar consolidative changes and volume loss are similar to the prior study. LEFT chest is clear. Airways are patent. Upper Abdomen: Upper abdominal contents are unremarkable. No acute process in the upper abdomen. Musculoskeletal: Small amount of subcutaneous emphysema along the RIGHT chest wall as discussed. No chest wall mass. No acute bone process. Spinal degenerative change. IMPRESSION: 1. Slightly diminished RIGHT pleural fluid, dominant loculated area along the major fissure in the RIGHT chest is unchanged. 2. Persistent basilar consolidation/volume loss without change. This may represent rounded atelectasis or pneumonia. Follow-up is suggested to ensure there is no underlying mass. 3. Small amount of subcutaneous emphysema along the chest wall. Marker for sideholes is within the pleural space as discussed above. 4. Atherosclerotic changes in the thoracic aorta. Calcified coronary artery disease. 5.  Aortic atherosclerosis. Aortic Atherosclerosis (ICD10-I70.0). Electronically Signed   By: Zetta Bills M.D.   On: 07/07/2019 16:33   CT CHEST WO CONTRAST  Result Date: 06/29/2019 CLINICAL DATA:  Pleural effusion, shortness of breath. Follow-up chest x-ray EXAM: CT CHEST WITHOUT CONTRAST TECHNIQUE: Multidetector CT imaging of the chest was performed following the standard protocol without IV contrast. COMPARISON:  Chest x-ray 05/11/2019  FINDINGS: Cardiovascular: Heart is normal size. Aorta normal caliber. Scattered coronary artery and aortic calcifications. Mediastinum/Nodes: No mediastinal, hilar, or axillary adenopathy. Trachea and esophagus are unremarkable. Thyroid unremarkable. Lungs/Pleura: Partially loculated moderate-sized right pleural effusion noted. Compressive atelectasis or pneumonia in the right lower lobe. Scattered granulomas in the right lung. Left lung clear. Upper Abdomen: Imaging into the upper abdomen shows no acute findings. Musculoskeletal: Chest wall soft tissues are unremarkable. No acute bony abnormality. IMPRESSION: Moderate partially loculated right pleural effusion. Right lower lobe compressive atelectasis versus pneumonia. Electronically Signed   By: Rolm Baptise M.D.   On: 06/29/2019 14:56   DG Chest Port 1 View  Result Date: 07/10/2019 CLINICAL DATA:  Atelectasis EXAM: PORTABLE CHEST 1 VIEW COMPARISON:  Portable exam at 0743 hrs compared to 07/09/2019 FINDINGS: Upper-normal size of cardiac silhouette. Mediastinal contours and pulmonary vascularity normal. Persistent RIGHT pleural effusion and basilar atelectasis. LEFT lung clear. No acute infiltrate or pneumothorax. Endplate spur formation thoracic spine, mild. IMPRESSION: Persistent RIGHT pleural effusion and basilar atelectasis. Electronically Signed   By: Lavonia Dana M.D.   On: 07/10/2019 08:20   DG Chest Port 1 View  Result Date: 07/09/2019 CLINICAL DATA:  Empyema, RIGHT-sided chest tube in place. EXAM: PORTABLE CHEST  1 VIEW COMPARISON:  Chest x-ray dated 07/07/2019. FINDINGS: RIGHT basilar chest tube appears stable in position. The overlying RIGHT pleural fluid is stable in extent. LEFT lung remains clear. No pneumothorax is seen. Heart size and mediastinal contours are stable. IMPRESSION: Stable chest x-ray. RIGHT-sided chest tube is stable in position. The overlying RIGHT pleural fluid is stable in extent. Electronically Signed   By: Franki Cabot M.D.   On: 07/09/2019 10:10   DG Chest Port 1 View  Result Date: 07/07/2019 CLINICAL DATA:  Follow-up right chest tube. EXAM: PORTABLE CHEST 1 VIEW COMPARISON:  07/06/2018 FINDINGS: Chest tube remains in place at the right lung base. There is slightly less pleural fluid than was seen yesterday. No pleural air is visible. Some pleural fluid does persist, with right lower lung atelectasis. Left hemithorax appears clear. IMPRESSION: Persistent right chest tube. Slightly less right pleural fluid and less pleural air. Moderate amount of pleural fluid does persist with lower right lung atelectasis. Electronically Signed   By: Nelson Chimes M.D.   On: 07/07/2019 11:55   DG Chest Port 1 View  Result Date: 07/06/2019 CLINICAL DATA:  Right empyema. EXAM: PORTABLE CHEST 1 VIEW COMPARISON:  Chest x-ray dated Jul 04, 2019. FINDINGS: New right pigtail chest tube with unchanged small loculated pleural effusion. The left lung is clear. No pneumothorax. The heart size and mediastinal contours are within normal limits. Normal pulmonary vascularity. No acute osseous abnormality. IMPRESSION: 1. New right-sided chest tube with unchanged small loculated pleural effusion. No pneumothorax. Electronically Signed   By: Titus Dubin M.D.   On: 07/06/2019 09:17   CT IMAGE GUIDED DRAINAGE BY PERCUTANEOUS CATHETER  Result Date: 07/05/2019 INDICATION: Right empyema EXAM: CT-GUIDED PLACEMENT RIGHT 10 FRENCH CHEST TUBE MEDICATIONS: The patient is currently admitted to the hospital and receiving  intravenous antibiotics. The antibiotics were administered within an appropriate time frame prior to the initiation of the procedure. ANESTHESIA/SEDATION: Fentanyl 50 mcg IV; Versed 1.0 mg IV Moderate Sedation Time:  11 MINUTES The patient was continuously monitored during the procedure by the interventional radiology nurse under my direct supervision. COMPLICATIONS: None immediate. PROCEDURE: Informed written consent was obtained from the patient after a thorough discussion of the procedural risks, benefits and alternatives. All questions were  addressed. Maximal Sterile Barrier Technique was utilized including caps, mask, sterile gowns, sterile gloves, sterile drape, hand hygiene and skin antiseptic. A timeout was performed prior to the initiation of the procedure. Previous imaging reviewed. Patient positioned right anterior oblique. Noncontrast localization CT performed. The loculated right effusion was localized and marked for a lateral posterior approach. Under sterile conditions and local anesthesia, 18 gauge introducer needle was advanced from a posterolateral lower intercostal approach into the pleural fluid. Needle position confirmed with CT. Guidewire inserted followed by tract dilatation insert a 10 French drain. Drain catheter position confirmed with CT. Syringe aspiration yielded 10 cc of thick mucinous exudative fluid. Sample sent for pan culture. Catheter secured with Prolene suture and connected to external pleura vac. Sterile dressing applied. No immediate complication. Patient tolerated the procedure well. IMPRESSION: Successful CT-guided 10 French right chest tube insertion. Electronically Signed   By: Jerilynn Mages.  Shick M.D.   On: 07/05/2019 15:44   US THORACENTESIS ASP PLEURAL SPACE W/IMG GUIDE  Result Date: 07/04/2019 CLINICAL DATA:  Loculated right pleural effusion. EXAM: ULTRASOUND GUIDED RIGHT THORACENTESIS COMPARISON:  CT of the chest on 06/29/2019 PROCEDURE: An ultrasound guided thoracentesis  was thoroughly discussed with the patient and questions answered. The benefits, risks, alternatives and complications were also discussed. The patient understands and wishes to proceed with the procedure. Written consent was obtained. Ultrasound was performed to localize and mark an adequate pocket of fluid in the right chest. The area was then prepped and draped in the normal sterile fashion. 1% Lidocaine was used for local anesthesia. Under ultrasound guidance a 6 French Safe-T-Centesis catheter was introduced. Fluid was removed from the catheter. The catheter was then removed. Under ultrasound guidance, a 5 [place] Yueh centesis catheter was then introduced in a slightly higher pocket of pleural fluid in the right posterior chest. Aspiration was performed through the centesis catheter. The catheter was removed and a dressing applied. FINDINGS: Ultrasound shows a very complex appearing and loculated right pleural effusion with relatively small components posteriorly. From the first pocket sampled in the right posterior pleural space towards the lung base, only 10 mL of clear appearing amber fluid was able to be aspirated. The sample was sent for various requested labs. Decision was made to sample a second slightly higher pocket yielding 5 mL of grossly purulent whitish green thick fluid. This sample was sent for culture analysis. IMPRESSION: Highly loculated and complex right pleural effusion with relatively small pockets posteriorly. The first pocket yielded 10 mL of clear appearing amber fluid. The second more superior pocket yielded 5 mL of grossly purulent fluid likely consistent with empyema. Fluid analysis will be performed on both samples. Electronically Signed   By: Aletta Edouard M.D.   On: 07/04/2019 14:24    Labs:  CBC: Recent Labs    07/06/19 0502 07/07/19 0438 07/09/19 0616 07/10/19 0622  WBC 10.3 12.3* 10.6* 8.2  HGB 12.2* 12.1* 11.5* 10.9*  HCT 36.0* 37.0* 35.4* 31.9*  PLT 257 261 221  203    COAGS: No results for input(s): INR, APTT in the last 8760 hours.  BMP: Recent Labs    07/05/19 1036 07/06/19 0502 07/07/19 0438 07/10/19 0622  NA 135 137 138 138  K 3.4* 3.5 3.8 3.4*  CL 97* 103 104 105  CO2 26 26 25 25   GLUCOSE 150* 123* 152* 127*  BUN 10 10 8 10   CALCIUM 9.1 8.5* 8.5* 8.4*  CREATININE 0.60* 0.59* 0.65 0.49*  GFRNONAA >60 >60 >60 >60  GFRAA >  60 >60 >60 >60    LIVER FUNCTION TESTS: Recent Labs    07/05/19 1036  BILITOT 0.8  AST 24  ALT 30  ALKPHOS 57  PROT 8.1  ALBUMIN 3.5    TUMOR MARKERS: No results for input(s): AFPTM, CEA, CA199, CHROMGRNA in the last 8760 hours.  Assessment and Plan:  74 y/o M with right empyema s/p 10 Fr chest tube placement 5/12 in IR that has been followed by CT surgery since placement. He has undergone intrapleural tPA due to poor drainage however this did not improve drainage and IR has been asked to place a second larger bore chest tube to facilitate drainage/further tPA dwell. Patient imaging and history reviewed by Dr. Anselm Pancoast who agrees to procedure today.  Patient has been NPO since at least midnight per his account except for a few sips of with medications, not currently receiving anticoagulation/antiplatelet therapy. Afebrile, WBC 8.2, hgb 10.9, plt 203.  Risks and benefits discussed with the patient including bleeding, infection, damage to adjacent structures, malfunction of the catheter with need for additional procedures.  All of the patient's questions were answered, patient is agreeable to proceed.  Consent signed and in chart.  Thank you for this interesting consult.  I greatly enjoyed meeting Johne Traverse and look forward to participating in their care.  A copy of this report was sent to the requesting provider on this date.  Electronically Signed: Joaquim Nam, PA-C 07/10/2019, 9:40 AM   I spent a total of 40 Minutes  in face to face in clinical consultation, greater than 50% of  which was counseling/coordinating care for new right chest tube placement and possible exchange of existing chest tube.

## 2019-07-11 ENCOUNTER — Inpatient Hospital Stay: Payer: PPO

## 2019-07-11 LAB — POTASSIUM: Potassium: 3.6 mmol/L (ref 3.5–5.1)

## 2019-07-11 MED ORDER — SODIUM CHLORIDE (PF) 0.9 % IJ SOLN
Freq: Once | INTRAMUSCULAR | Status: DC
  Filled 2019-07-11: qty 10

## 2019-07-11 NOTE — Evaluation (Signed)
Physical Therapy Evaluation Patient Details Name: Andrew Myers MRN: [id number] DOB: 10/09/1945 Today's Date: 07/11/2019   History of Present Illness  Pt is a 74 y.o. male presenting to hospital 5/12 (sent to ED by pulmonologist for empyema; s/p thoracentesis 5/11 showing evidence of acute infection and gram-positive bacteria).  Pt admitted with empyema, hypokalemia, htn, and community acquired PNA.  Pt has had 2 chest tubes placed during hospitalization.  PMH includes h/o recurrent PNA since March, BPH, CAP, htn, skin CA, sleep apnea, fx surgery R wrist.  Clinical Impression  Prior to hospital admission, pt was independent; lives with his sister on main level of home.  Currently pt is modified independent with bed mobility; independent with transfers; and able to ambulate around nursing loop (no UE support/AD) x2 laps.  SOB noted towards end of ambulation; O2 sats 92% or greater on room air throughout therapy session.  Overall pt steady and safe with all functional mobility during therapy session and pt reports he has been ambulating in hallway on his own multiple times a day (1-3 laps at a time depending on how he is feeling)--nurse verified this information.  Anticipate seeing pt 1 more session for independence in HEP to prevent deconditioning and weakness during extended hospitalization.  Upon hospital discharge, no further PT needs anticipated.    Follow Up Recommendations No PT follow up    Equipment Recommendations  None recommended by PT    Recommendations for Other Services       Precautions / Restrictions Precautions Precautions: Fall Precaution Comments: Per PT order: may place chest tube to water seal during ambulation Restrictions Weight Bearing Restrictions: No      Mobility  Bed Mobility Overal bed mobility: Modified Independent             General bed mobility comments: Semi-supine to/from sitting edge of bed without any noted  difficulty  Transfers Overall transfer level: Independent Equipment used: None             General transfer comment: steady safe transfers noted  Ambulation/Gait Ambulation/Gait assistance: Supervision(SBA for chest tube system but not required (pt has been holding chest tube system and walking on own around nursing loop)) Gait Distance (Feet): 360 Feet Assistive device: None Gait Pattern/deviations: WFL(Within Functional Limits) Gait velocity: mildly decreased   General Gait Details: steady safe ambulation noted  Stairs            Wheelchair Mobility    Modified Rankin (Stroke Patients Only)       Balance Overall balance assessment: Modified Independent Sitting-balance support: No upper extremity supported;Feet supported Sitting balance-Leahy Scale: Normal Sitting balance - Comments: steady sitting reaching outside BOS   Standing balance support: No upper extremity supported;During functional activity Standing balance-Leahy Scale: Normal Standing balance comment: steady safe ambulation noted                             Pertinent Vitals/Pain Pain Assessment: 0-10 Pain Score: 1  Pain Location: R scapular/posterior shoulder area Pain Descriptors / Indicators: Other (Comment)("annoying") Pain Intervention(s): Limited activity within patient's tolerance;Monitored during session;Repositioned  HR WFL during sessions activities.    Home Living Family/patient expects to be discharged to:: Private residence Living Arrangements: Other relatives(pt's sister) Available Help at Discharge: Family Type of Home: House Home Access: Stairs to enter   [employer] of Steps: 2 steps to enter with grab bar on R side Home Layout: One level;Laundry or work area in  basement(flight of steps with R railing descending to basement) Home Equipment: Cane - single point;Grab bars - tub/shower;Grab bars - toilet      Prior Function Level of Independence:  Independent               Hand Dominance        Extremity/Trunk Assessment   Upper Extremity Assessment Upper Extremity Assessment: Overall WFL for tasks assessed    Lower Extremity Assessment Lower Extremity Assessment: Generalized weakness    Cervical / Trunk Assessment Cervical / Trunk Assessment: Normal  Communication   Communication: No difficulties  Cognition Arousal/Alertness: Awake/alert Behavior During Therapy: WFL for tasks assessed/performed Overall Cognitive Status: Within Functional Limits for tasks assessed                                        General Comments General comments (skin integrity, edema, etc.): chest tube site appearing intact beginning/end of session.  Nursing cleared pt for participation in physical therapy.  Pt agreeable to PT session.  Nurse reports pt already on water seal.    Exercises  Educated pt on pacing/activity modification based on SOB with ambulation.   Assessment/Plan    PT Assessment Patient needs continued PT services  PT Problem List Decreased strength;Decreased activity tolerance;Decreased mobility;Cardiopulmonary status limiting activity;Pain       PT Treatment Interventions Gait training;Stair training;Functional mobility training;Therapeutic activities;Therapeutic exercise;Balance training;Patient/family education    PT Goals (Current goals can be found in the Care Plan section)  Acute Rehab PT Goals Patient Stated Goal: to go home when medically ready PT Goal Formulation: With patient Time For Goal Achievement: 07/25/19 Potential to Achieve Goals: Good    Frequency Min 2X/week   Barriers to discharge        Co-evaluation               AM-PAC PT "6 Clicks" Mobility  Outcome Measure Help needed turning from your back to your side while in a flat bed without using bedrails?: None Help needed moving from lying on your back to sitting on the side of a flat bed without using  bedrails?: None Help needed moving to and from a bed to a chair (including a wheelchair)?: None Help needed standing up from a chair using your arms (e.g., wheelchair or bedside chair)?: None Help needed to walk in hospital room?: None Help needed climbing 3-5 steps with a railing? : A Little 6 Click Score: 23    End of Session   Activity Tolerance: Patient tolerated treatment well Patient left: in bed;with call bell/phone within reach Nurse Communication: Mobility status;Precautions PT Visit Diagnosis: Muscle weakness (generalized) (M62.81);Other abnormalities of gait and mobility (R26.89)    Time: 1441-1501 PT Time Calculation (min) (ACUTE ONLY): 20 min   Charges:   PT Evaluation $PT Eval Low Complexity: 1 Low         Moataz Tavis, PT 07/11/19, 3:25 PM

## 2019-07-11 NOTE — Progress Notes (Signed)
Pulmonary Medicine          Date: 07/11/2019,   MRN# [id number] Andrew Myers 1945/03/10     AdmissionWeight: 103.4 kg                 CurrentWeight: 98.1 kg   Refering physician:Dr Agbata   CHIEF COMPLAINT:   Pneumonia with Empyema   SUBJECTIVE   -Patient is laying in bed in no distress.   -CT#1 is removed.  CT#2 with amber colored fluid.    -Patient has walked around hallway was able to do 12 laps.  He reports breathing is better then when I initially evaluated him on outpatient.  He is thankful and appreciative for care.   -He is using IS - tidal volume up to 1000cc.  PAST MEDICAL HISTORY   Past Medical History:  Diagnosis Date  . BPH (benign prostatic hyperplasia)   . CAP (community acquired pneumonia) 04/2019  . Empyema lung ([place]) 06/2019  . Hyperlipidemia   . Hypertension   . Obesity   . Pleural effusion on right 05/2019  . Skin cancer    BCC, SCC  . Sleep apnea   . Sleep apnea      SURGICAL HISTORY   Past Surgical History:  Procedure Laterality Date  . APPENDECTOMY    . CATARACT EXTRACTION    . FRACTURE SURGERY       FAMILY HISTORY   Family History  Problem Relation Age of Onset  . Breast cancer Mother   . Hypertension Mother   . Hyperlipidemia Mother   . Heart failure Mother   . Heart attack Father   . Thyroid cancer Sister      SOCIAL HISTORY   Social History   Tobacco Use  . Smoking status: Former Smoker    Quit date: 05/07/1967    Years since quitting: 52.2  . Smokeless tobacco: Never Used  Substance Use Topics  . Alcohol use: Yes    Comment: rarely  . Drug use: No     MEDICATIONS    Home Medication:    Current Medication:  Current Facility-Administered Medications:  .  acetaminophen (TYLENOL) tablet 650 mg, 650 mg, Oral, Q6H PRN, 650 mg at 07/11/19 0505 **OR** acetaminophen (TYLENOL) suppository 650 mg, 650 mg, Rectal, Q6H PRN, Agbata, Tochukwu, MD .  alteplase (tPA) 10mg  in NS 3mL for  Dr.Oaks (intrapleural administration/[facility]), , Intrapleural, Once, Nestor Lewandowsky, MD .  amLODipine (NORVASC) tablet 5 mg, 5 mg, Oral, Daily, Agbata, Tochukwu, MD, 5 mg at 07/11/19 [id number] .  Ampicillin-Sulbactam (UNASYN) 3 g in sodium chloride 0.9 % 100 mL IVPB, 3 g, Intravenous, Q6H, Ravishankar, Jayashree, MD, Last Rate: 200 mL/hr at 07/11/19 1239, 3 g at 07/11/19 1239 .  aspirin EC tablet 162 mg, 162 mg, Oral, Daily, Lu Duffel, RPH, 162 mg at 07/11/19 [id number] .  atorvastatin (LIPITOR) tablet 20 mg, 20 mg, Oral, Daily, Agbata, Tochukwu, MD, 20 mg at 07/10/19 1831 .  hydrochlorothiazide (HYDRODIURIL) tablet 25 mg, 25 mg, Oral, Daily, Agbata, Tochukwu, MD, 25 mg at 07/11/19 [id number] .  irbesartan (AVAPRO) tablet 37.5 mg, 37.5 mg, Oral, Daily, Agbata, Tochukwu, MD, 37.5 mg at 07/11/19 0812 .  loratadine (CLARITIN) tablet 10 mg, 10 mg, Oral, Daily, Agbata, Tochukwu, MD, 10 mg at 07/11/19 0812 .  nebivolol (BYSTOLIC) tablet 5 mg, 5 mg, Oral, Daily, Agbata, Tochukwu, MD, 5 mg at 07/11/19 0812 .  ondansetron (ZOFRAN) tablet 4 mg, 4 mg, Oral, Q6H PRN **OR** ondansetron (ZOFRAN) injection 4  mg, 4 mg, Intravenous, Q6H PRN, Agbata, Tochukwu, MD .  tamsulosin (FLOMAX) capsule 0.8 mg, 0.8 mg, Oral, QPC supper, Agbata, Tochukwu, MD, 0.8 mg at 07/10/19 1831    ALLERGIES   Codeine     REVIEW OF SYSTEMS    Review of Systems:  Gen:  Denies  fever, sweats, chills weigh loss  HEENT: Denies blurred vision, double vision, ear pain, eye pain, hearing loss, nose bleeds, sore throat Cardiac:  No dizziness, chest pain or heaviness, chest tightness,edema Resp:   Denies cough or sputum porduction, shortness of breath,wheezing, hemoptysis,  Gi: Denies swallowing difficulty, stomach pain, nausea or vomiting, diarrhea, constipation, bowel incontinence Gu:  Denies bladder incontinence, burning urine Ext:   Denies Joint pain, stiffness or swelling Skin: Denies  skin rash, easy bruising or bleeding or hives Endoc:   Denies polyuria, polydipsia , polyphagia or weight change Psych:   Denies depression, insomnia or hallucinations   Other:  All other systems negative   VS: BP (!) 141/69 (BP Location: Right Arm)   Pulse 75   Temp 98.7 F (37.1 C) (Oral)   Resp 19   Ht 5\' 11"  (1.803 m)   Wt 98.1 kg   SpO2 96%   BMI 30.17 kg/m      PHYSICAL EXAM    GENERAL:NAD, no fevers, chills, no weakness no fatigue HEAD: Normocephalic, atraumatic.  EYES: Pupils equal, round, reactive to light. Extraocular muscles intact. No scleral icterus.  MOUTH: Moist mucosal membrane. Dentition intact. No abscess noted.  EAR, NOSE, THROAT: Clear without exudates. No external lesions.  NECK: Supple. No thyromegaly. No nodules. No JVD.  PULMONARY: Clear to ausculatation bilaterally with Decreased air entry on right CARDIOVASCULAR: S1 and S2. Regular rate and rhythm. No murmurs, rubs, or gallops. No edema. Pedal pulses 2+ bilaterally.  GASTROINTESTINAL: Soft, nontender, nondistended. No masses. Positive bowel sounds. No hepatosplenomegaly.  MUSCULOSKELETAL: No swelling, clubbing, or edema. Range of motion full in all extremities.  NEUROLOGIC: Cranial nerves II through XII are intact. No gross focal neurological deficits. Sensation intact. Reflexes intact.  SKIN: No ulceration, lesions, rashes, or cyanosis. Skin warm and dry. Turgor intact.  PSYCHIATRIC: Mood, affect within normal limits. The patient is awake, alert and oriented x 3. Insight, judgment intact.       IMAGING    DG Chest 1 View  Result Date: 07/04/2019 CLINICAL DATA:  Status post right thoracentesis. EXAM: CHEST  1 VIEW COMPARISON:  CT of the chest on 06/29/2019 FINDINGS: The heart size and mediastinal contours are within normal limits. Residual lateral/posterolateral component of loculated pleural fluid remains on the right. No pneumothorax after right thoracentesis. No pulmonary edema. The visualized skeletal structures are unremarkable. IMPRESSION: No  pneumothorax after right sided thoracentesis. Residual lateral/posterolateral component of loculated right pleural fluid remains. Electronically Signed   By: Aletta Edouard M.D.   On: 07/04/2019 14:26   CT CHEST WO CONTRAST  Result Date: 07/07/2019 CLINICAL DATA:  Follow-up scan for empyema of the RIGHT lung, chest tube placed 2 days ago EXAM: CT CHEST WITHOUT CONTRAST TECHNIQUE: Multidetector CT imaging of the chest was performed following the standard protocol without IV contrast. COMPARISON:  06/28/2009 and intervening interventional and ultrasound evaluations. FINDINGS: Cardiovascular: Calcified atheromatous plaque in the thoracic aorta similar to prior study. No aneurysmal dilation. Heart size is stable without pericardial effusion. Calcified coronary artery disease as before. Vessels not well assessed given lack of intravenous contrast. Mediastinum/Nodes: No thoracic inlet adenopathy. No axillary lymphadenopathy. No mediastinal adenopathy. Esophagus grossly normal. Lungs/Pleura:  Since the previous examination there is been interval placement of a RIGHT-sided chest tube. A small amount of subcutaneous emphysema is noted. The marker for chest tube sideholes is at least 1.5 cm within the RIGHT chest cavity within the pleural space. Pleural fluid at the RIGHT lung base is perhaps slightly diminished but there is no change in the loculated fluid along the fissure in the RIGHT chest when compared to the prior exam RIGHT basilar consolidative changes and volume loss are similar to the prior study. LEFT chest is clear. Airways are patent. Upper Abdomen: Upper abdominal contents are unremarkable. No acute process in the upper abdomen. Musculoskeletal: Small amount of subcutaneous emphysema along the RIGHT chest wall as discussed. No chest wall mass. No acute bone process. Spinal degenerative change. IMPRESSION: 1. Slightly diminished RIGHT pleural fluid, dominant loculated area along the major fissure in the RIGHT  chest is unchanged. 2. Persistent basilar consolidation/volume loss without change. This may represent rounded atelectasis or pneumonia. Follow-up is suggested to ensure there is no underlying mass. 3. Small amount of subcutaneous emphysema along the chest wall. Marker for sideholes is within the pleural space as discussed above. 4. Atherosclerotic changes in the thoracic aorta. Calcified coronary artery disease. 5. Aortic atherosclerosis. Aortic Atherosclerosis (ICD10-I70.0). Electronically Signed   By: Zetta Bills M.D.   On: 07/07/2019 16:33   CT CHEST WO CONTRAST  Result Date: 06/29/2019 CLINICAL DATA:  Pleural effusion, shortness of breath. Follow-up chest x-ray EXAM: CT CHEST WITHOUT CONTRAST TECHNIQUE: Multidetector CT imaging of the chest was performed following the standard protocol without IV contrast. COMPARISON:  Chest x-ray 05/11/2019 FINDINGS: Cardiovascular: Heart is normal size. Aorta normal caliber. Scattered coronary artery and aortic calcifications. Mediastinum/Nodes: No mediastinal, hilar, or axillary adenopathy. Trachea and esophagus are unremarkable. Thyroid unremarkable. Lungs/Pleura: Partially loculated moderate-sized right pleural effusion noted. Compressive atelectasis or pneumonia in the right lower lobe. Scattered granulomas in the right lung. Left lung clear. Upper Abdomen: Imaging into the upper abdomen shows no acute findings. Musculoskeletal: Chest wall soft tissues are unremarkable. No acute bony abnormality. IMPRESSION: Moderate partially loculated right pleural effusion. Right lower lobe compressive atelectasis versus pneumonia. Electronically Signed   By: Rolm Baptise M.D.   On: 06/29/2019 14:56   DG Chest Port 1 View  Result Date: 07/11/2019 CLINICAL DATA:  Empyema. EXAM: PORTABLE CHEST 1 VIEW COMPARISON:  Yesterday FINDINGS: Additional right-sided pleural catheter placed in the interim. There is improved aeration at the right base with decreased pleural fluid. Normal  heart size. No visible pneumothorax. IMPRESSION: Improved right base aeration after additional catheter placement. Electronically Signed   By: Monte Fantasia M.D.   On: 07/11/2019 08:56   DG Chest Port 1 View  Result Date: 07/10/2019 CLINICAL DATA:  Atelectasis EXAM: PORTABLE CHEST 1 VIEW COMPARISON:  Portable exam at 0743 hrs compared to 07/09/2019 FINDINGS: Upper-normal size of cardiac silhouette. Mediastinal contours and pulmonary vascularity normal. Persistent RIGHT pleural effusion and basilar atelectasis. LEFT lung clear. No acute infiltrate or pneumothorax. Endplate spur formation thoracic spine, mild. IMPRESSION: Persistent RIGHT pleural effusion and basilar atelectasis. Electronically Signed   By: Lavonia Dana M.D.   On: 07/10/2019 08:20   DG Chest Port 1 View  Result Date: 07/09/2019 CLINICAL DATA:  Empyema, RIGHT-sided chest tube in place. EXAM: PORTABLE CHEST 1 VIEW COMPARISON:  Chest x-ray dated 07/07/2019. FINDINGS: RIGHT basilar chest tube appears stable in position. The overlying RIGHT pleural fluid is stable in extent. LEFT lung remains clear. No pneumothorax is seen. Heart size  and mediastinal contours are stable. IMPRESSION: Stable chest x-ray. RIGHT-sided chest tube is stable in position. The overlying RIGHT pleural fluid is stable in extent. Electronically Signed   By: Franki Cabot M.D.   On: 07/09/2019 10:10   DG Chest Port 1 View  Result Date: 07/07/2019 CLINICAL DATA:  Follow-up right chest tube. EXAM: PORTABLE CHEST 1 VIEW COMPARISON:  07/06/2018 FINDINGS: Chest tube remains in place at the right lung base. There is slightly less pleural fluid than was seen yesterday. No pleural air is visible. Some pleural fluid does persist, with right lower lung atelectasis. Left hemithorax appears clear. IMPRESSION: Persistent right chest tube. Slightly less right pleural fluid and less pleural air. Moderate amount of pleural fluid does persist with lower right lung atelectasis.  Electronically Signed   By: Nelson Chimes M.D.   On: 07/07/2019 11:55   DG Chest Port 1 View  Result Date: 07/06/2019 CLINICAL DATA:  Right empyema. EXAM: PORTABLE CHEST 1 VIEW COMPARISON:  Chest x-ray dated Jul 04, 2019. FINDINGS: New right pigtail chest tube with unchanged small loculated pleural effusion. The left lung is clear. No pneumothorax. The heart size and mediastinal contours are within normal limits. Normal pulmonary vascularity. No acute osseous abnormality. IMPRESSION: 1. New right-sided chest tube with unchanged small loculated pleural effusion. No pneumothorax. Electronically Signed   By: Titus Dubin M.D.   On: 07/06/2019 09:17   CT IMAGE GUIDED DRAINAGE BY PERCUTANEOUS CATHETER  Result Date: 07/10/2019 INDICATION: 74 year old with a loculated right pleural effusion. Patient already has one right chest tube but there is residual right pleural fluid and request for a second chest tube placement. EXAM: CT-GUIDED RIGHT CHEST TUBE PLACEMENT MEDICATIONS: Moderate sedation ANESTHESIA/SEDATION: 1.0 mg IV Versed 50 mcg IV Fentanyl Moderate Sedation Time:  18 minutes The patient was continuously monitored during the procedure by the interventional radiology nurse under my direct supervision. COMPLICATIONS: None immediate. TECHNIQUE: Informed written consent was obtained from the patient after a thorough discussion of the procedural risks, benefits and alternatives. All questions were addressed. A timeout was performed prior to the initiation of the procedure. PROCEDURE: Patient was placed prone on the CT scanner. Pocket of fluid along the posterior aspect of the right chest was identified and targeted. The right posterior chest was prepped with chlorhexidine and sterile field was created. Skin and soft tissues were anesthetized with 1% lidocaine. Using CT guidance, a Yueh catheter was directed into the pleural space and yellow fluid was aspirated. Superstiff Amplatz wire was advanced into the  pleural space. The tract was dilated to accommodate a 14 [place] multipurpose drain. Small amount of additional pleural fluid was aspirated. Chest tube was attached to the drainage system. Catheter was sutured to skin and a dressing was placed. FINDINGS: Loculated right pleural fluid. Pocket along the right posterior aspect of the chest was identified and targeted for drain placement. 59 French drain was successfully placed in the right pleural space. Small amount of yellow fluid was removed. Pocket of pleural air noted following chest tube placement compatible with the loculated nature of the pleural effusion. IMPRESSION: CT-guided placement of a right chest tube. Right pleural fluid is loculated. Electronically Signed   By: Markus Daft M.D.   On: 07/10/2019 13:35   CT IMAGE GUIDED DRAINAGE BY PERCUTANEOUS CATHETER  Result Date: 07/05/2019 INDICATION: Right empyema EXAM: CT-GUIDED PLACEMENT RIGHT 10 FRENCH CHEST TUBE MEDICATIONS: The patient is currently admitted to the hospital and receiving intravenous antibiotics. The antibiotics were administered within an appropriate time  frame prior to the initiation of the procedure. ANESTHESIA/SEDATION: Fentanyl 50 mcg IV; Versed 1.0 mg IV Moderate Sedation Time:  11 MINUTES The patient was continuously monitored during the procedure by the interventional radiology nurse under my direct supervision. COMPLICATIONS: None immediate. PROCEDURE: Informed written consent was obtained from the patient after a thorough discussion of the procedural risks, benefits and alternatives. All questions were addressed. Maximal Sterile Barrier Technique was utilized including caps, mask, sterile gowns, sterile gloves, sterile drape, hand hygiene and skin antiseptic. A timeout was performed prior to the initiation of the procedure. Previous imaging reviewed. Patient positioned right anterior oblique. Noncontrast localization CT performed. The loculated right effusion was localized and  marked for a lateral posterior approach. Under sterile conditions and local anesthesia, 18 gauge introducer needle was advanced from a posterolateral lower intercostal approach into the pleural fluid. Needle position confirmed with CT. Guidewire inserted followed by tract dilatation insert a 10 French drain. Drain catheter position confirmed with CT. Syringe aspiration yielded 10 cc of thick mucinous exudative fluid. Sample sent for pan culture. Catheter secured with Prolene suture and connected to external pleura vac. Sterile dressing applied. No immediate complication. Patient tolerated the procedure well. IMPRESSION: Successful CT-guided 10 French right chest tube insertion. Electronically Signed   By: Jerilynn Mages.  Shick M.D.   On: 07/05/2019 15:44   US THORACENTESIS ASP PLEURAL SPACE W/IMG GUIDE  Result Date: 07/04/2019 CLINICAL DATA:  Loculated right pleural effusion. EXAM: ULTRASOUND GUIDED RIGHT THORACENTESIS COMPARISON:  CT of the chest on 06/29/2019 PROCEDURE: An ultrasound guided thoracentesis was thoroughly discussed with the patient and questions answered. The benefits, risks, alternatives and complications were also discussed. The patient understands and wishes to proceed with the procedure. Written consent was obtained. Ultrasound was performed to localize and mark an adequate pocket of fluid in the right chest. The area was then prepped and draped in the normal sterile fashion. 1% Lidocaine was used for local anesthesia. Under ultrasound guidance a 6 French Safe-T-Centesis catheter was introduced. Fluid was removed from the catheter. The catheter was then removed. Under ultrasound guidance, a 5 [place] Yueh centesis catheter was then introduced in a slightly higher pocket of pleural fluid in the right posterior chest. Aspiration was performed through the centesis catheter. The catheter was removed and a dressing applied. FINDINGS: Ultrasound shows a very complex appearing and loculated right pleural  effusion with relatively small components posteriorly. From the first pocket sampled in the right posterior pleural space towards the lung base, only 10 mL of clear appearing amber fluid was able to be aspirated. The sample was sent for various requested labs. Decision was made to sample a second slightly higher pocket yielding 5 mL of grossly purulent whitish green thick fluid. This sample was sent for culture analysis. IMPRESSION: Highly loculated and complex right pleural effusion with relatively small pockets posteriorly. The first pocket yielded 10 mL of clear appearing amber fluid. The second more superior pocket yielded 5 mL of grossly purulent fluid likely consistent with empyema. Fluid analysis will be performed on both samples. Electronically Signed   By: Aletta Edouard M.D.   On: 07/04/2019 14:24      ASSESSMENT/PLAN   Community acquired Pneumonia  -present on admission - due to gram positive cocci such as streptococcus or staph aureus - patient has had numerous courses of antibiotics on outpatient basis- higher risk for resistant bacteria -s/p thoracentesis with - GPC + - appreciate Infectious disease and pharmD-vanco dcd -procalcitonin trending -MRSA nasal PCR  -bronchopulmonary  hygiene with Acapella and IS x10/h -procalcitonin <.10 -Recruitment maneuvers for atelecatatic right lung - metaneb q4h -continue PT -CXR 07/11/19- improved from previous   Empyema of right pleural space  - +GPCs - s/p 24F pigtail cathter -fluid studies resent today and recultured -CT surgery on case Dr Genevive Bi - intrapleural tPA ongoing - - appreciate input    Thank you for allowing me to participate in the care of this patient.  .   Patient/Family are satisfied with care plan and all questions have been answered.  This document was prepared using Dragon voice recognition software and may include unintentional dictation errors.     Ottie Glazier, M.D.  Division of [place]

## 2019-07-11 NOTE — Progress Notes (Signed)
Patient ID: Andrew Myers, male   DOB: 1945-12-04, 74 y.o.   MRN: [id number]  Tried on multiple occassions to infuse TPA through the new chest tube.  Unable to do so.  Dressings removed and tube intact without kink.  Still unable to infuse.  Will leave on suction if by chance some TPA was able to open tube.  Removed the original tube

## 2019-07-11 NOTE — Progress Notes (Signed)
[facility] Follow Up Note  Patient ID: Ferry Hottle, male   DOB: December 17, 1945, 74 y.o.   MRN: [id number]  HISTORY: Did well overnight.  No fever.  Not short of breath.    Vitals:   07/11/19 0700 07/11/19 0730  BP:  120/64  Pulse: 68 75  Resp: (!) 22 20  Temp:  98.6 F (37 C)  SpO2: 96% 97%     EXAM:  Resp: Lungs are clear bilaterally.  No respiratory distress, normal effort. Heart:  Regular without murmurs Abd:  Abdomen is soft, non distended and non tender. No masses are palpable.  There is no rebound and no guarding.  Neurological: Alert and oriented to person, place, and time. Coordination normal.  Skin: Skin is warm and dry. No rash noted. No diaphoretic. No erythema. No pallor.  Psychiatric: Normal mood and affect. Normal behavior. Judgment and thought content normal.    Independent review of CXRay shows improvement in pleural effusion.    No air leak.  Minimal if any drainage from original small bore pigtail  ASSESSMENT: Right empyema.   PLAN:   We will administer TPA in the new pigtail We will encourage ambulation.   Will ask PT to see and assist in ambulation    Nestor Lewandowsky, MDPatient ID: [name], male   DOB: 1945-05-08, 74 y.o.   MRN: [id number]

## 2019-07-11 NOTE — Progress Notes (Signed)
PROGRESS NOTE    Andrew Myers  [name] DOB: 20-Dec-1945 DOA: 07/05/2019 PCP: Derinda Late, MD    Brief Narrative:  Andrew Myers is a 74 y.o. male with medical history significant of recent pna, htn, hyperglycemia, obesity, sleep apnea sent to ED by pulmonology for treatment of empyema.  Patient reported in March of this year he was treated for LLL infiltrate.  He completed 3 rounds antibiotics and steroids. By 4/29 he remained symptomatic.  Associated symptoms include intermittent fevers with max temp 99.9, intermittent right chest stabbing pain worse with deep breath some mild shortness of breath and general malaise.  He denies headache dizziness syncope or near syncope.  He denies chest pain palpitations lower extremity edema.  He denies abdominal pain nausea vomiting diarrhea constipation melena bright red blood per rectum.  He denies any dysuria hematuria frequency or urgency.  CT chest revealed pleural effusion. Underwent thoracentesis 07/04/19 yielding 67ml fluid. Fluid studies revealed gram +bacteria concerning for empyema. Patient was called at home and instructed to come to ED for admission.    Consultants:   ID, pulmonary, CT surgery  Procedures: Chest tube  Antimicrobials:   Zosyn Vanco   Subjective: Has no complaints this a.m.  Objective: Vitals:   07/11/19 0500 07/11/19 0700 07/11/19 0730 07/11/19 1116  BP:   120/64 (!) 147/75  Pulse: 80 68 75 77  Resp: 19 (!) 22 20 20   Temp:   98.6 F (37 C) 98.5 F (36.9 C)  TempSrc:   Oral Oral  SpO2: 95% 96% 97% 98%  Weight:      Height:        Intake/Output Summary (Last 24 hours) at 07/11/2019 1357 Last data filed at 07/11/2019 1042 Gross per 24 hour  Intake 3221.53 ml  Output 3891 ml  Net -669.47 ml   Filed Weights   07/09/19 0449 07/10/19 0408 07/11/19 0359  Weight: 98.4 kg 99.3 kg 98.1 kg    Examination:  General exam: Appears calm and comfortable, NAD Respiratory system: Clear to  auscultation. Respiratory effort normal.no wheezing or rhonchi Right chest tube  X2, with little serosanguiness fluid Cardiovascular system: S1 & S2 heard, RRR. No JVD, murmurs, rubs, gallops or clicks.  Gastrointestinal system: Abdomen is nondistended, soft and nontender. Normal bowel sounds heard.   Central nervous system: Alert and oriented.  Grossly intact Extremities: No edema  Skin: Warm dry Psychiatry: Judgement and insight appear normal. Mood & affect appropriate.     Data Reviewed: I have personally reviewed following labs and imaging studies  CBC: Recent Labs  Lab 07/05/19 1036 07/06/19 0502 07/07/19 0438 07/09/19 0616 07/10/19 0622  WBC 11.5* 10.3 12.3* 10.6* 8.2  NEUTROABS 8.5*  --   --  7.9*  --   HGB 13.4 12.2* 12.1* 11.5* 10.9*  HCT 39.5 36.0* 37.0* 35.4* 31.9*  MCV 84.4 83.7 86.7 86.1 83.3  PLT 308 257 261 221 [id number]   Basic Metabolic Panel: Recent Labs  Lab 07/05/19 1036 07/06/19 0502 07/07/19 0438 07/10/19 0622 07/11/19 0533  NA 135 137 138 138  --   K 3.4* 3.5 3.8 3.4* 3.6  CL 97* 103 104 105  --   CO2 26 26 25 25   --   GLUCOSE 150* 123* 152* 127*  --   BUN 10 10 8 10   --   CREATININE 0.60* 0.59* 0.65 0.49*  --   CALCIUM 9.1 8.5* 8.5* 8.4*  --    GFR: Estimated Creatinine Clearance: 99.6 mL/min (A) (by C-G formula  based on SCr of 0.49 mg/dL (L)). Liver Function Tests: Recent Labs  Lab 07/05/19 1036  AST 24  ALT 30  ALKPHOS 57  BILITOT 0.8  PROT 8.1  ALBUMIN 3.5   No results for input(s): LIPASE, AMYLASE in the last 168 hours. No results for input(s): AMMONIA in the last 168 hours. Coagulation Profile: No results for input(s): INR, PROTIME in the last 168 hours. Cardiac Enzymes: No results for input(s): CKTOTAL, CKMB, CKMBINDEX, TROPONINI in the last 168 hours. BNP (last 3 results) No results for input(s): PROBNP in the last 8760 hours. HbA1C: No results for input(s): HGBA1C in the last 72 hours. CBG: No results for input(s): GLUCAP  in the last 168 hours. Lipid Profile: No results for input(s): CHOL, HDL, LDLCALC, TRIG, CHOLHDL, LDLDIRECT in the last 72 hours. Thyroid Function Tests: No results for input(s): TSH, T4TOTAL, FREET4, T3FREE, THYROIDAB in the last 72 hours. Anemia Panel: No results for input(s): VITAMINB12, FOLATE, FERRITIN, TIBC, IRON, RETICCTPCT in the last 72 hours. Sepsis Labs: Recent Labs  Lab 07/05/19 1036 07/06/19 0502 07/07/19 0438  PROCALCITON <0.10 <0.10 <0.10  LATICACIDVEN 1.9  --   --     Recent Results (from the past 240 hour(s))  SARS Coronavirus 2 by RT PCR (hospital order, performed in [facility] hospital lab) Nasopharyngeal Nasopharyngeal Swab     Status: None   Collection Time: 07/04/19 10:41 AM   Specimen: Nasopharyngeal Swab  Result Value Ref Range Status   SARS Coronavirus 2 NEGATIVE NEGATIVE Final    Comment: (NOTE) SARS-CoV-2 target nucleic acids are NOT DETECTED. The SARS-CoV-2 RNA is generally detectable in upper and lower respiratory specimens during the acute phase of infection. The lowest concentration of SARS-CoV-2 viral copies this assay can detect is 250 copies / mL. A negative result does not preclude SARS-CoV-2 infection and should not be used as the sole basis for treatment or other patient management decisions.  A negative result may occur with improper specimen collection / handling, submission of specimen other than nasopharyngeal swab, presence of viral mutation(s) within the areas targeted by this assay, and inadequate number of viral copies (<250 copies / mL). A negative result must be combined with clinical observations, patient history, and epidemiological information. Fact Sheet for Patients:   StrictlyIdeas.no Fact Sheet for Healthcare Providers: BankingDealers.co.za This test is not yet approved or cleared  by the [place] FDA and has been authorized for detection and/or diagnosis of SARS-CoV-2  by FDA under an Emergency Use Authorization (EUA).  This EUA will remain in effect (meaning this test can be used) for the duration of the COVID-19 declaration under Section 564(b)(1) of the Act, 21 U.S.C. section 360bbb-3(b)(1), unless the authorization is terminated or revoked sooner. Performed at [facility], [address] [place], [place] [address]   Body fluid culture     Status: None   Collection Time: 07/04/19  1:00 PM   Specimen: PATH Cytology Pleural fluid  Result Value Ref Range Status   Specimen Description   Final    PLEURAL Performed at [facility], [address], [place], [place] [address]    Special Requests   Final    NONE Performed at [facility], [address] [place], [place] [address]    Gram Stain   Final    FEW WBC PRESENT,BOTH PMN AND MONONUCLEAR FEW GRAM POSITIVE COCCI    Culture   Final    NO GROWTH 3 DAYS Performed at [facility], [address] [place], [place] [address]    Report Status 07/08/2019 FINAL  Final  Acid Fast Smear (AFB)     Status: None   Collection Time: 07/04/19  1:00 PM   Specimen: PATH Cytology Pleural fluid  Result Value Ref Range Status   AFB Specimen Processing Concentration  Final   Acid Fast Smear Negative  Final    Comment: (NOTE) Performed At: [facility] [address] [place], [place] [id number] Rush Farmer MD [id number]    Source (AFB) PLEURAL  Final    Comment: Performed at [facility], [address] [place], [place] [address]  Blood culture (routine x 2)     Status: None   Collection Time: 07/05/19  1:14 PM   Specimen: BLOOD  Result Value Ref Range Status   Specimen Description BLOOD LEFT ANTECUBITAL  Final   Special Requests   Final    BOTTLES DRAWN AEROBIC AND ANAEROBIC Blood Culture results may not be optimal due to an excessive volume of blood received in culture bottles   Culture   Final    NO GROWTH 5 DAYS Performed at [facility], [address] [place], [place] [address]    Report Status 07/10/2019 FINAL  Final  Blood culture (routine x 2)     Status: None   Collection Time: 07/05/19  1:14 PM   Specimen: BLOOD  Result Value Ref Range Status   Specimen Description BLOOD RIGHT ANTECUBITAL  Final   Special Requests   Final    BOTTLES DRAWN AEROBIC AND ANAEROBIC Blood Culture results may not be optimal due to an inadequate volume of blood received in culture bottles   Culture   Final    NO GROWTH 5 DAYS Performed at [facility], [address], [place], [place] [address]    Report Status 07/10/2019 FINAL  Final  Aerobic/Anaerobic Culture (surgical/deep wound)     Status: None   Collection Time: 07/05/19  3:45 PM   Specimen: Fluid; Abscess  Result Value Ref Range Status   Specimen Description FLUID RIGHT PLEURAL  Final   Special Requests NONE  Final   Gram Stain   Final    RARE WBC PRESENT,BOTH PMN AND MONONUCLEAR NO ORGANISMS SEEN    Culture   Final    No growth aerobically or anaerobically. Performed at [facility], [place] [address], [place], [place] [address]    Report Status 07/10/2019 FINAL  Final  Fungus Culture With Stain     Status: None (Preliminary result)   Collection Time: 07/05/19  3:45 PM   Specimen: Pleural, Right  Result Value Ref Range Status   Fungus Stain Final report  Final    Comment: (NOTE) Performed At: [facility] [place], [place] [id number] Rush Farmer MD [id number]    Fungus (Mycology) Culture PENDING  Incomplete   Fungal Source LUNG  Final    Comment: RIGHT Performed at [facility], [address] [place],  [address]   Acid Fast Smear (AFB)     Status: None   Collection Time: 07/05/19  3:45 PM   Specimen: Pleural, Right  Result Value Ref Range Status   AFB Specimen Processing Concentration  Final   Acid Fast Smear Negative  Final    Comment: (NOTE) Performed At: [facility] [place], [place] [id number] Rush Farmer MD [id number]    Source (AFB) LUNG  Final    Comment: RIGHT Performed at [facility], [place], [place] [address]   Fungus Culture Result     Status: None   Collection Time: 07/05/19  3:45 PM  Result Value Ref Range Status   Result 1 Comment  Final    Comment: (NOTE) KOH/Calcofluor preparation:  no fungus observed. Performed At: [facility] [place], [place] [id number] Rush Farmer MD [id number]   MRSA PCR Screening     Status: None   Collection Time: 07/05/19 11:05 PM  Result Value Ref Range Status   MRSA by PCR NEGATIVE NEGATIVE Final    Comment:        The GeneXpert MRSA Assay (FDA approved for NASAL specimens only), is one component of a comprehensive MRSA colonization surveillance program. It is not intended to diagnose MRSA infection nor to guide or monitor treatment for MRSA infections. Performed at [facility], [address], [place], [place] [address]          Radiology Studies: [facility] Chest [place] 1 View  Result Date: 07/11/2019 CLINICAL DATA:  Empyema. EXAM: PORTABLE CHEST 1 VIEW COMPARISON:  Yesterday FINDINGS: Additional right-sided pleural catheter placed in the interim. There is improved aeration at the right base with decreased pleural fluid. Normal heart size. No visible pneumothorax. IMPRESSION: Improved right base aeration after additional catheter placement. Electronically Signed   By: Monte Fantasia M.D.   On: 07/11/2019 08:56   DG Chest Port 1 View  Result Date: 07/10/2019 CLINICAL DATA:  Atelectasis EXAM: PORTABLE CHEST 1 VIEW COMPARISON:  Portable exam at 0743 hrs compared to 07/09/2019 FINDINGS: Upper-normal size of cardiac silhouette. Mediastinal contours and pulmonary vascularity normal. Persistent RIGHT pleural effusion and basilar atelectasis. LEFT lung clear. No acute infiltrate or pneumothorax. Endplate spur formation thoracic  spine, mild. IMPRESSION: Persistent RIGHT pleural effusion and basilar atelectasis. Electronically Signed   By: Lavonia Dana M.D.   On: 07/10/2019 08:20   CT IMAGE GUIDED DRAINAGE BY PERCUTANEOUS CATHETER  Result Date: 07/10/2019 INDICATION: 74 year old with a loculated right pleural effusion. Patient already has one right chest tube but there is residual right pleural fluid and request for a second chest tube placement. EXAM: CT-GUIDED RIGHT CHEST TUBE PLACEMENT MEDICATIONS: Moderate sedation ANESTHESIA/SEDATION: 1.0 mg IV Versed 50 mcg IV Fentanyl Moderate Sedation Time:  18 minutes The patient was continuously monitored during the procedure by the interventional radiology nurse under my direct supervision. COMPLICATIONS: None immediate. TECHNIQUE: Informed written consent was obtained from the patient after a thorough discussion of the procedural risks, benefits and alternatives. All questions were addressed. A timeout was performed prior to the initiation of the procedure. PROCEDURE: Patient was placed prone on the CT scanner. Pocket of fluid along the posterior aspect of the right chest was identified and targeted. The right posterior chest was prepped with chlorhexidine and sterile field was created. Skin and soft tissues were anesthetized with 1% lidocaine. Using CT guidance, a Yueh catheter was directed into the pleural space and yellow fluid was aspirated. Superstiff Amplatz wire was advanced into the pleural space. The tract was dilated to accommodate a 14 [place] multipurpose drain. Small amount of additional pleural fluid was aspirated. Chest tube was attached to the drainage system. Catheter was sutured to skin and a dressing was placed. FINDINGS: Loculated right pleural fluid. Pocket along the right posterior aspect of the chest was identified and targeted for drain placement. 72 French drain was successfully placed in the right pleural space. Small amount of yellow fluid was removed. Pocket of  pleural  air noted following chest tube placement compatible with the loculated nature of the pleural effusion. IMPRESSION: CT-guided placement of a right chest tube. Right pleural fluid is loculated. Electronically Signed   By: Markus Daft M.D.   On: 07/10/2019 13:35        Scheduled Meds: . alteplase (tPA) 10mg  in NS 78mL for Dr.Oaks (intrapleural administration/[facility])   Intrapleural Once  . amLODipine  5 mg Oral Daily  . aspirin EC  162 mg Oral Daily  . atorvastatin  20 mg Oral Daily  . hydrochlorothiazide  25 mg Oral Daily  . irbesartan  37.5 mg Oral Daily  . loratadine  10 mg Oral Daily  . nebivolol  5 mg Oral Daily  . tamsulosin  0.8 mg Oral QPC supper   Continuous Infusions: . ampicillin-sulbactam (UNASYN) IV 3 g (07/11/19 1239)    Assessment & Plan:   Principal Problem:   Empyema ([place]) Active Problems:   Hypertension   Hyperlipidemia   BPH (benign prostatic hyperplasia)   Hypokalemia   Obesity   Sleep apnea   Hyperglycemia   Empyema of right pleural space ([place])   #1.Rt  Empyema.  Patient underwent a thoracentesis May 11 and fluid studies positive for gram-positive bacteria.  He was instructed to come to the emergency department for admission/treatment. -Continue IV antibiotics. Will place patient on Vancomycin and Zosyn per pharmacy dosing -Follow blood cultures-neg. Id changed zosyn to unasyn. -Gentle IV fluids -Supportive therapy -Chest tube placed, s/p intrapleural thrombolytics (TPA) on 5/13 CTS ordered CT yesterday. Per surgery TPA v.s. pigtail by IR.. ID input was appreciated, d/c vanco since MRSA negative.  cotninue unasyn ID following IR placed a second larger bore chest tube to facilitate drainage/further tPA as well Plan: TPA by cts today in the new pigtail today    #2.  Hypokalemia.  Mild.  Likely related to diuretic  K today 3.6, improved with repletion    #3.  Hypertension.  continue  amlodipine, hydrochlorothiazide, Bystolic,  valsartan.   #4.  Sleep apnea.  uses home BiPAP. -Respiratory consult for device  #5.  BPH.  Stable at baseline. -Continue home meds  #6.  Obesity.  BMI 32. -nutritional consult  #7.  Hyperlipidemia.   Continue statins  #8.  Hyperglycemia. No hx of same. Likely related to recent  steroid use.  -   #9. Pulmonary Hypertension Pt reports not having this . Takes Sildenafil for ED , not tid.  It has been changed   DVT prophylaxis: SCD Code Status: Full Family Communication: None at bedside Disposition Plan: Back to previous home life Barrier: Has chest tube in place needs further surgical and medical management, currently not medically stable for discharge, also requires iv abx and TPA today. Likely here for few more days       LOS: 6 days   Time spent: 45 minutes with more than 50% COC    Nolberto Hanlon, MD [facility] Pager [phone]  If 7PM-7AM, please contact night-coverage [email or web address] Password [facility] 07/11/2019, 1:57 PM Patient ID: Andrew Myers, male   DOB: Feb 12, 1946, 74 y.o.   MRN: [id number]

## 2019-07-12 ENCOUNTER — Inpatient Hospital Stay: Payer: PPO

## 2019-07-12 LAB — CBC WITH DIFFERENTIAL/PLATELET
Abs Immature Granulocytes: 0.03 10*3/uL (ref 0.00–0.07)
Basophils Absolute: 0.1 10*3/uL (ref 0.0–0.1)
Basophils Relative: 1 %
Eosinophils Absolute: 0.3 10*3/uL (ref 0.0–0.5)
Eosinophils Relative: 3 %
HCT: 34.4 % — ABNORMAL LOW (ref 39.0–52.0)
Hemoglobin: 11.8 g/dL — ABNORMAL LOW (ref 13.0–17.0)
Immature Granulocytes: 0 %
Lymphocytes Relative: 16 %
Lymphs Abs: 1.4 10*3/uL (ref 0.7–4.0)
MCH: 28.6 pg (ref 26.0–34.0)
MCHC: 34.3 g/dL (ref 30.0–36.0)
MCV: 83.3 fL (ref 80.0–100.0)
Monocytes Absolute: 0.7 10*3/uL (ref 0.1–1.0)
Monocytes Relative: 8 %
Neutro Abs: 6.4 10*3/uL (ref 1.7–7.7)
Neutrophils Relative %: 72 %
Platelets: 228 10*3/uL (ref 150–400)
RBC: 4.13 MIL/uL — ABNORMAL LOW (ref 4.22–5.81)
RDW: 13.4 % (ref 11.5–15.5)
WBC: 8.9 10*3/uL (ref 4.0–10.5)
nRBC: 0 % (ref 0.0–0.2)

## 2019-07-12 LAB — BASIC METABOLIC PANEL
Anion gap: 10 (ref 5–15)
BUN: 10 mg/dL (ref 8–23)
CO2: 23 mmol/L (ref 22–32)
Calcium: 8.6 mg/dL — ABNORMAL LOW (ref 8.9–10.3)
Chloride: 104 mmol/L (ref 98–111)
Creatinine, Ser: 0.6 mg/dL — ABNORMAL LOW (ref 0.61–1.24)
GFR calc Af Amer: >60 mL/min (ref >60)
GFR calc non Af Amer: >60 mL/min (ref >60)
Glucose, Bld: 122 mg/dL — ABNORMAL HIGH (ref 70–99)
Potassium: 3.5 mmol/L (ref 3.5–5.1)
Sodium: 137 mmol/L (ref 135–145)

## 2019-07-12 NOTE — Progress Notes (Signed)
ID  Pt doing well Had one chest drain removed yesterday Ambulating  Patient Vitals for the past 24 hrs:  BP Temp Temp src Pulse Resp SpO2 Weight  07/12/19 1540 133/73 98 F (36.7 C) Oral 74 18 97 % --  07/12/19 1159 136/73 97.8 F (36.6 C) -- 85 18 95 % --  07/12/19 0734 (!) 142/62 98.7 F (37.1 C) Oral 75 17 94 % --  07/12/19 0500 -- 99.4 F (37.4 C) -- -- -- -- --  07/12/19 0444 (!) 149/82 98.7 F (37.1 C) Oral 80 -- 96 % 97.5 kg  07/11/19 1947 139/72 98.6 F (37 C) Oral 77 -- 96 % --    O/e awake and alert, does not look ill, Chest rt chest drain- decreased air entry rt base Hs; s1s2 Abd soft CNS non focal  Labs CBC Latest Ref Rng & Units 07/12/2019 07/10/2019 07/09/2019  WBC 4.0 - 10.5 K/uL 8.9 8.2 10.6(H)  Hemoglobin 13.0 - 17.0 g/dL 11.8(L) 10.9(L) 11.5(L)  Hematocrit 39.0 - 52.0 % 34.4(L) 31.9(L) 35.4(L)  Platelets 150 - 400 K/uL 228 203 221    CMP Latest Ref Rng & Units 07/12/2019 07/11/2019 07/10/2019  Glucose 70 - 99 mg/dL 122(H) - 127(H)  BUN 8 - 23 mg/dL 10 - 10  Creatinine 0.61 - 1.24 mg/dL 0.60(L) - 0.49(L)  Sodium 135 - 145 mmol/L 137 - 138  Potassium 3.5 - 5.1 mmol/L 3.5 3.6 3.4(L)  Chloride 98 - 111 mmol/L 104 - 105  CO2 22 - 32 mmol/L 23 - 25  Calcium 8.9 - 10.3 mg/dL 8.6(L) - 8.4(L)  Total Protein 6.5 - 8.1 g/dL - - -  Total Bilirubin 0.3 - 1.2 mg/dL - - -  Alkaline Phos 38 - 126 U/L - - -  AST 15 - 41 U/L - - -  ALT 0 - 44 U/L - - -    Micro Pleural fluid NG  Impression/recommedation  loculated empyema rt lung- S/p chest drains, TPA Removal of 1 drain On unasyn Culture neg Likely source could be oral organisms On discharge change tom Po augemtnin for another 2-3 weeks  ID will sign off- call if needed

## 2019-07-12 NOTE — Progress Notes (Signed)
PROGRESS NOTE    Brenden Manes  [name] DOB: February 25, 1945 DOA: 07/05/2019 PCP: Derinda Late, MD    Brief Narrative: HPI: Andrew Trembly Wilsonis a 74 y.o.malewith medical history significant ofrecent pna, htn, hyperglycemia, obesity, sleep apneasent to ED by pulmonology for treatment of empyema. Patient reported in March of this year he was treatedfor LLL infiltrate.He completed 3rounds antibiotics and steroids. By 4/29 he remained symptomatic. Associated symptoms include intermittent fevers with max temp 99.9, intermittent right chest stabbing pain worse with deep breath some mild shortness of breath and general malaise. He denies headache dizziness syncope or near syncope. He denies chest pain palpitations lower extremity edema. He denies abdominal pain nausea vomiting diarrhea constipation melena bright red blood per rectum. He denies any dysuria hematuria frequency or urgency. CT chest revealed pleural effusion. Underwent thoracentesis 07/04/19 yielding 67ml fluid. Fluid studies revealedgram +bacteria concerning for empyema. Patient was called at home and instructed to come to ED for admission.  5/19: Patient seen and examined.  Overall doing well.  Second chest tube removed per CTS yesterday.  The remaining chest tube CTS was unable to instill TPA through.  Concern for kinked tube.  Patient relatively asymptomatic at this time.  Pain well controlled.  No fevers over interval.     Assessment & Plan:   Principal Problem:   Empyema ([place]) Active Problems:   Hypertension   Hyperlipidemia   BPH (benign prostatic hyperplasia)   Hypokalemia   Obesity   Sleep apnea   Hyperglycemia   Empyema of right pleural space (HCC)  Rt Empyema  Patient underwent a thoracentesis May 11 and fluid studies positive for gram-positive bacteria. He was instructed to come to the emergency department for admission/treatment. Patient now status post removal of 1 chest tube on  07/11/2019 Remaining chest tube CTS was unable to instill TPA Patient seen by general surgery this morning, will plan to monitor on waterseal today.  CTS will evaluate for chest tube removal tomorrow Plan: Continue Unasyn for now CTS recommendations for chest tube management Every morning chest x-ray Pulmonary toileting Pain control as needed We will follow up with infectious disease in regards to long-term antibiotic therapy once chest tube removed  Hypokalemia Mild. Likely related to diuretic Replace as needed  Hypertension  continue  amlodipine, hydrochlorothiazide, Bystolic, valsartan.  Sleep apnea uses home BiPAP. -Respiratory consult fordevice  BPH  Stable at baseline. -Continue home meds  Obesity BMI 32. -nutritional consult  Hyperlipidemia Continue statins   DVT prophylaxis: SCDs Code Status: Full Family Communication: None today, offered to call, patient declined Disposition Plan: Status is: Inpatient  Remains inpatient appropriate because:Inpatient level of care appropriate due to severity of illness   Dispo: The patient is from: Home              Anticipated d/c is to: Home              Anticipated d/c date is: 3 days              Patient currently is not medically stable to d/c.   Patient still with chest tube in place.  Possible discontinuation of chest tube tomorrow.  Continues on IV antibiotics.  Will need reevaluation by infectious disease prior to formulation of any disposition plan.  Given gram-positive bacteremia patient might require PICC line placement for long-term IV antibiotic therapy.  Defer to infectious disease.   Consultants:   ID, CTS, pulmonary  Procedures:   Chest tube  Antimicrobials:   Unasyn  Subjective: Patient seen and examined Pain well controlled No complaints  Objective: Vitals:   07/12/19 0444 07/12/19 0500 07/12/19 0734 07/12/19 1159  BP: (!) 149/82  (!) 142/62 136/73  Pulse: 80  75 85   Resp:   17 18  Temp: 98.7 F (37.1 C) 99.4 F (37.4 C) 98.7 F (37.1 C) 97.8 F (36.6 C)  TempSrc: Oral  Oral   SpO2: 96%  94% 95%  Weight: 97.5 kg     Height:        Intake/Output Summary (Last 24 hours) at 07/12/2019 1355 Last data filed at 07/12/2019 0834 Gross per 24 hour  Intake 480 ml  Output 300 ml  Net 180 ml   Filed Weights   07/10/19 0408 07/11/19 0359 07/12/19 0444  Weight: 99.3 kg 98.1 kg 97.5 kg    Examination:  General exam: Appears calm and comfortable  Respiratory system: Left-sided pigtail catheter.  Lung sounds decreased on right Cardiovascular system: S1 & S2 heard, RRR. No JVD, murmurs, rubs, gallops or clicks. No pedal edema. Gastrointestinal system: Abdomen is nondistended, soft and nontender. No organomegaly or masses felt. Normal bowel sounds heard. Central nervous system: Alert and oriented. No focal neurological deficits. Extremities: Symmetric 5 x 5 power. Skin: No rashes, lesions or ulcers Psychiatry: Judgement and insight appear normal. Mood & affect appropriate.     Data Reviewed: I have personally reviewed following labs and imaging studies  CBC: Recent Labs  Lab 07/06/19 0502 07/07/19 0438 07/09/19 0616 07/10/19 0622 07/12/19 0520  WBC 10.3 12.3* 10.6* 8.2 8.9  NEUTROABS  --   --  7.9*  --  6.4  HGB 12.2* 12.1* 11.5* 10.9* 11.8*  HCT 36.0* 37.0* 35.4* 31.9* 34.4*  MCV 83.7 86.7 86.1 83.3 83.3  PLT 257 261 221 203 [id number]   Basic Metabolic Panel: Recent Labs  Lab 07/06/19 0502 07/07/19 0438 07/10/19 0622 07/11/19 0533 07/12/19 0520  NA 137 138 138  --  137  K 3.5 3.8 3.4* 3.6 3.5  CL 103 104 105  --  104  CO2 26 25 25   --  23  GLUCOSE 123* 152* 127*  --  122*  BUN 10 8 10   --  10  CREATININE 0.59* 0.65 0.49*  --  0.60*  CALCIUM 8.5* 8.5* 8.4*  --  8.6*   GFR: Estimated Creatinine Clearance: 99.4 mL/min (A) (by C-G formula based on SCr of 0.6 mg/dL (L)). Liver Function Tests: No results for input(s): AST, ALT, ALKPHOS,  BILITOT, PROT, ALBUMIN in the last 168 hours. No results for input(s): LIPASE, AMYLASE in the last 168 hours. No results for input(s): AMMONIA in the last 168 hours. Coagulation Profile: No results for input(s): INR, PROTIME in the last 168 hours. Cardiac Enzymes: No results for input(s): CKTOTAL, CKMB, CKMBINDEX, TROPONINI in the last 168 hours. BNP (last 3 results) No results for input(s): PROBNP in the last 8760 hours. HbA1C: No results for input(s): HGBA1C in the last 72 hours. CBG: No results for input(s): GLUCAP in the last 168 hours. Lipid Profile: No results for input(s): CHOL, HDL, LDLCALC, TRIG, CHOLHDL, LDLDIRECT in the last 72 hours. Thyroid Function Tests: No results for input(s): TSH, T4TOTAL, FREET4, T3FREE, THYROIDAB in the last 72 hours. Anemia Panel: No results for input(s): VITAMINB12, FOLATE, FERRITIN, TIBC, IRON, RETICCTPCT in the last 72 hours. Sepsis Labs: Recent Labs  Lab 07/06/19 0502 07/07/19 0438  PROCALCITON <0.10 <0.10    Recent Results (from the past 240 hour(s))  SARS Coronavirus 2 by  RT PCR (hospital order, performed in [facility] hospital lab) Nasopharyngeal Nasopharyngeal Swab     Status: None   Collection Time: 07/04/19 10:41 AM   Specimen: Nasopharyngeal Swab  Result Value Ref Range Status   SARS Coronavirus 2 NEGATIVE NEGATIVE Final    Comment: (NOTE) SARS-CoV-2 target nucleic acids are NOT DETECTED. The SARS-CoV-2 RNA is generally detectable in upper and lower respiratory specimens during the acute phase of infection. The lowest concentration of SARS-CoV-2 viral copies this assay can detect is 250 copies / mL. A negative result does not preclude SARS-CoV-2 infection and should not be used as the sole basis for treatment or other patient management decisions.  A negative result may occur with improper specimen collection / handling, submission of specimen other than nasopharyngeal swab, presence of viral mutation(s) within the areas  targeted by this assay, and inadequate number of viral copies (<250 copies / mL). A negative result must be combined with clinical observations, patient history, and epidemiological information. Fact Sheet for Patients:   StrictlyIdeas.no Fact Sheet for Healthcare Providers: BankingDealers.co.za This test is not yet approved or cleared  by the [place] FDA and has been authorized for detection and/or diagnosis of SARS-CoV-2 by FDA under an Emergency Use Authorization (EUA).  This EUA will remain in effect (meaning this test can be used) for the duration of the COVID-19 declaration under Section 564(b)(1) of the Act, 21 U.S.C. section 360bbb-3(b)(1), unless the authorization is terminated or revoked sooner. Performed at [facility], [address] [place], [place] [address]   Body fluid culture     Status: None   Collection Time: 07/04/19  1:00 PM   Specimen: PATH Cytology Pleural fluid  Result Value Ref Range Status   Specimen Description   Final    PLEURAL Performed at [facility], [address], [place], [place] [address]    Special Requests   Final    NONE Performed at [facility], [address] [place], [place] [address]    Gram Stain   Final    FEW WBC PRESENT,BOTH PMN AND MONONUCLEAR FEW GRAM POSITIVE COCCI    Culture   Final    NO GROWTH 3 DAYS Performed at [facility], [place] [address] [place], [place] [address]    Report Status 07/08/2019 FINAL  Final  Acid Fast Smear (AFB)     Status: None   Collection Time: 07/04/19  1:00 PM   Specimen: PATH Cytology Pleural fluid  Result Value Ref Range Status   AFB Specimen Processing Concentration  Final   Acid Fast Smear Negative  Final    Comment: (NOTE) Performed At: [facility] [address] [place], [place] [id number] Rush Farmer MD [id number]    Source (AFB) PLEURAL  Final    Comment: Performed at [facility], [address] [place], [place] [address]  Blood culture (routine x 2)     Status: None   Collection Time: 07/05/19  1:14 PM   Specimen: BLOOD  Result Value Ref Range Status   Specimen Description BLOOD LEFT ANTECUBITAL  Final   Special Requests   Final    BOTTLES DRAWN AEROBIC AND ANAEROBIC Blood Culture results may not be optimal due to an excessive volume of blood received in culture bottles   Culture   Final    NO GROWTH 5 DAYS Performed at [facility], [address] [place], [place] [address]    Report Status 07/10/2019 FINAL  Final  Blood culture (routine x 2)     Status: None   Collection Time: 07/05/19  1:14 PM   Specimen: BLOOD  Result Value Ref Range Status   Specimen Description BLOOD RIGHT ANTECUBITAL  Final   Special Requests   Final    BOTTLES DRAWN AEROBIC AND ANAEROBIC Blood Culture results may not be optimal due to an inadequate volume of blood received in culture bottles   Culture   Final    NO GROWTH 5 DAYS Performed at [facility], [address] [place], [place] [address]    Report Status 07/10/2019 FINAL  Final  Aerobic/Anaerobic Culture (surgical/deep wound)     Status: None   Collection Time: 07/05/19  3:45 PM   Specimen: Fluid; Abscess  Result Value Ref Range Status   Specimen Description FLUID RIGHT PLEURAL  Final   Special Requests NONE  Final   Gram Stain   Final    RARE WBC PRESENT,BOTH PMN AND MONONUCLEAR NO ORGANISMS SEEN    Culture   Final    No growth aerobically or anaerobically. Performed at [facility], [place] [address], [place], [place] [address]    Report Status 07/10/2019 FINAL  Final  Fungus Culture With Stain     Status: None (Preliminary result)   Collection Time: 07/05/19  3:45 PM   Specimen: Pleural, Right  Result Value Ref Range Status   Fungus Stain Final report  Final    Comment: (NOTE) Performed At: [facility] [place], [place] [id number] Rush Farmer  MD [id number]    Fungus (Mycology) Culture PENDING  Incomplete   Fungal Source LUNG  Final    Comment: RIGHT Performed at [facility], [address] [place], [place] [address]   Acid Fast Smear (AFB)     Status: None   Collection Time: 07/05/19  3:45 PM   Specimen: Pleural, Right  Result Value Ref Range Status   AFB Specimen Processing Concentration  Final   Acid Fast Smear Negative  Final    Comment: (NOTE) Performed At: [facility] [address] [place], [place] [id number] Rush Farmer MD [id number]    Source (AFB) LUNG  Final    Comment: RIGHT Performed at [facility], [address] [place], [place] [address]   Fungus Culture Result     Status: None   Collection Time: 07/05/19  3:45 PM  Result Value Ref Range Status   Result 1 Comment  Final    Comment: (NOTE) KOH/Calcofluor preparation:  no fungus observed. Performed At: [facility] [place], [place] [id number] Rush Farmer MD [id number]   MRSA PCR Screening     Status: None   Collection Time: 07/05/19 11:05 PM  Result Value Ref Range Status   MRSA by PCR NEGATIVE NEGATIVE Final    Comment:        The GeneXpert MRSA Assay (FDA approved for NASAL specimens only), is one component of a comprehensive MRSA colonization surveillance program. It is not intended to diagnose MRSA infection nor to guide or monitor treatment for MRSA infections. Performed at [facility], [address], [place], [place] [address]          Radiology Studies: DG Chest 2 View  Result Date: 07/12/2019 CLINICAL DATA:  Empyema.  Chest tube present. EXAM: CHEST - 2 VIEW COMPARISON:  07/11/2019 FINDINGS: Right basilar chest tube unchanged. Lungs are somewhat hypoinflated with evidence of a small amount right pleural fluid without significant change.  No pneumothorax. Remainder of the lungs are clear. Cardiomediastinal silhouette and remainder of the exam is  unchanged. IMPRESSION: Small amount right pleural fluid with right basilar chest tube unchanged. Electronically Signed   By: Marin Olp M.D.   On: 07/12/2019 08:25   DG Chest Port 1 View  Result Date: 07/11/2019 CLINICAL DATA:  Empyema. EXAM: PORTABLE CHEST 1 VIEW COMPARISON:  Yesterday FINDINGS: Additional right-sided pleural catheter placed in the interim. There is improved aeration at the right base with decreased pleural fluid. Normal heart size. No visible pneumothorax. IMPRESSION: Improved right base aeration after additional catheter placement. Electronically Signed   By: Monte Fantasia M.D.   On: 07/11/2019 08:56        Scheduled Meds: . alteplase (tPA) 10mg  in NS 20mL for Dr.Oaks (intrapleural administration/[facility])   Intrapleural Once  . amLODipine  5 mg Oral Daily  . aspirin EC  162 mg Oral Daily  . atorvastatin  20 mg Oral Daily  . hydrochlorothiazide  25 mg Oral Daily  . irbesartan  37.5 mg Oral Daily  . loratadine  10 mg Oral Daily  . nebivolol  5 mg Oral Daily  . tamsulosin  0.8 mg Oral QPC supper   Continuous Infusions: . ampicillin-sulbactam (UNASYN) IV 3 g (07/12/19 1200)     LOS: 7 days    Time spent: 35 minutes    Sidney Ace, MD [facility] Pager [phone]  If 7PM-7AM, please contact night-coverage 07/12/2019, 1:55 PM

## 2019-07-12 NOTE — Progress Notes (Signed)
[facility] SUR[facility] PRO[facility]ESS NOTE (cpt [id number])  Hospital Day(s): 7.   Interval History: Patient seen and examined, no acute events or new complaints overnight. Patient reports he is doing well. No chest pain or SOB. Only complains of back pain at chest tube site. Chest tube output not recorded and appears to be minimal and amber colored, no purulence. No air leak. No leukocytosis. No electrolyte derangement. Renal function is normal. CXR this morning is stable.   Review of Systems:  Constitutional: denies fever, chills  HEENT: denies cough or congestion  Respiratory: denies any shortness of breath  Cardiovascular: denies chest pain or palpitations  Gastrointestinal: denies abdominal pain, N/V, or diarrhea/and bowel function as per interval history Genitourinary: denies burning with urination or urinary frequency  Vital signs in last 24 hours: [min-max] current  Temp:  [98.5 F (36.9 C)-99.4 F (37.4 C)] 98.7 F (37.1 C) (05/19 0734) Pulse Rate:  [75-80] 75 (05/19 0734) Resp:  [17-20] 17 (05/19 0734) BP: (139-149)/(62-82) 142/62 (05/19 0734) SpO2:  [94 %-98 %] 94 % (05/19 0734) Weight:  [97.5 kg] 97.5 kg (05/19 0444)     Height: 5\' 11"  (180.3 cm) Weight: 97.5 kg BMI (Calculated): 29.99   Intake/Output last 2 shifts:  05/18 0701 - 05/19 0700 In: 1310 [P.O.:840; I.V.:470] Out: 1000 [Urine:1000]   Physical Exam:  Constitutional: alert, cooperative and no distress  HENT: normocephalic without obvious abnormality  Eyes: PERRL, EOM's grossly intact and symmetric  Respiratory: breathing non-labored at rest, chest tube without air leak, output amber colored, no purulence Cardiovascular: regular rate and sinus rhythm  Musculoskeletal: no edema or wounds, motor and sensation grossly intact, NT    Labs:  CBC Latest Ref Rng & Units 07/12/2019 07/10/2019 07/09/2019  WBC 4.0 - 10.5 K/uL 8.9 8.2 10.6(H)  Hemoglobin 13.0 - 17.0 g/dL 11.8(L) 10.9(L) 11.5(L)  Hematocrit 39.0 -  52.0 % 34.4(L) 31.9(L) 35.4(L)  Platelets 150 - 400 K/uL 228 203 221   CMP Latest Ref Rng & Units 07/12/2019 07/11/2019 07/10/2019  Glucose 70 - 99 mg/dL 122(H) - 127(H)  BUN 8 - 23 mg/dL 10 - 10  Creatinine 0.61 - 1.24 mg/dL 0.60(L) - 0.49(L)  Sodium 135 - 145 mmol/L 137 - 138  Potassium 3.5 - 5.1 mmol/L 3.5 3.6 3.4(L)  Chloride 98 - 111 mmol/L 104 - 105  CO2 22 - 32 mmol/L 23 - 25  Calcium 8.9 - 10.3 mg/dL 8.6(L) - 8.4(L)  Total Protein 6.5 - 8.1 g/dL - - -  Total Bilirubin 0.3 - 1.2 mg/dL - - -  Alkaline Phos 38 - 126 U/L - - -  AST 15 - 41 U/L - - -  ALT 0 - 44 U/L - - -     Imaging studies:   CXR (07/12/2019) personally reviewed which appears to have stable right pleural effusion, chest tube in place ? Kinked, and radiologist report reviewed:  IMPRESSION: Small amount right pleural fluid with right basilar chest tube unchanged.   Assessment/Plan: (ICD-10's: J86.9) 74 y.o. male with right empyema.    - Leave chest tube to water-seal today; monitor and record outout  - Repeat CXR in the morning  - Pulmonary toilet   - pain control prn  - If CXR appears stable/improved without significant change increase or change in output will likely remove tomorrow.    - Further management per primary service; we will follow  Above plan discussed with Dr Genevive Bi.    All of the above findings and recommendations were discussed  with the patient, and the medical team, and all of patient's questions were answered to his expressed satisfaction.  -- Edison Simon, PA-C [facility] 07/12/2019, 9:55 AM [phone] M-F: 7am - 4pm

## 2019-07-12 NOTE — Progress Notes (Signed)
Pulmonary Medicine          Date: 07/12/2019,   MRN# [id number] Andrew Myers Dec 31, 1945     AdmissionWeight: 103.4 kg                 CurrentWeight: 97.5 kg   Refering physician:Dr Agbata   CHIEF COMPLAINT:   Pneumonia with Empyema   SUBJECTIVE   -Patient examined at bedside, his partner Andrew Myers is at bedside during my evaluation.   -CT#1 is removed.  CT#2 with amber colored fluid-no new drainage overnight-07/12/19    -Patient is able to take tidal volume up to 1250 cc improved from 1000 yesterday.  -reviewed care plan with patient - he wants to know if he will still need to have surgery.    PAST MEDICAL HISTORY   Past Medical History:  Diagnosis Date  . BPH (benign prostatic hyperplasia)   . CAP (community acquired pneumonia) 04/2019  . Empyema lung ([place]) 06/2019  . Hyperlipidemia   . Hypertension   . Obesity   . Pleural effusion on right 05/2019  . Skin cancer    BCC, SCC  . Sleep apnea   . Sleep apnea      SURGICAL HISTORY   Past Surgical History:  Procedure Laterality Date  . APPENDECTOMY    . CATARACT EXTRACTION    . FRACTURE SURGERY       FAMILY HISTORY   Family History  Problem Relation Age of Onset  . Breast cancer Mother   . Hypertension Mother   . Hyperlipidemia Mother   . Heart failure Mother   . Heart attack Father   . Thyroid cancer Sister      SOCIAL HISTORY   Social History   Tobacco Use  . Smoking status: Former Smoker    Quit date: 05/07/1967    Years since quitting: 52.2  . Smokeless tobacco: Never Used  Substance Use Topics  . Alcohol use: Yes    Comment: rarely  . Drug use: No     MEDICATIONS    Home Medication:    Current Medication:  Current Facility-Administered Medications:  .  acetaminophen (TYLENOL) tablet 650 mg, 650 mg, Oral, Q6H PRN, 650 mg at 07/12/19 0919 **OR** acetaminophen (TYLENOL) suppository 650 mg, 650 mg, Rectal, Q6H PRN, Agbata, Tochukwu, MD .  alteplase (tPA) 10mg   in NS 17mL for Dr.Oaks (intrapleural administration/[facility]), , Intrapleural, Once, Nestor Lewandowsky, MD .  amLODipine (NORVASC) tablet 5 mg, 5 mg, Oral, Daily, Agbata, Tochukwu, MD, 5 mg at 07/12/19 0916 .  Ampicillin-Sulbactam (UNASYN) 3 g in sodium chloride 0.9 % 100 mL IVPB, 3 g, Intravenous, Q6H, Ravishankar, Jayashree, MD, Last Rate: 200 mL/hr at 07/12/19 1200, 3 g at 07/12/19 1200 .  aspirin EC tablet 162 mg, 162 mg, Oral, Daily, Lu Duffel, RPH, 162 mg at 07/12/19 0916 .  atorvastatin (LIPITOR) tablet 20 mg, 20 mg, Oral, Daily, Agbata, Tochukwu, MD, 20 mg at 07/11/19 1744 .  hydrochlorothiazide (HYDRODIURIL) tablet 25 mg, 25 mg, Oral, Daily, Agbata, Tochukwu, MD, 25 mg at 07/12/19 0916 .  irbesartan (AVAPRO) tablet 37.5 mg, 37.5 mg, Oral, Daily, Agbata, Tochukwu, MD, 37.5 mg at 07/12/19 0916 .  loratadine (CLARITIN) tablet 10 mg, 10 mg, Oral, Daily, Agbata, Tochukwu, MD, 10 mg at 07/12/19 0916 .  nebivolol (BYSTOLIC) tablet 5 mg, 5 mg, Oral, Daily, Agbata, Tochukwu, MD, 5 mg at 07/12/19 0916 .  ondansetron (ZOFRAN) tablet 4 mg, 4 mg, Oral, Q6H PRN **OR** ondansetron (ZOFRAN) injection 4 mg,  4 mg, Intravenous, Q6H PRN, Agbata, Tochukwu, MD .  tamsulosin (FLOMAX) capsule 0.8 mg, 0.8 mg, Oral, QPC supper, Agbata, Tochukwu, MD, 0.8 mg at 07/11/19 1744    ALLERGIES   Codeine     REVIEW OF SYSTEMS    Review of Systems:  Gen:  Denies  fever, sweats, chills weigh loss  HEENT: Denies blurred vision, double vision, ear pain, eye pain, hearing loss, nose bleeds, sore throat Cardiac:  No dizziness, chest pain or heaviness, chest tightness,edema Resp:   Denies cough or sputum porduction, shortness of breath,wheezing, hemoptysis,  Gi: Denies swallowing difficulty, stomach pain, nausea or vomiting, diarrhea, constipation, bowel incontinence Gu:  Denies bladder incontinence, burning urine Ext:   Denies Joint pain, stiffness or swelling Skin: Denies  skin rash, easy bruising or bleeding  or hives Endoc:  Denies polyuria, polydipsia , polyphagia or weight change Psych:   Denies depression, insomnia or hallucinations   Other:  All other systems negative   VS: BP 136/73 (BP Location: Right Arm)   Pulse 85   Temp 97.8 F (36.6 C)   Resp 18   Ht 5\' 11"  (1.803 m)   Wt 97.5 kg   SpO2 95%   BMI 29.97 kg/m      PHYSICAL EXAM    GENERAL:NAD, no fevers, chills, no weakness no fatigue HEAD: Normocephalic, atraumatic.  EYES: Pupils equal, round, reactive to light. Extraocular muscles intact. No scleral icterus.  MOUTH: Moist mucosal membrane. Dentition intact. No abscess noted.  EAR, NOSE, THROAT: Clear without exudates. No external lesions.  NECK: Supple. No thyromegaly. No nodules. No JVD.  PULMONARY: Clear to ausculatation bilaterally with Decreased air entry on right improved from yestereday CARDIOVASCULAR: S1 and S2. Regular rate and rhythm. No murmurs, rubs, or gallops. No edema. Pedal pulses 2+ bilaterally.  GASTROINTESTINAL: Soft, nontender, nondistended. No masses. Positive bowel sounds. No hepatosplenomegaly.  MUSCULOSKELETAL: No swelling, clubbing, or edema. Range of motion full in all extremities.  NEUROLOGIC: Cranial nerves II through XII are intact. No gross focal neurological deficits. Sensation intact. Reflexes intact.  SKIN: No ulceration, lesions, rashes, or cyanosis. Skin warm and dry. Turgor intact.  PSYCHIATRIC: Mood, affect within normal limits. The patient is awake, alert and oriented x 3. Insight, judgment intact.       IMAGING    DG Chest 1 View  Result Date: 07/04/2019 CLINICAL DATA:  Status post right thoracentesis. EXAM: CHEST  1 VIEW COMPARISON:  CT of the chest on 06/29/2019 FINDINGS: The heart size and mediastinal contours are within normal limits. Residual lateral/posterolateral component of loculated pleural fluid remains on the right. No pneumothorax after right thoracentesis. No pulmonary edema. The visualized skeletal structures are  unremarkable. IMPRESSION: No pneumothorax after right sided thoracentesis. Residual lateral/posterolateral component of loculated right pleural fluid remains. Electronically Signed   By: Aletta Edouard M.D.   On: 07/04/2019 14:26   DG Chest 2 View  Result Date: 07/12/2019 CLINICAL DATA:  Empyema.  Chest tube present. EXAM: CHEST - 2 VIEW COMPARISON:  07/11/2019 FINDINGS: Right basilar chest tube unchanged. Lungs are somewhat hypoinflated with evidence of a small amount right pleural fluid without significant change. No pneumothorax. Remainder of the lungs are clear. Cardiomediastinal silhouette and remainder of the exam is unchanged. IMPRESSION: Small amount right pleural fluid with right basilar chest tube unchanged. Electronically Signed   By: Marin Olp M.D.   On: 07/12/2019 08:25   CT CHEST WO CONTRAST  Result Date: 07/07/2019 CLINICAL DATA:  Follow-up scan for empyema of the  RIGHT lung, chest tube placed 2 days ago EXAM: CT CHEST WITHOUT CONTRAST TECHNIQUE: Multidetector CT imaging of the chest was performed following the standard protocol without IV contrast. COMPARISON:  06/28/2009 and intervening interventional and ultrasound evaluations. FINDINGS: Cardiovascular: Calcified atheromatous plaque in the thoracic aorta similar to prior study. No aneurysmal dilation. Heart size is stable without pericardial effusion. Calcified coronary artery disease as before. Vessels not well assessed given lack of intravenous contrast. Mediastinum/Nodes: No thoracic inlet adenopathy. No axillary lymphadenopathy. No mediastinal adenopathy. Esophagus grossly normal. Lungs/Pleura: Since the previous examination there is been interval placement of a RIGHT-sided chest tube. A small amount of subcutaneous emphysema is noted. The marker for chest tube sideholes is at least 1.5 cm within the RIGHT chest cavity within the pleural space. Pleural fluid at the RIGHT lung base is perhaps slightly diminished but there is no  change in the loculated fluid along the fissure in the RIGHT chest when compared to the prior exam RIGHT basilar consolidative changes and volume loss are similar to the prior study. LEFT chest is clear. Airways are patent. Upper Abdomen: Upper abdominal contents are unremarkable. No acute process in the upper abdomen. Musculoskeletal: Small amount of subcutaneous emphysema along the RIGHT chest wall as discussed. No chest wall mass. No acute bone process. Spinal degenerative change. IMPRESSION: 1. Slightly diminished RIGHT pleural fluid, dominant loculated area along the major fissure in the RIGHT chest is unchanged. 2. Persistent basilar consolidation/volume loss without change. This may represent rounded atelectasis or pneumonia. Follow-up is suggested to ensure there is no underlying mass. 3. Small amount of subcutaneous emphysema along the chest wall. Marker for sideholes is within the pleural space as discussed above. 4. Atherosclerotic changes in the thoracic aorta. Calcified coronary artery disease. 5. Aortic atherosclerosis. Aortic Atherosclerosis (ICD10-I70.0). Electronically Signed   By: Zetta Bills M.D.   On: 07/07/2019 16:33   CT CHEST WO CONTRAST  Result Date: 06/29/2019 CLINICAL DATA:  Pleural effusion, shortness of breath. Follow-up chest x-ray EXAM: CT CHEST WITHOUT CONTRAST TECHNIQUE: Multidetector CT imaging of the chest was performed following the standard protocol without IV contrast. COMPARISON:  Chest x-ray 05/11/2019 FINDINGS: Cardiovascular: Heart is normal size. Aorta normal caliber. Scattered coronary artery and aortic calcifications. Mediastinum/Nodes: No mediastinal, hilar, or axillary adenopathy. Trachea and esophagus are unremarkable. Thyroid unremarkable. Lungs/Pleura: Partially loculated moderate-sized right pleural effusion noted. Compressive atelectasis or pneumonia in the right lower lobe. Scattered granulomas in the right lung. Left lung clear. Upper Abdomen: Imaging into  the upper abdomen shows no acute findings. Musculoskeletal: Chest wall soft tissues are unremarkable. No acute bony abnormality. IMPRESSION: Moderate partially loculated right pleural effusion. Right lower lobe compressive atelectasis versus pneumonia. Electronically Signed   By: Rolm Baptise M.D.   On: 06/29/2019 14:56   DG Chest Port 1 View  Result Date: 07/11/2019 CLINICAL DATA:  Empyema. EXAM: PORTABLE CHEST 1 VIEW COMPARISON:  Yesterday FINDINGS: Additional right-sided pleural catheter placed in the interim. There is improved aeration at the right base with decreased pleural fluid. Normal heart size. No visible pneumothorax. IMPRESSION: Improved right base aeration after additional catheter placement. Electronically Signed   By: Monte Fantasia M.D.   On: 07/11/2019 08:56   DG Chest Port 1 View  Result Date: 07/10/2019 CLINICAL DATA:  Atelectasis EXAM: PORTABLE CHEST 1 VIEW COMPARISON:  Portable exam at 0743 hrs compared to 07/09/2019 FINDINGS: Upper-normal size of cardiac silhouette. Mediastinal contours and pulmonary vascularity normal. Persistent RIGHT pleural effusion and basilar atelectasis. LEFT lung clear. No  acute infiltrate or pneumothorax. Endplate spur formation thoracic spine, mild. IMPRESSION: Persistent RIGHT pleural effusion and basilar atelectasis. Electronically Signed   By: Lavonia Dana M.D.   On: 07/10/2019 08:20   DG Chest Port 1 View  Result Date: 07/09/2019 CLINICAL DATA:  Empyema, RIGHT-sided chest tube in place. EXAM: PORTABLE CHEST 1 VIEW COMPARISON:  Chest x-ray dated 07/07/2019. FINDINGS: RIGHT basilar chest tube appears stable in position. The overlying RIGHT pleural fluid is stable in extent. LEFT lung remains clear. No pneumothorax is seen. Heart size and mediastinal contours are stable. IMPRESSION: Stable chest x-ray. RIGHT-sided chest tube is stable in position. The overlying RIGHT pleural fluid is stable in extent. Electronically Signed   By: Franki Cabot M.D.   On:  07/09/2019 10:10   DG Chest Port 1 View  Result Date: 07/07/2019 CLINICAL DATA:  Follow-up right chest tube. EXAM: PORTABLE CHEST 1 VIEW COMPARISON:  07/06/2018 FINDINGS: Chest tube remains in place at the right lung base. There is slightly less pleural fluid than was seen yesterday. No pleural air is visible. Some pleural fluid does persist, with right lower lung atelectasis. Left hemithorax appears clear. IMPRESSION: Persistent right chest tube. Slightly less right pleural fluid and less pleural air. Moderate amount of pleural fluid does persist with lower right lung atelectasis. Electronically Signed   By: Nelson Chimes M.D.   On: 07/07/2019 11:55   DG Chest Port 1 View  Result Date: 07/06/2019 CLINICAL DATA:  Right empyema. EXAM: PORTABLE CHEST 1 VIEW COMPARISON:  Chest x-ray dated Jul 04, 2019. FINDINGS: New right pigtail chest tube with unchanged small loculated pleural effusion. The left lung is clear. No pneumothorax. The heart size and mediastinal contours are within normal limits. Normal pulmonary vascularity. No acute osseous abnormality. IMPRESSION: 1. New right-sided chest tube with unchanged small loculated pleural effusion. No pneumothorax. Electronically Signed   By: Titus Dubin M.D.   On: 07/06/2019 09:17   CT IMAGE GUIDED DRAINAGE BY PERCUTANEOUS CATHETER  Result Date: 07/10/2019 INDICATION: 74 year old with a loculated right pleural effusion. Patient already has one right chest tube but there is residual right pleural fluid and request for a second chest tube placement. EXAM: CT-GUIDED RIGHT CHEST TUBE PLACEMENT MEDICATIONS: Moderate sedation ANESTHESIA/SEDATION: 1.0 mg IV Versed 50 mcg IV Fentanyl Moderate Sedation Time:  18 minutes The patient was continuously monitored during the procedure by the interventional radiology nurse under my direct supervision. COMPLICATIONS: None immediate. TECHNIQUE: Informed written consent was obtained from the patient after a thorough discussion  of the procedural risks, benefits and alternatives. All questions were addressed. A timeout was performed prior to the initiation of the procedure. PROCEDURE: Patient was placed prone on the CT scanner. Pocket of fluid along the posterior aspect of the right chest was identified and targeted. The right posterior chest was prepped with chlorhexidine and sterile field was created. Skin and soft tissues were anesthetized with 1% lidocaine. Using CT guidance, a Yueh catheter was directed into the pleural space and yellow fluid was aspirated. Superstiff Amplatz wire was advanced into the pleural space. The tract was dilated to accommodate a 14 [place] multipurpose drain. Small amount of additional pleural fluid was aspirated. Chest tube was attached to the drainage system. Catheter was sutured to skin and a dressing was placed. FINDINGS: Loculated right pleural fluid. Pocket along the right posterior aspect of the chest was identified and targeted for drain placement. 57 French drain was successfully placed in the right pleural space. Small amount of yellow fluid was  removed. Pocket of pleural air noted following chest tube placement compatible with the loculated nature of the pleural effusion. IMPRESSION: CT-guided placement of a right chest tube. Right pleural fluid is loculated. Electronically Signed   By: Markus Daft M.D.   On: 07/10/2019 13:35   CT IMAGE GUIDED DRAINAGE BY PERCUTANEOUS CATHETER  Result Date: 07/05/2019 INDICATION: Right empyema EXAM: CT-GUIDED PLACEMENT RIGHT 10 FRENCH CHEST TUBE MEDICATIONS: The patient is currently admitted to the hospital and receiving intravenous antibiotics. The antibiotics were administered within an appropriate time frame prior to the initiation of the procedure. ANESTHESIA/SEDATION: Fentanyl 50 mcg IV; Versed 1.0 mg IV Moderate Sedation Time:  11 MINUTES The patient was continuously monitored during the procedure by the interventional radiology nurse under my direct  supervision. COMPLICATIONS: None immediate. PROCEDURE: Informed written consent was obtained from the patient after a thorough discussion of the procedural risks, benefits and alternatives. All questions were addressed. Maximal Sterile Barrier Technique was utilized including caps, mask, sterile gowns, sterile gloves, sterile drape, hand hygiene and skin antiseptic. A timeout was performed prior to the initiation of the procedure. Previous imaging reviewed. Patient positioned right anterior oblique. Noncontrast localization CT performed. The loculated right effusion was localized and marked for a lateral posterior approach. Under sterile conditions and local anesthesia, 18 gauge introducer needle was advanced from a posterolateral lower intercostal approach into the pleural fluid. Needle position confirmed with CT. Guidewire inserted followed by tract dilatation insert a 10 French drain. Drain catheter position confirmed with CT. Syringe aspiration yielded 10 cc of thick mucinous exudative fluid. Sample sent for pan culture. Catheter secured with Prolene suture and connected to external pleura vac. Sterile dressing applied. No immediate complication. Patient tolerated the procedure well. IMPRESSION: Successful CT-guided 10 French right chest tube insertion. Electronically Signed   By: Jerilynn Mages.  Shick M.D.   On: 07/05/2019 15:44   US THORACENTESIS ASP PLEURAL SPACE W/IMG GUIDE  Result Date: 07/04/2019 CLINICAL DATA:  Loculated right pleural effusion. EXAM: ULTRASOUND GUIDED RIGHT THORACENTESIS COMPARISON:  CT of the chest on 06/29/2019 PROCEDURE: An ultrasound guided thoracentesis was thoroughly discussed with the patient and questions answered. The benefits, risks, alternatives and complications were also discussed. The patient understands and wishes to proceed with the procedure. Written consent was obtained. Ultrasound was performed to localize and mark an adequate pocket of fluid in the right chest. The area was  then prepped and draped in the normal sterile fashion. 1% Lidocaine was used for local anesthesia. Under ultrasound guidance a 6 French Safe-T-Centesis catheter was introduced. Fluid was removed from the catheter. The catheter was then removed. Under ultrasound guidance, a 5 [place] Yueh centesis catheter was then introduced in a slightly higher pocket of pleural fluid in the right posterior chest. Aspiration was performed through the centesis catheter. The catheter was removed and a dressing applied. FINDINGS: Ultrasound shows a very complex appearing and loculated right pleural effusion with relatively small components posteriorly. From the first pocket sampled in the right posterior pleural space towards the lung base, only 10 mL of clear appearing amber fluid was able to be aspirated. The sample was sent for various requested labs. Decision was made to sample a second slightly higher pocket yielding 5 mL of grossly purulent whitish green thick fluid. This sample was sent for culture analysis. IMPRESSION: Highly loculated and complex right pleural effusion with relatively small pockets posteriorly. The first pocket yielded 10 mL of clear appearing amber fluid. The second more superior pocket yielded 5 mL of grossly  purulent fluid likely consistent with empyema. Fluid analysis will be performed on both samples. Electronically Signed   By: Aletta Edouard M.D.   On: 07/04/2019 14:24      ASSESSMENT/PLAN   Community acquired Pneumonia  -present on admission - due to gram positive cocci such as streptococcus or staph aureus -s/p thoracentesis with - GPC + - appreciate Infectious disease and pharmD-vanco dcd on Unasyn -bronchopulmonary hygiene with Acapella and IS x10/h -procalcitonin <.10 -Recruitment maneuvers for atelecatatic right lung - metaneb q4h -continue PT -CXR 07/12/19- improved from previous with small residual effusion.   -continue PT/OT -significantly clinically improved   Empyema of  right pleural space  - +GPCs - s/p removal of first intrapleural catheter and palcement of second catheter with no additional dreainage -fluid studies resent today and recultured -CT surgery on case Dr Genevive Bi - intrapleural tPA ongoing - appreciate input -plan to remove CT#2 in am if no additional drainage overnight   Appreciate collaboration with all providers involved.    Patient/Family are satisfied with care plan and all questions have been answered.   This document was prepared using Dragon voice recognition software and may include unintentional dictation errors.     Ottie Glazier, M.D.  Division of [place]

## 2019-07-13 ENCOUNTER — Inpatient Hospital Stay: Payer: PPO

## 2019-07-13 NOTE — Progress Notes (Signed)
[facility] Follow Up Note  Patient ID: Shayon Ramm, male   DOB: 17-Dec-1945, 74 y.o.   MRN: [id number]  HISTORY: No new complains.  Not short of breath.    Vitals:   07/13/19 0806 07/13/19 1147  BP: (!) 151/81 (!) 148/79  Pulse: 80 81  Resp: 18 17  Temp: (!) 97.4 F (36.3 C) 97.7 F (36.5 C)  SpO2: 96% 98%     EXAM:  Resp: Lungs are clear bilaterally.  No respiratory distress, normal effort. Heart:  Regular without murmurs Abd:  Abdomen is soft, non distended and non tender. No masses are palpable.  There is no rebound and no guarding.  Neurological: Alert and oriented to person, place, and time. Coordination normal.  Skin: Skin is warm and dry. No rash noted. No diaphoretic. No erythema. No pallor.  Psychiatric: Normal mood and affect. Normal behavior. Judgment and thought content normal.   No air leak seen and no drainage from the tube   ASSESSMENT: Right empyema   PLAN:   Since the tube is not draining and TPA could not be given, we removed the chest tube without incident.  Wound care instructions given.    Nestor Lewandowsky, MDPatient ID: [name], male   DOB: [birth year], 74 y.o.   MRN: [id number]

## 2019-07-13 NOTE — Care Management Important Message (Signed)
Important Message  Patient Details  Name: Andrew Myers MRN: [id number] Date of Birth: Dec 19, 1945   Medicare Important Message Given:  Yes     Dannette Barbara 07/13/2019, 12:54 PM

## 2019-07-13 NOTE — Progress Notes (Signed)
Pulmonary Medicine          Date: 07/13/2019,   MRN# [id number] Andrew Myers [birth year]     AdmissionWeight: 103.4 kg                 CurrentWeight: 97.6 kg   Refering physician:Dr Agbata   CHIEF COMPLAINT:   Pneumonia with Empyema   SUBJECTIVE   -Patient is laying in bed in no distress.   -patient is using IS well.   Plan for outpatient augmentin - discussed side effects and to let us known if diarreah , rash  -Patient will be followed up on oupatient post d/c - reviewed symptoms of recurrence of empyema including sob/dyspena, CP, desaturation of oxygen, fevers  -continue to ambulate while hospitalized  PAST MEDICAL HISTORY   Past Medical History:  Diagnosis Date   BPH (benign prostatic hyperplasia)    CAP (community acquired pneumonia) 04/2019   Empyema lung ([place]) 06/2019   Hyperlipidemia    Hypertension    Obesity    Pleural effusion on right 05/2019   Skin cancer    BCC, SCC   Sleep apnea    Sleep apnea      SURGICAL HISTORY   Past Surgical History:  Procedure Laterality Date   APPENDECTOMY     CATARACT EXTRACTION     FRACTURE SURGERY       FAMILY HISTORY   Family History  Problem Relation Age of Onset   Breast cancer Mother    Hypertension Mother    Hyperlipidemia Mother    Heart failure Mother    Heart attack Father    Thyroid cancer Sister      SOCIAL HISTORY   Social History   Tobacco Use   Smoking status: Former Smoker    Quit date: 05/07/1967    Years since quitting: 52.2   Smokeless tobacco: Never Used  Substance Use Topics   Alcohol use: Yes    Comment: rarely   Drug use: No     MEDICATIONS    Home Medication:    Current Medication:  Current Facility-Administered Medications:    acetaminophen (TYLENOL) tablet 650 mg, 650 mg, Oral, Q6H PRN, 650 mg at 07/13/19 0901 **OR** acetaminophen (TYLENOL) suppository 650 mg, 650 mg, Rectal, Q6H PRN, Agbata, Tochukwu, MD    alteplase (tPA) 10mg  in NS 29mL for Dr.Oaks (intrapleural administration/[facility]), , Intrapleural, Once, Nestor Lewandowsky, MD   amLODipine (NORVASC) tablet 5 mg, 5 mg, Oral, Daily, Agbata, Tochukwu, MD, 5 mg at 07/13/19 0901   Ampicillin-Sulbactam (UNASYN) 3 g in sodium chloride 0.9 % 100 mL IVPB, 3 g, Intravenous, Q6H, Ravishankar, Jayashree, MD, Last Rate: 200 mL/hr at 07/13/19 1716, 3 g at 07/13/19 1716   aspirin EC tablet 162 mg, 162 mg, Oral, Daily, Lu Duffel, RPH, 162 mg at 07/13/19 0901   atorvastatin (LIPITOR) tablet 20 mg, 20 mg, Oral, Daily, Agbata, Tochukwu, MD, 20 mg at 07/13/19 1716   hydrochlorothiazide (HYDRODIURIL) tablet 25 mg, 25 mg, Oral, Daily, Agbata, Tochukwu, MD, 25 mg at 07/13/19 0901   irbesartan (AVAPRO) tablet 37.5 mg, 37.5 mg, Oral, Daily, Agbata, Tochukwu, MD, 37.5 mg at 07/13/19 0902   loratadine (CLARITIN) tablet 10 mg, 10 mg, Oral, Daily, Agbata, Tochukwu, MD, 10 mg at 07/13/19 0901   nebivolol (BYSTOLIC) tablet 5 mg, 5 mg, Oral, Daily, Agbata, Tochukwu, MD, 5 mg at 07/13/19 0901   ondansetron (ZOFRAN) tablet 4 mg, 4 mg, Oral, Q6H PRN **OR** ondansetron (ZOFRAN) injection 4 mg, 4 mg,  Intravenous, Q6H PRN, Agbata, Tochukwu, MD   tamsulosin (FLOMAX) capsule 0.8 mg, 0.8 mg, Oral, QPC supper, Agbata, Tochukwu, MD, 0.8 mg at 07/13/19 1716    ALLERGIES   Codeine     REVIEW OF SYSTEMS    Review of Systems:  Gen:  Denies  fever, sweats, chills weigh loss  HEENT: Denies blurred vision, double vision, ear pain, eye pain, hearing loss, nose bleeds, sore throat Cardiac:  No dizziness, chest pain or heaviness, chest tightness,edema Resp:   Denies cough or sputum porduction, shortness of breath,wheezing, hemoptysis,  Gi: Denies swallowing difficulty, stomach pain, nausea or vomiting, diarrhea, constipation, bowel incontinence Gu:  Denies bladder incontinence, burning urine Ext:   Denies Joint pain, stiffness or swelling Skin: Denies  skin rash, easy  bruising or bleeding or hives Endoc:  Denies polyuria, polydipsia , polyphagia or weight change Psych:   Denies depression, insomnia or hallucinations   Other:  All other systems negative   VS: BP (!) 148/76 (BP Location: Right Arm)    Pulse 83    Temp 97.7 F (36.5 C) (Oral)    Resp 18    Ht 5\' 11"  (1.803 m)    Wt 97.6 kg    SpO2 97%    BMI 30.00 kg/m      PHYSICAL EXAM    GENERAL:NAD, no fevers, chills, no weakness no fatigue HEAD: Normocephalic, atraumatic.  EYES: Pupils equal, round, reactive to light. Extraocular muscles intact. No scleral icterus.  MOUTH: Moist mucosal membrane. Dentition intact. No abscess noted.  EAR, NOSE, THROAT: Clear without exudates. No external lesions.  NECK: Supple. No thyromegaly. No nodules. No JVD.  PULMONARY: Clear to ausculatation bilaterally with Decreased air entry on right CARDIOVASCULAR: S1 and S2. Regular rate and rhythm. No murmurs, rubs, or gallops. No edema. Pedal pulses 2+ bilaterally.  GASTROINTESTINAL: Soft, nontender, nondistended. No masses. Positive bowel sounds. No hepatosplenomegaly.  MUSCULOSKELETAL: No swelling, clubbing, or edema. Range of motion full in all extremities.  NEUROLOGIC: Cranial nerves II through XII are intact. No gross focal neurological deficits. Sensation intact. Reflexes intact.  SKIN: No ulceration, lesions, rashes, or cyanosis. Skin warm and dry. Turgor intact.  PSYCHIATRIC: Mood, affect within normal limits. The patient is awake, alert and oriented x 3. Insight, judgment intact.       IMAGING    DG Chest 1 View  Result Date: 07/04/2019 CLINICAL DATA:  Status post right thoracentesis. EXAM: CHEST  1 VIEW COMPARISON:  CT of the chest on 06/29/2019 FINDINGS: The heart size and mediastinal contours are within normal limits. Residual lateral/posterolateral component of loculated pleural fluid remains on the right. No pneumothorax after right thoracentesis. No pulmonary edema. The visualized skeletal  structures are unremarkable. IMPRESSION: No pneumothorax after right sided thoracentesis. Residual lateral/posterolateral component of loculated right pleural fluid remains. Electronically Signed   By: Aletta Edouard M.D.   On: 07/04/2019 14:26   DG Chest 2 View  Result Date: 07/12/2019 CLINICAL DATA:  Empyema.  Chest tube present. EXAM: CHEST - 2 VIEW COMPARISON:  07/11/2019 FINDINGS: Right basilar chest tube unchanged. Lungs are somewhat hypoinflated with evidence of a small amount right pleural fluid without significant change. No pneumothorax. Remainder of the lungs are clear. Cardiomediastinal silhouette and remainder of the exam is unchanged. IMPRESSION: Small amount right pleural fluid with right basilar chest tube unchanged. Electronically Signed   By: Marin Olp M.D.   On: 07/12/2019 08:25   CT CHEST WO CONTRAST  Result Date: 07/07/2019 CLINICAL DATA:  Follow-up scan  for empyema of the RIGHT lung, chest tube placed 2 days ago EXAM: CT CHEST WITHOUT CONTRAST TECHNIQUE: Multidetector CT imaging of the chest was performed following the standard protocol without IV contrast. COMPARISON:  06/28/2009 and intervening interventional and ultrasound evaluations. FINDINGS: Cardiovascular: Calcified atheromatous plaque in the thoracic aorta similar to prior study. No aneurysmal dilation. Heart size is stable without pericardial effusion. Calcified coronary artery disease as before. Vessels not well assessed given lack of intravenous contrast. Mediastinum/Nodes: No thoracic inlet adenopathy. No axillary lymphadenopathy. No mediastinal adenopathy. Esophagus grossly normal. Lungs/Pleura: Since the previous examination there is been interval placement of a RIGHT-sided chest tube. A small amount of subcutaneous emphysema is noted. The marker for chest tube sideholes is at least 1.5 cm within the RIGHT chest cavity within the pleural space. Pleural fluid at the RIGHT lung base is perhaps slightly diminished but  there is no change in the loculated fluid along the fissure in the RIGHT chest when compared to the prior exam RIGHT basilar consolidative changes and volume loss are similar to the prior study. LEFT chest is clear. Airways are patent. Upper Abdomen: Upper abdominal contents are unremarkable. No acute process in the upper abdomen. Musculoskeletal: Small amount of subcutaneous emphysema along the RIGHT chest wall as discussed. No chest wall mass. No acute bone process. Spinal degenerative change. IMPRESSION: 1. Slightly diminished RIGHT pleural fluid, dominant loculated area along the major fissure in the RIGHT chest is unchanged. 2. Persistent basilar consolidation/volume loss without change. This may represent rounded atelectasis or pneumonia. Follow-up is suggested to ensure there is no underlying mass. 3. Small amount of subcutaneous emphysema along the chest wall. Marker for sideholes is within the pleural space as discussed above. 4. Atherosclerotic changes in the thoracic aorta. Calcified coronary artery disease. 5. Aortic atherosclerosis. Aortic Atherosclerosis (ICD10-I70.0). Electronically Signed   By: Zetta Bills M.D.   On: 07/07/2019 16:33   CT CHEST WO CONTRAST  Result Date: 06/29/2019 CLINICAL DATA:  Pleural effusion, shortness of breath. Follow-up chest x-ray EXAM: CT CHEST WITHOUT CONTRAST TECHNIQUE: Multidetector CT imaging of the chest was performed following the standard protocol without IV contrast. COMPARISON:  Chest x-ray 05/11/2019 FINDINGS: Cardiovascular: Heart is normal size. Aorta normal caliber. Scattered coronary artery and aortic calcifications. Mediastinum/Nodes: No mediastinal, hilar, or axillary adenopathy. Trachea and esophagus are unremarkable. Thyroid unremarkable. Lungs/Pleura: Partially loculated moderate-sized right pleural effusion noted. Compressive atelectasis or pneumonia in the right lower lobe. Scattered granulomas in the right lung. Left lung clear. Upper Abdomen:  Imaging into the upper abdomen shows no acute findings. Musculoskeletal: Chest wall soft tissues are unremarkable. No acute bony abnormality. IMPRESSION: Moderate partially loculated right pleural effusion. Right lower lobe compressive atelectasis versus pneumonia. Electronically Signed   By: Rolm Baptise M.D.   On: 06/29/2019 14:56   DG Chest Port 1 View  Result Date: 07/13/2019 CLINICAL DATA:  Empyema with chest tube present EXAM: PORTABLE CHEST 1 VIEW COMPARISON:  Jul 12, 2019 FINDINGS: Stable chest tube positioning on the right. No pneumothorax. Small right pleural effusion with right base atelectasis present. Lungs elsewhere clear. Heart size and pulmonary vascularity are normal. No adenopathy. There is degenerative change in the thoracic spine. IMPRESSION: Stable chest tube positioning without pneumothorax. Small right pleural effusion with right base atelectasis present. Lungs elsewhere clear. Stable cardiac silhouette. Electronically Signed   By: Lowella Grip III M.D.   On: 07/13/2019 09:31   DG Chest Port 1 View  Result Date: 07/11/2019 CLINICAL DATA:  Empyema. EXAM: PORTABLE  CHEST 1 VIEW COMPARISON:  Yesterday FINDINGS: Additional right-sided pleural catheter placed in the interim. There is improved aeration at the right base with decreased pleural fluid. Normal heart size. No visible pneumothorax. IMPRESSION: Improved right base aeration after additional catheter placement. Electronically Signed   By: Monte Fantasia M.D.   On: 07/11/2019 08:56   DG Chest Port 1 View  Result Date: 07/10/2019 CLINICAL DATA:  Atelectasis EXAM: PORTABLE CHEST 1 VIEW COMPARISON:  Portable exam at 0743 hrs compared to 07/09/2019 FINDINGS: Upper-normal size of cardiac silhouette. Mediastinal contours and pulmonary vascularity normal. Persistent RIGHT pleural effusion and basilar atelectasis. LEFT lung clear. No acute infiltrate or pneumothorax. Endplate spur formation thoracic spine, mild. IMPRESSION:  Persistent RIGHT pleural effusion and basilar atelectasis. Electronically Signed   By: Lavonia Dana M.D.   On: 07/10/2019 08:20   DG Chest Port 1 View  Result Date: 07/09/2019 CLINICAL DATA:  Empyema, RIGHT-sided chest tube in place. EXAM: PORTABLE CHEST 1 VIEW COMPARISON:  Chest x-ray dated 07/07/2019. FINDINGS: RIGHT basilar chest tube appears stable in position. The overlying RIGHT pleural fluid is stable in extent. LEFT lung remains clear. No pneumothorax is seen. Heart size and mediastinal contours are stable. IMPRESSION: Stable chest x-ray. RIGHT-sided chest tube is stable in position. The overlying RIGHT pleural fluid is stable in extent. Electronically Signed   By: Franki Cabot M.D.   On: 07/09/2019 10:10   DG Chest Port 1 View  Result Date: 07/07/2019 CLINICAL DATA:  Follow-up right chest tube. EXAM: PORTABLE CHEST 1 VIEW COMPARISON:  07/06/2018 FINDINGS: Chest tube remains in place at the right lung base. There is slightly less pleural fluid than was seen yesterday. No pleural air is visible. Some pleural fluid does persist, with right lower lung atelectasis. Left hemithorax appears clear. IMPRESSION: Persistent right chest tube. Slightly less right pleural fluid and less pleural air. Moderate amount of pleural fluid does persist with lower right lung atelectasis. Electronically Signed   By: Nelson Chimes M.D.   On: 07/07/2019 11:55   DG Chest Port 1 View  Result Date: 07/06/2019 CLINICAL DATA:  Right empyema. EXAM: PORTABLE CHEST 1 VIEW COMPARISON:  Chest x-ray dated Jul 04, 2019. FINDINGS: New right pigtail chest tube with unchanged small loculated pleural effusion. The left lung is clear. No pneumothorax. The heart size and mediastinal contours are within normal limits. Normal pulmonary vascularity. No acute osseous abnormality. IMPRESSION: 1. New right-sided chest tube with unchanged small loculated pleural effusion. No pneumothorax. Electronically Signed   By: Titus Dubin M.D.   On:  07/06/2019 09:17   CT IMAGE GUIDED DRAINAGE BY PERCUTANEOUS CATHETER  Result Date: 07/10/2019 INDICATION: 74 year old with a loculated right pleural effusion. Patient already has one right chest tube but there is residual right pleural fluid and request for a second chest tube placement. EXAM: CT-GUIDED RIGHT CHEST TUBE PLACEMENT MEDICATIONS: Moderate sedation ANESTHESIA/SEDATION: 1.0 mg IV Versed 50 mcg IV Fentanyl Moderate Sedation Time:  18 minutes The patient was continuously monitored during the procedure by the interventional radiology nurse under my direct supervision. COMPLICATIONS: None immediate. TECHNIQUE: Informed written consent was obtained from the patient after a thorough discussion of the procedural risks, benefits and alternatives. All questions were addressed. A timeout was performed prior to the initiation of the procedure. PROCEDURE: Patient was placed prone on the CT scanner. Pocket of fluid along the posterior aspect of the right chest was identified and targeted. The right posterior chest was prepped with chlorhexidine and sterile field was created. Skin  and soft tissues were anesthetized with 1% lidocaine. Using CT guidance, a Yueh catheter was directed into the pleural space and yellow fluid was aspirated. Superstiff Amplatz wire was advanced into the pleural space. The tract was dilated to accommodate a 14 [place] multipurpose drain. Small amount of additional pleural fluid was aspirated. Chest tube was attached to the drainage system. Catheter was sutured to skin and a dressing was placed. FINDINGS: Loculated right pleural fluid. Pocket along the right posterior aspect of the chest was identified and targeted for drain placement. 39 French drain was successfully placed in the right pleural space. Small amount of yellow fluid was removed. Pocket of pleural air noted following chest tube placement compatible with the loculated nature of the pleural effusion. IMPRESSION: CT-guided  placement of a right chest tube. Right pleural fluid is loculated. Electronically Signed   By: Markus Daft M.D.   On: 07/10/2019 13:35   CT IMAGE GUIDED DRAINAGE BY PERCUTANEOUS CATHETER  Result Date: 07/05/2019 INDICATION: Right empyema EXAM: CT-GUIDED PLACEMENT RIGHT 10 FRENCH CHEST TUBE MEDICATIONS: The patient is currently admitted to the hospital and receiving intravenous antibiotics. The antibiotics were administered within an appropriate time frame prior to the initiation of the procedure. ANESTHESIA/SEDATION: Fentanyl 50 mcg IV; Versed 1.0 mg IV Moderate Sedation Time:  11 MINUTES The patient was continuously monitored during the procedure by the interventional radiology nurse under my direct supervision. COMPLICATIONS: None immediate. PROCEDURE: Informed written consent was obtained from the patient after a thorough discussion of the procedural risks, benefits and alternatives. All questions were addressed. Maximal Sterile Barrier Technique was utilized including caps, mask, sterile gowns, sterile gloves, sterile drape, hand hygiene and skin antiseptic. A timeout was performed prior to the initiation of the procedure. Previous imaging reviewed. Patient positioned right anterior oblique. Noncontrast localization CT performed. The loculated right effusion was localized and marked for a lateral posterior approach. Under sterile conditions and local anesthesia, 18 gauge introducer needle was advanced from a posterolateral lower intercostal approach into the pleural fluid. Needle position confirmed with CT. Guidewire inserted followed by tract dilatation insert a 10 French drain. Drain catheter position confirmed with CT. Syringe aspiration yielded 10 cc of thick mucinous exudative fluid. Sample sent for pan culture. Catheter secured with Prolene suture and connected to external pleura vac. Sterile dressing applied. No immediate complication. Patient tolerated the procedure well. IMPRESSION: Successful  CT-guided 10 French right chest tube insertion. Electronically Signed   By: Jerilynn Mages.  Shick M.D.   On: 07/05/2019 15:44   US THORACENTESIS ASP PLEURAL SPACE W/IMG GUIDE  Result Date: 07/04/2019 CLINICAL DATA:  Loculated right pleural effusion. EXAM: ULTRASOUND GUIDED RIGHT THORACENTESIS COMPARISON:  CT of the chest on 06/29/2019 PROCEDURE: An ultrasound guided thoracentesis was thoroughly discussed with the patient and questions answered. The benefits, risks, alternatives and complications were also discussed. The patient understands and wishes to proceed with the procedure. Written consent was obtained. Ultrasound was performed to localize and mark an adequate pocket of fluid in the right chest. The area was then prepped and draped in the normal sterile fashion. 1% Lidocaine was used for local anesthesia. Under ultrasound guidance a 6 French Safe-T-Centesis catheter was introduced. Fluid was removed from the catheter. The catheter was then removed. Under ultrasound guidance, a 5 [place] Yueh centesis catheter was then introduced in a slightly higher pocket of pleural fluid in the right posterior chest. Aspiration was performed through the centesis catheter. The catheter was removed and a dressing applied. FINDINGS: Ultrasound shows a very  complex appearing and loculated right pleural effusion with relatively small components posteriorly. From the first pocket sampled in the right posterior pleural space towards the lung base, only 10 mL of clear appearing amber fluid was able to be aspirated. The sample was sent for various requested labs. Decision was made to sample a second slightly higher pocket yielding 5 mL of grossly purulent whitish green thick fluid. This sample was sent for culture analysis. IMPRESSION: Highly loculated and complex right pleural effusion with relatively small pockets posteriorly. The first pocket yielded 10 mL of clear appearing amber fluid. The second more superior pocket yielded 5 mL of  grossly purulent fluid likely consistent with empyema. Fluid analysis will be performed on both samples. Electronically Signed   By: Aletta Edouard M.D.   On: 07/04/2019 14:24      ASSESSMENT/PLAN   Community acquired Pneumonia  -present on admission - due to gram positive cocci such as streptococcus or staph aureus - patient has had numerous courses of antibiotics on outpatient basis- higher risk for resistant bacteria -s/p thoracentesis with - GPC + - appreciate Infectious disease and pharmD-vanco dcd -procalcitonin trending -MRSA nasal PCR  -bronchopulmonary hygiene with Acapella and IS x10/h -procalcitonin <.10 -Recruitment maneuvers for atelecatatic right lung - metaneb q4h -continue PT -CXR 07/12/19- improved from previous   Empyema of right pleural space  - +GPCs -fluid studies resent today and recultured -CT surgery on case Dr Genevive Bi -removed chest tubes.     Appreciate collaboration from all providers.    Thank you for allowing me to participate in the care of this patient.  .   Patient/Family are satisfied with care plan and all questions have been answered.    This document was prepared using Dragon voice recognition software and may include unintentional dictation errors.     Ottie Glazier, M.D.  Division of [place]

## 2019-07-13 NOTE — Progress Notes (Signed)
PROGRESS NOTE    Andrew Myers  [name] DOB: [birth year] DOA: 07/05/2019 PCP: Derinda Late, MD    Brief Narrative: HPI: Andrew Lade Wilsonis a 74 y.o.malewith medical history significant ofrecent pna, htn, hyperglycemia, obesity, sleep apneasent to ED by pulmonology for treatment of empyema. Patient reported in March of this year he was treatedfor LLL infiltrate.He completed 3rounds antibiotics and steroids. By 4/29 he remained symptomatic. Associated symptoms include intermittent fevers with max temp 99.9, intermittent right chest stabbing pain worse with deep breath some mild shortness of breath and general malaise. He denies headache dizziness syncope or near syncope. He denies chest pain palpitations lower extremity edema. He denies abdominal pain nausea vomiting diarrhea constipation melena bright red blood per rectum. He denies any dysuria hematuria frequency or urgency. CT chest revealed pleural effusion. Underwent thoracentesis 07/04/19 yielding 56ml fluid. Fluid studies revealedgram +bacteria concerning for empyema. Patient was called at home and instructed to come to ED for admission.  5/19: Patient seen and examined.  Overall doing well.  Second chest tube removed per CTS yesterday.  The remaining chest tube CTS was unable to instill TPA through.  Concern for kinked tube.  Patient relatively asymptomatic at this time.  Pain well controlled.  No fevers over interval.  5/20: Patient seen and examined.  Doing well.  Ambulating without difficulty.  Remains on room air.  One chest tube remains in place.  Repeat x-ray ordered for this morning.  Pain controlled.  No fevers over interval.  Assessment & Plan:   Principal Problem:   Empyema ([place]) Active Problems:   Hypertension   Hyperlipidemia   BPH (benign prostatic hyperplasia)   Hypokalemia   Obesity   Sleep apnea   Hyperglycemia   Empyema of right pleural space (HCC)  Rt Empyema  Patient  underwent a thoracentesis May 11 and fluid studies positive for gram-positive bacteria. He was instructed to come to the emergency department for admission/treatment. Patient now status post removal of 1 chest tube on 07/11/2019 Remaining chest tube CTS was unable to instill TPA Cardiothoracic surgery following for chest tube management  Plan: Continue Unasyn for now.  Per ID can transition to Augmentin at time of discharge CTS recommendations for chest tube management Every morning chest x-ray Pulmonary toileting Pain control as needed From medical standpoint patient is ready for discharge.  Awaiting further recommendations from surgical services in regards to chest tube management.  Hypokalemia Mild. Likely related to diuretic Replace as needed  Hypertension  continue  amlodipine, hydrochlorothiazide, Bystolic, valsartan.  Sleep apnea uses home BiPAP. -Respiratory consult fordevice  BPH  Stable at baseline. -Continue home meds  Obesity BMI 32. -nutritional consult  Hyperlipidemia Continue statins   DVT prophylaxis: SCDs Code Status: Full Family Communication: None today, offered to call, patient declined Disposition Plan: Status is: Inpatient  Remains inpatient appropriate because:Inpatient level of care appropriate due to severity of illness   Dispo: The patient is from: Home              Anticipated d/c is to: Home              Anticipated d/c date is: 1 day              Patient currently is not medically stable to d/c.   Chest tube remains in place.  Consultants:   ID, CTS, pulmonary  Procedures:   Chest tube  Antimicrobials:   Unasyn   Subjective: Patient seen and examined Pain well controlled No complaints  Objective: Vitals:   07/12/19 1924 07/13/19 0626 07/13/19 0806 07/13/19 1147  BP: (!) 152/65 (!) 141/69 (!) 151/81 (!) 148/79  Pulse: 81 79 80 81  Resp: 16 18 18 17   Temp: 98.1 F (36.7 C) 98.3 F (36.8 C) (!) 97.4 F  (36.3 C) 97.7 F (36.5 C)  TempSrc:  Oral Oral Oral  SpO2: 97% 95% 96% 98%  Weight:  97.6 kg    Height:        Intake/Output Summary (Last 24 hours) at 07/13/2019 1235 Last data filed at 07/13/2019 0945 Gross per 24 hour  Intake 600 ml  Output 0 ml  Net 600 ml   Filed Weights   07/11/19 0359 07/12/19 0444 07/13/19 0626  Weight: 98.1 kg 97.5 kg 97.6 kg    Examination:  General exam: Appears calm and comfortable  Respiratory system: Left-sided pigtail catheter.  Lung sounds decreased on right Cardiovascular system: S1 & S2 heard, RRR. No JVD, murmurs, rubs, gallops or clicks. No pedal edema. Gastrointestinal system: Abdomen is nondistended, soft and nontender. No organomegaly or masses felt. Normal bowel sounds heard. Central nervous system: Alert and oriented. No focal neurological deficits. Extremities: Symmetric 5 x 5 power. Skin: No rashes, lesions or ulcers Psychiatry: Judgement and insight appear normal. Mood & affect appropriate.     Data Reviewed: I have personally reviewed following labs and imaging studies  CBC: Recent Labs  Lab 07/07/19 0438 07/09/19 0616 07/10/19 0622 07/12/19 0520  WBC 12.3* 10.6* 8.2 8.9  NEUTROABS  --  7.9*  --  6.4  HGB 12.1* 11.5* 10.9* 11.8*  HCT 37.0* 35.4* 31.9* 34.4*  MCV 86.7 86.1 83.3 83.3  PLT 261 221 203 [id number]   Basic Metabolic Panel: Recent Labs  Lab 07/07/19 0438 07/10/19 0622 07/11/19 0533 07/12/19 0520  NA 138 138  --  137  K 3.8 3.4* 3.6 3.5  CL 104 105  --  104  CO2 25 25  --  23  GLUCOSE 152* 127*  --  122*  BUN 8 10  --  10  CREATININE 0.65 0.49*  --  0.60*  CALCIUM 8.5* 8.4*  --  8.6*   GFR: Estimated Creatinine Clearance: 99.4 mL/min (A) (by C-G formula based on SCr of 0.6 mg/dL (L)). Liver Function Tests: No results for input(s): AST, ALT, ALKPHOS, BILITOT, PROT, ALBUMIN in the last 168 hours. No results for input(s): LIPASE, AMYLASE in the last 168 hours. No results for input(s): AMMONIA in the last  168 hours. Coagulation Profile: No results for input(s): INR, PROTIME in the last 168 hours. Cardiac Enzymes: No results for input(s): CKTOTAL, CKMB, CKMBINDEX, TROPONINI in the last 168 hours. BNP (last 3 results) No results for input(s): PROBNP in the last 8760 hours. HbA1C: No results for input(s): HGBA1C in the last 72 hours. CBG: No results for input(s): GLUCAP in the last 168 hours. Lipid Profile: No results for input(s): CHOL, HDL, LDLCALC, TRIG, CHOLHDL, LDLDIRECT in the last 72 hours. Thyroid Function Tests: No results for input(s): TSH, T4TOTAL, FREET4, T3FREE, THYROIDAB in the last 72 hours. Anemia Panel: No results for input(s): VITAMINB12, FOLATE, FERRITIN, TIBC, IRON, RETICCTPCT in the last 72 hours. Sepsis Labs: Recent Labs  Lab 07/07/19 0438  PROCALCITON <0.10    Recent Results (from the past 240 hour(s))  SARS Coronavirus 2 by RT PCR (hospital order, performed in [facility] hospital lab) Nasopharyngeal Nasopharyngeal Swab     Status: None   Collection Time: 07/04/19 10:41 AM   Specimen: Nasopharyngeal Swab  Result Value Ref Range Status   SARS Coronavirus 2 NEGATIVE NEGATIVE Final    Comment: (NOTE) SARS-CoV-2 target nucleic acids are NOT DETECTED. The SARS-CoV-2 RNA is generally detectable in upper and lower respiratory specimens during the acute phase of infection. The lowest concentration of SARS-CoV-2 viral copies this assay can detect is 250 copies / mL. A negative result does not preclude SARS-CoV-2 infection and should not be used as the sole basis for treatment or other patient management decisions.  A negative result may occur with improper specimen collection / handling, submission of specimen other than nasopharyngeal swab, presence of viral mutation(s) within the areas targeted by this assay, and inadequate number of viral copies (<250 copies / mL). A negative result must be combined with clinical observations, patient history, and  epidemiological information. Fact Sheet for Patients:   StrictlyIdeas.no Fact Sheet for Healthcare Providers: BankingDealers.co.za This test is not yet approved or cleared  by the [place] FDA and has been authorized for detection and/or diagnosis of SARS-CoV-2 by FDA under an Emergency Use Authorization (EUA).  This EUA will remain in effect (meaning this test can be used) for the duration of the COVID-19 declaration under Section 564(b)(1) of the Act, 21 U.S.C. section 360bbb-3(b)(1), unless the authorization is terminated or revoked sooner. Performed at [facility], [address] [place], [place] [address]   Body fluid culture     Status: None   Collection Time: 07/04/19  1:00 PM   Specimen: PATH Cytology Pleural fluid  Result Value Ref Range Status   Specimen Description   Final    PLEURAL Performed at [facility], [address] [place], [place] [address]    Special Requests   Final    NONE Performed at [facility], [address] [place], [place] [address]    Gram Stain   Final    FEW WBC PRESENT,BOTH PMN AND MONONUCLEAR FEW GRAM POSITIVE COCCI    Culture   Final    NO GROWTH 3 DAYS Performed at [facility], [address], [place], [place] [address]    Report Status 07/08/2019 FINAL  Final  Fungus Culture With Stain     Status: Abnormal (Preliminary result)   Collection Time: 07/04/19  1:00 PM   Specimen: PATH Cytology Pleural fluid  Result Value Ref Range Status   Fungus Stain Final report (A)  Final    Comment: (NOTE) Performed At: [facility] [place], [place] [id number] Rush Farmer MD [id number]    Fungus (Mycology) Culture PENDING  Incomplete   Fungal Source PLEURAL  Final    Comment: Performed at [facility], [address] [place], [place] [address]  Acid Fast Smear (AFB)     Status: None   Collection Time: 07/04/19   1:00 PM   Specimen: PATH Cytology Pleural fluid  Result Value Ref Range Status   AFB Specimen Processing Concentration  Final   Acid Fast Smear Negative  Final    Comment: (NOTE) Performed At: [facility] [place], [place] [id number] Rush Farmer MD [id number]    Source (AFB) PLEURAL  Final    Comment: Performed at [facility], [address] [place], [place] [address]  Fungus Culture Result     Status: Abnormal   Collection Time: 07/04/19  1:00 PM  Result Value Ref Range Status   Result 1 Yeast observed (A)  Final    Comment: (NOTE) Performed At: [place], [place] [id number] Rush Farmer MD [id number]   Blood culture (routine x 2)     Status: None   Collection Time: 07/05/19  1:14 PM   Specimen: BLOOD  Result Value Ref Range Status   Specimen Description BLOOD LEFT ANTECUBITAL  Final   Special Requests   Final    BOTTLES DRAWN AEROBIC AND ANAEROBIC Blood Culture results may not be optimal due to an excessive volume of blood received in culture bottles   Culture   Final    NO GROWTH 5 DAYS Performed at [facility] [address] [place], [place] [address]    Report Status 07/10/2019 FINAL  Final  Blood culture (routine x 2)     Status: None   Collection Time: 07/05/19  1:14 PM   Specimen: BLOOD  Result Value Ref Range Status   Specimen Description BLOOD RIGHT ANTECUBITAL  Final   Special Requests   Final    BOTTLES DRAWN AEROBIC AND ANAEROBIC Blood Culture results may not be optimal due to an inadequate volume of blood received in culture bottles   Culture   Final    NO GROWTH 5 DAYS Performed at [facility], [address] [place], [place] [address]    Report Status 07/10/2019 FINAL  Final  Aerobic/Anaerobic Culture (surgical/deep wound)     Status: None   Collection Time: 07/05/19  3:45 PM   Specimen: Fluid; Abscess  Result Value Ref Range Status   Specimen  Description FLUID RIGHT PLEURAL  Final   Special Requests NONE  Final   Gram Stain   Final    RARE WBC PRESENT,BOTH PMN AND MONONUCLEAR NO ORGANISMS SEEN    Culture   Final    No growth aerobically or anaerobically. Performed at [facility], [place] [address] [place], [place] [address]    Report Status 07/10/2019 FINAL  Final  Fungus Culture With Stain     Status: None (Preliminary result)   Collection Time: 07/05/19  3:45 PM   Specimen: Pleural, Right  Result Value Ref Range Status   Fungus Stain Final report  Final    Comment: (NOTE) Performed At: [facility] [place], [place] [id number] Rush Farmer MD [id number]    Fungus (Mycology) Culture PENDING  Incomplete   Fungal Source LUNG  Final    Comment: RIGHT Performed at [facility], [address] [place], [place] [address]   Acid Fast Smear (AFB)     Status: None   Collection Time: 07/05/19  3:45 PM   Specimen: Pleural, Right  Result Value Ref Range Status   AFB Specimen Processing Concentration  Final   Acid Fast Smear Negative  Final    Comment: (NOTE) Performed At: [facility] [address] [place], [place] [id number] Rush Farmer MD [id number]    Source (AFB) LUNG  Final    Comment: RIGHT Performed at [facility], [address] [place],  [address]   Fungus Culture Result     Status: None   Collection Time: 07/05/19  3:45 PM  Result Value Ref Range Status   Result 1 Comment  Final    Comment: (NOTE) KOH/Calcofluor preparation:  no fungus observed. Performed At: [facility] [place], [place] [id number] Rush Farmer MD [id number]   MRSA PCR Screening     Status: None   Collection Time: 07/05/19 11:05 PM  Result Value Ref Range Status   MRSA by PCR NEGATIVE NEGATIVE  Final    Comment:        The GeneXpert MRSA Assay (FDA approved for NASAL specimens only), is one component of a comprehensive MRSA  colonization surveillance program. It is not intended to diagnose MRSA infection nor to guide or monitor treatment for MRSA infections. Performed at [facility], [address] [place], [place] [address]          Radiology Studies: DG Chest 2 View  Result Date: 07/12/2019 CLINICAL DATA:  Empyema.  Chest tube present. EXAM: CHEST - 2 VIEW COMPARISON:  07/11/2019 FINDINGS: Right basilar chest tube unchanged. Lungs are somewhat hypoinflated with evidence of a small amount right pleural fluid without significant change. No pneumothorax. Remainder of the lungs are clear. Cardiomediastinal silhouette and remainder of the exam is unchanged. IMPRESSION: Small amount right pleural fluid with right basilar chest tube unchanged. Electronically Signed   By: Marin Olp M.D.   On: 07/12/2019 08:25   DG Chest Port 1 View  Result Date: 07/13/2019 CLINICAL DATA:  Empyema with chest tube present EXAM: PORTABLE CHEST 1 VIEW COMPARISON:  Jul 12, 2019 FINDINGS: Stable chest tube positioning on the right. No pneumothorax. Small right pleural effusion with right base atelectasis present. Lungs elsewhere clear. Heart size and pulmonary vascularity are normal. No adenopathy. There is degenerative change in the thoracic spine. IMPRESSION: Stable chest tube positioning without pneumothorax. Small right pleural effusion with right base atelectasis present. Lungs elsewhere clear. Stable cardiac silhouette. Electronically Signed   By: Lowella Grip III M.D.   On: 07/13/2019 09:31        Scheduled Meds: . alteplase (tPA) 10mg  in NS 61mL for Dr.Oaks (intrapleural administration/[facility])   Intrapleural Once  . amLODipine  5 mg Oral Daily  . aspirin EC  162 mg Oral Daily  . atorvastatin  20 mg Oral Daily  . hydrochlorothiazide  25 mg Oral Daily  . irbesartan  37.5 mg Oral Daily  . loratadine  10 mg Oral Daily  . nebivolol  5 mg Oral Daily  . tamsulosin  0.8 mg Oral QPC supper   Continuous  Infusions: . ampicillin-sulbactam (UNASYN) IV 3 g (07/13/19 1207)     LOS: 8 days    Time spent: 35 minutes    Sidney Ace, MD [facility] Pager [phone]  If 7PM-7AM, please contact night-coverage 07/13/2019, 12:35 PM

## 2019-07-14 LAB — BASIC METABOLIC PANEL
Anion gap: 10 (ref 5–15)
BUN: 10 mg/dL (ref 8–23)
CO2: 25 mmol/L (ref 22–32)
Calcium: 8.5 mg/dL — ABNORMAL LOW (ref 8.9–10.3)
Chloride: 101 mmol/L (ref 98–111)
Creatinine, Ser: 0.41 mg/dL — ABNORMAL LOW (ref 0.61–1.24)
GFR calc Af Amer: >60 mL/min (ref >60)
GFR calc non Af Amer: >60 mL/min (ref >60)
Glucose, Bld: 137 mg/dL — ABNORMAL HIGH (ref 70–99)
Potassium: 3.2 mmol/L — ABNORMAL LOW (ref 3.5–5.1)
Sodium: 136 mmol/L (ref 135–145)

## 2019-07-14 LAB — CBC WITH DIFFERENTIAL/PLATELET
Abs Immature Granulocytes: 0.04 10*3/uL (ref 0.00–0.07)
Basophils Absolute: 0.1 10*3/uL (ref 0.0–0.1)
Basophils Relative: 1 %
Eosinophils Absolute: 0.3 10*3/uL (ref 0.0–0.5)
Eosinophils Relative: 3 %
HCT: 34.8 % — ABNORMAL LOW (ref 39.0–52.0)
Hemoglobin: 11.8 g/dL — ABNORMAL LOW (ref 13.0–17.0)
Immature Granulocytes: 0 %
Lymphocytes Relative: 14 %
Lymphs Abs: 1.4 10*3/uL (ref 0.7–4.0)
MCH: 28 pg (ref 26.0–34.0)
MCHC: 33.9 g/dL (ref 30.0–36.0)
MCV: 82.7 fL (ref 80.0–100.0)
Monocytes Absolute: 0.8 10*3/uL (ref 0.1–1.0)
Monocytes Relative: 8 %
Neutro Abs: 7.2 10*3/uL (ref 1.7–7.7)
Neutrophils Relative %: 74 %
Platelets: 224 10*3/uL (ref 150–400)
RBC: 4.21 MIL/uL — ABNORMAL LOW (ref 4.22–5.81)
RDW: 13.5 % (ref 11.5–15.5)
WBC: 9.9 10*3/uL (ref 4.0–10.5)
nRBC: 0 % (ref 0.0–0.2)

## 2019-07-14 LAB — CHOLESTEROL, BODY FLUID: Cholesterol, Fluid: 82 mg/dL

## 2019-07-14 MED ORDER — POTASSIUM CHLORIDE CRYS ER 20 MEQ PO TBCR: 40.0000 meq | EXTENDED_RELEASE_TABLET | Freq: Once | ORAL | Status: DC

## 2019-07-14 MED ORDER — AMOXICILLIN-POT CLAVULANATE 875-125 MG PO TABS
1.0000 | ORAL_TABLET | Freq: Two times a day (BID) | ORAL | 0 refills | Status: DC
Start: 2019-07-14 — End: 2019-07-24

## 2019-07-14 NOTE — Discharge Summary (Signed)
Physician Discharge Summary  [name] [name] DOB: 1945-10-21 DOA: 07/05/2019  PCP: Derinda Late, MD  Admit date: 07/05/2019 Discharge date: 07/14/2019  Admitted From: Home Disposition: Home  Recommendations for Outpatient Follow-up:  1. Follow up with PCP in 1-2 weeks 2. Follow-up with pulmonary Dr. Lanney Gins next week  [place]: No Equipment/Devices: None Discharge Condition: Stable CODE STATUS: Full Diet recommendation: Heart Healthy  Brief/Interim Summary: HPI: Andrew Sparhawk Wilsonis a 74 y.o.malewith medical history significant ofrecent pna, htn, hyperglycemia, obesity, sleep apneasent to ED by pulmonology for treatment of empyema. Patient reported in March of this year he was treatedfor LLL infiltrate.He completed 3rounds antibiotics and steroids. By 4/29 he remained symptomatic. Associated symptoms include intermittent fevers with max temp 99.9, intermittent right chest stabbing pain worse with deep breath some mild shortness of breath and general malaise. He denies headache dizziness syncope or near syncope. He denies chest pain palpitations lower extremity edema. He denies abdominal pain nausea vomiting diarrhea constipation melena bright red blood per rectum. He denies any dysuria hematuria frequency or urgency. CT chest revealed pleural effusion. Underwent thoracentesis 07/04/19 yielding 14ml fluid. Fluid studies revealedgram +bacteria concerning for empyema. Patient was called at home and instructed to come to ED for admission.  5/21: Patient seen and examined on the day of discharge.  Chest tube was removed by cardiothoracic surgery on 07/13/2019.  Patient tolerated well.  No complications.  On room air.  Ambulating without difficulty.  Still a bit atelectatic on the right base.  Patient has been pulling good volumes on incentive spirometer and has been ambulating without difficulty around nursing unit.  Cleared for discharge at this time.   Discussed with pulmonary.  Patient will follow up next week in their office.  Per ID recommendations will recommend additional 2 weeks of Augmentin 875 mg twice daily.  Discharge Diagnoses:  Principal Problem:   Empyema ([place]) Active Problems:   Hypertension   Hyperlipidemia   BPH (benign prostatic hyperplasia)   Hypokalemia   Obesity   Sleep apnea   Hyperglycemia   Empyema of right pleural space (HCC)  Rt Empyema  Patient underwent a thoracentesis May 11 and fluid studies positive for gram-positive bacteria. He was instructed to come to the emergency department for admission/treatment. Patient now status post removal of 1 chest tube on 07/11/2019 Remaining chest tube CTS was unable to instill TPA Cardiothoracic surgery following for chest tube management  Second chest tube removed 07/13/2019 Patient remained on room air Stable for discharge on 07/14/2019 Discontinue Unasyn.  Start Augmentin.  Plan for 2-week additional course. Follow-up in pulmonary clinic next week  Hypokalemia Mild. Likely related to diuretic Replace as needed  Hypertension continueamlodipine, hydrochlorothiazide, Bystolic, valsartan.  Sleep apnea useshome BiPAP. -Respiratory consult fordevice  BPH  Stable at baseline. -Continue home meds  Obesity BMI 32. -nutritional consult  Hyperlipidemia Continue statins   Discharge Instructions  Discharge Instructions    Diet - low sodium heart healthy   Complete by: As directed    Increase activity slowly   Complete by: As directed      Allergies as of 07/14/2019      Reactions   Codeine Rash      Medication List    TAKE these medications   amLODipine 5 MG tablet Commonly known as: NORVASC Take 5 mg by mouth daily.   amoxicillin-clavulanate 875-125 MG tablet Commonly known as: Augmentin Take 1 tablet by mouth 2 (two) times daily for 14 days.   aspirin EC 81 MG  tablet Take 81 mg by mouth 2 (two) times daily.    atorvastatin 20 MG tablet Commonly known as: LIPITOR Take 20 mg by mouth at bedtime.   cetirizine 10 MG tablet Commonly known as: ZYRTEC Take 10 mg by mouth daily.   hydrochlorothiazide 25 MG tablet Commonly known as: HYDRODIURIL Take 25 mg by mouth daily.   Multi-Vitamin tablet Take 1 tablet by mouth daily.   nebivolol 5 MG tablet Commonly known as: BYSTOLIC Take 5 mg by mouth daily.   sildenafil 20 MG tablet Commonly known as: REVATIO Take 20-100 mg by mouth daily as needed (ED).   tamsulosin 0.4 MG Caps capsule Commonly known as: FLOMAX Take 0.8 mg by mouth daily after supper.   valsartan 160 MG tablet Commonly known as: DIOVAN Take 160 mg by mouth daily.      Follow-up Information    Ottie Glazier, MD. Go on 07/20/2019.   Specialty: Pulmonary Disease Why: 9:45am Contact information: [place] [address] [phone]        Derinda Late, MD. Go on 07/17/2019.   Specialty: Family Medicine Why: 2:00pm Contact information: [id number] [facility] [place] [address] [phone]          Allergies  Allergen Reactions  . Codeine Rash    Consultations:  ID, cardiothoracic surgery, pulmonary   Procedures/Studies: DG Chest 1 View  Result Date: 07/04/2019 CLINICAL DATA:  Status post right thoracentesis. EXAM: CHEST  1 VIEW COMPARISON:  CT of the chest on 06/29/2019 FINDINGS: The heart size and mediastinal contours are within normal limits. Residual lateral/posterolateral component of loculated pleural fluid remains on the right. No pneumothorax after right thoracentesis. No pulmonary edema. The visualized skeletal structures are unremarkable. IMPRESSION: No pneumothorax after right sided thoracentesis. Residual lateral/posterolateral component of loculated right pleural fluid remains. Electronically Signed   By: Aletta Edouard M.D.   On: 07/04/2019 14:26   DG Chest 2  View  Result Date: 07/12/2019 CLINICAL DATA:  Empyema.  Chest tube present. EXAM: CHEST - 2 VIEW COMPARISON:  07/11/2019 FINDINGS: Right basilar chest tube unchanged. Lungs are somewhat hypoinflated with evidence of a small amount right pleural fluid without significant change. No pneumothorax. Remainder of the lungs are clear. Cardiomediastinal silhouette and remainder of the exam is unchanged. IMPRESSION: Small amount right pleural fluid with right basilar chest tube unchanged. Electronically Signed   By: Marin Olp M.D.   On: 07/12/2019 08:25   CT CHEST WO CONTRAST  Result Date: 07/07/2019 CLINICAL DATA:  Follow-up scan for empyema of the RIGHT lung, chest tube placed 2 days ago EXAM: CT CHEST WITHOUT CONTRAST TECHNIQUE: Multidetector CT imaging of the chest was performed following the standard protocol without IV contrast. COMPARISON:  06/28/2009 and intervening interventional and ultrasound evaluations. FINDINGS: Cardiovascular: Calcified atheromatous plaque in the thoracic aorta similar to prior study. No aneurysmal dilation. Heart size is stable without pericardial effusion. Calcified coronary artery disease as before. Vessels not well assessed given lack of intravenous contrast. Mediastinum/Nodes: No thoracic inlet adenopathy. No axillary lymphadenopathy. No mediastinal adenopathy. Esophagus grossly normal. Lungs/Pleura: Since the previous examination there is been interval placement of a RIGHT-sided chest tube. A small amount of subcutaneous emphysema is noted. The marker for chest tube sideholes is at least 1.5 cm within the RIGHT chest cavity within the pleural space. Pleural fluid at the RIGHT lung base is perhaps slightly diminished but there is no change in the loculated fluid along the  fissure in the RIGHT chest when compared to the prior exam RIGHT basilar consolidative changes and volume loss are similar to the prior study. LEFT chest is clear. Airways are patent. Upper Abdomen: Upper  abdominal contents are unremarkable. No acute process in the upper abdomen. Musculoskeletal: Small amount of subcutaneous emphysema along the RIGHT chest wall as discussed. No chest wall mass. No acute bone process. Spinal degenerative change. IMPRESSION: 1. Slightly diminished RIGHT pleural fluid, dominant loculated area along the major fissure in the RIGHT chest is unchanged. 2. Persistent basilar consolidation/volume loss without change. This may represent rounded atelectasis or pneumonia. Follow-up is suggested to ensure there is no underlying mass. 3. Small amount of subcutaneous emphysema along the chest wall. Marker for sideholes is within the pleural space as discussed above. 4. Atherosclerotic changes in the thoracic aorta. Calcified coronary artery disease. 5. Aortic atherosclerosis. Aortic Atherosclerosis (ICD10-I70.0). Electronically Signed   By: Zetta Bills M.D.   On: 07/07/2019 16:33   CT CHEST WO CONTRAST  Result Date: 06/29/2019 CLINICAL DATA:  Pleural effusion, shortness of breath. Follow-up chest x-ray EXAM: CT CHEST WITHOUT CONTRAST TECHNIQUE: Multidetector CT imaging of the chest was performed following the standard protocol without IV contrast. COMPARISON:  Chest x-ray 05/11/2019 FINDINGS: Cardiovascular: Heart is normal size. Aorta normal caliber. Scattered coronary artery and aortic calcifications. Mediastinum/Nodes: No mediastinal, hilar, or axillary adenopathy. Trachea and esophagus are unremarkable. Thyroid unremarkable. Lungs/Pleura: Partially loculated moderate-sized right pleural effusion noted. Compressive atelectasis or pneumonia in the right lower lobe. Scattered granulomas in the right lung. Left lung clear. Upper Abdomen: Imaging into the upper abdomen shows no acute findings. Musculoskeletal: Chest wall soft tissues are unremarkable. No acute bony abnormality. IMPRESSION: Moderate partially loculated right pleural effusion. Right lower lobe compressive atelectasis versus  pneumonia. Electronically Signed   By: Rolm Baptise M.D.   On: 06/29/2019 14:56   DG Chest Port 1 View  Result Date: 07/13/2019 CLINICAL DATA:  Empyema with chest tube present EXAM: PORTABLE CHEST 1 VIEW COMPARISON:  Jul 12, 2019 FINDINGS: Stable chest tube positioning on the right. No pneumothorax. Small right pleural effusion with right base atelectasis present. Lungs elsewhere clear. Heart size and pulmonary vascularity are normal. No adenopathy. There is degenerative change in the thoracic spine. IMPRESSION: Stable chest tube positioning without pneumothorax. Small right pleural effusion with right base atelectasis present. Lungs elsewhere clear. Stable cardiac silhouette. Electronically Signed   By: Lowella Grip III M.D.   On: 07/13/2019 09:31   DG Chest Port 1 View  Result Date: 07/11/2019 CLINICAL DATA:  Empyema. EXAM: PORTABLE CHEST 1 VIEW COMPARISON:  Yesterday FINDINGS: Additional right-sided pleural catheter placed in the interim. There is improved aeration at the right base with decreased pleural fluid. Normal heart size. No visible pneumothorax. IMPRESSION: Improved right base aeration after additional catheter placement. Electronically Signed   By: Monte Fantasia M.D.   On: 07/11/2019 08:56   DG Chest Port 1 View  Result Date: 07/10/2019 CLINICAL DATA:  Atelectasis EXAM: PORTABLE CHEST 1 VIEW COMPARISON:  Portable exam at 0743 hrs compared to 07/09/2019 FINDINGS: Upper-normal size of cardiac silhouette. Mediastinal contours and pulmonary vascularity normal. Persistent RIGHT pleural effusion and basilar atelectasis. LEFT lung clear. No acute infiltrate or pneumothorax. Endplate spur formation thoracic spine, mild. IMPRESSION: Persistent RIGHT pleural effusion and basilar atelectasis. Electronically Signed   By: Lavonia Dana M.D.   On: 07/10/2019 08:20   DG Chest Port 1 View  Result Date: 07/09/2019 CLINICAL DATA:  Empyema, RIGHT-sided chest tube  in place. EXAM: PORTABLE CHEST 1 VIEW  COMPARISON:  Chest x-ray dated 07/07/2019. FINDINGS: RIGHT basilar chest tube appears stable in position. The overlying RIGHT pleural fluid is stable in extent. LEFT lung remains clear. No pneumothorax is seen. Heart size and mediastinal contours are stable. IMPRESSION: Stable chest x-ray. RIGHT-sided chest tube is stable in position. The overlying RIGHT pleural fluid is stable in extent. Electronically Signed   By: Franki Cabot M.D.   On: 07/09/2019 10:10   DG Chest Port 1 View  Result Date: 07/07/2019 CLINICAL DATA:  Follow-up right chest tube. EXAM: PORTABLE CHEST 1 VIEW COMPARISON:  07/06/2018 FINDINGS: Chest tube remains in place at the right lung base. There is slightly less pleural fluid than was seen yesterday. No pleural air is visible. Some pleural fluid does persist, with right lower lung atelectasis. Left hemithorax appears clear. IMPRESSION: Persistent right chest tube. Slightly less right pleural fluid and less pleural air. Moderate amount of pleural fluid does persist with lower right lung atelectasis. Electronically Signed   By: Nelson Chimes M.D.   On: 07/07/2019 11:55   DG Chest Port 1 View  Result Date: 07/06/2019 CLINICAL DATA:  Right empyema. EXAM: PORTABLE CHEST 1 VIEW COMPARISON:  Chest x-ray dated Jul 04, 2019. FINDINGS: New right pigtail chest tube with unchanged small loculated pleural effusion. The left lung is clear. No pneumothorax. The heart size and mediastinal contours are within normal limits. Normal pulmonary vascularity. No acute osseous abnormality. IMPRESSION: 1. New right-sided chest tube with unchanged small loculated pleural effusion. No pneumothorax. Electronically Signed   By: Titus Dubin M.D.   On: 07/06/2019 09:17   CT IMAGE GUIDED DRAINAGE BY PERCUTANEOUS CATHETER  Result Date: 07/10/2019 INDICATION: 74 year old with a loculated right pleural effusion. Patient already has one right chest tube but there is residual right pleural fluid and request for a  second chest tube placement. EXAM: CT-GUIDED RIGHT CHEST TUBE PLACEMENT MEDICATIONS: Moderate sedation ANESTHESIA/SEDATION: 1.0 mg IV Versed 50 mcg IV Fentanyl Moderate Sedation Time:  18 minutes The patient was continuously monitored during the procedure by the interventional radiology nurse under my direct supervision. COMPLICATIONS: None immediate. TECHNIQUE: Informed written consent was obtained from the patient after a thorough discussion of the procedural risks, benefits and alternatives. All questions were addressed. A timeout was performed prior to the initiation of the procedure. PROCEDURE: Patient was placed prone on the CT scanner. Pocket of fluid along the posterior aspect of the right chest was identified and targeted. The right posterior chest was prepped with chlorhexidine and sterile field was created. Skin and soft tissues were anesthetized with 1% lidocaine. Using CT guidance, a Yueh catheter was directed into the pleural space and yellow fluid was aspirated. Superstiff Amplatz wire was advanced into the pleural space. The tract was dilated to accommodate a 14 [place] multipurpose drain. Small amount of additional pleural fluid was aspirated. Chest tube was attached to the drainage system. Catheter was sutured to skin and a dressing was placed. FINDINGS: Loculated right pleural fluid. Pocket along the right posterior aspect of the chest was identified and targeted for drain placement. 63 French drain was successfully placed in the right pleural space. Small amount of yellow fluid was removed. Pocket of pleural air noted following chest tube placement compatible with the loculated nature of the pleural effusion. IMPRESSION: CT-guided placement of a right chest tube. Right pleural fluid is loculated. Electronically Signed   By: Markus Daft M.D.   On: 07/10/2019 13:35   CT IMAGE  GUIDED DRAINAGE BY PERCUTANEOUS CATHETER  Result Date: 07/05/2019 INDICATION: Right empyema EXAM: CT-GUIDED PLACEMENT  RIGHT 10 FRENCH CHEST TUBE MEDICATIONS: The patient is currently admitted to the hospital and receiving intravenous antibiotics. The antibiotics were administered within an appropriate time frame prior to the initiation of the procedure. ANESTHESIA/SEDATION: Fentanyl 50 mcg IV; Versed 1.0 mg IV Moderate Sedation Time:  11 MINUTES The patient was continuously monitored during the procedure by the interventional radiology nurse under my direct supervision. COMPLICATIONS: None immediate. PROCEDURE: Informed written consent was obtained from the patient after a thorough discussion of the procedural risks, benefits and alternatives. All questions were addressed. Maximal Sterile Barrier Technique was utilized including caps, mask, sterile gowns, sterile gloves, sterile drape, hand hygiene and skin antiseptic. A timeout was performed prior to the initiation of the procedure. Previous imaging reviewed. Patient positioned right anterior oblique. Noncontrast localization CT performed. The loculated right effusion was localized and marked for a lateral posterior approach. Under sterile conditions and local anesthesia, 18 gauge introducer needle was advanced from a posterolateral lower intercostal approach into the pleural fluid. Needle position confirmed with CT. Guidewire inserted followed by tract dilatation insert a 10 French drain. Drain catheter position confirmed with CT. Syringe aspiration yielded 10 cc of thick mucinous exudative fluid. Sample sent for pan culture. Catheter secured with Prolene suture and connected to external pleura vac. Sterile dressing applied. No immediate complication. Patient tolerated the procedure well. IMPRESSION: Successful CT-guided 10 French right chest tube insertion. Electronically Signed   By: Jerilynn Mages.  Shick M.D.   On: 07/05/2019 15:44   US THORACENTESIS ASP PLEURAL SPACE W/IMG GUIDE  Result Date: 07/04/2019 CLINICAL DATA:  Loculated right pleural effusion. EXAM: ULTRASOUND GUIDED RIGHT  THORACENTESIS COMPARISON:  CT of the chest on 06/29/2019 PROCEDURE: An ultrasound guided thoracentesis was thoroughly discussed with the patient and questions answered. The benefits, risks, alternatives and complications were also discussed. The patient understands and wishes to proceed with the procedure. Written consent was obtained. Ultrasound was performed to localize and mark an adequate pocket of fluid in the right chest. The area was then prepped and draped in the normal sterile fashion. 1% Lidocaine was used for local anesthesia. Under ultrasound guidance a 6 French Safe-T-Centesis catheter was introduced. Fluid was removed from the catheter. The catheter was then removed. Under ultrasound guidance, a 5 [place] Yueh centesis catheter was then introduced in a slightly higher pocket of pleural fluid in the right posterior chest. Aspiration was performed through the centesis catheter. The catheter was removed and a dressing applied. FINDINGS: Ultrasound shows a very complex appearing and loculated right pleural effusion with relatively small components posteriorly. From the first pocket sampled in the right posterior pleural space towards the lung base, only 10 mL of clear appearing amber fluid was able to be aspirated. The sample was sent for various requested labs. Decision was made to sample a second slightly higher pocket yielding 5 mL of grossly purulent whitish green thick fluid. This sample was sent for culture analysis. IMPRESSION: Highly loculated and complex right pleural effusion with relatively small pockets posteriorly. The first pocket yielded 10 mL of clear appearing amber fluid. The second more superior pocket yielded 5 mL of grossly purulent fluid likely consistent with empyema. Fluid analysis will be performed on both samples. Electronically Signed   By: Aletta Edouard M.D.   On: 07/04/2019 14:24    (Echo, Carotid, EGD, Colonoscopy, ERCP)    Subjective: Seen and examined on the day of  discharge.  No complaints.  Feels well.  No pain.  Stable for discharge.  Discharge Exam: Vitals:   07/14/19 0402 07/14/19 0816  BP: (!) 145/77 137/72  Pulse: 93 79  Resp: 19 20  Temp: 100 F (37.8 C) 98.3 F (36.8 C)  SpO2: 95% 93%   Vitals:   07/13/19 1553 07/13/19 1921 07/14/19 0402 07/14/19 0816  BP: (!) 148/76 (!) 152/68 (!) 145/77 137/72  Pulse: 83 91 93 79  Resp: 18 19 19 20   Temp: 97.7 F (36.5 C) 98.9 F (37.2 C) 100 F (37.8 C) 98.3 F (36.8 C)  TempSrc: Oral Oral Oral Oral  SpO2: 97% 97% 95% 93%  Weight:   96.6 kg   Height:        General: Pt is alert, awake, not in acute distress Cardiovascular: RRR, S1/S2 +, no rubs, no gallops Respiratory: Decreased lung sounds at right base.  Normal work of breathing Abdominal: Soft, NT, ND, bowel sounds + Extremities: no edema, no cyanosis    The results of significant diagnostics from this hospitalization (including imaging, microbiology, ancillary and laboratory) are listed below for reference.     Microbiology: Recent Results (from the past 240 hour(s))  Blood culture (routine x 2)     Status: None   Collection Time: 07/05/19  1:14 PM   Specimen: BLOOD  Result Value Ref Range Status   Specimen Description BLOOD LEFT ANTECUBITAL  Final   Special Requests   Final    BOTTLES DRAWN AEROBIC AND ANAEROBIC Blood Culture results may not be optimal due to an excessive volume of blood received in culture bottles   Culture   Final    NO GROWTH 5 DAYS Performed at [facility], [address] [place], [place] [address]    Report Status 07/10/2019 FINAL  Final  Blood culture (routine x 2)     Status: None   Collection Time: 07/05/19  1:14 PM   Specimen: BLOOD  Result Value Ref Range Status   Specimen Description BLOOD RIGHT ANTECUBITAL  Final   Special Requests   Final    BOTTLES DRAWN AEROBIC AND ANAEROBIC Blood Culture results may not be optimal due to an inadequate volume of blood received in culture  bottles   Culture   Final    NO GROWTH 5 DAYS Performed at [facility], [address] [place], [place] [address]    Report Status 07/10/2019 FINAL  Final  Aerobic/Anaerobic Culture (surgical/deep wound)     Status: None   Collection Time: 07/05/19  3:45 PM   Specimen: Fluid; Abscess  Result Value Ref Range Status   Specimen Description FLUID RIGHT PLEURAL  Final   Special Requests NONE  Final   Gram Stain   Final    RARE WBC PRESENT,BOTH PMN AND MONONUCLEAR NO ORGANISMS SEEN    Culture   Final    No growth aerobically or anaerobically. Performed at [facility], [place] [address] [place], [place] [address]    Report Status 07/10/2019 FINAL  Final  Fungus Culture With Stain     Status: None (Preliminary result)   Collection Time: 07/05/19  3:45 PM   Specimen: Pleural, Right  Result Value Ref Range Status   Fungus Stain Final report  Final    Comment: (NOTE) Performed At: [facility] [place], [place] [id number] Rush Farmer MD [id number]    Fungus (Mycology) Culture PENDING  Incomplete   Fungal Source LUNG  Final    Comment: RIGHT  Performed at [facility], [place], [place] [address]   Acid Fast Smear (AFB)     Status: None   Collection Time: 07/05/19  3:45 PM   Specimen: Pleural, Right  Result Value Ref Range Status   AFB Specimen Processing Concentration  Final   Acid Fast Smear Negative  Final    Comment: (NOTE) Performed At: [facility] [address] [place], [place] [id number] Rush Farmer MD [id number]    Source (AFB) LUNG  Final    Comment: RIGHT Performed at [facility], [address] [place], [place] [address]   Fungus Culture Result     Status: None   Collection Time: 07/05/19  3:45 PM  Result Value Ref Range Status   Result 1 Comment  Final    Comment: (NOTE) KOH/Calcofluor preparation:  no fungus observed. Performed At: [facility] [place], [place] [id number] Rush Farmer MD [id number]   MRSA PCR Screening     Status: None   Collection Time: 07/05/19 11:05 PM  Result Value Ref Range Status   MRSA by PCR NEGATIVE NEGATIVE Final    Comment:        The GeneXpert MRSA Assay (FDA approved for NASAL specimens only), is one component of a comprehensive MRSA colonization surveillance program. It is not intended to diagnose MRSA infection nor to guide or monitor treatment for MRSA infections. Performed at [facility], [address] [place], [place] [address]      Labs: BNP (last 3 results) No results for input(s): BNP in the last 8760 hours. Basic Metabolic Panel: Recent Labs  Lab 07/10/19 0622 07/11/19 0533 07/12/19 0520 07/14/19 0727  NA 138  --  137 136  K 3.4* 3.6 3.5 3.2*  CL 105  --  104 101  CO2 25  --  23 25  GLUCOSE 127*  --  122* 137*  BUN 10  --  10 10  CREATININE 0.49*  --  0.60* 0.41*  CALCIUM 8.4*  --  8.6* 8.5*   Liver Function Tests: No results for input(s): AST, ALT, ALKPHOS, BILITOT, PROT, ALBUMIN in the last 168 hours. No results for input(s): LIPASE, AMYLASE in the last 168 hours. No results for input(s): AMMONIA in the last 168 hours. CBC: Recent Labs  Lab 07/09/19 0616 07/10/19 0622 07/12/19 0520 07/14/19 0727  WBC 10.6* 8.2 8.9 9.9  NEUTROABS 7.9*  --  6.4 7.2  HGB 11.5* 10.9* 11.8* 11.8*  HCT 35.4* 31.9* 34.4* 34.8*  MCV 86.1 83.3 83.3 82.7  PLT 221 203 228 224   Cardiac Enzymes: No results for input(s): CKTOTAL, CKMB, CKMBINDEX, TROPONINI in the last 168 hours. BNP: Invalid input(s): POCBNP CBG: No results for input(s): GLUCAP in the last 168 hours. D-Dimer No results for input(s): DDIMER in the last 72 hours. Hgb A1c No results for input(s): HGBA1C in the last 72 hours. Lipid Profile No results for input(s): CHOL, HDL, LDLCALC, TRIG, CHOLHDL, LDLDIRECT in the last 72 hours. Thyroid function studies No results for input(s): TSH,  T4TOTAL, T3FREE, THYROIDAB in the last 72 hours.  Invalid input(s): FREET3 Anemia work up No results for input(s): VITAMINB12, FOLATE, FERRITIN, TIBC, IRON, RETICCTPCT in the last 72 hours. Urinalysis    Component Value Date/Time   COLORURINE YELLOW (A) 07/05/2019 2020   APPEARANCEUR CLEAR (A) 07/05/2019 2020   LABSPEC 1.019 07/05/2019 2020   PHURINE 7.0 07/05/2019 2020   GLUCOSEU NEGATIVE 07/05/2019 2020   HGBUR NEGATIVE 07/05/2019 2020  [place] NEGATIVE 07/05/2019 2020   KETONESUR NEGATIVE 07/05/2019 2020   PROTEINUR NEGATIVE 07/05/2019 2020   NITRITE NEGATIVE 07/05/2019 2020   LEUKOCYTESUR NEGATIVE 07/05/2019 2020   Sepsis Labs Invalid input(s): PROCALCITONIN,  WBC,  LACTICIDVEN Microbiology Recent Results (from the past 240 hour(s))  Blood culture (routine x 2)     Status: None   Collection Time: 07/05/19  1:14 PM   Specimen: BLOOD  Result Value Ref Range Status   Specimen Description BLOOD LEFT ANTECUBITAL  Final   Special Requests   Final    BOTTLES DRAWN AEROBIC AND ANAEROBIC Blood Culture results may not be optimal due to an excessive volume of blood received in culture bottles   Culture   Final    NO GROWTH 5 DAYS Performed at [facility], [address] [place], [place] [address]    Report Status 07/10/2019 FINAL  Final  Blood culture (routine x 2)     Status: None   Collection Time: 07/05/19  1:14 PM   Specimen: BLOOD  Result Value Ref Range Status   Specimen Description BLOOD RIGHT ANTECUBITAL  Final   Special Requests   Final    BOTTLES DRAWN AEROBIC AND ANAEROBIC Blood Culture results may not be optimal due to an inadequate volume of blood received in culture bottles   Culture   Final    NO GROWTH 5 DAYS Performed at [facility], [address] [place], [place] [address]    Report Status 07/10/2019 FINAL  Final  Aerobic/Anaerobic Culture (surgical/deep wound)     Status: None   Collection Time: 07/05/19  3:45 PM   Specimen:  Fluid; Abscess  Result Value Ref Range Status   Specimen Description FLUID RIGHT PLEURAL  Final   Special Requests NONE  Final   Gram Stain   Final    RARE WBC PRESENT,BOTH PMN AND MONONUCLEAR NO ORGANISMS SEEN    Culture   Final    No growth aerobically or anaerobically. Performed at [facility], [address] [place], [place] [address]    Report Status 07/10/2019 FINAL  Final  Fungus Culture With Stain     Status: None (Preliminary result)   Collection Time: 07/05/19  3:45 PM   Specimen: Pleural, Right  Result Value Ref Range Status   Fungus Stain Final report  Final    Comment: (NOTE) Performed At: [facility] [place], [place] [id number] Rush Farmer MD [id number]    Fungus (Mycology) Culture PENDING  Incomplete   Fungal Source LUNG  Final    Comment: RIGHT Performed at [facility], [address] [place], [place] [address]   Acid Fast Smear (AFB)     Status: None   Collection Time: 07/05/19  3:45 PM   Specimen: Pleural, Right  Result Value Ref Range Status   AFB Specimen Processing Concentration  Final   Acid Fast Smear Negative  Final    Comment: (NOTE) Performed At: [facility] [address] [place], [place] [id number] Rush Farmer MD [id number]    Source (AFB) LUNG  Final    Comment: RIGHT Performed at [facility], [address] [place], [place] [address]   Fungus Culture Result     Status: None   Collection Time: 07/05/19  3:45 PM  Result Value Ref Range Status   Result 1 Comment  Final    Comment: (NOTE) KOH/Calcofluor preparation:  no fungus observed. Performed At: [facility] [place], [place]  [id number] Rush Farmer MD [id number]   MRSA PCR Screening     Status: None   Collection Time: 07/05/19 11:05 PM  Result Value Ref Range Status   MRSA by PCR NEGATIVE NEGATIVE Final    Comment:        The GeneXpert MRSA Assay (FDA approved for NASAL  specimens only), is one component of a comprehensive MRSA colonization surveillance program. It is not intended to diagnose MRSA infection nor to guide or monitor treatment for MRSA infections. Performed at [facility], [address], [place], [place] [address]      Time coordinating discharge: Over 30 minutes  SIGNED:   Sidney Ace, MD  [facility] 07/14/2019, 1:09 PM Pager   If 7PM-7AM, please contact night-coverage

## 2019-07-17 DIAGNOSIS — J90 Pleural effusion, not elsewhere classified: Secondary | ICD-10-CM | POA: Diagnosis not present

## 2019-07-17 DIAGNOSIS — J869 Pyothorax without fistula: Secondary | ICD-10-CM | POA: Diagnosis not present

## 2019-07-18 ENCOUNTER — Encounter: Payer: Self-pay | Admitting: Emergency Medicine

## 2019-07-18 ENCOUNTER — Inpatient Hospital Stay
Admission: EM | Admit: 2019-07-18 | Discharge: 2019-07-24 | DRG: 915 | Disposition: A | Discharge status: Home / Self Care | Payer: PPO | Attending: Internal Medicine | Admitting: Internal Medicine

## 2019-07-18 ENCOUNTER — Emergency Department: Payer: PPO

## 2019-07-18 ENCOUNTER — Other Ambulatory Visit: Payer: Self-pay

## 2019-07-18 ENCOUNTER — Inpatient Hospital Stay: Payer: PPO

## 2019-07-18 DIAGNOSIS — N400 Enlarged prostate without lower urinary tract symptoms: Secondary | ICD-10-CM | POA: Diagnosis not present

## 2019-07-18 DIAGNOSIS — T8069XA Other serum reaction due to other serum, initial encounter: Secondary | ICD-10-CM | POA: Diagnosis not present

## 2019-07-18 DIAGNOSIS — Z85828 Personal history of other malignant neoplasm of skin: Secondary | ICD-10-CM

## 2019-07-18 DIAGNOSIS — Z87891 Personal history of nicotine dependence: Secondary | ICD-10-CM | POA: Diagnosis not present

## 2019-07-18 DIAGNOSIS — Z803 Family history of malignant neoplasm of breast: Secondary | ICD-10-CM

## 2019-07-18 DIAGNOSIS — M1990 Unspecified osteoarthritis, unspecified site: Secondary | ICD-10-CM | POA: Diagnosis not present

## 2019-07-18 DIAGNOSIS — Z20822 Contact with and (suspected) exposure to covid-19: Secondary | ICD-10-CM | POA: Diagnosis present

## 2019-07-18 DIAGNOSIS — M064 Inflammatory polyarthropathy: Secondary | ICD-10-CM | POA: Diagnosis present

## 2019-07-18 DIAGNOSIS — M4802 Spinal stenosis, cervical region: Secondary | ICD-10-CM | POA: Diagnosis present

## 2019-07-18 DIAGNOSIS — Z79899 Other long term (current) drug therapy: Secondary | ICD-10-CM | POA: Diagnosis not present

## 2019-07-18 DIAGNOSIS — J90 Pleural effusion, not elsewhere classified: Secondary | ICD-10-CM | POA: Diagnosis not present

## 2019-07-18 DIAGNOSIS — M542 Cervicalgia: Secondary | ICD-10-CM | POA: Diagnosis present

## 2019-07-18 DIAGNOSIS — I10 Essential (primary) hypertension: Secondary | ICD-10-CM | POA: Diagnosis present

## 2019-07-18 DIAGNOSIS — Z885 Allergy status to narcotic agent status: Secondary | ICD-10-CM | POA: Diagnosis not present

## 2019-07-18 DIAGNOSIS — E785 Hyperlipidemia, unspecified: Secondary | ICD-10-CM | POA: Diagnosis not present

## 2019-07-18 DIAGNOSIS — M130 Polyarthritis, unspecified: Secondary | ICD-10-CM | POA: Diagnosis not present

## 2019-07-18 DIAGNOSIS — E876 Hypokalemia: Secondary | ICD-10-CM | POA: Diagnosis present

## 2019-07-18 DIAGNOSIS — R21 Rash and other nonspecific skin eruption: Secondary | ICD-10-CM

## 2019-07-18 DIAGNOSIS — Z8249 Family history of ischemic heart disease and other diseases of the circulatory system: Secondary | ICD-10-CM

## 2019-07-18 DIAGNOSIS — Z7982 Long term (current) use of aspirin: Secondary | ICD-10-CM | POA: Diagnosis not present

## 2019-07-18 DIAGNOSIS — R700 Elevated erythrocyte sedimentation rate: Secondary | ICD-10-CM | POA: Diagnosis not present

## 2019-07-18 DIAGNOSIS — J189 Pneumonia, unspecified organism: Secondary | ICD-10-CM | POA: Diagnosis not present

## 2019-07-18 DIAGNOSIS — J869 Pyothorax without fistula: Secondary | ICD-10-CM | POA: Diagnosis not present

## 2019-07-18 DIAGNOSIS — I700 Atherosclerosis of aorta: Secondary | ICD-10-CM | POA: Diagnosis not present

## 2019-07-18 DIAGNOSIS — M7989 Other specified soft tissue disorders: Secondary | ICD-10-CM | POA: Diagnosis not present

## 2019-07-18 DIAGNOSIS — G4733 Obstructive sleep apnea (adult) (pediatric): Secondary | ICD-10-CM | POA: Diagnosis not present

## 2019-07-18 DIAGNOSIS — M2550 Pain in unspecified joint: Secondary | ICD-10-CM | POA: Diagnosis present

## 2019-07-18 DIAGNOSIS — Z808 Family history of malignant neoplasm of other organs or systems: Secondary | ICD-10-CM

## 2019-07-18 DIAGNOSIS — Z83438 Family history of other disorder of lipoprotein metabolism and other lipidemia: Secondary | ICD-10-CM | POA: Diagnosis not present

## 2019-07-18 DIAGNOSIS — R509 Fever, unspecified: Secondary | ICD-10-CM | POA: Diagnosis not present

## 2019-07-18 DIAGNOSIS — M79642 Pain in left hand: Secondary | ICD-10-CM | POA: Diagnosis not present

## 2019-07-18 LAB — CBC WITH DIFFERENTIAL/PLATELET
Abs Immature Granulocytes: 0.04 10*3/uL (ref 0.00–0.07)
Basophils Absolute: 0 10*3/uL (ref 0.0–0.1)
Basophils Relative: 0 %
Eosinophils Absolute: 0.2 10*3/uL (ref 0.0–0.5)
Eosinophils Relative: 2 %
HCT: 37.1 % — ABNORMAL LOW (ref 39.0–52.0)
Hemoglobin: 12.4 g/dL — ABNORMAL LOW (ref 13.0–17.0)
Immature Granulocytes: 0 %
Lymphocytes Relative: 11 %
Lymphs Abs: 1 10*3/uL (ref 0.7–4.0)
MCH: 27.8 pg (ref 26.0–34.0)
MCHC: 33.4 g/dL (ref 30.0–36.0)
MCV: 83.2 fL (ref 80.0–100.0)
Monocytes Absolute: 0.7 10*3/uL (ref 0.1–1.0)
Monocytes Relative: 8 %
Neutro Abs: 7.5 10*3/uL (ref 1.7–7.7)
Neutrophils Relative %: 79 %
Platelets: 279 10*3/uL (ref 150–400)
RBC: 4.46 MIL/uL (ref 4.22–5.81)
RDW: 13.4 % (ref 11.5–15.5)
WBC: 9.4 10*3/uL (ref 4.0–10.5)
nRBC: 0 % (ref 0.0–0.2)

## 2019-07-18 LAB — COMPREHENSIVE METABOLIC PANEL
ALT: 33 U/L (ref 0–44)
AST: 28 U/L (ref 15–41)
Albumin: 3.3 g/dL — ABNORMAL LOW (ref 3.5–5.0)
Alkaline Phosphatase: 51 U/L (ref 38–126)
Anion gap: 12 (ref 5–15)
BUN: 13 mg/dL (ref 8–23)
CO2: 24 mmol/L (ref 22–32)
Calcium: 9.2 mg/dL (ref 8.9–10.3)
Chloride: 98 mmol/L (ref 98–111)
Creatinine, Ser: 0.62 mg/dL (ref 0.61–1.24)
GFR calc Af Amer: >60 mL/min (ref >60)
GFR calc non Af Amer: >60 mL/min (ref >60)
Glucose, Bld: 152 mg/dL — ABNORMAL HIGH (ref 70–99)
Potassium: 3.3 mmol/L — ABNORMAL LOW (ref 3.5–5.1)
Sodium: 134 mmol/L — ABNORMAL LOW (ref 135–145)
Total Bilirubin: 1 mg/dL (ref 0.3–1.2)
Total Protein: 8.1 g/dL (ref 6.5–8.1)

## 2019-07-18 LAB — SEDIMENTATION RATE: Sed Rate: 117 mm/hr — ABNORMAL HIGH (ref 0–20)

## 2019-07-18 LAB — PROCALCITONIN: Procalcitonin: <0.1 ng/mL

## 2019-07-18 LAB — C-REACTIVE PROTEIN: CRP: 11.1 mg/dL — ABNORMAL HIGH (ref <1.0)

## 2019-07-18 LAB — ABO/RH: ABO/RH(D): A POS

## 2019-07-18 LAB — SARS CORONAVIRUS 2 BY RT PCR (HOSPITAL ORDER, PERFORMED IN ~~LOC~~ HOSPITAL LAB): SARS Coronavirus 2: NEGATIVE

## 2019-07-18 LAB — URIC ACID: Uric Acid, Serum: 5.2 mg/dL (ref 3.7–8.6)

## 2019-07-18 MED ORDER — NEBIVOLOL HCL 5 MG PO TABS
5.0000 mg | ORAL_TABLET | Freq: Every day | ORAL | Status: DC
  Administered 2019-07-20 – 2019-07-24 (×5): 5 mg via ORAL
  Filled 2019-07-18 (×6): qty 1

## 2019-07-18 MED ORDER — PIPERACILLIN-TAZOBACTAM 3.375 G IVPB 30 MIN
3.3750 g | Freq: Once | INTRAVENOUS | Status: AC
  Administered 2019-07-18: 3.375 g via INTRAVENOUS
  Filled 2019-07-18: qty 50

## 2019-07-18 MED ORDER — GADOBUTROL 1 MMOL/ML IV SOLN
9.0000 mL | Freq: Once | INTRAVENOUS | Status: AC | PRN
  Administered 2019-07-19: 9 mL via INTRAVENOUS
  Filled 2019-07-18: qty 10

## 2019-07-18 MED ORDER — OXYCODONE-ACETAMINOPHEN 5-325 MG PO TABS
1.0000 | ORAL_TABLET | ORAL | Status: DC | PRN
  Administered 2019-07-18 – 2019-07-22 (×6): 1 via ORAL
  Filled 2019-07-18 (×6): qty 1

## 2019-07-18 MED ORDER — VANCOMYCIN HCL 1250 MG/250ML IV SOLN
1250.0000 mg | Freq: Two times a day (BID) | INTRAVENOUS | Status: DC
  Filled 2019-07-18 (×2): qty 250

## 2019-07-18 MED ORDER — POTASSIUM CHLORIDE CRYS ER 20 MEQ PO TBCR
30.0000 meq | EXTENDED_RELEASE_TABLET | Freq: Once | ORAL | Status: AC
  Administered 2019-07-18: 30 meq via ORAL
  Filled 2019-07-18: qty 2

## 2019-07-18 MED ORDER — ADULT MULTIVITAMIN W/MINERALS CH
1.0000 | ORAL_TABLET | Freq: Every day | ORAL | Status: DC
  Administered 2019-07-20 – 2019-07-24 (×5): 1 via ORAL
  Filled 2019-07-18 (×6): qty 1

## 2019-07-18 MED ORDER — VANCOMYCIN HCL 2000 MG/400ML IV SOLN
2000.0000 mg | Freq: Once | INTRAVENOUS | Status: AC
  Administered 2019-07-18: 2000 mg via INTRAVENOUS
  Filled 2019-07-18: qty 400

## 2019-07-18 MED ORDER — SODIUM CHLORIDE 0.9 % IV SOLN
3.0000 g | Freq: Once | INTRAVENOUS | Status: DC
  Filled 2019-07-18: qty 8

## 2019-07-18 MED ORDER — ACETAMINOPHEN 325 MG PO TABS
650.0000 mg | ORAL_TABLET | Freq: Four times a day (QID) | ORAL | Status: DC | PRN
  Administered 2019-07-18 – 2019-07-22 (×2): 650 mg via ORAL
  Filled 2019-07-18 (×2): qty 2

## 2019-07-18 MED ORDER — HYDRALAZINE HCL 20 MG/ML IJ SOLN: 5.0000 mg | INTRAMUSCULAR | Status: DC | PRN

## 2019-07-18 MED ORDER — TAMSULOSIN HCL 0.4 MG PO CAPS
0.8000 mg | ORAL_CAPSULE | Freq: Every day | ORAL | Status: DC
  Administered 2019-07-19 – 2019-07-23 (×5): 0.8 mg via ORAL
  Filled 2019-07-18 (×5): qty 2

## 2019-07-18 MED ORDER — IOHEXOL 300 MG/ML  SOLN
75.0000 mL | Freq: Once | INTRAMUSCULAR | Status: AC | PRN
  Administered 2019-07-18: 75 mL via INTRAVENOUS

## 2019-07-18 MED ORDER — HYDROXYZINE HCL 50 MG/ML IM SOLN
25.0000 mg | Freq: Four times a day (QID) | INTRAMUSCULAR | Status: DC | PRN
  Filled 2019-07-18: qty 0.5

## 2019-07-18 MED ORDER — LORATADINE 10 MG PO TABS
10.0000 mg | ORAL_TABLET | Freq: Every day | ORAL | Status: DC
  Administered 2019-07-18 – 2019-07-24 (×6): 10 mg via ORAL
  Filled 2019-07-18 (×7): qty 1

## 2019-07-18 MED ORDER — ASPIRIN EC 81 MG PO TBEC
81.0000 mg | DELAYED_RELEASE_TABLET | Freq: Two times a day (BID) | ORAL | Status: DC
  Administered 2019-07-18 – 2019-07-24 (×11): 81 mg via ORAL
  Filled 2019-07-18 (×12): qty 1

## 2019-07-18 MED ORDER — IRBESARTAN 150 MG PO TABS
150.0000 mg | ORAL_TABLET | Freq: Every day | ORAL | Status: DC
  Administered 2019-07-20 – 2019-07-24 (×5): 150 mg via ORAL
  Filled 2019-07-18 (×6): qty 1

## 2019-07-18 MED ORDER — AMLODIPINE BESYLATE 5 MG PO TABS
5.0000 mg | ORAL_TABLET | Freq: Every day | ORAL | Status: DC
  Administered 2019-07-20 – 2019-07-24 (×5): 5 mg via ORAL
  Filled 2019-07-18 (×6): qty 1

## 2019-07-18 MED ORDER — PIPERACILLIN-TAZOBACTAM 3.375 G IVPB
3.3750 g | Freq: Three times a day (TID) | INTRAVENOUS | Status: DC
  Administered 2019-07-18 – 2019-07-19 (×2): 3.375 g via INTRAVENOUS
  Filled 2019-07-18 (×2): qty 50

## 2019-07-18 MED ORDER — ATORVASTATIN CALCIUM 20 MG PO TABS
20.0000 mg | ORAL_TABLET | Freq: Every day | ORAL | Status: DC
  Administered 2019-07-18 – 2019-07-23 (×6): 20 mg via ORAL
  Filled 2019-07-18 (×6): qty 1

## 2019-07-18 NOTE — Consult Note (Signed)
Patient ID: Andrew Myers, male   DOB: 09-04-1945, 74 y.o.   MRN: [id number]  Chief Complaint  Patient presents with  . Allergic Reaction  . Fever  . Joint Swelling    Referred By Dr. Lindell Noe Reason for Referral Fever and abnormal CT of chest  HPI Location, Quality, Duration, Severity, Timing, Context, Modifying Factors, Associated Signs and Symptoms.  Andrew Myers is a 74 y.o. male.  Patient well known to me.  Discharged less than one week ago after a one week hospitalization with 2 percutaneous pigtail catheters inserted for right empyema.  Had a thoracentesis in the past with Gram positive cocci.  Went home on oral antibiotics and fever returned as high as 102.  Last night he developed swelling of the MCP joint left index finger and pain with neck movement from side to side.  No cough or shortness of breath.  No chills.  Today CT shows improved right pleural space overall.  No other obvious source of fever on the CT scan,.   Past Medical History:  Diagnosis Date  . BPH (benign prostatic hyperplasia)   . CAP (community acquired pneumonia) 04/2019  . Empyema lung ([place]) 06/2019  . Hyperlipidemia   . Hypertension   . Obesity   . Pleural effusion on right 05/2019  . Skin cancer    BCC, SCC  . Sleep apnea   . Sleep apnea     Past Surgical History:  Procedure Laterality Date  . APPENDECTOMY    . CATARACT EXTRACTION    . FRACTURE SURGERY      Family History  Problem Relation Age of Onset  . Breast cancer Mother   . Hypertension Mother   . Hyperlipidemia Mother   . Heart failure Mother   . Heart attack Father   . Thyroid cancer Sister     Social History Social History   Tobacco Use  . Smoking status: Former Smoker    Quit date: 05/07/1967    Years since quitting: 52.2  . Smokeless tobacco: Never Used  Substance Use Topics  . Alcohol use: Yes    Comment: rarely  . Drug use: No    Allergies  Allergen Reactions  . Codeine Rash    Current  Facility-Administered Medications  Medication Dose Route Frequency Provider Last Rate Last Admin  . piperacillin-tazobactam (ZOSYN) IVPB 3.375 g  3.375 g Intravenous Once Rocky Morel, RPH      . vancomycin (VANCOREADY) IVPB 2000 mg/400 mL  2,000 mg Intravenous Once Rocky Morel, [facility]       Current Outpatient Medications  Medication Sig Dispense Refill  . amLODipine (NORVASC) 5 MG tablet Take 5 mg by mouth daily.    Marland Kitchen amoxicillin-clavulanate (AUGMENTIN) 875-125 MG tablet Take 1 tablet by mouth 2 (two) times daily for 14 days. 28 tablet 0  . aspirin EC 81 MG tablet Take 81 mg by mouth 2 (two) times daily.     Marland Kitchen atorvastatin (LIPITOR) 20 MG tablet Take 20 mg by mouth at bedtime.     . cetirizine (ZYRTEC) 10 MG tablet Take 10 mg by mouth daily.    . hydrochlorothiazide (HYDRODIURIL) 25 MG tablet Take 25 mg by mouth daily.    . Multiple Vitamin (MULTI-VITAMIN) tablet Take 1 tablet by mouth daily.    . nebivolol (BYSTOLIC) 5 MG tablet Take 5 mg by mouth daily.    . tamsulosin (FLOMAX) 0.4 MG CAPS capsule Take 0.8 mg by mouth daily after supper.     Marland Kitchen  valsartan (DIOVAN) 160 MG tablet Take 160 mg by mouth daily.     . sildenafil (REVATIO) 20 MG tablet Take 20-100 mg by mouth daily as needed (ED).         Review of Systems A complete review of systems was asked and was negative except for the following positive findings swelling left hand and neck pain  Blood pressure (!) 147/93, pulse (!) 44, temperature 98.3 F (36.8 C), temperature source Oral, resp. rate 20, height 5\' 11"  (1.803 m), weight 96.2 kg, SpO2 97 %.  Physical Exam CONSTITUTIONAL:  Pleasant, well-developed, well-nourished, and in no acute distress. EYES: Pupils equal and reactive to light, Sclera non-icteric EARS, NOSE, MOUTH AND THROAT:  The oropharynx was clear.  Dentition is good repair.  Oral mucosa pink and moist. LYMPH NODES:  Lymph nodes in the neck and axillae were normal RESPIRATORY:  Lungs were clear.  Normal  respiratory effort without pathologic use of accessory muscles of respiration CARDIOVASCULAR: Heart was regular without murmurs.  There were no carotid bruits. GI: The abdomen was soft, nontender, and nondistended. There were no palpable masses. There was no hepatosplenomegaly. There were normal bowel sounds in all quadrants. GU:  Rectal deferred.   MUSCULOSKELETAL:  Normal muscle strength and tone.  No clubbing or cyanosis.   SKIN:  There were no pathologic skin lesions.  There were no nodules on palpation.  There is some mild erythema of the MCP joint left index finger. NEUROLOGIC:  Sensation is normal.  Cranial nerves are grossly intact. PSYCH:  Oriented to person, place and time.  Mood and affect are normal.  Data Reviewed Labs and CT scan  I have personally reviewed the patient's imaging, laboratory findings and medical records.    Assessment    The CT from today actually looks better than 2 weeks ago but sill it is not normal.  He is desirous of "getting to the bottom" of his fever.  I see no other obvious source but there certainly may be    Plan    I explained in great detail the indications and risks of right thoracotomy and decortication of lung.  Risks of bleeding, infection, air leak, recurrence and death were reviewed.  Risks discussed as well as postoperative management.  If the Pulmonologists and Infectious Disease consultants agree, we can do this tomorrow afternoon.    Nestor Lewandowsky, MD 07/18/2019, 2:02 PM

## 2019-07-18 NOTE — Progress Notes (Signed)
Pharmacy Antibiotic Note  Andrew Myers is a 75 y.o. male admitted on 07/18/2019 with empyema and joint pain.  Pharmacy has been consulted for Zosyn and vancomycin dosing.  Plan: Zosyn 3.375g IV q8h (4 hour infusion).   Vancomycin 2g IV x1 given in the ED Will continue dosing with vancomycin 1250 mg IV q12h (PK dosing with SCr ~1) Goal trough 15-20 mcg/ml  Will need to closely monitoring renal function with Zosyn and Vancomycin    Height: 5\' 11"  (180.3 cm) Weight: 96.2 kg (212 lb) IBW/kg (Calculated) : 75.3  Temp (24hrs), Avg:98.3 F (36.8 C), Min:98.3 F (36.8 C), Max:98.3 F (36.8 C)  Recent Labs  Lab 07/12/19 0520 07/14/19 0727 07/18/19 1018  WBC 8.9 9.9 9.4  CREATININE 0.60* 0.41* 0.62    Estimated Creatinine Clearance: 98.8 mL/min (by C-G formula based on SCr of 0.62 mg/dL).    Allergies  Allergen Reactions  . Codeine Rash    Antimicrobials this admission: Vancomycin/Zosyn 5/25 >>  Dose adjustments this admission:   Microbiology results: 5/25 BCx: collected    Thank you for allowing pharmacy to be a part of this patient's care.  Rocky Morel 07/18/2019 6:57 PM

## 2019-07-18 NOTE — ED Provider Notes (Signed)
[facility] Emergency Department Provider Note  ____________________________________________   First MD Initiated Contact with Patient 07/18/19 [phone]     (approximate)  I have reviewed the triage vital signs and the nursing notes.   HISTORY  Chief Complaint Allergic Reaction, Fever, and Joint Swelling    HPI Andrew Myers is a 74 y.o. male with complicated past medical history including recent empyema of the right pleural space on Augmentin here with body aches, fever, neck pain, and left index finger pain.  The patient was just discharged from the hospital following chest tube x2 and IV antibiotics.  He states he was discharged on Augmentin.  Over the last 3 to 4 days, he has noticed increasing fevers at home, chills, as well as transient right knee pain, left first MTP pain, and now neck pain with neck stiffness.  He has had fevers that have been increasing in height up to 102 Fahrenheit at home.  He has been taking his medication as prescribed.  He denies any increased shortness of breath or cough.  He has had some mild itching at the sites of his prior chest tubes but denies any increased drainage from this area.  No other complaints.  No abdominal pain, nausea, or vomiting.  Denies known history of gout.        Past Medical History:  Diagnosis Date  . BPH (benign prostatic hyperplasia)   . CAP (community acquired pneumonia) 04/2019  . Empyema lung ([place]) 06/2019  . Hyperlipidemia   . Hypertension   . Obesity   . Pleural effusion on right 05/2019  . Skin cancer    BCC, SCC  . Sleep apnea   . Sleep apnea     Patient Active Problem List   Diagnosis Date Noted  . Hypokalemia 07/05/2019  . Hyperglycemia 07/05/2019  . Empyema of right pleural space ([place]) 07/05/2019  . Empyema ([place])   . Hypertension   . Hyperlipidemia   . BPH (benign prostatic hyperplasia)   . Obesity   . Sleep apnea     Past Surgical History:  Procedure Laterality Date   . APPENDECTOMY    . CATARACT EXTRACTION    . FRACTURE SURGERY      Prior to Admission medications   Medication Sig Start Date End Date Taking? Authorizing Provider  amLODipine (NORVASC) 5 MG tablet Take 5 mg by mouth daily.   Yes [provider]  amoxicillin-clavulanate (AUGMENTIN) 875-125 MG tablet Take 1 tablet by mouth 2 (two) times daily for 14 days. 07/14/19 07/28/19 Yes Sreenath, Sudheer B, MD  aspirin EC 81 MG tablet Take 81 mg by mouth 2 (two) times daily.    Yes [provider]  atorvastatin (LIPITOR) 20 MG tablet Take 20 mg by mouth at bedtime.    Yes [provider]  cetirizine (ZYRTEC) 10 MG tablet Take 10 mg by mouth daily.   Yes [provider]  hydrochlorothiazide (HYDRODIURIL) 25 MG tablet Take 25 mg by mouth daily. 08/18/18  Yes [provider]  Multiple Vitamin (MULTI-VITAMIN) tablet Take 1 tablet by mouth daily.   Yes [provider]  nebivolol (BYSTOLIC) 5 MG tablet Take 5 mg by mouth daily.   Yes [provider]  tamsulosin (FLOMAX) 0.4 MG CAPS capsule Take 0.8 mg by mouth daily after supper.    Yes [provider]  valsartan (DIOVAN) 160 MG tablet Take 160 mg by mouth daily.  10/27/18 10/27/19 Yes [provider]  sildenafil (REVATIO) 20 MG  tablet Take 20-100 mg by mouth daily as needed (ED).     [provider]  losartan (COZAAR) 100 MG tablet Take 100 mg by mouth daily. 09/05/18 01/05/19  [provider]  losartan-hydrochlorothiazide (HYZAAR) 100-12.5 MG tablet Take 1 tablet by mouth daily.  10/03/18  [provider]  Simethicone (PHAZYME MAXIMUM STRENGTH) 250 MG CAPS Take 1 tablet by mouth 2 (two) times daily as needed. 01/05/19 05/11/19  Gray, Bryan E, NP    Allergies Codeine  Family History  Problem Relation Age of Onset  . Breast cancer Mother   . Hypertension Mother   . Hyperlipidemia Mother   . Heart failure Mother   . Heart attack Father   . Thyroid cancer  Sister     Social History Social History   Tobacco Use  . Smoking status: Former Smoker    Quit date: 05/07/1967    Years since quitting: 52.2  . Smokeless tobacco: Never Used  Substance Use Topics  . Alcohol use: Yes    Comment: rarely  . Drug use: No    Review of Systems  Review of Systems  Constitutional: Positive for fatigue. Negative for chills and fever.  HENT: Negative for sore throat.   Respiratory: Negative for shortness of breath.   Cardiovascular: Negative for chest pain.  Gastrointestinal: Negative for abdominal pain.  Genitourinary: Negative for flank pain.  Musculoskeletal: Positive for arthralgias and joint swelling. Negative for neck pain.  Skin: Negative for rash and wound.  Allergic/Immunologic: Negative for immunocompromised state.  Neurological: Positive for weakness. Negative for numbness.  Hematological: Does not bruise/bleed easily.  All other systems reviewed and are negative.    ____________________________________________  PHYSICAL EXAM:      VITAL SIGNS: ED Triage Vitals  Enc Vitals Group     BP 07/18/19 0905 138/69     Pulse Rate 07/18/19 0905 87     Resp 07/18/19 0905 19     Temp 07/18/19 0905 98.3 F (36.8 C)     Temp Source 07/18/19 0905 Oral     SpO2 07/18/19 0905 98 %     Weight 07/18/19 0916 212 lb (96.2 kg)     Height 07/18/19 0916 5' 11" (1.803 m)     Head Circumference --      Peak Flow --      Pain Score 07/18/19 0916 8     Pain Loc --      Pain Edu? --      Excl. in GC? --      Physical Exam Vitals and nursing note reviewed.  Constitutional:      General: He is not in acute distress.    Appearance: He is well-developed.  HENT:     Head: Normocephalic and atraumatic.     Mouth/Throat:     Mouth: Mucous membranes are dry.  Eyes:     Conjunctiva/sclera: Conjunctivae normal.  Cardiovascular:     Rate and Rhythm: Normal rate and regular rhythm.     Heart sounds: Normal heart sounds. No murmur. No friction rub.    Pulmonary:     Effort: Pulmonary effort is normal. No respiratory distress.     Breath sounds: Normal breath sounds. No wheezing or rales.  Chest:     Comments: Right thoracostomy tube sites clean, dry, intact with minimal erythema around healing scars.  Normal work of breathing. Abdominal:     General: There is no distension.     Palpations: Abdomen is soft.     Tenderness:   There is no abdominal tenderness.  Musculoskeletal:     Cervical back: Neck supple.     Comments: Moderate erythema and warmth to the left index finger MTP joint.  Minimally painful range of motion.  Skin:    General: Skin is warm.     Capillary Refill: Capillary refill takes less than 2 seconds.  Neurological:     Mental Status: He is alert and oriented to person, place, and time.     Motor: No abnormal muscle tone.       ____________________________________________   LABS (all labs ordered are listed, but only abnormal results are displayed)  Labs Reviewed  CBC WITH DIFFERENTIAL/PLATELET - Abnormal; Notable for the following components:      Result Value   Hemoglobin 12.4 (*)    HCT 37.1 (*)    All other components within normal limits  COMPREHENSIVE METABOLIC PANEL - Abnormal; Notable for the following components:   Sodium 134 (*)    Potassium 3.3 (*)    Glucose, Bld 152 (*)    Albumin 3.3 (*)    All other components within normal limits  SEDIMENTATION RATE - Abnormal; Notable for the following components:   Sed Rate 117 (*)    All other components within normal limits  CULTURE, BLOOD (ROUTINE X 2)  CULTURE, BLOOD (ROUTINE X 2)  SARS CORONAVIRUS 2 BY RT PCR (HOSPITAL ORDER, PERFORMED IN [facility])  PROCALCITONIN  URIC ACID  C-REACTIVE PROTEIN    ____________________________________________  EKG: Normal sinus rhythm, ventricular rate 79.  QRS 161, QTc 534.  Borderline prolonged PR, left bundle branch noted.  No acute ST elevations or depressions.  No EKG evidence to suggest  acute ischemia or infarct. ________________________________________  RADIOLOGY All imaging, including plain films, CT scans, and ultrasounds, independently reviewed by me, and interpretations confirmed via formal radiology reads.  ED MD interpretation:   Chest x-ray: Persistent basilar consolidation on the right, effusion, possible empyema CT chest: Basilar consolidation on the right with persistent, small amount of fluid, partially loculated area posterior to the right lung, improved aeration of the right lung base   Official radiology report(s): DG Chest 2 View  Result Date: 07/18/2019 CLINICAL DATA:  Fever.  Chest tube removal EXAM: CHEST - 2 VIEW COMPARISON:  Jul 13, 2019 FINDINGS: Chest tube has been removed. No appreciable pneumothorax. There is a small right pleural effusion with ill-defined airspace opacity in the right mid and lower lung zones. Left lung clear. Heart size and pulmonary vascularity are normal. No adenopathy. There is mild degenerative change in the thoracic spine. IMPRESSION: No evident pneumothorax. Small right pleural effusion with ill-defined opacity in the right mid and lower lung zones, likely combination of loculated pleural effusion and atelectasis. Mild pneumonia cannot be excluded in this area. Lungs elsewhere clear. Cardiac silhouette within normal limits. Electronically Signed   By: William  Woodruff III M.D.   On: 07/18/2019 10:57   CT Chest W Contrast  Addendum Date: 07/18/2019   ADDENDUM REPORT: 07/18/2019 13:07 ADDENDUM: Enlarged and mildly heterogeneous LEFT hemi thyroid. Recommend thyroid ultrasound (ref: J Am Coll Radiol. 2015 Feb;12(2): 143-50). These results were called by telephone at the time of interpretation on 07/18/2019 at 1:07 pm to provider CAMERON ISAACS , who verbally acknowledged these results. Electronically Signed   By: Geoffrey  Wile M.D.   On: 07/18/2019 13:07   Result Date: 07/18/2019 CLINICAL DATA:  Chest pain shortness of breath. EXAM:  CT CHEST WITH CONTRAST TECHNIQUE: Multidetector CT imaging of   the chest was performed during intravenous contrast administration. CONTRAST:  75mL OMNIPAQUE IOHEXOL 300 MG/ML  SOLN COMPARISON:  07/07/2019 FINDINGS: Cardiovascular: Heart size is normal without pericardial effusion. Calcified and noncalcified plaque throughout the thoracic aorta is similar to the prior study. Central pulmonary vasculature is unremarkable on venous phase imaging. Mediastinum/Nodes: Mildly heterogeneous enlargement of the LEFT hemi thyroid. No thoracic inlet adenopathy. No axillary lymphadenopathy. No hilar lymphadenopathy. Lungs/Pleura: Basilar consolidation on the RIGHT with small amount of pleural fluid, diminished since 07/07/2019. Post removal of RIGHT-sided chest tube. Small partially loculated area posterior to consolidated lung in the RIGHT chest measuring approximately 4.3 x 2.2 cm. Unchanged in this location when compared to the previous imaging study. Near complete resolution of pleural fluid elsewhere in the chest. Improved aeration overall since the study of 07/07/2019 in the RIGHT lower lobe. Area of consolidative change shows distortion of pulmonary vasculature and bronchovascular structures extending to the posterior RIGHT chest. Upper Abdomen: Incidental imaging of upper abdominal contents is unremarkable. Musculoskeletal: Spinal degenerative changes without acute or destructive bone process. IMPRESSION: 1. Basilar consolidation on the RIGHT with small amount of pleural fluid, diminished since 07/07/2019. Post removal of RIGHT-sided chest tube. 2. Small partially loculated area posterior to consolidated lung in the RIGHT chest measuring approximately 4.3 x 2.2 cm, unchanged in this location when compared to the previous imaging study. Compatible with persistent small volume empyema a a 3. Improved aeration at the RIGHT lung base still with an area of consolidative change adjacent to small loculated collection in the  posterior RIGHT chest. This may represent partially trapped lung or developing rounded atelectasis. Continued follow-up is suggested. 4. Aortic atherosclerosis. Aortic Atherosclerosis (ICD10-I70.0). Electronically Signed: By: Geoffrey  Wile M.D. On: 07/18/2019 12:22   DG Hand Complete Left  Result Date: 07/18/2019 CLINICAL DATA:  Left hand pain and swelling described over the index finger MCP joint. EXAM: LEFT HAND - COMPLETE 3+ VIEW COMPARISON:  None. FINDINGS: No fracture or bone lesion. Mild asymmetric joint space narrowing of several of the interphalangeal joints. There is dorsal marginal spurring at the PIP joints of the second, third and fourth fingers. MCP joints are relatively well maintained. No erosions. Soft tissue prominence is noted adjacent to the spurring at the PIP joints of the second, third and fourth fingers, and along the proximal aspect of the index finger. IMPRESSION: 1. No fracture or dislocation. 2. Arthropathic changes involving the interphalangeal joints most evident at the PIP joints of the second, third and fourth fingers. Soft tissue swelling is most prominent at the base of the index finger. No joint erosions. Electronically Signed   By: David  Ormond M.D.   On: 07/18/2019 10:26    ____________________________________________  PROCEDURES   Procedure(s) performed (including Critical Care):  .1-3 Lead EKG Interpretation Performed by: Isaacs, Cameron, MD Authorized by: Isaacs, Cameron, MD     Interpretation: non-specific     ECG rate:  50-70   ECG rate assessment: bradycardic     Rhythm: sinus rhythm     Ectopy: none     Conduction: normal   Comments:     Indication: Shortness of breath, empyema    ____________________________________________  INITIAL IMPRESSION / MDM / ASSESSMENT AND PLAN / ED COURSE  As part of my medical decision making, I reviewed the following data within the electronic medical record:  Nursing notes reviewed and incorporated, Old  chart reviewed, Notes from prior ED visits, and [place] Controlled Substance Database       *Andrew Andrew   Myers was evaluated in Emergency Department on 07/18/2019 for the symptoms described in the history of present illness. He was evaluated in the context of the global COVID-19 pandemic, which necessitated consideration that the patient might be at risk for infection with the SARS-CoV-2 virus that causes COVID-19. Institutional protocols and algorithms that pertain to the evaluation of patients at risk for COVID-19 are in a state of rapid change based on information released by regulatory bodies including the CDC and federal and state organizations. These policies and algorithms were followed during the patient's care in the ED.  Some ED evaluations and interventions may be delayed as a result of limited staffing during the pandemic.*     Medical Decision Making:  73 Yo M with h/o recent empyema s/p CT placement x 2, IV ABX, here with ongoing fever, chills, cough as well as new L arm pain, L knee pain, and neck pain. On arrival, pt af and HDS but has had documented fevers >102 at home. He is non-toxic. Labs show markedly elevated ESR, normal WBC. Procal neg. CT scan shows persistent basilar consolidation on R and partially loculated area in right posterior long. Dr. Oaks of [facility] consulted, and I also ran the pt by Dr. Ravishankar. Will admit to medicine for further management.  Re: rash - primary considerations include gout, but must also consider bacteremia w/ seeding. May need further imaging pending ID and hospitalist eval.  ____________________________________________  FINAL CLINICAL IMPRESSION(S) / ED DIAGNOSES  Final diagnoses:  Empyema (HCC)  Rash     MEDICATIONS GIVEN DURING THIS VISIT:  Medications  vancomycin (VANCOREADY) IVPB 2000 mg/400 mL (has no administration in time range)  piperacillin-tazobactam (ZOSYN) IVPB 3.375 g (has no administration in time range)  iohexol (OMNIPAQUE)  300 MG/ML solution 75 mL (75 mLs Intravenous Contrast Given 07/18/19 1150)     ED Discharge Orders    None       Note:  This document was prepared using Dragon voice recognition software and may include unintentional dictation errors.   Isaacs, Cameron, MD 07/18/19 1401  

## 2019-07-18 NOTE — Progress Notes (Signed)
PHARMACY -  BRIEF ANTIBIOTIC NOTE   Pharmacy has received consult(s) for vancomycin and Zosyn from an ED provider.  The patient's profile has been reviewed for ht/wt/allergies/indication/available labs.    One time order(s) placed for Zosyn 3.375 g IV x1 and Vancomycin 2000 mg IV x1  Further antibiotics/pharmacy consults should be ordered by admitting physician if indicated.                       Thank you, Rocky Morel 07/18/2019  2:18 PM

## 2019-07-18 NOTE — H&P (Signed)
History and Physical    Andrew Myers ZJQ:[id number] DOB: [birth year] DOA: 07/18/2019  Referring MD/NP/PA:   PCP: Derinda Late, MD   Patient coming from:  The patient is coming from home.  At baseline, pt is independent for most of ADL.        Chief Complaint: fever, joint pain and neck pain  HPI: Andrew Myers is a 74 y.o. male with medical history significant of hypertension, hyperlipidemia, OSA, BPH, left bundle blockage, recent admission due to right empyema, who presents with fever, joint pain and neck pain.  Patient was recently hospitalized from 5/12-5/21 due to right empyema.  Patient is s/p for thoracentesis and had positive culture for gram-positive bacteria.  Patient is discharged on Augmentin.  Patient states that he developed fever 102.4 yesterday afternoon. He has joint pain in left index finger and right wrist.  The left index finger is red and swelling. He also has left-sided neck pain which is worse on movement from side to side. Patient denies chest pain, shortness breath, cough.  Denies nausea, vomiting, diarrhea, abdominal pain, symptoms of UTI. No unilateral numbness or tingling his extremities.  No facial droop or slurred speech  ED Course: pt was found to have WBC 9.4, pending COVID-19 PCR, uric acid 5.2, ESR 117, pending CRP, potassium 3.3, renal function okay, temperature 98.3, blood pressure 130/78, heart rate 84, oxygen saturation 98% on room air.  X-ray of left hand showed soft tissue swelling.  Patient is admitted to [facility] bed as inpatient.  ED physician consulted CT surgeon, Dr. Genevive Bi and ID, Dr. Ramon Dredge  # CT of the chest showed:  1. Basilar consolidation on the RIGHT with small amount of pleural fluid, diminished since 07/07/2019. Post removal of RIGHT-sided chest tube. 2. Small partially loculated area posterior to consolidated lung in the RIGHT chest measuring approximately 4.3 x 2.2 cm, unchanged in this location when compared to the  previous imaging study. Compatible with persistent small volume empyema a a 3. Improved aeration at the RIGHT lung base still with an area of consolidative change adjacent to small loculated collection in the posterior RIGHT chest. This may represent partially trapped lung or developing rounded atelectasis. Continued follow-up is suggested. 4. Aortic atherosclerosis. 5. Aortic Atherosclerosis (ICD10-I70.0).  Review of Systems:   General: has fevers, chills, no body weight gain, has fatigue HEENT: no blurry vision, hearing changes or sore throat Respiratory: no dyspnea, coughing, wheezing CV: no chest pain, no palpitations GI: no nausea, vomiting, abdominal pain, diarrhea, constipation GU: no dysuria, burning on urination, increased urinary frequency, hematuria  Ext: no leg edema Neuro: no unilateral weakness, numbness, or tingling, no vision change or hearing loss Skin: no rash, no skin tear. MSK: has joint pain and neck pain Heme: No easy bruising.  Travel history: No recent long distant travel.  Allergy:  Allergies  Allergen Reactions  . Codeine Rash    Past Medical History:  Diagnosis Date  . BPH (benign prostatic hyperplasia)   . CAP (community acquired pneumonia) 04/2019  . Empyema lung ([place]) 06/2019  . Hyperlipidemia   . Hypertension   . Obesity   . Pleural effusion on right 05/2019  . Skin cancer    BCC, SCC  . Sleep apnea   . Sleep apnea     Past Surgical History:  Procedure Laterality Date  . APPENDECTOMY    . CATARACT EXTRACTION    . FRACTURE SURGERY      Social History:  reports that he quit smoking about  52 years ago. He has never used smokeless tobacco. He reports current alcohol use. He reports that he does not use drugs.  Family History:  Family History  Problem Relation Age of Onset  . Breast cancer Mother   . Hypertension Mother   . Hyperlipidemia Mother   . Heart failure Mother   . Heart attack Father   . Thyroid cancer Sister      Prior  to Admission medications   Medication Sig Start Date End Date Taking? Authorizing Provider  amLODipine (NORVASC) 5 MG tablet Take 5 mg by mouth daily.    [provider]  amoxicillin-clavulanate (AUGMENTIN) 875-125 MG tablet Take 1 tablet by mouth 2 (two) times daily for 14 days. 07/14/19 07/28/19  Sidney Ace, MD  aspirin EC 81 MG tablet Take 81 mg by mouth 2 (two) times daily.     [provider]  atorvastatin (LIPITOR) 20 MG tablet Take 20 mg by mouth at bedtime.     [provider]  cetirizine (ZYRTEC) 10 MG tablet Take 10 mg by mouth daily.    [provider]  hydrochlorothiazide (HYDRODIURIL) 25 MG tablet Take 25 mg by mouth daily. 08/18/18   [provider]  Multiple Vitamin (MULTI-VITAMIN) tablet Take 1 tablet by mouth daily.    [provider]  nebivolol (BYSTOLIC) 5 MG tablet Take 5 mg by mouth daily.    [provider]  sildenafil (REVATIO) 20 MG tablet Take 20-100 mg by mouth daily as needed (ED).     [provider]  tamsulosin (FLOMAX) 0.4 MG CAPS capsule Take 0.8 mg by mouth daily after supper.     [provider]  valsartan (DIOVAN) 160 MG tablet Take 160 mg by mouth daily.  10/27/18 10/27/19  [provider]  losartan (COZAAR) 100 MG tablet Take 100 mg by mouth daily. 09/05/18 01/05/19  [provider]  losartan-hydrochlorothiazide (HYZAAR) 100-12.5 MG tablet Take 1 tablet by mouth daily.  10/03/18  [provider]  Simethicone (PHAZYME MAXIMUM STRENGTH) 250 MG CAPS Take 1 tablet by mouth 2 (two) times daily as needed. 01/05/19 05/11/19  Karen Kitchens, NP    Physical Exam: Vitals:   07/18/19 0952 07/18/19 1200 07/18/19 1430 07/18/19 1600  BP: 130/78 (!) 147/93 (!) 148/77 (!) 144/68  Pulse: 84 (!) 44 87 85  Resp: '18 20 20 16  ' Temp:      TempSrc:      SpO2: 98% 97% 98% 97%  Weight:      Height:       General: Not in acute distress HEENT:       Eyes: PERRL, EOMI, no  scleral icterus.       ENT: No discharge from the ears and nose, no pharynx injection, no tonsillar enlargement.        Neck: No JVD, no bruit, no mass felt. Heme: No neck lymph node enlargement. Cardiac: S1/S2, RRR, No murmurs, No gallops or rubs. Respiratory: No rales, wheezing, rhonchi or rubs. GI: Soft, nondistended, nontender, no rebound pain, no organomegaly, BS present. GU: No hematuria Ext: No pitting leg edema bilaterally. 2+DP/PT pulse bilaterally. Musculoskeletal: has erythema, tenderness, swelling in the MCP joint left index finger.has tenderness in right wrist and left neck without erythema. Skin: No rashes.  Neuro: Alert, oriented X3, cranial nerves II-XII grossly intact, moves all extremities normally Psych: Patient is not psychotic, no suicidal or hemocidal ideation.  Labs on Admission: I have personally reviewed following labs and imaging studies  CBC: Recent Labs  Lab 07/12/19 0520 07/14/19 0727 07/18/19 1018  WBC 8.9 9.9 9.4  NEUTROABS 6.4 7.2 7.5  HGB 11.8* 11.8* 12.4*  HCT 34.4* 34.8* 37.1*  MCV 83.3 82.7 83.2  PLT 228 224 498   Basic Metabolic Panel: Recent Labs  Lab 07/12/19 0520 07/14/19 0727 07/18/19 1018  NA 137 136 134*  K 3.5 3.2* 3.3*  CL 104 101 98  CO2 '23 25 24  ' GLUCOSE 122* 137* 152*  BUN '10 10 13  ' CREATININE 0.60* 0.41* 0.62  CALCIUM 8.6* 8.5* 9.2   GFR: Estimated Creatinine Clearance: 98.8 mL/min (by C-G formula based on SCr of 0.62 mg/dL). Liver Function Tests: Recent Labs  Lab 07/18/19 1018  AST 28  ALT 33  ALKPHOS 51  BILITOT 1.0  PROT 8.1  ALBUMIN 3.3*   No results for input(s): LIPASE, AMYLASE in the last 168 hours. No results for input(s): AMMONIA in the last 168 hours. Coagulation Profile: No results for input(s): INR, PROTIME in the last 168 hours. Cardiac Enzymes: No results for input(s): CKTOTAL, CKMB, CKMBINDEX, TROPONINI in the last 168 hours. BNP (last 3 results) No results for input(s): PROBNP in the last  8760 hours. HbA1C: No results for input(s): HGBA1C in the last 72 hours. CBG: No results for input(s): GLUCAP in the last 168 hours. Lipid Profile: No results for input(s): CHOL, HDL, LDLCALC, TRIG, CHOLHDL, LDLDIRECT in the last 72 hours. Thyroid Function Tests: No results for input(s): TSH, T4TOTAL, FREET4, T3FREE, THYROIDAB in the last 72 hours. Anemia Panel: No results for input(s): VITAMINB12, FOLATE, FERRITIN, TIBC, IRON, RETICCTPCT in the last 72 hours. Urine analysis:    Component Value Date/Time   COLORURINE YELLOW (A) 07/05/2019 2020   APPEARANCEUR CLEAR (A) 07/05/2019 2020   LABSPEC 1.019 07/05/2019 2020   PHURINE 7.0 07/05/2019 2020   GLUCOSEU NEGATIVE 07/05/2019 2020   HGBUR NEGATIVE 07/05/2019 2020   BILIRUBINUR NEGATIVE 07/05/2019 2020   KETONESUR NEGATIVE 07/05/2019 2020   PROTEINUR NEGATIVE 07/05/2019 2020   NITRITE NEGATIVE 07/05/2019 2020   LEUKOCYTESUR NEGATIVE 07/05/2019 2020   Sepsis Labs: '@LABRCNTIP' (procalcitonin:4,lacticidven:4) ) Recent Results (from the past 240 hour(s))  SARS Coronavirus 2 by RT PCR (hospital order, performed in [facility] hospital lab) Nasopharyngeal Nasopharyngeal Swab     Status: None   Collection Time: 07/18/19 12:52 PM   Specimen: Nasopharyngeal Swab  Result Value Ref Range Status   SARS Coronavirus 2 NEGATIVE NEGATIVE Final    Comment: (NOTE) SARS-CoV-2 target nucleic acids are NOT DETECTED. The SARS-CoV-2 RNA is generally detectable in upper and lower respiratory specimens during the acute phase of infection. The lowest concentration of SARS-CoV-2 viral copies this assay can detect is 250 copies / mL. A negative result does not preclude SARS-CoV-2 infection and should not be used as the sole basis for treatment or other patient management decisions.  A negative result may occur with improper specimen collection / handling, submission of specimen other than nasopharyngeal swab, presence of viral mutation(s) within  the areas targeted by this assay, and inadequate number of viral copies (<250 copies / mL). A negative result must be combined with clinical observations, patient history, and epidemiological information. Fact Sheet for Patients:   StrictlyIdeas.no Fact Sheet for Healthcare Providers: BankingDealers.co.za This test is not yet approved or cleared  by the [place] FDA and has been authorized for detection and/or diagnosis of SARS-CoV-2 by FDA under an Emergency Use Authorization (EUA).  This EUA will remain in effect (meaning this test can be  used) for the duration of the COVID-19 declaration under Section 564(b)(1) of the Act, 21 U.S.C. section 360bbb-3(b)(1), unless the authorization is terminated or revoked sooner. Performed at [facility], [address] [place], [place] [address]      Radiological Exams on Admission: DG Chest 2 View  Result Date: 07/18/2019 CLINICAL DATA:  Fever.  Chest tube removal EXAM: CHEST - 2 VIEW COMPARISON:  Jul 13, 2019 FINDINGS: Chest tube has been removed. No appreciable pneumothorax. There is a small right pleural effusion with ill-defined airspace opacity in the right mid and lower lung zones. Left lung clear. Heart size and pulmonary vascularity are normal. No adenopathy. There is mild degenerative change in the thoracic spine. IMPRESSION: No evident pneumothorax. Small right pleural effusion with ill-defined opacity in the right mid and lower lung zones, likely combination of loculated pleural effusion and atelectasis. Mild pneumonia cannot be excluded in this area. Lungs elsewhere clear. Cardiac silhouette within normal limits. Electronically Signed   By: Lowella Grip III M.D.   On: 07/18/2019 10:57   CT Chest W Contrast  Addendum Date: 07/18/2019   ADDENDUM REPORT: 07/18/2019 13:07 ADDENDUM: Enlarged and mildly heterogeneous LEFT hemi thyroid. Recommend thyroid ultrasound (ref: J Am  Coll Radiol. 2015 Feb;12(2): 143-50). These results were called by telephone at the time of interpretation on 07/18/2019 at 1:07 pm to provider Duffy Bruce , who verbally acknowledged these results. Electronically Signed   By: Zetta Bills M.D.   On: 07/18/2019 13:07   Result Date: 07/18/2019 CLINICAL DATA:  Chest pain shortness of breath. EXAM: CT CHEST WITH CONTRAST TECHNIQUE: Multidetector CT imaging of the chest was performed during intravenous contrast administration. CONTRAST:  34m OMNIPAQUE IOHEXOL 300 MG/ML  SOLN COMPARISON:  07/07/2019 FINDINGS: Cardiovascular: Heart size is normal without pericardial effusion. Calcified and noncalcified plaque throughout the thoracic aorta is similar to the prior study. Central pulmonary vasculature is unremarkable on venous phase imaging. Mediastinum/Nodes: Mildly heterogeneous enlargement of the LEFT hemi thyroid. No thoracic inlet adenopathy. No axillary lymphadenopathy. No hilar lymphadenopathy. Lungs/Pleura: Basilar consolidation on the RIGHT with small amount of pleural fluid, diminished since 07/07/2019. Post removal of RIGHT-sided chest tube. Small partially loculated area posterior to consolidated lung in the RIGHT chest measuring approximately 4.3 x 2.2 cm. Unchanged in this location when compared to the previous imaging study. Near complete resolution of pleural fluid elsewhere in the chest. Improved aeration overall since the study of 07/07/2019 in the RIGHT lower lobe. Area of consolidative change shows distortion of pulmonary vasculature and bronchovascular structures extending to the posterior RIGHT chest. Upper Abdomen: Incidental imaging of upper abdominal contents is unremarkable. Musculoskeletal: Spinal degenerative changes without acute or destructive bone process. IMPRESSION: 1. Basilar consolidation on the RIGHT with small amount of pleural fluid, diminished since 07/07/2019. Post removal of RIGHT-sided chest tube. 2. Small partially loculated  area posterior to consolidated lung in the RIGHT chest measuring approximately 4.3 x 2.2 cm, unchanged in this location when compared to the previous imaging study. Compatible with persistent small volume empyema a a 3. Improved aeration at the RIGHT lung base still with an area of consolidative change adjacent to small loculated collection in the posterior RIGHT chest. This may represent partially trapped lung or developing rounded atelectasis. Continued follow-up is suggested. 4. Aortic atherosclerosis. Aortic Atherosclerosis (ICD10-I70.0). Electronically Signed: By: GZetta BillsM.D. On: 07/18/2019 12:22   DG Hand Complete Left  Result Date: 07/18/2019 CLINICAL DATA:  Left hand pain and swelling described over the index finger  MCP joint. EXAM: LEFT HAND - COMPLETE 3+ VIEW COMPARISON:  None. FINDINGS: No fracture or bone lesion. Mild asymmetric joint space narrowing of several of the interphalangeal joints. There is dorsal marginal spurring at the PIP joints of the second, third and fourth fingers. MCP joints are relatively well maintained. No erosions. Soft tissue prominence is noted adjacent to the spurring at the PIP joints of the second, third and fourth fingers, and along the proximal aspect of the index finger. IMPRESSION: 1. No fracture or dislocation. 2. Arthropathic changes involving the interphalangeal joints most evident at the PIP joints of the second, third and fourth fingers. Soft tissue swelling is most prominent at the base of the index finger. No joint erosions. Electronically Signed   By: Lajean Manes M.D.   On: 07/18/2019 10:26     EKG: Independently reviewed.  Sinus rhythm, QTC 534, old left bundle blockade, poor R wave progression.  Assessment/Plan Principal Problem:   Empyema of right pleural space (HCC) Active Problems:   Hypertension   Hyperlipidemia   BPH (benign prostatic hyperplasia)   Hypokalemia   Neck pain   Empyema (HCC)   Joint pain   Empyema of right  pleural space [facility]): s/p of thoracentesis recently.  Still has fever. Dr. Genevive Bi of CT surgery is consulted --> planing to do right thoracotomy and decortication of lung.   -Admitted to MedSurg bed as inpatient -Start broad antibiotics, vancomycin and Zosyn -f/u blood culture -per percocet for pain  HTN:  -Continue home medications: Amlodipine, Bystolic, irbesartan -hydralazine prn -Hold HCTZ while patient is on n.p.o.  Hyperlipidemia -lipitor  BPH (benign prostatic hyperplasia): -flomax  Hypokalemia: K=3.3 on admission. - Repleted - Check Mg level  Neck pain and Joint pain: Concerning for disseminated infection -prn percocet  -On broad antibiotics as above -Will get MRI of her neck to rule out discitis    DVT ppx: SCD Code Status: Full code Family Communication: not done, no family member is at bed side.   Disposition Plan:  Anticipate discharge back to previous environment Consults called:  ED physician consulted [place] surgeon, Dr. Genevive Bi and ID, Dr. Ramon Dredge Admission status: Med-surg bed as inpt        Status is: Inpatient  Remains inpatient appropriate because:Inpatient level of care appropriate due to severity of illness   Dispo: The patient is from: Home              Anticipated d/c is to: Home              Anticipated d/c date is: 2 days              Patient currently is not medically stable to d/c.           Date of Service 07/18/2019    Ivor Costa [facility]   If 7PM-7AM, please contact night-coverage [email or web address] 07/18/2019, 5:54 PM

## 2019-07-18 NOTE — ED Notes (Signed)
Pt ambulatory to use toilet in room. Steady gait and NAD noted. Returned to bed and call bell in reach. Cardiac monitor replaced.

## 2019-07-18 NOTE — ED Triage Notes (Addendum)
Pt arrived via POV with reports of possible reaction to Augmentin that was prescribed after he was discharged from the hospital.  Pt c/o joint pain, fever, unable to turn head left to right but able to put chin to chest.  Pt was prescribed the antibiotic after being the hospital for empyema.  Pt also c/o swelling to index finger joint, reddened area noted.   Last dose of Augmentin was at 930pm.   Pt had fever at home of 102.4 at 740pm.  PT took tylenol for fever at home.

## 2019-07-19 ENCOUNTER — Inpatient Hospital Stay: Payer: PPO

## 2019-07-19 ENCOUNTER — Encounter: Payer: Self-pay | Admitting: Anesthesiology

## 2019-07-19 ENCOUNTER — Encounter
Admission: EM | Disposition: A | Discharge status: Home / Self Care | Payer: Self-pay | Source: Home / Self Care | Attending: Internal Medicine

## 2019-07-19 DIAGNOSIS — J869 Pyothorax without fistula: Secondary | ICD-10-CM

## 2019-07-19 DIAGNOSIS — R509 Fever, unspecified: Secondary | ICD-10-CM

## 2019-07-19 DIAGNOSIS — M130 Polyarthritis, unspecified: Secondary | ICD-10-CM

## 2019-07-19 LAB — CSF CELL COUNT WITH DIFFERENTIAL
Eosinophils, CSF: 0 %
Eosinophils, CSF: 0 %
Lymphs, CSF: 100 %
Lymphs, CSF: 91 %
Monocyte-Macrophage-Spinal Fluid: 0 %
Monocyte-Macrophage-Spinal Fluid: 9 %
RBC Count, CSF: 3 /mm3 (ref 0–3)
RBC Count, CSF: 3 /mm3 (ref 0–3)
Segmented Neutrophils-CSF: 0 %
Segmented Neutrophils-CSF: 0 %
Tube #: 1
Tube #: 4
WBC, CSF: 10 /mm3 — ABNORMAL HIGH (ref 0–5)
WBC, CSF: 16 /mm3 (ref 0–5)

## 2019-07-19 LAB — COMPREHENSIVE METABOLIC PANEL
ALT: 23 U/L (ref 0–44)
AST: 18 U/L (ref 15–41)
Albumin: 3 g/dL — ABNORMAL LOW (ref 3.5–5.0)
Alkaline Phosphatase: 47 U/L (ref 38–126)
Anion gap: 11 (ref 5–15)
BUN: 10 mg/dL (ref 8–23)
CO2: 24 mmol/L (ref 22–32)
Calcium: 8.6 mg/dL — ABNORMAL LOW (ref 8.9–10.3)
Chloride: 96 mmol/L — ABNORMAL LOW (ref 98–111)
Creatinine, Ser: 0.6 mg/dL — ABNORMAL LOW (ref 0.61–1.24)
GFR calc Af Amer: >60 mL/min (ref >60)
GFR calc non Af Amer: >60 mL/min (ref >60)
Glucose, Bld: 161 mg/dL — ABNORMAL HIGH (ref 70–99)
Potassium: 3.5 mmol/L (ref 3.5–5.1)
Sodium: 131 mmol/L — ABNORMAL LOW (ref 135–145)
Total Bilirubin: 1.2 mg/dL (ref 0.3–1.2)
Total Protein: 7.6 g/dL (ref 6.5–8.1)

## 2019-07-19 LAB — CRYPTOCOCCAL ANTIGEN, CSF: Crypto Ag: NEGATIVE

## 2019-07-19 LAB — CBC WITH DIFFERENTIAL/PLATELET
Abs Immature Granulocytes: 0.07 10*3/uL (ref 0.00–0.07)
Basophils Absolute: 0 10*3/uL (ref 0.0–0.1)
Basophils Relative: 0 %
Eosinophils Absolute: 0 10*3/uL (ref 0.0–0.5)
Eosinophils Relative: 0 %
HCT: 36.8 % — ABNORMAL LOW (ref 39.0–52.0)
Hemoglobin: 12.4 g/dL — ABNORMAL LOW (ref 13.0–17.0)
Immature Granulocytes: 1 %
Lymphocytes Relative: 5 %
Lymphs Abs: 0.7 10*3/uL (ref 0.7–4.0)
MCH: 28 pg (ref 26.0–34.0)
MCHC: 33.7 g/dL (ref 30.0–36.0)
MCV: 83.1 fL (ref 80.0–100.0)
Monocytes Absolute: 0.8 10*3/uL (ref 0.1–1.0)
Monocytes Relative: 6 %
Neutro Abs: 12.3 10*3/uL — ABNORMAL HIGH (ref 1.7–7.7)
Neutrophils Relative %: 88 %
Platelets: 278 10*3/uL (ref 150–400)
RBC: 4.43 MIL/uL (ref 4.22–5.81)
RDW: 13.5 % (ref 11.5–15.5)
WBC: 13.9 10*3/uL — ABNORMAL HIGH (ref 4.0–10.5)
nRBC: 0 % (ref 0.0–0.2)

## 2019-07-19 LAB — BASIC METABOLIC PANEL
Anion gap: 13 (ref 5–15)
BUN: 10 mg/dL (ref 8–23)
CO2: 21 mmol/L — ABNORMAL LOW (ref 22–32)
Calcium: 8.7 mg/dL — ABNORMAL LOW (ref 8.9–10.3)
Chloride: 96 mmol/L — ABNORMAL LOW (ref 98–111)
Creatinine, Ser: 0.69 mg/dL (ref 0.61–1.24)
GFR calc Af Amer: >60 mL/min (ref >60)
GFR calc non Af Amer: >60 mL/min (ref >60)
Glucose, Bld: 161 mg/dL — ABNORMAL HIGH (ref 70–99)
Potassium: 3.3 mmol/L — ABNORMAL LOW (ref 3.5–5.1)
Sodium: 130 mmol/L — ABNORMAL LOW (ref 135–145)

## 2019-07-19 LAB — CBC
HCT: 36.8 % — ABNORMAL LOW (ref 39.0–52.0)
Hemoglobin: 12.1 g/dL — ABNORMAL LOW (ref 13.0–17.0)
MCH: 27.6 pg (ref 26.0–34.0)
MCHC: 32.9 g/dL (ref 30.0–36.0)
MCV: 84 fL (ref 80.0–100.0)
Platelets: 261 10*3/uL (ref 150–400)
RBC: 4.38 MIL/uL (ref 4.22–5.81)
RDW: 13.3 % (ref 11.5–15.5)
WBC: 14.9 10*3/uL — ABNORMAL HIGH (ref 4.0–10.5)
nRBC: 0 % (ref 0.0–0.2)

## 2019-07-19 LAB — PROTIME-INR
INR: 1.2 (ref 0.8–1.2)
Prothrombin Time: 14.9 seconds (ref 11.4–15.2)

## 2019-07-19 LAB — HEPATITIS PANEL, ACUTE
HCV Ab: NONREACTIVE
Hep A IgM: NONREACTIVE
Hep B C IgM: NONREACTIVE
Hepatitis B Surface Ag: NONREACTIVE

## 2019-07-19 LAB — C-REACTIVE PROTEIN: CRP: 24.4 mg/dL — ABNORMAL HIGH (ref <1.0)

## 2019-07-19 LAB — SEDIMENTATION RATE: Sed Rate: 115 mm/hr — ABNORMAL HIGH (ref 0–20)

## 2019-07-19 LAB — PROCALCITONIN: Procalcitonin: 0.24 ng/mL

## 2019-07-19 LAB — APTT: aPTT: 37 seconds — ABNORMAL HIGH (ref 24–36)

## 2019-07-19 LAB — PROTEIN AND GLUCOSE, CSF
Glucose, CSF: 82 mg/dL — ABNORMAL HIGH (ref 40–70)
Total  Protein, CSF: 28 mg/dL (ref 15–45)

## 2019-07-19 LAB — CK: Total CK: 38 U/L — ABNORMAL LOW (ref 49–397)

## 2019-07-19 LAB — MAGNESIUM: Magnesium: 1.5 mg/dL — ABNORMAL LOW (ref 1.7–2.4)

## 2019-07-19 LAB — URIC ACID: Uric Acid, Serum: 3 mg/dL — ABNORMAL LOW (ref 3.7–8.6)

## 2019-07-19 SURGERY — BRONCHOSCOPY, AT BEDSIDE
Anesthesia: General

## 2019-07-19 MED ORDER — DIPHENHYDRAMINE HCL 50 MG/ML IJ SOLN: 25.0000 mg | Freq: Three times a day (TID) | INTRAMUSCULAR | Status: DC | PRN

## 2019-07-19 MED ORDER — METHYLPREDNISOLONE SODIUM SUCC 40 MG IJ SOLR
40.0000 mg | Freq: Every day | INTRAMUSCULAR | Status: DC
  Administered 2019-07-19 – 2019-07-20 (×2): 40 mg via INTRAVENOUS
  Filled 2019-07-19 (×2): qty 1

## 2019-07-19 MED ORDER — SODIUM CHLORIDE 0.9 % IV SOLN
2.0000 g | Freq: Three times a day (TID) | INTRAVENOUS | Status: DC
  Administered 2019-07-19 – 2019-07-21 (×6): 2 g via INTRAVENOUS
  Filled 2019-07-19 (×10): qty 2

## 2019-07-19 MED ORDER — SODIUM CHLORIDE 0.9 % IV SOLN: 25.0000 mg | Freq: Three times a day (TID) | INTRAVENOUS | Status: DC

## 2019-07-19 MED ORDER — DIPHENHYDRAMINE HCL 25 MG PO CAPS: 25.0000 mg | ORAL_CAPSULE | Freq: Three times a day (TID) | ORAL | Status: DC | PRN

## 2019-07-19 MED ORDER — VANCOMYCIN HCL 1250 MG/250ML IV SOLN
1250.0000 mg | Freq: Two times a day (BID) | INTRAVENOUS | Status: DC
  Administered 2019-07-19 – 2019-07-20 (×2): 1250 mg via INTRAVENOUS
  Filled 2019-07-19 (×7): qty 250

## 2019-07-19 SURGICAL SUPPLY — 67 items
APL PRP STRL LF DISP 70% ISPRP (MISCELLANEOUS) ×2
BRONCHOSCOPE BIFLEX 5.0 DISP (MISCELLANEOUS) ×3 IMPLANT
CANISTER SUCT 1200ML W/VALVE (MISCELLANEOUS) ×3 IMPLANT
CATH URET ROBINSON 16FR STRL (CATHETERS) ×3 IMPLANT
CHLORAPREP W/TINT 26 (MISCELLANEOUS) ×6 IMPLANT
CNTNR SPEC 2.5X3XGRAD LEK (MISCELLANEOUS) ×4
CONT SPEC 4OZ STER OR WHT (MISCELLANEOUS) ×4
CONT SPEC 4OZ STRL OR WHT (MISCELLANEOUS) ×4
CONTAINER SPEC 2.5X3XGRAD LEK (MISCELLANEOUS) ×8 IMPLANT
CUTTER ECHEON FLEX ENDO 45 340 (ENDOMECHANICALS) IMPLANT
DRAIN CHEST DRY SUCT SGL (MISCELLANEOUS) ×3 IMPLANT
DRAPE C-SECTION (MISCELLANEOUS) ×3 IMPLANT
DRAPE MAG INST 16X20 L/F (DRAPES) ×3 IMPLANT
DRSG OPSITE POSTOP 4X6 (GAUZE/BANDAGES/DRESSINGS) ×3 IMPLANT
DRSG OPSITE POSTOP 4X8 (GAUZE/BANDAGES/DRESSINGS) ×3 IMPLANT
ELECT BLADE 6.5 EXT (BLADE) ×3 IMPLANT
ELECT CAUTERY BLADE TIP 2.5 (TIP) ×2
ELECT REM PT RETURN 9FT ADLT (ELECTROSURGICAL) ×2
ELECTRODE CAUTERY BLDE TIP 2.5 (TIP) ×2 IMPLANT
ELECTRODE REM PT RTRN 9FT ADLT (ELECTROSURGICAL) ×2 IMPLANT
GAUZE SPONGE 4X4 12PLY STRL (GAUZE/BANDAGES/DRESSINGS) ×3 IMPLANT
GLOVE SURG SYN 7.5  E (GLOVE) ×2
GLOVE SURG SYN 7.5 E (GLOVE) ×2 IMPLANT
GLOVE SURG SYN 7.5 PF PI (GLOVE) ×2 IMPLANT
GOWN STRL REUS W/ TWL LRG LVL3 (GOWN DISPOSABLE) ×8 IMPLANT
GOWN STRL REUS W/TWL LRG LVL3 (GOWN DISPOSABLE) ×8
KIT TURNOVER KIT A (KITS) ×3 IMPLANT
LABEL OR SOLS (LABEL) ×3 IMPLANT
LOOP RED MAXI  1X406MM (MISCELLANEOUS) ×1
LOOP VESSEL MAXI 1X406 RED (MISCELLANEOUS) ×2 IMPLANT
MARKER SKIN DUAL TIP RULER LAB (MISCELLANEOUS) ×3 IMPLANT
NDL SPNL 22GX3.5 QUINCKE BK (NEEDLE) ×1 IMPLANT
NEEDLE SPNL 22GX3.5 QUINCKE BK (NEEDLE) ×2 IMPLANT
PACK BASIN MAJOR (MISCELLANEOUS) ×3 IMPLANT
RELOAD PROXIMATE TA60MM GREEN (ENDOMECHANICALS) IMPLANT
RELOAD STAPLE 35X2.5 WHT THIN (STAPLE) ×4 IMPLANT
RELOAD STAPLE 60 4.7 GRN THCK (ENDOMECHANICALS) IMPLANT
RELOAD STAPLER LINE PROX 30 GR (STAPLE) ×1 IMPLANT
SPONGE KITTNER 5P (MISCELLANEOUS) ×3 IMPLANT
STAPLE RELOAD 2.5MM WHITE (STAPLE) ×8 IMPLANT
STAPLER RELOAD LINE PROX 30 GR (STAPLE) ×2
STAPLER RELOADABLE 30 GRN THCK (STAPLE) ×1 IMPLANT
STAPLER SKIN PROX 35W (STAPLE) ×3 IMPLANT
STAPLER VASCULAR ECHELON 35 (CUTTER) IMPLANT
STRIP CLOSURE SKIN 1/2X4 (GAUZE/BANDAGES/DRESSINGS) ×3 IMPLANT
SUT MNCRL 4-0 (SUTURE)
SUT MNCRL 4-0 27XMFL (SUTURE)
SUT PROLENE 5 0 RB 1 DA (SUTURE) IMPLANT
SUT SILK 0 (SUTURE) ×2
SUT SILK 0 30XBRD TIE 6 (SUTURE) ×2 IMPLANT
SUT SILK 1 SH (SUTURE) ×21 IMPLANT
SUT VIC AB 0 CT1 36 (SUTURE) ×6 IMPLANT
SUT VIC AB 2-0 CT1 27 (SUTURE) ×4
SUT VIC AB 2-0 CT1 TAPERPNT 27 (SUTURE) ×4 IMPLANT
SUT VICRYL 2 TP 1 (SUTURE) ×9 IMPLANT
SUTURE MNCRL 4-0 27XMF (SUTURE) IMPLANT
SYR 10ML SLIP (SYRINGE) IMPLANT
SYR 20ML LL LF (SYRINGE) ×6 IMPLANT
SYR BULB IRRIG 60ML STRL (SYRINGE) ×3 IMPLANT
TAPE CLOTH 3X10 WHT NS LF (GAUZE/BANDAGES/DRESSINGS) ×3 IMPLANT
TAPE TRANSPORE STRL 2 31045 (GAUZE/BANDAGES/DRESSINGS) IMPLANT
TRAY FOLEY MTR SLVR 16FR STAT (SET/KITS/TRAYS/PACK) ×3 IMPLANT
TROCAR FLEXIPATH 20X80 (ENDOMECHANICALS) IMPLANT
TROCAR FLEXIPATH THORACIC 15MM (ENDOMECHANICALS) IMPLANT
TUBING CONNECTING 10 (TUBING) ×3 IMPLANT
WATER STERILE IRR 1000ML POUR (IV SOLUTION) ×3 IMPLANT
YANKAUER SUCT BULB TIP FLEX NO (MISCELLANEOUS) ×3 IMPLANT

## 2019-07-19 NOTE — ED Notes (Signed)
RN provided and assisted pt with phone to contact sister Vivianne Spence.

## 2019-07-19 NOTE — Consult Note (Signed)
Referring Physician:  No referring provider defined for this encounter.  Primary Physician:  Derinda Late, MD  Chief Complaint: Pain  History of Present Illness: Andrew Myers is a 74 y.o. male presently in the [facility] ED for treatment of an empyema after an intrapleural drain with recurrence of symptoms.  He complained of neck pain on admission, therefore a cervical spine MRI was completed with evidence of mild spinal canal stenosis and severe left neural foraminal stenosis at C5-6, severe bilateral C4-5 neural foraminal stenosis, and moderate right and severe left C3-4 neural foraminal stenosis. Neurosurgery was consulted given these results and patient's complaint of pain.  On interview, [name] does endorse chronic longstanding neck pain, as well as shoulder, elbow, and wrist pain bilaterally.  He denies any bowel or bladder dysfunction, saddle anesthesia, or upper and/or lower extremity weakness.  Carr Mattinson has no symptoms of cervical myelopathy.   Review of Systems:  A 10 point review of systems is negative, except for the pertinent positives and negatives detailed in the HPI.  Past Medical History: Past Medical History:  Diagnosis Date  . BPH (benign prostatic hyperplasia)   . CAP (community acquired pneumonia) 04/2019  . Empyema lung ([place]) 06/2019  . Hyperlipidemia   . Hypertension   . Obesity   . Pleural effusion on right 05/2019  . Skin cancer    BCC, SCC  . Sleep apnea   . Sleep apnea     Past Surgical History: Past Surgical History:  Procedure Laterality Date  . APPENDECTOMY    . CATARACT EXTRACTION    . FRACTURE SURGERY      Allergies: Allergies as of 07/18/2019 - Review Complete 07/18/2019  Allergen Reaction Noted  . Codeine Rash 10/19/2014    Medications:  Current Facility-Administered Medications:  .  acetaminophen (TYLENOL) tablet 650 mg, 650 mg, Oral, Q6H PRN, Ivor Costa, MD, 650 mg at 07/18/19 2036 .  amLODipine (NORVASC)  tablet 5 mg, 5 mg, Oral, Daily, Ivor Costa, MD .  aspirin EC tablet 81 mg, 81 mg, Oral, BID, Ivor Costa, MD, 81 mg at 07/18/19 2135 .  atorvastatin (LIPITOR) tablet 20 mg, 20 mg, Oral, QHS, Ivor Costa, MD, 20 mg at 07/18/19 2135 .  ceFEPIme (MAXIPIME) 2 g in sodium chloride 0.9 % 100 mL IVPB, 2 g, Intravenous, Q8H, Nazari, Walid A, RPH .  diphenhydrAMINE (BENADRYL) capsule 25 mg, 25 mg, Oral, TID PRN **OR** diphenhydrAMINE (BENADRYL) injection 25 mg, 25 mg, Intravenous, TID PRN, Lorella Nimrod, MD .  hydrALAZINE (APRESOLINE) injection 5 mg, 5 mg, Intravenous, Q2H PRN, Ivor Costa, MD .  hydrOXYzine (VISTARIL) injection 25 mg, 25 mg, Intramuscular, Q6H PRN, Ivor Costa, MD .  irbesartan (AVAPRO) tablet 150 mg, 150 mg, Oral, Daily, Ivor Costa, MD .  loratadine (CLARITIN) tablet 10 mg, 10 mg, Oral, Daily, Ivor Costa, MD, 10 mg at 07/18/19 2136 .  methylPREDNISolone sodium succinate (SOLU-MEDROL) 40 mg/mL injection 40 mg, 40 mg, Intravenous, Daily, Aleskerov, Fuad, MD .  multivitamin with minerals tablet 1 tablet, 1 tablet, Oral, Daily, Ivor Costa, MD .  nebivolol (BYSTOLIC) tablet 5 mg, 5 mg, Oral, Daily, Ivor Costa, MD .  oxyCODONE-acetaminophen (PERCOCET/ROXICET) 5-325 MG per tablet 1 tablet, 1 tablet, Oral, Q4H PRN, Ivor Costa, MD, 1 tablet at 07/19/19 0845 .  tamsulosin (FLOMAX) capsule 0.8 mg, 0.8 mg, Oral, QPC supper, Ivor Costa, MD .  vancomycin (VANCOREADY) IVPB 1250 mg/250 mL, 1,250 mg, Intravenous, Q12H, Lorella Nimrod, MD  Current Outpatient Medications:  .  amLODipine (  NORVASC) 5 MG tablet, Take 5 mg by mouth daily., Disp: , Rfl:  .  amoxicillin-clavulanate (AUGMENTIN) 875-125 MG tablet, Take 1 tablet by mouth 2 (two) times daily for 14 days., Disp: 28 tablet, Rfl: 0 .  aspirin EC 81 MG tablet, Take 81 mg by mouth 2 (two) times daily. , Disp: , Rfl:  .  atorvastatin (LIPITOR) 20 MG tablet, Take 20 mg by mouth at bedtime. , Disp: , Rfl:  .  cetirizine (ZYRTEC) 10 MG tablet, Take 10 mg by  mouth daily., Disp: , Rfl:  .  hydrochlorothiazide (HYDRODIURIL) 25 MG tablet, Take 25 mg by mouth daily., Disp: , Rfl:  .  Multiple Vitamin (MULTI-VITAMIN) tablet, Take 1 tablet by mouth daily., Disp: , Rfl:  .  nebivolol (BYSTOLIC) 5 MG tablet, Take 5 mg by mouth daily., Disp: , Rfl:  .  tamsulosin (FLOMAX) 0.4 MG CAPS capsule, Take 0.8 mg by mouth daily after supper. , Disp: , Rfl:  .  valsartan (DIOVAN) 160 MG tablet, Take 160 mg by mouth daily. , Disp: , Rfl:  .  sildenafil (REVATIO) 20 MG tablet, Take 20-100 mg by mouth daily as needed (ED). , Disp: , Rfl:    Social History: Social History   Tobacco Use  . Smoking status: Former Smoker    Quit date: 05/07/1967    Years since quitting: 52.2  . Smokeless tobacco: Never Used  Substance Use Topics  . Alcohol use: Yes    Comment: rarely  . Drug use: No    Family Medical History: Family History  Problem Relation Age of Onset  . Breast cancer Mother   . Hypertension Mother   . Hyperlipidemia Mother   . Heart failure Mother   . Heart attack Father   . Thyroid cancer Sister     Physical Examination: Vitals:   07/19/19 0537 07/19/19 0612  BP: (!) 143/78 (!) 145/68  Pulse: 93 93  Resp: 16 16  Temp:    SpO2: 94% 96%     General: Patient is well developed, well nourished, calm, collected, and in no apparent distress.  Psychiatric: Patient is non-anxious.  Head:  Pupils equal, round, and reactive to light.  Face symmetric.   NEUROLOGICAL:  General: In no acute distress.   Awake, alert, oriented to person, place, and time.  Pupils equal round and reactive to light.  Facial tone is symmetric.     ROM of spine:  Range of motion exam deferred as patient declined, just had an LP and despite reassurance, he was nervous to turn his head and neck. No tenderness over cervical spine.   Strength: Pain limited exam Side Biceps Triceps Deltoid Interossei Grip Wrist Ext. Wrist Flex.  R 5 5  patient declined exam citing painful  shoulder joints, but denied weakness 5 5 5 5   L 5 5  patient declined exam citing painful shoulder joints, but denied weakness 5 (limited exam due to effort and pain) 5 (limited exam due to effort and pain) 5 5   Side Iliopsoas Quads Hamstring PF DF  R 5 5 5 5 5   L 5 5 5 5 5    Reflexes are 2+ and symmetric at the biceps,  Brachioradialis.   Bilateral upper and lower extremity sensation is intact to light touch.  Clonus is not present.  Hoffman's is absent. Gait exam deferred as patient status post LP and needs to remain flat.  Imaging: MRI C Spine 07/18/2019: IMPRESSION: 1. Mild spinal canal stenosis and severe left  neural foraminal stenosis at C5-6. 2. Severe bilateral C4-5 neural foraminal stenosis. 3. Moderate right and severe left C3-4 neural foraminal stenosis.    Assessment and Plan: Mr. Kool is a pleasant 74 y.o. male with neck pain and MRI concerning for severe bilateral C4-5 neural foraminal stenosis.  He has no evidence of cervical myelopathy on exam.  MRI reviewed with Dr. Cari Caraway.  MRI findings should not interfere with rapid sequence intubation, however, can use glide scope if there is a concern..  There is no acute neurosurgical intervention at this time, and we will plan to arrange follow-up with him as an outpatient at our clinic.   Lonell Face, NP Dept. of Neurosurgery

## 2019-07-19 NOTE — Consult Note (Signed)
NAME: Andrew Myers  DOB: 10/10/1945  MRN: [id number]  Date/Time: 07/19/2019 3:35 PM  REQUESTING PROVIDER: Dr. Cristi Loron Subjective:  REASON FOR CONSULT: Abnormal CSF ? [name] is a 74 y.o. male was recently discharged from the hospital on 07/14/2019 is admitted with  Fever and polyarthralgia.  Patient has a history of CAD, hyperlipidemia, hypertension and sleep apnea and was recently admitted to the hospital between 07/04/2017 until 07/14/2019 for right empyema.  Prior to that patient has had cough for almost 6 weeks.  And had gotten antibiotics as outpatient with Levaquin, Zithromax and Augmentin.  His PCP had ordered a CT chest and it showed loculated pleural effusion on the right side he was sent to see the pulmonologist on 07/04/2019 who recommended IR drain the pleural fluid which was done on 07/04/2019.  The pleural fluid was neutrophil predominance and high LDH and empyema was suspected and he was sent to the ED for admission.  During his last hospitalization on 5/12/2021He had chest drain placed on the right side and also had TPA instilled.  He was initially given IV Zosyn.  As the cultures were negative it was switched to Unasyn On 07/08/2019.  As the loculated fluid was not completely drained with 1 drain he had another drain placed during that stay.  Both the drains were removed before his discharge.  On discharge he was switched over to p.o. Augmentin.  The plan was to give him at least 2 more weeks of antibiotics.  Patient says on the day of discharge his temperature was 100.  When he went home it went up to 101.4.  The next day he was walking in his yard and his temperature was maximum 100.7 on Sunday he had pain in his knees and his fingers. On Monday, 07/17/2019 he went to see his PCP and his PCPs note does not mention anything about a fever. Patient says at night he had a temperature of 102 and had to take Advil and Tylenol.  Next day he had pain in his shoulders elbows  hips and back and neck he came to the hospital. In the ED his vitals were 138/69, heart rate of 87, temperature of 98.3 and respiratory rate of 19.  Labs revealed a hemoglobin of 12.4, WBC of 9.4 a creatinine nd platelet of 279 with pro calcitonin less than 0.10.  Creatinine was 0.62 blood cultures were sent. He was started on vancomycin and Zosyn.  The latter was changed to cefepime.  Patient also underwent lumbar puncture because of neck pain and I am asked to see the patient to interpret the results and manage the patient. Patient denies any tick bites No rash No nausea vomiting or diarrhea No cough or shortness of breath or chest pain. Patient in a monogamous relationship with a male partner. Has not been sexually active in a few weeks.  Past Medical History:  Diagnosis Date  . BPH (benign prostatic hyperplasia)   . CAP (community acquired pneumonia) 04/2019  . Empyema lung ([place]) 06/2019  . Hyperlipidemia   . Hypertension   . Obesity   . Pleural effusion on right 05/2019  . Skin cancer    BCC, SCC  . Sleep apnea   . Sleep apnea     Past Surgical History:  Procedure Laterality Date  . APPENDECTOMY    . CATARACT EXTRACTION    . FRACTURE SURGERY      Social History   Socioeconomic History  . Marital status: Married  Spouse name: Not on file  . Number of children: Not on file  . Years of education: Not on file  . Highest education level: Not on file  Occupational History  . Not on file  Tobacco Use  . Smoking status: Former Smoker    Quit date: 05/07/1967    Years since quitting: 52.2  . Smokeless tobacco: Never Used  Substance and Sexual Activity  . Alcohol use: Yes    Comment: rarely  . Drug use: No  . Sexual activity: Not on file  Other Topics Concern  . Not on file  Social History Narrative  . Not on file   Social Determinants of Health   Financial Resource Strain:   . Difficulty of Paying Living Expenses:   Food Insecurity:   . Worried About Paediatric nurse in the Last Year:   . Arboriculturist in the Last Year:   Transportation Needs:   . Film/video editor (Medical):   Marland Kitchen Lack of Transportation (Non-Medical):   Physical Activity:   . Days of Exercise per Week:   . Minutes of Exercise per Session:   Stress:   . Feeling of Stress :   Social Connections:   . Frequency of Communication with Friends and Family:   . Frequency of Social Gatherings with Friends and Family:   . Attends Religious Services:   . Active Member of Clubs or Organizations:   . Attends Archivist Meetings:   Marland Kitchen Marital Status:   Intimate Partner Violence:   . Fear of Current or Ex-Partner:   . Emotionally Abused:   Marland Kitchen Physically Abused:   . Sexually Abused:     Family History  Problem Relation Age of Onset  . Breast cancer Mother   . Hypertension Mother   . Hyperlipidemia Mother   . Heart failure Mother   . Heart attack Father   . Thyroid cancer Sister    Allergies  Allergen Reactions  . Codeine Rash  ? Current Facility-Administered Medications  Medication Dose Route Frequency Provider Last Rate Last Admin  . acetaminophen (TYLENOL) tablet 650 mg  650 mg Oral Q6H PRN Ivor Costa, MD   650 mg at 07/18/19 2036  . amLODipine (NORVASC) tablet 5 mg  5 mg Oral Daily Ivor Costa, MD      . aspirin EC tablet 81 mg  81 mg Oral BID Ivor Costa, MD   81 mg at 07/18/19 2135  . atorvastatin (LIPITOR) tablet 20 mg  20 mg Oral QHS Ivor Costa, MD   20 mg at 07/18/19 2135  . ceFEPIme (MAXIPIME) 2 g in sodium chloride 0.9 % 100 mL IVPB  2 g Intravenous Q8H Nazari, Walid A, RPH   Stopped at 07/19/19 1416  . diphenhydrAMINE (BENADRYL) capsule 25 mg  25 mg Oral TID PRN Lorella Nimrod, MD       Or  . diphenhydrAMINE (BENADRYL) injection 25 mg  25 mg Intravenous TID PRN Lorella Nimrod, MD      . hydrALAZINE (APRESOLINE) injection 5 mg  5 mg Intravenous Q2H PRN Ivor Costa, MD      . hydrOXYzine (VISTARIL) injection 25 mg  25 mg Intramuscular Q6H PRN Ivor Costa, MD      . irbesartan (AVAPRO) tablet 150 mg  150 mg Oral Daily Ivor Costa, MD      . loratadine (CLARITIN) tablet 10 mg  10 mg Oral Daily Ivor Costa, MD   10 mg at 07/18/19 2136  .  methylPREDNISolone sodium succinate (SOLU-MEDROL) 40 mg/mL injection 40 mg  40 mg Intravenous Daily Ottie Glazier, MD   40 mg at 07/19/19 1235  . multivitamin with minerals tablet 1 tablet  1 tablet Oral Daily Ivor Costa, MD      . nebivolol (BYSTOLIC) tablet 5 mg  5 mg Oral Daily Ivor Costa, MD   Stopped at 07/19/19 1239  . oxyCODONE-acetaminophen (PERCOCET/ROXICET) 5-325 MG per tablet 1 tablet  1 tablet Oral Q4H PRN Ivor Costa, MD   1 tablet at 07/19/19 0845  . tamsulosin (FLOMAX) capsule 0.8 mg  0.8 mg Oral QPC supper Ivor Costa, MD      . vancomycin (VANCOREADY) IVPB 1250 mg/250 mL  1,250 mg Intravenous Q12H Lorella Nimrod, MD         Abtx:  Anti-infectives (From admission, onward)   Start     Dose/Rate Route Frequency Ordered Stop   07/19/19 1000  ceFEPIme (MAXIPIME) 2 g in sodium chloride 0.9 % 100 mL IVPB     2 g 200 mL/hr over 30 Minutes Intravenous Every 8 hours 07/19/19 0922     07/19/19 0730  vancomycin (VANCOREADY) IVPB 1250 mg/250 mL     1,250 mg 166.7 mL/hr over 90 Minutes Intravenous Every 12 hours 07/19/19 0732     07/19/19 0600  vancomycin (VANCOREADY) IVPB 1250 mg/250 mL  Status:  Discontinued     1,250 mg 166.7 mL/hr over 90 Minutes Intravenous Every 12 hours 07/18/19 1905 07/19/19 0732   07/18/19 2000  piperacillin-tazobactam (ZOSYN) IVPB 3.375 g  Status:  Discontinued     3.375 g 12.5 mL/hr over 240 Minutes Intravenous Every 8 hours 07/18/19 1828 07/19/19 0914   07/18/19 1400  piperacillin-tazobactam (ZOSYN) IVPB 3.375 g     3.375 g 100 mL/hr over 30 Minutes Intravenous  Once 07/18/19 1353 07/18/19 1445   07/18/19 1345  vancomycin (VANCOREADY) IVPB 2000 mg/400 mL     2,000 mg 200 mL/hr over 120 Minutes Intravenous  Once 07/18/19 1340 07/18/19 1842   07/18/19 1315   Ampicillin-Sulbactam (UNASYN) 3 g in sodium chloride 0.9 % 100 mL IVPB  Status:  Discontinued     3 g 200 mL/hr over 30 Minutes Intravenous  Once 07/18/19 1303 07/18/19 1333      REVIEW OF SYSTEMS:  Const:  fever,  chills, negative weight loss Eyes: negative diplopia or visual changes, negative eye pain ENT: negative coryza, negative sore throat Resp: negative cough, hemoptysis, dyspnea Cards: negative for chest pain, palpitations, lower extremity edema GU: negative for frequency, dysuria and hematuria GI: Negative for abdominal pain, diarrhea, bleeding, constipation Skin: negative for rash and pruritus Heme: negative for easy bruising and gum/nose bleeding MS: Joint pains, back pain and neck pain Neurolo:negative for headaches, dizziness, vertigo, memory problems  Psych: negative for feelings of anxiety, depression  Endocrine: negative for thyroid, diabetes Allergy/Immunology-as above Objective:  VITALS:  BP (!) 154/71 (BP Location: Right Arm)   Pulse 86   Temp 98.6 F (37 C) (Oral)   Resp 15   Ht '5\' 11"'  (1.803 m)   Wt 96.2 kg   SpO2 97%   BMI 29.57 kg/m  PHYSICAL EXAM:  General: Alert, cooperative, no distress, appears young for age Head: Normocephalic, without obvious abnormality, atraumatic. Eyes: Conjunctivae clear, anicteric sclerae. Pupils are equal ENT Nares normal. No drainage or sinus tenderness. Lips, mucosa, and tongue normal. No Thrush Neck: Supple, symmetrical, no adenopathy, thyroid: non tender no carotid bruit and no JVD. Back: No CVA tenderness. Lungs: Clear to  auscultation bilaterally. No Wheezing or Rhonchi. No rales. Heart: Regular rate and rhythm, no murmur, rub or gallop. Abdomen: Soft, non-tender,not distended. Bowel sounds normal. No masses Extremities: Tender MCP joints, erythema and swelling of the first MCP on the right and first and third on the left bilateral hip tender and painful on abduction and flexion, bilateral wrist tenderness and  minimal swelling       skin: No rashes or lesions. Or bruising Lymph: Cervical, supraclavicular normal. Neurologic: Grossly non-focal Pertinent Labs Lab Results CBC    Component Value Date/Time   WBC 13.9 (H) 07/19/2019 1403   RBC 4.43 07/19/2019 1403   HGB 12.4 (L) 07/19/2019 1403   HCT 36.8 (L) 07/19/2019 1403   PLT 278 07/19/2019 1403   MCV 83.1 07/19/2019 1403   MCH 28.0 07/19/2019 1403   MCHC 33.7 07/19/2019 1403   RDW 13.5 07/19/2019 1403   LYMPHSABS 0.7 07/19/2019 1403   MONOABS 0.8 07/19/2019 1403   EOSABS 0.0 07/19/2019 1403   BASOSABS 0.0 07/19/2019 1403    CMP Latest Ref Rng & Units 07/19/2019 07/19/2019 07/18/2019  Glucose 70 - 99 mg/dL 161(H) 161(H) 152(H)  BUN 8 - 23 mg/dL '10 10 13  ' Creatinine 0.61 - 1.24 mg/dL 0.60(L) 0.69 0.62  Sodium 135 - 145 mmol/L 131(L) 130(L) 134(L)  Potassium 3.5 - 5.1 mmol/L 3.5 3.3(L) 3.3(L)  Chloride 98 - 111 mmol/L 96(L) 96(L) 98  CO2 22 - 32 mmol/L 24 21(L) 24  Calcium 8.9 - 10.3 mg/dL 8.6(L) 8.7(L) 9.2  Total Protein 6.5 - 8.1 g/dL 7.6 - 8.1  Total Bilirubin 0.3 - 1.2 mg/dL 1.2 - 1.0  Alkaline Phos 38 - 126 U/L 47 - 51  AST 15 - 41 U/L 18 - 28  ALT 0 - 44 U/L 23 - 33      Microbiology: Recent Results (from the past 240 hour(s))  Blood culture (routine x 2)     Status: None (Preliminary result)   Collection Time: 07/18/19 10:18 AM   Specimen: BLOOD  Result Value Ref Range Status   Specimen Description BLOOD LEFT AC  Final   Special Requests   Final    BOTTLES DRAWN AEROBIC AND ANAEROBIC Blood Culture adequate volume   Culture   Final    NO GROWTH < 24 HOURS Performed at [facility], [address] [place], [place] [address]    Report Status PENDING  Incomplete  Blood culture (routine x 2)     Status: None (Preliminary result)   Collection Time: 07/18/19 10:19 AM   Specimen: BLOOD  Result Value Ref Range Status   Specimen Description BLOOD LEFT HAND  Final   Special Requests   Final    BOTTLES  DRAWN AEROBIC AND ANAEROBIC Blood Culture adequate volume   Culture   Final    NO GROWTH < 24 HOURS Performed at [facility], [address] [place], [place] [address]    Report Status PENDING  Incomplete  SARS Coronavirus 2 by RT PCR (hospital order, performed in [place] hospital lab) Nasopharyngeal Nasopharyngeal Swab     Status: None   Collection Time: 07/18/19 12:52 PM   Specimen: Nasopharyngeal Swab  Result Value Ref Range Status   SARS Coronavirus 2 NEGATIVE NEGATIVE Final    Comment: (NOTE) SARS-CoV-2 target nucleic acids are NOT DETECTED. The SARS-CoV-2 RNA is generally detectable in upper and lower respiratory specimens during the acute phase of infection. The lowest concentration of SARS-CoV-2 viral copies this assay can detect is  250 copies / mL. A negative result does not preclude SARS-CoV-2 infection and should not be used as the sole basis for treatment or other patient management decisions.  A negative result may occur with improper specimen collection / handling, submission of specimen other than nasopharyngeal swab, presence of viral mutation(s) within the areas targeted by this assay, and inadequate number of viral copies (<250 copies / mL). A negative result must be combined with clinical observations, patient history, and epidemiological information. Fact Sheet for Patients:   StrictlyIdeas.no Fact Sheet for Healthcare Providers: BankingDealers.co.za This test is not yet approved or cleared  by the [place] FDA and has been authorized for detection and/or diagnosis of SARS-CoV-2 by FDA under an Emergency Use Authorization (EUA).  This EUA will remain in effect (meaning this test can be used) for the duration of the COVID-19 declaration under Section 564(b)(1) of the Act, 21 U.S.C. section 360bbb-3(b)(1), unless the authorization is terminated or revoked sooner. Performed at [facility],  [address] [place], [place] [address]   CSF culture     Status: None (Preliminary result)   Collection Time: 07/19/19  9:16 AM   Specimen: Cerebrospinal Fluid  Result Value Ref Range Status   Specimen Description CSF  Final   Special Requests NONE  Final   Gram Stain   Final    WBC SEEN RED BLOOD CELLS NO ORGANISMS SEEN Performed at [facility], [address], [place], [place] [address]    Culture PENDING  Incomplete   Report Status PENDING  Incomplete    IMAGING RESULTS:  I have personally reviewed the films ? Impression/Recommendation ?74 year old male who was recently in the hospital for right sided empyema and was treated with chest drains, TPA and IV antibiotics and was sent home on p.o. Augmentin on 07/14/2019 presents to the hospital on 07/18/2019 with fever and polyarthralgia.  Fever and polyarthritis Polyarthritis  symmetrical involving the wrists, MCP knees and hips. Polyarthralgia elbows and shoulders Fever present since the day he was discharged from the hospital.  High ESR  The differential diagnosis is  serum sickness due to amoxicillin clavulanate. Viral illness Autoimmune illness Unlikely Lyme or Ehrlichia Gonococcal polyarthritis is unlikely Agree with methylprednisone We will send C4, C3, rheumatoid factor, parvovirus, hepatitis panel.  Lyme antibodies, await blood cultures.   Continue Vanco and cefepime. ? __Lymphocytic pleocytosis and CSF.  WBC was 10 but normal protein and normal glucose does not know the significance of this.  This is not an infection.   aseptic meningitis-like picture but could be from the serum sickness as well.   Recent right empyema status post chest  drains and antibiotics.  Small fluid collection persist 4 cm x 4 cm.  We will hold off on further surgical intervention until we know exactly what is going on with him.  _________________________________________________ Discussed with patient in great detail.  Note:   This document was prepared using Dragon voice recognition software and may include unintentional dictation errors.

## 2019-07-19 NOTE — Progress Notes (Signed)
Pharmacy Antibiotic Note  Terence Santisteban is a 74 y.o. male admitted on 07/18/2019 with empyema and joint pain, also with concern for possible meningitis. Pharmacy has been consulted for Cefepime and vancomycin dosing.  Plan: Cefepime 2g IV Q8 hours  Vancomycin 2g IV x1 given in the ED Will continue dosing with vancomycin 1250 mg IV q12h (PK dosing with SCr ~1) Goal trough 15-20 mcg/ml  Will need to closely monitoring renal function with Zosyn and Vancomycin    Height: 5\' 11"  (180.3 cm) Weight: 96.2 kg (212 lb) IBW/kg (Calculated) : 75.3  No data recorded.  Recent Labs  Lab 07/14/19 0727 07/18/19 1018 07/19/19 0447  WBC 9.9 9.4 14.9*  CREATININE 0.41* 0.62 0.69    Estimated Creatinine Clearance: 98.8 mL/min (by C-G formula based on SCr of 0.69 mg/dL).    Allergies  Allergen Reactions  . Codeine Rash    Antimicrobials this admission: Zosyn 5/25>>5/26 Vancomycin 5/25 >> Cefepime 5/26 >>  Dose adjustments this admission:   Microbiology results: 5/25 BCx: collected    Thank you for allowing pharmacy to be a part of this patient's care.  Rinoa Garramone A Yareth Kearse 07/19/2019 9:23 AM

## 2019-07-19 NOTE — Consult Note (Signed)
Reason for Consult:Neck pain, possible meningitis Requesting Physician: Lanney Gins  CC: Neck pain  I have been asked by Dr. Lanney Gins to see this patient in consultation for possible meningitis.  HPI: Andrew Myers is an 74 y.o. male with medical history significant of hypertension, hyperlipidemia, OSA, BPH, left bundle blockage, recent admission due to right empyema, who presents with fever, joint pain and neck pain.  Patient was recently hospitalized from 5/12-5/21 due to right empyema.  Patient is s/p for thoracentesis and had positive culture for gram-positive bacteria.  Patient is discharged on Augmentin.  Patient states that he developed fever 102.4 yesterday afternoon. He has joint pain throughout.  The left index finger is red and swelling. He also has left-sided neck pain which is worse on movement from side to side. There has been no altered mental status or cranial nerve abnormalities.   Past Medical History:  Diagnosis Date  . BPH (benign prostatic hyperplasia)   . CAP (community acquired pneumonia) 04/2019  . Empyema lung ([place]) 06/2019  . Hyperlipidemia   . Hypertension   . Obesity   . Pleural effusion on right 05/2019  . Skin cancer    BCC, SCC  . Sleep apnea   . Sleep apnea     Past Surgical History:  Procedure Laterality Date  . APPENDECTOMY    . CATARACT EXTRACTION    . FRACTURE SURGERY      Family History  Problem Relation Age of Onset  . Breast cancer Mother   . Hypertension Mother   . Hyperlipidemia Mother   . Heart failure Mother   . Heart attack Father   . Thyroid cancer Sister     Social History:  reports that he quit smoking about 52 years ago. He has never used smokeless tobacco. He reports current alcohol use. He reports that he does not use drugs.  Allergies  Allergen Reactions  . Codeine Rash    Medications:  I have reviewed the patient's current medications. Prior to Admission:  Medications Prior to Admission  Medication Sig  Dispense Refill Last Dose  . amLODipine (NORVASC) 5 MG tablet Take 5 mg by mouth daily.   07/18/2019 at 0800  . amoxicillin-clavulanate (AUGMENTIN) 875-125 MG tablet Take 1 tablet by mouth 2 (two) times daily for 14 days. 28 tablet 0 07/17/2019 at 2130  . aspirin EC 81 MG tablet Take 81 mg by mouth 2 (two) times daily.    07/18/2019 at 0800  . atorvastatin (LIPITOR) 20 MG tablet Take 20 mg by mouth at bedtime.    07/17/2019 at 2000  . cetirizine (ZYRTEC) 10 MG tablet Take 10 mg by mouth daily.   07/17/2019 at 0800  . hydrochlorothiazide (HYDRODIURIL) 25 MG tablet Take 25 mg by mouth daily.   07/18/2019 at 0800  . Multiple Vitamin (MULTI-VITAMIN) tablet Take 1 tablet by mouth daily.   07/18/2019 at 0800  . nebivolol (BYSTOLIC) 5 MG tablet Take 5 mg by mouth daily.   07/18/2019 at 0800  . tamsulosin (FLOMAX) 0.4 MG CAPS capsule Take 0.8 mg by mouth daily after supper.    07/17/2019 at 2000  . valsartan (DIOVAN) 160 MG tablet Take 160 mg by mouth daily.    07/18/2019 at 0800  . sildenafil (REVATIO) 20 MG tablet Take 20-100 mg by mouth daily as needed (ED).    unknown at prn   Scheduled: . amLODipine  5 mg Oral Daily  . aspirin EC  81 mg Oral BID  . atorvastatin  20 mg  Oral QHS  . irbesartan  150 mg Oral Daily  . loratadine  10 mg Oral Daily  . methylPREDNISolone (SOLU-MEDROL) injection  40 mg Intravenous Daily  . multivitamin with minerals  1 tablet Oral Daily  . nebivolol  5 mg Oral Daily  . tamsulosin  0.8 mg Oral QPC supper    ROS: History obtained from the patient  General ROS: negative for - chills, fatigue, fever, night sweats, weight gain or weight loss Psychological ROS: negative for - behavioral disorder, hallucinations, memory difficulties, mood swings or suicidal ideation Ophthalmic ROS: negative for - blurry vision, double vision, eye pain or loss of vision ENT ROS: negative for - epistaxis, nasal discharge, oral lesions, sore throat, tinnitus or vertigo Allergy and Immunology ROS:  negative for - hives or itchy/watery eyes Hematological and Lymphatic ROS: negative for - bleeding problems, bruising or swollen lymph nodes Endocrine ROS: negative for - galactorrhea, hair pattern changes, polydipsia/polyuria or temperature intolerance Respiratory ROS: negative for - cough, hemoptysis, shortness of breath or wheezing Cardiovascular ROS: negative for - chest pain, dyspnea on exertion, edema or irregular heartbeat Gastrointestinal ROS: negative for - abdominal pain, diarrhea, hematemesis, nausea/vomiting or stool incontinence Genito-Urinary ROS: negative for - dysuria, hematuria, incontinence or urinary frequency/urgency Musculoskeletal ROS: joint pain and swelling Neurological ROS: as noted in HPI Dermatological ROS: negative for rash and skin lesion changes  Physical Examination: Blood pressure (!) 156/86, pulse (!) 56, temperature 97.9 F (36.6 C), temperature source Oral, resp. rate 15, height 5\' 11"  (1.803 m), weight 96.2 kg, SpO2 96 %.  HEENT-  Normocephalic, no lesions, without obvious abnormality.  Normal external eye and conjunctiva.  Normal TM's bilaterally.  Normal auditory canals and external ears. Normal external nose, mucus membranes and septum.  Normal pharynx. Cardiovascular- S1, S2 normal, pulses palpable throughout   Abdomen- soft, non-tender; bowel sounds normal; no masses,  no organomegaly Extremities- no edema Lymph-no adenopathy palpable Musculoskeletal-joints erythematous and swollen Skin-warm and dry, no hyperpigmentation, vitiligo, or suspicious lesions  Neurological Examination   Mental Status: Alert, oriented, thought content appropriate.  Speech fluent without evidence of aphasia.  Able to follow 3 step commands without difficulty. Cranial Nerves: II: Visual fields grossly normal, pupils equal, round, reactive to light and accommodation III,IV, VI: ptosis not present, extra-ocular motions intact bilaterally V,VII: smile symmetric, facial light  touch sensation normal bilaterally VIII: hearing normal bilaterally IX,X: gag reflex present XI: bilateral shoulder shrug XII: midline tongue extension Motor: Limited extremity movement due to pain Sensory: Pinprick and light touch intact throughout, bilaterally Deep Tendon Reflexes: Symmetric throughout Plantars: Right: mute   Left: mute Cerebellar: Normal finger-to-nose testing bilaterally Gait: not tested due to safety concerns    Laboratory Studies:   Basic Metabolic Panel: Recent Labs  Lab 07/14/19 0727 07/14/19 0727 07/18/19 1018 07/19/19 0447 07/19/19 1403  NA 136  --  134* 130* 131*  K 3.2*  --  3.3* 3.3* 3.5  CL 101  --  98 96* 96*  CO2 25  --  24 21* 24  GLUCOSE 137*  --  152* 161* 161*  BUN 10  --  13 10 10   CREATININE 0.41*  --  0.62 0.69 0.60*  CALCIUM 8.5*   < > 9.2 8.7* 8.6*  MG  --   --   --  1.5*  --    < > = values in this interval not displayed.    Liver Function Tests: Recent Labs  Lab 07/18/19 1018 07/19/19 1403  AST 28 18  ALT 33 23  ALKPHOS 51 47  BILITOT 1.0 1.2  PROT 8.1 7.6  ALBUMIN 3.3* 3.0*   No results for input(s): LIPASE, AMYLASE in the last 168 hours. No results for input(s): AMMONIA in the last 168 hours.  CBC: Recent Labs  Lab 07/14/19 0727 07/18/19 1018 07/19/19 0447 07/19/19 1403  WBC 9.9 9.4 14.9* 13.9*  NEUTROABS 7.2 7.5  --  12.3*  HGB 11.8* 12.4* 12.1* 12.4*  HCT 34.8* 37.1* 36.8* 36.8*  MCV 82.7 83.2 84.0 83.1  PLT 224 279 261 278    Cardiac Enzymes: Recent Labs  Lab 07/19/19 1535  CKTOTAL 38*    BNP: Invalid input(s): POCBNP  CBG: No results for input(s): GLUCAP in the last 168 hours.  Microbiology: Results for orders placed or performed during the hospital encounter of 07/18/19  Blood culture (routine x 2)     Status: None (Preliminary result)   Collection Time: 07/18/19 10:18 AM   Specimen: BLOOD  Result Value Ref Range Status   Specimen Description BLOOD LEFT AC  Final   Special  Requests   Final    BOTTLES DRAWN AEROBIC AND ANAEROBIC Blood Culture adequate volume   Culture   Final    NO GROWTH < 24 HOURS Performed at [facility], [address] [place], [place] [address]    Report Status PENDING  Incomplete  Blood culture (routine x 2)     Status: None (Preliminary result)   Collection Time: 07/18/19 10:19 AM   Specimen: BLOOD  Result Value Ref Range Status   Specimen Description BLOOD LEFT HAND  Final   Special Requests   Final    BOTTLES DRAWN AEROBIC AND ANAEROBIC Blood Culture adequate volume   Culture   Final    NO GROWTH < 24 HOURS Performed at [facility], [address] [place], [place] [address]    Report Status PENDING  Incomplete  SARS Coronavirus 2 by RT PCR (hospital order, performed in [facility] hospital lab) Nasopharyngeal Nasopharyngeal Swab     Status: None   Collection Time: 07/18/19 12:52 PM   Specimen: Nasopharyngeal Swab  Result Value Ref Range Status   SARS Coronavirus 2 NEGATIVE NEGATIVE Final    Comment: (NOTE) SARS-CoV-2 target nucleic acids are NOT DETECTED. The SARS-CoV-2 RNA is generally detectable in upper and lower respiratory specimens during the acute phase of infection. The lowest concentration of SARS-CoV-2 viral copies this assay can detect is 250 copies / mL. A negative result does not preclude SARS-CoV-2 infection and should not be used as the sole basis for treatment or other patient management decisions.  A negative result may occur with improper specimen collection / handling, submission of specimen other than nasopharyngeal swab, presence of viral mutation(s) within the areas targeted by this assay, and inadequate number of viral copies (<250 copies / mL). A negative result must be combined with clinical observations, patient history, and epidemiological information. Fact Sheet for Patients:   StrictlyIdeas.no Fact Sheet for Healthcare  Providers: BankingDealers.co.za This test is not yet approved or cleared  by the [place] FDA and has been authorized for detection and/or diagnosis of SARS-CoV-2 by FDA under an Emergency Use Authorization (EUA).  This EUA will remain in effect (meaning this test can be used) for the duration of the COVID-19 declaration under Section 564(b)(1) of the Act, 21 U.S.C. section 360bbb-3(b)(1), unless the authorization is terminated or revoked sooner. Performed at [facility], [address] [place], [place] [address]  CSF culture     Status: None (Preliminary result)   Collection Time: 07/19/19  9:16 AM   Specimen: Cerebrospinal Fluid  Result Value Ref Range Status   Specimen Description CSF  Final   Special Requests NONE  Final   Gram Stain   Final    WBC SEEN RED BLOOD CELLS NO ORGANISMS SEEN Performed at [facility], [address] [place], [place] [address]    Culture PENDING  Incomplete   Report Status PENDING  Incomplete    Coagulation Studies: Recent Labs    07/19/19 0447  LABPROT 14.9  INR 1.2    Urinalysis: No results for input(s): COLORURINE, LABSPEC, PHURINE, GLUCOSEU, HGBUR, BILIRUBINUR, KETONESUR, PROTEINUR, UROBILINOGEN, NITRITE, LEUKOCYTESUR in the last 168 hours.  Invalid input(s): APPERANCEUR  Lipid Panel:  No results found for: CHOL, TRIG, HDL, CHOLHDL, VLDL, LDLCALC  HgbA1C: No results found for: HGBA1C  Urine Drug Screen:  No results found for: LABOPIA, COCAINSCRNUR, LABBENZ, AMPHETMU, THCU, LABBARB  Alcohol Level: No results for input(s): ETH in the last 168 hours.  Other results: EKG: sinus rhythm at 79 bpm, LBBB.  Imaging: DG Chest 2 View  Result Date: 07/18/2019 CLINICAL DATA:  Fever.  Chest tube removal EXAM: CHEST - 2 VIEW COMPARISON:  Jul 13, 2019 FINDINGS: Chest tube has been removed. No appreciable pneumothorax. There is a small right pleural effusion with ill-defined airspace opacity in  the right mid and lower lung zones. Left lung clear. Heart size and pulmonary vascularity are normal. No adenopathy. There is mild degenerative change in the thoracic spine. IMPRESSION: No evident pneumothorax. Small right pleural effusion with ill-defined opacity in the right mid and lower lung zones, likely combination of loculated pleural effusion and atelectasis. Mild pneumonia cannot be excluded in this area. Lungs elsewhere clear. Cardiac silhouette within normal limits. Electronically Signed   By: Lowella Grip III M.D.   On: 07/18/2019 10:57   CT Chest W Contrast  Addendum Date: 07/18/2019   ADDENDUM REPORT: 07/18/2019 13:07 ADDENDUM: Enlarged and mildly heterogeneous LEFT hemi thyroid. Recommend thyroid ultrasound (ref: J Am Coll Radiol. 2015 Feb;12(2): 143-50). These results were called by telephone at the time of interpretation on 07/18/2019 at 1:07 pm to provider Duffy Bruce , who verbally acknowledged these results. Electronically Signed   By: Zetta Bills M.D.   On: 07/18/2019 13:07   Result Date: 07/18/2019 CLINICAL DATA:  Chest pain shortness of breath. EXAM: CT CHEST WITH CONTRAST TECHNIQUE: Multidetector CT imaging of the chest was performed during intravenous contrast administration. CONTRAST:  45mL OMNIPAQUE IOHEXOL 300 MG/ML  SOLN COMPARISON:  07/07/2019 FINDINGS: Cardiovascular: Heart size is normal without pericardial effusion. Calcified and noncalcified plaque throughout the thoracic aorta is similar to the prior study. Central pulmonary vasculature is unremarkable on venous phase imaging. Mediastinum/Nodes: Mildly heterogeneous enlargement of the LEFT hemi thyroid. No thoracic inlet adenopathy. No axillary lymphadenopathy. No hilar lymphadenopathy. Lungs/Pleura: Basilar consolidation on the RIGHT with small amount of pleural fluid, diminished since 07/07/2019. Post removal of RIGHT-sided chest tube. Small partially loculated area posterior to consolidated lung in the RIGHT  chest measuring approximately 4.3 x 2.2 cm. Unchanged in this location when compared to the previous imaging study. Near complete resolution of pleural fluid elsewhere in the chest. Improved aeration overall since the study of 07/07/2019 in the RIGHT lower lobe. Area of consolidative change shows distortion of pulmonary vasculature and bronchovascular structures extending to the posterior RIGHT chest. Upper Abdomen: Incidental imaging of upper abdominal contents is unremarkable. Musculoskeletal:  Spinal degenerative changes without acute or destructive bone process. IMPRESSION: 1. Basilar consolidation on the RIGHT with small amount of pleural fluid, diminished since 07/07/2019. Post removal of RIGHT-sided chest tube. 2. Small partially loculated area posterior to consolidated lung in the RIGHT chest measuring approximately 4.3 x 2.2 cm, unchanged in this location when compared to the previous imaging study. Compatible with persistent small volume empyema a a 3. Improved aeration at the RIGHT lung base still with an area of consolidative change adjacent to small loculated collection in the posterior RIGHT chest. This may represent partially trapped lung or developing rounded atelectasis. Continued follow-up is suggested. 4. Aortic atherosclerosis. Aortic Atherosclerosis (ICD10-I70.0). Electronically Signed: By: Zetta Bills M.D. On: 07/18/2019 12:22   MR CERVICAL SPINE W WO CONTRAST  Result Date: 07/19/2019 CLINICAL DATA:  Left-sided neck pain EXAM: MRI CERVICAL SPINE WITHOUT AND WITH CONTRAST TECHNIQUE: Multiplanar and multiecho pulse sequences of the cervical spine, to include the craniocervical junction and cervicothoracic junction, were obtained without and with intravenous contrast. CONTRAST:  30mL GADAVIST GADOBUTROL 1 MMOL/ML IV SOLN COMPARISON:  None. FINDINGS: Alignment: Physiologic. Vertebrae: No fracture, evidence of discitis, or bone lesion. Cord: Normal signal and morphology. Posterior Fossa,  vertebral arteries, paraspinal tissues: Negative. Disc levels: C1-2: Unremarkable. C2-3: Normal disc space and facet joints. There is no spinal canal stenosis. No neural foraminal stenosis. C3-4: Small disc bulge with bilateral uncovertebral hypertrophy. There is no spinal canal stenosis. Moderate right and severe left neural foraminal stenosis. C4-5: Small disc bulge with right-greater-than-left facet hypertrophy and uncovertebral spurring. There is no spinal canal stenosis. Severe bilateral neural foraminal stenosis. C5-6: Disc space narrowing with intermediate disc osteophyte complex. Mild spinal canal stenosis. Mild right and severe left neural foraminal stenosis. C6-7: Small disc bulge. There is no spinal canal stenosis. No neural foraminal stenosis. C7-T1: Normal disc space and facet joints. There is no spinal canal stenosis. No neural foraminal stenosis. IMPRESSION: 1. Mild spinal canal stenosis and severe left neural foraminal stenosis at C5-6. 2. Severe bilateral C4-5 neural foraminal stenosis. 3. Moderate right and severe left C3-4 neural foraminal stenosis. Electronically Signed   By: Ulyses Jarred M.D.   On: 07/19/2019 01:48   DG Hand Complete Left  Result Date: 07/18/2019 CLINICAL DATA:  Left hand pain and swelling described over the index finger MCP joint. EXAM: LEFT HAND - COMPLETE 3+ VIEW COMPARISON:  None. FINDINGS: No fracture or bone lesion. Mild asymmetric joint space narrowing of several of the interphalangeal joints. There is dorsal marginal spurring at the PIP joints of the second, third and fourth fingers. MCP joints are relatively well maintained. No erosions. Soft tissue prominence is noted adjacent to the spurring at the PIP joints of the second, third and fourth fingers, and along the proximal aspect of the index finger. IMPRESSION: 1. No fracture or dislocation. 2. Arthropathic changes involving the interphalangeal joints most evident at the PIP joints of the second, third and fourth  fingers. Soft tissue swelling is most prominent at the base of the index finger. No joint erosions. Electronically Signed   By: Lajean Manes M.D.   On: 07/18/2019 10:26   DG Lumbar Puncture Fluoro Guide  Result Date: 07/19/2019 CLINICAL DATA:  Neck pain, possible meningitis EXAM: DIAGNOSTIC LUMBAR PUNCTURE UNDER FLUOROSCOPIC GUIDANCE FLUOROSCOPY TIME:  Fluoroscopy Time:  4 minutes 42 seconds Radiation Exposure Index (if provided by the fluoroscopic device): 150 mGy Number of Acquired Spot Images: 2 PROCEDURE: Informed consent was obtained from the patient prior to the procedure,  including potential complications of headache, allergy, and pain. With the patient prone, the lower back was prepped with Betadine. 1% Lidocaine was used for local anesthesia. Lumbar puncture was performed at the L3 level using a 20 gauge needle with return of clear CSF with an opening pressure of 15 cm water. Sixteen ml of CSF were obtained for laboratory studies. The patient tolerated the procedure well and there were no apparent complications. IMPRESSION: Successful fluoro guided lumbar puncture for fluid. Electronically Signed   By: Inez Catalina M.D.   On: 07/19/2019 12:34     Assessment/Plan: Greyden Cumbo is a 74 y.o. male with medical history significant of hypertension, hyperlipidemia, OSA, BPH, left bundle blockage, recent admission due to right empyema, who presents with fever, joint pain and neck pain.  Concern was for meningitis.  LP performed.  CSF shows wbc count of 10, rbc count of 3 with normal protein at 28 and glucose of 82.  Although white count slightly elevated with normal protein and glucose, do not suspect meningitis. MRI of the cervical spine personally reviewed and shows multiple levels of moderate to severe foraminal stenosis.  Neurosurgery has evaluated the patient and will follow up outpatient.  Neck pain may very well be a manifestation of her other joint pain.    Recommendations: 1. No  indication for treatment of meningitis at this time 2. Agree with ID involvement   Alexis Goodell, MD Neurology [phone] 07/19/2019, 9:26 PM

## 2019-07-19 NOTE — Procedures (Signed)
Lumbar puncture without difficulty at L3  Clear CSF obtained   Opening pressure of 15  Complications:  None  Blood Loss: none  See dictation in canopy pacs

## 2019-07-19 NOTE — ED Notes (Signed)
Pt transported to MRI 

## 2019-07-19 NOTE — Progress Notes (Signed)
PROGRESS NOTE    [name]  [name] DOB: Mar 28, 1945 DOA: 07/18/2019 PCP: Derinda Late, MD   Brief Narrative:   Andrew Myers is a 74 y.o. male with medical history significant of hypertension, hyperlipidemia, OSA, BPH, left bundle blockage, recent admission due to right empyema, who presents with fever, joint pain and neck pain.  Patient was recently hospitalized from 5/12-5/21 due to right empyema.  Patient is s/p for thoracentesis and had positive culture for gram-positive bacteria.  Patient is discharged on Augmentin.  Patient states that he developed fever 102.4 yesterday afternoon. He has joint pain in left index finger and right wrist.  The left index finger is red and swelling. He also has left-sided neck pain which is worse on movement from side to side. Patient denies chest pain, shortness breath, cough. CT chest with a small loculated collection in the posterior right chest. MRI neck with evidence of mild spinal canal stenosis and severe left neural foraminal stenosis at C5-6, severe bilateral C4-5 neural foraminal stenosis, and moderate right and severe left C3-4 neural foraminal stenosis. Had LP done today to rule out meningitis.  Pulmonary, neurosurgery and ID is on board.  Subjective: Patient continued to complain of joint pains.  Denies any prior history of arthritis.  On further questioning he has a chronic neck pain, denies any tingling or numbness.  Denies any focal weakness.  Denies any incontinence of urine or feces.  Assessment & Plan:   Principal Problem:   Empyema of right pleural space (HCC) Active Problems:   Hypertension   Hyperlipidemia   BPH (benign prostatic hyperplasia)   Hypokalemia   Neck pain   Empyema (HCC)   Joint pain  Empyema of right pleural space [facility]): s/p of thoracentesis recently.  Still has fever. Dr. Genevive Bi of CT surgery is consulted --> planing to do right thoracotomy and decortication of lung.  Pulmonary also  got involved and they of surgery and suggested to do an LP to rule out meningitis.  ID was also consulted by pulmonary.   -LP was done-pending results. -Antibiotics were switched with cefepime and vancomycin by pulmonary. -ID was recommending to involve rheumatology for the concern of serum sickness.  Generalized joint pain/neck pain.  MRI Neck with some foraminal stenosis, negative for any infection.  Neurosurgery was consulted by pulmonary and they do not think that he needs any surgical intervention at this time.  No cervical myelopathy. -ID wants to consult rheumatology for the concern of serum sickness. -Page Dr. Kathaleen Grinder response.  HTN:  -Continue home medications: Amlodipine, Bystolic, irbesartan -hydralazine prn -Hold HCTZ while patient is on n.p.o.  Hyperlipidemia -lipitor  BPH (benign prostatic hyperplasia): -flomax  Hypokalemia: K=3.3 on admission. - Repleted - Check Mg level  Objective: Vitals:   07/19/19 0537 07/19/19 0612 07/19/19 1000 07/19/19 1338  BP: (!) 143/78 (!) 145/68 129/69 (!) 154/71  Pulse: 93 93 85 86  Resp: 16 16 18 15   Temp:    98.6 F (37 C)  TempSrc:    Oral  SpO2: 94% 96% 94% 97%  Weight:      Height:        Intake/Output Summary (Last 24 hours) at 07/19/2019 1641 Last data filed at 07/19/2019 1500 Gross per 24 hour  Intake 148.18 ml  Output 275 ml  Net -126.82 ml   Filed Weights   07/18/19 0916  Weight: 96.2 kg    Examination:  General exam: Appears calm and comfortable  Respiratory system: Clear to auscultation,  breath sounds at right base, respiratory effort normal. Cardiovascular system: S1 & S2 heard, RRR. No JVD, murmurs, rubs, gallops or clicks. Gastrointestinal system: Soft, nontender, nondistended, bowel sounds positive. Central nervous system: Alert and oriented. No focal neurological deficits.Symmetric 5 x 5 power. Extremities: No edema, no cyanosis, pulses intact and symmetrical. Psychiatry: Judgement  and insight appear normal.    DVT prophylaxis: SCDs Code Status: Full Family Communication: Discussed with patient Disposition Plan:  Status is: Inpatient  Remains inpatient appropriate because:Inpatient level of care appropriate due to severity of illness   Dispo: The patient is from: Home              Anticipated d/c is to: Home              Anticipated d/c date is: 2 days              Patient currently is not medically stable to d/c.  Consultants:  Thoracic surgery Pulmonology Neurosurgery ID Rheumatology  Procedures:  Antimicrobials:  Cefepime Vancomycin  Data Reviewed: I have personally reviewed following labs and imaging studies  CBC: Recent Labs  Lab 07/14/19 0727 07/18/19 1018 07/19/19 0447 07/19/19 1403  WBC 9.9 9.4 14.9* 13.9*  NEUTROABS 7.2 7.5  --  12.3*  HGB 11.8* 12.4* 12.1* 12.4*  HCT 34.8* 37.1* 36.8* 36.8*  MCV 82.7 83.2 84.0 83.1  PLT 224 279 261 0000000   Basic Metabolic Panel: Recent Labs  Lab 07/14/19 0727 07/18/19 1018 07/19/19 0447 07/19/19 1403  NA 136 134* 130* 131*  K 3.2* 3.3* 3.3* 3.5  CL 101 98 96* 96*  CO2 25 24 21* 24  GLUCOSE 137* 152* 161* 161*  BUN 10 13 10 10   CREATININE 0.41* 0.62 0.69 0.60*  CALCIUM 8.5* 9.2 8.7* 8.6*  MG  --   --  1.5*  --    GFR: Estimated Creatinine Clearance: 98.8 mL/min (A) (by C-G formula based on SCr of 0.6 mg/dL (L)). Liver Function Tests: Recent Labs  Lab 07/18/19 1018 07/19/19 1403  AST 28 18  ALT 33 23  ALKPHOS 51 47  BILITOT 1.0 1.2  PROT 8.1 7.6  ALBUMIN 3.3* 3.0*   No results for input(s): LIPASE, AMYLASE in the last 168 hours. No results for input(s): AMMONIA in the last 168 hours. Coagulation Profile: Recent Labs  Lab 07/19/19 0447  INR 1.2   Cardiac Enzymes: Recent Labs  Lab 07/19/19 1535  CKTOTAL 38*   BNP (last 3 results) No results for input(s): PROBNP in the last 8760 hours. HbA1C: No results for input(s): HGBA1C in the last 72 hours. CBG: No results  for input(s): GLUCAP in the last 168 hours. Lipid Profile: No results for input(s): CHOL, HDL, LDLCALC, TRIG, CHOLHDL, LDLDIRECT in the last 72 hours. Thyroid Function Tests: No results for input(s): TSH, T4TOTAL, FREET4, T3FREE, THYROIDAB in the last 72 hours. Anemia Panel: No results for input(s): VITAMINB12, FOLATE, FERRITIN, TIBC, IRON, RETICCTPCT in the last 72 hours. Sepsis Labs: Recent Labs  Lab 07/18/19 1018 07/19/19 1403  PROCALCITON <0.10 0.24    Recent Results (from the past 240 hour(s))  Blood culture (routine x 2)     Status: None (Preliminary result)   Collection Time: 07/18/19 10:18 AM   Specimen: BLOOD  Result Value Ref Range Status   Specimen Description BLOOD LEFT [facility]  Final   Special Requests   Final    BOTTLES DRAWN AEROBIC AND ANAEROBIC Blood Culture adequate volume   Culture   Final  NO GROWTH < 24 HOURS Performed at [facility], [address] [place], [place] [address]    Report Status PENDING  Incomplete  Blood culture (routine x 2)     Status: None (Preliminary result)   Collection Time: 07/18/19 10:19 AM   Specimen: BLOOD  Result Value Ref Range Status   Specimen Description BLOOD LEFT HAND  Final   Special Requests   Final    BOTTLES DRAWN AEROBIC AND ANAEROBIC Blood Culture adequate volume   Culture   Final    NO GROWTH < 24 HOURS Performed at [facility], [address], [place], [place] [address]    Report Status PENDING  Incomplete  SARS Coronavirus 2 by RT PCR (hospital order, performed in [place] hospital lab) Nasopharyngeal Nasopharyngeal Swab     Status: None   Collection Time: 07/18/19 12:52 PM   Specimen: Nasopharyngeal Swab  Result Value Ref Range Status   SARS Coronavirus 2 NEGATIVE NEGATIVE Final    Comment: (NOTE) SARS-CoV-2 target nucleic acids are NOT DETECTED. The SARS-CoV-2 RNA is generally detectable in upper and lower respiratory specimens during the acute phase of infection. The  lowest concentration of SARS-CoV-2 viral copies this assay can detect is 250 copies / mL. A negative result does not preclude SARS-CoV-2 infection and should not be used as the sole basis for treatment or other patient management decisions.  A negative result may occur with improper specimen collection / handling, submission of specimen other than nasopharyngeal swab, presence of viral mutation(s) within the areas targeted by this assay, and inadequate number of viral copies (<250 copies / mL). A negative result must be combined with clinical observations, patient history, and epidemiological information. Fact Sheet for Patients:   StrictlyIdeas.no Fact Sheet for Healthcare Providers: BankingDealers.co.za This test is not yet approved or cleared  by the [place] FDA and has been authorized for detection and/or diagnosis of SARS-CoV-2 by FDA under an Emergency Use Authorization (EUA).  This EUA will remain in effect (meaning this test can be used) for the duration of the COVID-19 declaration under Section 564(b)(1) of the Act, 21 U.S.C. section 360bbb-3(b)(1), unless the authorization is terminated or revoked sooner. Performed at [facility], [address] [place], [place] [address]   CSF culture     Status: None (Preliminary result)   Collection Time: 07/19/19  9:16 AM   Specimen: Cerebrospinal Fluid  Result Value Ref Range Status   Specimen Description CSF  Final   Special Requests NONE  Final   Gram Stain   Final    WBC SEEN RED BLOOD CELLS NO ORGANISMS SEEN Performed at [facility], [address] [place], [place] [address]    Culture PENDING  Incomplete   Report Status PENDING  Incomplete     Radiology Studies: DG Chest 2 View  Result Date: 07/18/2019 CLINICAL DATA:  Fever.  Chest tube removal EXAM: CHEST - 2 VIEW COMPARISON:  Jul 13, 2019 FINDINGS: Chest tube has been removed. No appreciable  pneumothorax. There is a small right pleural effusion with ill-defined airspace opacity in the right mid and lower lung zones. Left lung clear. Heart size and pulmonary vascularity are normal. No adenopathy. There is mild degenerative change in the thoracic spine. IMPRESSION: No evident pneumothorax. Small right pleural effusion with ill-defined opacity in the right mid and lower lung zones, likely combination of loculated pleural effusion and atelectasis. Mild pneumonia cannot be excluded in this area. Lungs elsewhere clear. Cardiac silhouette within  normal limits. Electronically Signed   By: Lowella Grip III M.D.   On: 07/18/2019 10:57   CT Chest W Contrast  Addendum Date: 07/18/2019   ADDENDUM REPORT: 07/18/2019 13:07 ADDENDUM: Enlarged and mildly heterogeneous LEFT hemi thyroid. Recommend thyroid ultrasound (ref: J Am Coll Radiol. 2015 Feb;12(2): 143-50). These results were called by telephone at the time of interpretation on 07/18/2019 at 1:07 pm to provider Duffy Bruce , who verbally acknowledged these results. Electronically Signed   By: Zetta Bills M.D.   On: 07/18/2019 13:07   Result Date: 07/18/2019 CLINICAL DATA:  Chest pain shortness of breath. EXAM: CT CHEST WITH CONTRAST TECHNIQUE: Multidetector CT imaging of the chest was performed during intravenous contrast administration. CONTRAST:  41mL OMNIPAQUE IOHEXOL 300 MG/ML  SOLN COMPARISON:  07/07/2019 FINDINGS: Cardiovascular: Heart size is normal without pericardial effusion. Calcified and noncalcified plaque throughout the thoracic aorta is similar to the prior study. Central pulmonary vasculature is unremarkable on venous phase imaging. Mediastinum/Nodes: Mildly heterogeneous enlargement of the LEFT hemi thyroid. No thoracic inlet adenopathy. No axillary lymphadenopathy. No hilar lymphadenopathy. Lungs/Pleura: Basilar consolidation on the RIGHT with small amount of pleural fluid, diminished since 07/07/2019. Post removal of RIGHT-sided  chest tube. Small partially loculated area posterior to consolidated lung in the RIGHT chest measuring approximately 4.3 x 2.2 cm. Unchanged in this location when compared to the previous imaging study. Near complete resolution of pleural fluid elsewhere in the chest. Improved aeration overall since the study of 07/07/2019 in the RIGHT lower lobe. Area of consolidative change shows distortion of pulmonary vasculature and bronchovascular structures extending to the posterior RIGHT chest. Upper Abdomen: Incidental imaging of upper abdominal contents is unremarkable. Musculoskeletal: Spinal degenerative changes without acute or destructive bone process. IMPRESSION: 1. Basilar consolidation on the RIGHT with small amount of pleural fluid, diminished since 07/07/2019. Post removal of RIGHT-sided chest tube. 2. Small partially loculated area posterior to consolidated lung in the RIGHT chest measuring approximately 4.3 x 2.2 cm, unchanged in this location when compared to the previous imaging study. Compatible with persistent small volume empyema a a 3. Improved aeration at the RIGHT lung base still with an area of consolidative change adjacent to small loculated collection in the posterior RIGHT chest. This may represent partially trapped lung or developing rounded atelectasis. Continued follow-up is suggested. 4. Aortic atherosclerosis. Aortic Atherosclerosis (ICD10-I70.0). Electronically Signed: By: Zetta Bills M.D. On: 07/18/2019 12:22   MR CERVICAL SPINE W WO CONTRAST  Result Date: 07/19/2019 CLINICAL DATA:  Left-sided neck pain EXAM: MRI CERVICAL SPINE WITHOUT AND WITH CONTRAST TECHNIQUE: Multiplanar and multiecho pulse sequences of the cervical spine, to include the craniocervical junction and cervicothoracic junction, were obtained without and with intravenous contrast. CONTRAST:  19mL GADAVIST GADOBUTROL 1 MMOL/ML IV SOLN COMPARISON:  None. FINDINGS: Alignment: Physiologic. Vertebrae: No fracture, evidence  of discitis, or bone lesion. Cord: Normal signal and morphology. Posterior Fossa, vertebral arteries, paraspinal tissues: Negative. Disc levels: C1-2: Unremarkable. C2-3: Normal disc space and facet joints. There is no spinal canal stenosis. No neural foraminal stenosis. C3-4: Small disc bulge with bilateral uncovertebral hypertrophy. There is no spinal canal stenosis. Moderate right and severe left neural foraminal stenosis. C4-5: Small disc bulge with right-greater-than-left facet hypertrophy and uncovertebral spurring. There is no spinal canal stenosis. Severe bilateral neural foraminal stenosis. C5-6: Disc space narrowing with intermediate disc osteophyte complex. Mild spinal canal stenosis. Mild right and severe left neural foraminal stenosis. C6-7: Small disc bulge. There is no spinal canal stenosis. No neural foraminal  stenosis. C7-T1: Normal disc space and facet joints. There is no spinal canal stenosis. No neural foraminal stenosis. IMPRESSION: 1. Mild spinal canal stenosis and severe left neural foraminal stenosis at C5-6. 2. Severe bilateral C4-5 neural foraminal stenosis. 3. Moderate right and severe left C3-4 neural foraminal stenosis. Electronically Signed   By: Ulyses Jarred M.D.   On: 07/19/2019 01:48   DG Hand Complete Left  Result Date: 07/18/2019 CLINICAL DATA:  Left hand pain and swelling described over the index finger MCP joint. EXAM: LEFT HAND - COMPLETE 3+ VIEW COMPARISON:  None. FINDINGS: No fracture or bone lesion. Mild asymmetric joint space narrowing of several of the interphalangeal joints. There is dorsal marginal spurring at the PIP joints of the second, third and fourth fingers. MCP joints are relatively well maintained. No erosions. Soft tissue prominence is noted adjacent to the spurring at the PIP joints of the second, third and fourth fingers, and along the proximal aspect of the index finger. IMPRESSION: 1. No fracture or dislocation. 2. Arthropathic changes involving the  interphalangeal joints most evident at the PIP joints of the second, third and fourth fingers. Soft tissue swelling is most prominent at the base of the index finger. No joint erosions. Electronically Signed   By: Lajean Manes M.D.   On: 07/18/2019 10:26   DG Lumbar Puncture Fluoro Guide  Result Date: 07/19/2019 CLINICAL DATA:  Neck pain, possible meningitis EXAM: DIAGNOSTIC LUMBAR PUNCTURE UNDER FLUOROSCOPIC GUIDANCE FLUOROSCOPY TIME:  Fluoroscopy Time:  4 minutes 42 seconds Radiation Exposure Index (if provided by the fluoroscopic device): 150 mGy Number of Acquired Spot Images: 2 PROCEDURE: Informed consent was obtained from the patient prior to the procedure, including potential complications of headache, allergy, and pain. With the patient prone, the lower back was prepped with Betadine. 1% Lidocaine was used for local anesthesia. Lumbar puncture was performed at the L3 level using a 20 gauge needle with return of clear CSF with an opening pressure of 15 cm water. Sixteen ml of CSF were obtained for laboratory studies. The patient tolerated the procedure well and there were no apparent complications. IMPRESSION: Successful fluoro guided lumbar puncture for fluid. Electronically Signed   By: Inez Catalina M.D.   On: 07/19/2019 12:34    Scheduled Meds: . amLODipine  5 mg Oral Daily  . aspirin EC  81 mg Oral BID  . atorvastatin  20 mg Oral QHS  . irbesartan  150 mg Oral Daily  . loratadine  10 mg Oral Daily  . methylPREDNISolone (SOLU-MEDROL) injection  40 mg Intravenous Daily  . multivitamin with minerals  1 tablet Oral Daily  . nebivolol  5 mg Oral Daily  . tamsulosin  0.8 mg Oral QPC supper   Continuous Infusions: . ceFEPime (MAXIPIME) IV Stopped (07/19/19 1313)  . vancomycin       LOS: 1 day   Time spent: 45 minutes.  Lorella Nimrod, MD [facility]  If 7PM-7AM, please contact night-coverage [email or web address]  07/19/2019, 4:41 PM   This record has been created using Actor. Errors have been sought and corrected,but may not always be located. Such creation errors do not reflect on the standard of care.

## 2019-07-19 NOTE — ED Notes (Signed)
Pt refuses to sign consent form for scheduled bronchoscopy with right thoracotomy and decortication of lung. OR has been notified.

## 2019-07-19 NOTE — Progress Notes (Signed)
Pulmonary Medicine          Date: 07/19/2019,   MRN# [id number] Andrew Myers [birth year]     AdmissionWeight: 96.2 kg                 CurrentWeight: 96.2 kg   Refering physician:Dr Agbata   CHIEF COMPLAINT:   Empyema s/p intrapleural drain with recurrence of symptoms   SUBJECTIVE   -Patient is laying in bed in mild distress due to neck and joint pain.  He was discharged last week after full course of IV abx for community aquired pneumonia. While inpatient he had 2 pigtail intralpleural catheters with tPA instillation for empyema.  This was frank pus grossly and did have positive gram stain for cocci.  In ED he had CT chest repeated with significant improvement compared to previous and.   -patient is using IS well.   Plan for outpatient augmentin - discussed side effects and to let us known if diarreah , rash  -Patient will be followed up on oupatient post d/c - reviewed symptoms of recurrence of empyema including sob/dyspena, CP, desaturation of oxygen, fevers  -continue to ambulate while hospitalized  PAST MEDICAL HISTORY   Past Medical History:  Diagnosis Date  . BPH (benign prostatic hyperplasia)   . CAP (community acquired pneumonia) 04/2019  . Empyema lung ([place]) 06/2019  . Hyperlipidemia   . Hypertension   . Obesity   . Pleural effusion on right 05/2019  . Skin cancer    BCC, SCC  . Sleep apnea   . Sleep apnea      SURGICAL HISTORY   Past Surgical History:  Procedure Laterality Date  . APPENDECTOMY    . CATARACT EXTRACTION    . FRACTURE SURGERY       FAMILY HISTORY   Family History  Problem Relation Age of Onset  . Breast cancer Mother   . Hypertension Mother   . Hyperlipidemia Mother   . Heart failure Mother   . Heart attack Father   . Thyroid cancer Sister      SOCIAL HISTORY   Social History   Tobacco Use  . Smoking status: Former Smoker    Quit date: 05/07/1967    Years since quitting: 52.2  . Smokeless  tobacco: Never Used  Substance Use Topics  . Alcohol use: Yes    Comment: rarely  . Drug use: No     MEDICATIONS    Home Medication:    Current Medication:  Current Facility-Administered Medications:  .  acetaminophen (TYLENOL) tablet 650 mg, 650 mg, Oral, Q6H PRN, Ivor Costa, MD, 650 mg at 07/18/19 2036 .  amLODipine (NORVASC) tablet 5 mg, 5 mg, Oral, Daily, Ivor Costa, MD .  aspirin EC tablet 81 mg, 81 mg, Oral, BID, Ivor Costa, MD, 81 mg at 07/18/19 2135 .  atorvastatin (LIPITOR) tablet 20 mg, 20 mg, Oral, QHS, Ivor Costa, MD, 20 mg at 07/18/19 2135 .  ceFEPIme (MAXIPIME) 2 g in sodium chloride 0.9 % 100 mL IVPB, 2 g, Intravenous, Q8H, Nazari, Walid A, RPH .  diphenhydrAMINE (BENADRYL) capsule 25 mg, 25 mg, Oral, TID PRN **OR** diphenhydrAMINE (BENADRYL) injection 25 mg, 25 mg, Intravenous, TID PRN, Lorella Nimrod, MD .  hydrALAZINE (APRESOLINE) injection 5 mg, 5 mg, Intravenous, Q2H PRN, Ivor Costa, MD .  hydrOXYzine (VISTARIL) injection 25 mg, 25 mg, Intramuscular, Q6H PRN, Ivor Costa, MD .  irbesartan (AVAPRO) tablet 150 mg, 150 mg, Oral, Daily, Ivor Costa, MD .  loratadine (  CLARITIN) tablet 10 mg, 10 mg, Oral, Daily, Ivor Costa, MD, 10 mg at 07/18/19 2136 .  methylPREDNISolone sodium succinate (SOLU-MEDROL) 40 mg/mL injection 40 mg, 40 mg, Intravenous, Daily, Siara Gorder, MD .  multivitamin with minerals tablet 1 tablet, 1 tablet, Oral, Daily, Ivor Costa, MD .  nebivolol (BYSTOLIC) tablet 5 mg, 5 mg, Oral, Daily, Ivor Costa, MD .  oxyCODONE-acetaminophen (PERCOCET/ROXICET) 5-325 MG per tablet 1 tablet, 1 tablet, Oral, Q4H PRN, Ivor Costa, MD, 1 tablet at 07/19/19 0845 .  tamsulosin (FLOMAX) capsule 0.8 mg, 0.8 mg, Oral, QPC supper, Ivor Costa, MD .  vancomycin (VANCOREADY) IVPB 1250 mg/250 mL, 1,250 mg, Intravenous, Q12H, Lorella Nimrod, MD  Current Outpatient Medications:  .  amLODipine (NORVASC) 5 MG tablet, Take 5 mg by mouth daily., Disp: , Rfl:  .   amoxicillin-clavulanate (AUGMENTIN) 875-125 MG tablet, Take 1 tablet by mouth 2 (two) times daily for 14 days., Disp: 28 tablet, Rfl: 0 .  aspirin EC 81 MG tablet, Take 81 mg by mouth 2 (two) times daily. , Disp: , Rfl:  .  atorvastatin (LIPITOR) 20 MG tablet, Take 20 mg by mouth at bedtime. , Disp: , Rfl:  .  cetirizine (ZYRTEC) 10 MG tablet, Take 10 mg by mouth daily., Disp: , Rfl:  .  hydrochlorothiazide (HYDRODIURIL) 25 MG tablet, Take 25 mg by mouth daily., Disp: , Rfl:  .  Multiple Vitamin (MULTI-VITAMIN) tablet, Take 1 tablet by mouth daily., Disp: , Rfl:  .  nebivolol (BYSTOLIC) 5 MG tablet, Take 5 mg by mouth daily., Disp: , Rfl:  .  tamsulosin (FLOMAX) 0.4 MG CAPS capsule, Take 0.8 mg by mouth daily after supper. , Disp: , Rfl:  .  valsartan (DIOVAN) 160 MG tablet, Take 160 mg by mouth daily. , Disp: , Rfl:  .  sildenafil (REVATIO) 20 MG tablet, Take 20-100 mg by mouth daily as needed (ED). , Disp: , Rfl:     ALLERGIES   Codeine     REVIEW OF SYSTEMS    Review of Systems:  Gen:  Denies  fever, sweats, chills weigh loss  HEENT: Denies blurred vision, double vision, ear pain, eye pain, hearing loss, nose bleeds, sore throat+neck pain  Cardiac:  No dizziness, chest pain or heaviness, chest tightness,edema Resp:   Denies cough or sputum porduction, shortness of breath,wheezing, hemoptysis,  Gi: Denies swallowing difficulty, stomach pain, nausea or vomiting, diarrhea, constipation, bowel incontinence Gu:  Denies bladder incontinence, burning urine Ext:   Admits to joint pain upper extermiteis including elbows, shoulders and MCP Skin: +erythema over joints Endoc:  Denies polyuria, polydipsia , polyphagia or weight change Psych:   Denies depression, insomnia or hallucinations   Other:  All other systems negative   VS: BP (!) 145/68 (BP Location: Left Arm)   Pulse 93   Temp 98.3 F (36.8 C) (Oral)   Resp 16   Ht 5\' 11"  (1.803 m)   Wt 96.2 kg   SpO2 96%   BMI 29.57  kg/m      PHYSICAL EXAM    GENERAL:NAD, no fevers, chills, no weakness no fatigue HEAD: Normocephalic, atraumatic.  EYES: Pupils equal, round, reactive to light. Extraocular muscles intact. No scleral icterus.  MOUTH: Moist mucosal membrane. Dentition intact. No abscess noted.  EAR, NOSE, THROAT: Clear without exudates. No external lesions.  NECK: Supple. No thyromegaly. No nodules. No JVD.  PULMONARY: Clear to ausculatation bilaterally with Decreased air entry on right CARDIOVASCULAR: S1 and S2. Regular rate and rhythm. No murmurs,  rubs, or gallops. No edema. Pedal pulses 2+ bilaterally.  GASTROINTESTINAL: Soft, nontender, nondistended. No masses. Positive bowel sounds. No hepatosplenomegaly.  MUSCULOSKELETAL: No swelling, clubbing, or edema. Range of motion full in all extremities.  NEUROLOGIC: Cranial nerves II through XII are intact. No gross focal neurological deficits. Sensation intact. Reflexes intact.  SKIN: No ulceration, lesions, rashes, or cyanosis. Skin warm and dry. Turgor intact.  PSYCHIATRIC: Mood, affect within normal limits. The patient is awake, alert and oriented x 3. Insight, judgment intact.       IMAGING    DG Chest 1 View  Result Date: 07/04/2019 CLINICAL DATA:  Status post right thoracentesis. EXAM: CHEST  1 VIEW COMPARISON:  CT of the chest on 06/29/2019 FINDINGS: The heart size and mediastinal contours are within normal limits. Residual lateral/posterolateral component of loculated pleural fluid remains on the right. No pneumothorax after right thoracentesis. No pulmonary edema. The visualized skeletal structures are unremarkable. IMPRESSION: No pneumothorax after right sided thoracentesis. Residual lateral/posterolateral component of loculated right pleural fluid remains. Electronically Signed   By: Aletta Edouard M.D.   On: 07/04/2019 14:26   DG Chest 2 View  Result Date: 07/18/2019 CLINICAL DATA:  Fever.  Chest tube removal EXAM: CHEST - 2 VIEW  COMPARISON:  Jul 13, 2019 FINDINGS: Chest tube has been removed. No appreciable pneumothorax. There is a small right pleural effusion with ill-defined airspace opacity in the right mid and lower lung zones. Left lung clear. Heart size and pulmonary vascularity are normal. No adenopathy. There is mild degenerative change in the thoracic spine. IMPRESSION: No evident pneumothorax. Small right pleural effusion with ill-defined opacity in the right mid and lower lung zones, likely combination of loculated pleural effusion and atelectasis. Mild pneumonia cannot be excluded in this area. Lungs elsewhere clear. Cardiac silhouette within normal limits. Electronically Signed   By: Lowella Grip III M.D.   On: 07/18/2019 10:57   DG Chest 2 View  Result Date: 07/12/2019 CLINICAL DATA:  Empyema.  Chest tube present. EXAM: CHEST - 2 VIEW COMPARISON:  07/11/2019 FINDINGS: Right basilar chest tube unchanged. Lungs are somewhat hypoinflated with evidence of a small amount right pleural fluid without significant change. No pneumothorax. Remainder of the lungs are clear. Cardiomediastinal silhouette and remainder of the exam is unchanged. IMPRESSION: Small amount right pleural fluid with right basilar chest tube unchanged. Electronically Signed   By: Marin Olp M.D.   On: 07/12/2019 08:25   CT CHEST WO CONTRAST  Result Date: 07/07/2019 CLINICAL DATA:  Follow-up scan for empyema of the RIGHT lung, chest tube placed 2 days ago EXAM: CT CHEST WITHOUT CONTRAST TECHNIQUE: Multidetector CT imaging of the chest was performed following the standard protocol without IV contrast. COMPARISON:  06/28/2009 and intervening interventional and ultrasound evaluations. FINDINGS: Cardiovascular: Calcified atheromatous plaque in the thoracic aorta similar to prior study. No aneurysmal dilation. Heart size is stable without pericardial effusion. Calcified coronary artery disease as before. Vessels not well assessed given lack of  intravenous contrast. Mediastinum/Nodes: No thoracic inlet adenopathy. No axillary lymphadenopathy. No mediastinal adenopathy. Esophagus grossly normal. Lungs/Pleura: Since the previous examination there is been interval placement of a RIGHT-sided chest tube. A small amount of subcutaneous emphysema is noted. The marker for chest tube sideholes is at least 1.5 cm within the RIGHT chest cavity within the pleural space. Pleural fluid at the RIGHT lung base is perhaps slightly diminished but there is no change in the loculated fluid along the fissure in the RIGHT chest when compared to  the prior exam RIGHT basilar consolidative changes and volume loss are similar to the prior study. LEFT chest is clear. Airways are patent. Upper Abdomen: Upper abdominal contents are unremarkable. No acute process in the upper abdomen. Musculoskeletal: Small amount of subcutaneous emphysema along the RIGHT chest wall as discussed. No chest wall mass. No acute bone process. Spinal degenerative change. IMPRESSION: 1. Slightly diminished RIGHT pleural fluid, dominant loculated area along the major fissure in the RIGHT chest is unchanged. 2. Persistent basilar consolidation/volume loss without change. This may represent rounded atelectasis or pneumonia. Follow-up is suggested to ensure there is no underlying mass. 3. Small amount of subcutaneous emphysema along the chest wall. Marker for sideholes is within the pleural space as discussed above. 4. Atherosclerotic changes in the thoracic aorta. Calcified coronary artery disease. 5. Aortic atherosclerosis. Aortic Atherosclerosis (ICD10-I70.0). Electronically Signed   By: Zetta Bills M.D.   On: 07/07/2019 16:33   CT CHEST WO CONTRAST  Result Date: 06/29/2019 CLINICAL DATA:  Pleural effusion, shortness of breath. Follow-up chest x-ray EXAM: CT CHEST WITHOUT CONTRAST TECHNIQUE: Multidetector CT imaging of the chest was performed following the standard protocol without IV contrast.  COMPARISON:  Chest x-ray 05/11/2019 FINDINGS: Cardiovascular: Heart is normal size. Aorta normal caliber. Scattered coronary artery and aortic calcifications. Mediastinum/Nodes: No mediastinal, hilar, or axillary adenopathy. Trachea and esophagus are unremarkable. Thyroid unremarkable. Lungs/Pleura: Partially loculated moderate-sized right pleural effusion noted. Compressive atelectasis or pneumonia in the right lower lobe. Scattered granulomas in the right lung. Left lung clear. Upper Abdomen: Imaging into the upper abdomen shows no acute findings. Musculoskeletal: Chest wall soft tissues are unremarkable. No acute bony abnormality. IMPRESSION: Moderate partially loculated right pleural effusion. Right lower lobe compressive atelectasis versus pneumonia. Electronically Signed   By: Rolm Baptise M.D.   On: 06/29/2019 14:56   CT Chest W Contrast  Addendum Date: 07/18/2019   ADDENDUM REPORT: 07/18/2019 13:07 ADDENDUM: Enlarged and mildly heterogeneous LEFT hemi thyroid. Recommend thyroid ultrasound (ref: J Am Coll Radiol. 2015 Feb;12(2): 143-50). These results were called by telephone at the time of interpretation on 07/18/2019 at 1:07 pm to provider Duffy Bruce , who verbally acknowledged these results. Electronically Signed   By: Zetta Bills M.D.   On: 07/18/2019 13:07   Result Date: 07/18/2019 CLINICAL DATA:  Chest pain shortness of breath. EXAM: CT CHEST WITH CONTRAST TECHNIQUE: Multidetector CT imaging of the chest was performed during intravenous contrast administration. CONTRAST:  86mL OMNIPAQUE IOHEXOL 300 MG/ML  SOLN COMPARISON:  07/07/2019 FINDINGS: Cardiovascular: Heart size is normal without pericardial effusion. Calcified and noncalcified plaque throughout the thoracic aorta is similar to the prior study. Central pulmonary vasculature is unremarkable on venous phase imaging. Mediastinum/Nodes: Mildly heterogeneous enlargement of the LEFT hemi thyroid. No thoracic inlet adenopathy. No axillary  lymphadenopathy. No hilar lymphadenopathy. Lungs/Pleura: Basilar consolidation on the RIGHT with small amount of pleural fluid, diminished since 07/07/2019. Post removal of RIGHT-sided chest tube. Small partially loculated area posterior to consolidated lung in the RIGHT chest measuring approximately 4.3 x 2.2 cm. Unchanged in this location when compared to the previous imaging study. Near complete resolution of pleural fluid elsewhere in the chest. Improved aeration overall since the study of 07/07/2019 in the RIGHT lower lobe. Area of consolidative change shows distortion of pulmonary vasculature and bronchovascular structures extending to the posterior RIGHT chest. Upper Abdomen: Incidental imaging of upper abdominal contents is unremarkable. Musculoskeletal: Spinal degenerative changes without acute or destructive bone process. IMPRESSION: 1. Basilar consolidation on the RIGHT with small  amount of pleural fluid, diminished since 07/07/2019. Post removal of RIGHT-sided chest tube. 2. Small partially loculated area posterior to consolidated lung in the RIGHT chest measuring approximately 4.3 x 2.2 cm, unchanged in this location when compared to the previous imaging study. Compatible with persistent small volume empyema a a 3. Improved aeration at the RIGHT lung base still with an area of consolidative change adjacent to small loculated collection in the posterior RIGHT chest. This may represent partially trapped lung or developing rounded atelectasis. Continued follow-up is suggested. 4. Aortic atherosclerosis. Aortic Atherosclerosis (ICD10-I70.0). Electronically Signed: By: Zetta Bills M.D. On: 07/18/2019 12:22   MR CERVICAL SPINE W WO CONTRAST  Result Date: 07/19/2019 CLINICAL DATA:  Left-sided neck pain EXAM: MRI CERVICAL SPINE WITHOUT AND WITH CONTRAST TECHNIQUE: Multiplanar and multiecho pulse sequences of the cervical spine, to include the craniocervical junction and cervicothoracic junction, were  obtained without and with intravenous contrast. CONTRAST:  62mL GADAVIST GADOBUTROL 1 MMOL/ML IV SOLN COMPARISON:  None. FINDINGS: Alignment: Physiologic. Vertebrae: No fracture, evidence of discitis, or bone lesion. Cord: Normal signal and morphology. Posterior Fossa, vertebral arteries, paraspinal tissues: Negative. Disc levels: C1-2: Unremarkable. C2-3: Normal disc space and facet joints. There is no spinal canal stenosis. No neural foraminal stenosis. C3-4: Small disc bulge with bilateral uncovertebral hypertrophy. There is no spinal canal stenosis. Moderate right and severe left neural foraminal stenosis. C4-5: Small disc bulge with right-greater-than-left facet hypertrophy and uncovertebral spurring. There is no spinal canal stenosis. Severe bilateral neural foraminal stenosis. C5-6: Disc space narrowing with intermediate disc osteophyte complex. Mild spinal canal stenosis. Mild right and severe left neural foraminal stenosis. C6-7: Small disc bulge. There is no spinal canal stenosis. No neural foraminal stenosis. C7-T1: Normal disc space and facet joints. There is no spinal canal stenosis. No neural foraminal stenosis. IMPRESSION: 1. Mild spinal canal stenosis and severe left neural foraminal stenosis at C5-6. 2. Severe bilateral C4-5 neural foraminal stenosis. 3. Moderate right and severe left C3-4 neural foraminal stenosis. Electronically Signed   By: Ulyses Jarred M.D.   On: 07/19/2019 01:48   DG Chest Port 1 View  Result Date: 07/13/2019 CLINICAL DATA:  Empyema with chest tube present EXAM: PORTABLE CHEST 1 VIEW COMPARISON:  Jul 12, 2019 FINDINGS: Stable chest tube positioning on the right. No pneumothorax. Small right pleural effusion with right base atelectasis present. Lungs elsewhere clear. Heart size and pulmonary vascularity are normal. No adenopathy. There is degenerative change in the thoracic spine. IMPRESSION: Stable chest tube positioning without pneumothorax. Small right pleural effusion  with right base atelectasis present. Lungs elsewhere clear. Stable cardiac silhouette. Electronically Signed   By: Lowella Grip III M.D.   On: 07/13/2019 09:31   DG Chest Port 1 View  Result Date: 07/11/2019 CLINICAL DATA:  Empyema. EXAM: PORTABLE CHEST 1 VIEW COMPARISON:  Yesterday FINDINGS: Additional right-sided pleural catheter placed in the interim. There is improved aeration at the right base with decreased pleural fluid. Normal heart size. No visible pneumothorax. IMPRESSION: Improved right base aeration after additional catheter placement. Electronically Signed   By: Monte Fantasia M.D.   On: 07/11/2019 08:56   DG Chest Port 1 View  Result Date: 07/10/2019 CLINICAL DATA:  Atelectasis EXAM: PORTABLE CHEST 1 VIEW COMPARISON:  Portable exam at 0743 hrs compared to 07/09/2019 FINDINGS: Upper-normal size of cardiac silhouette. Mediastinal contours and pulmonary vascularity normal. Persistent RIGHT pleural effusion and basilar atelectasis. LEFT lung clear. No acute infiltrate or pneumothorax. Endplate spur formation thoracic spine, mild. IMPRESSION: Persistent  RIGHT pleural effusion and basilar atelectasis. Electronically Signed   By: Lavonia Dana M.D.   On: 07/10/2019 08:20   DG Chest Port 1 View  Result Date: 07/09/2019 CLINICAL DATA:  Empyema, RIGHT-sided chest tube in place. EXAM: PORTABLE CHEST 1 VIEW COMPARISON:  Chest x-ray dated 07/07/2019. FINDINGS: RIGHT basilar chest tube appears stable in position. The overlying RIGHT pleural fluid is stable in extent. LEFT lung remains clear. No pneumothorax is seen. Heart size and mediastinal contours are stable. IMPRESSION: Stable chest x-ray. RIGHT-sided chest tube is stable in position. The overlying RIGHT pleural fluid is stable in extent. Electronically Signed   By: Franki Cabot M.D.   On: 07/09/2019 10:10   DG Chest Port 1 View  Result Date: 07/07/2019 CLINICAL DATA:  Follow-up right chest tube. EXAM: PORTABLE CHEST 1 VIEW COMPARISON:   07/06/2018 FINDINGS: Chest tube remains in place at the right lung base. There is slightly less pleural fluid than was seen yesterday. No pleural air is visible. Some pleural fluid does persist, with right lower lung atelectasis. Left hemithorax appears clear. IMPRESSION: Persistent right chest tube. Slightly less right pleural fluid and less pleural air. Moderate amount of pleural fluid does persist with lower right lung atelectasis. Electronically Signed   By: Nelson Chimes M.D.   On: 07/07/2019 11:55   DG Chest Port 1 View  Result Date: 07/06/2019 CLINICAL DATA:  Right empyema. EXAM: PORTABLE CHEST 1 VIEW COMPARISON:  Chest x-ray dated Jul 04, 2019. FINDINGS: New right pigtail chest tube with unchanged small loculated pleural effusion. The left lung is clear. No pneumothorax. The heart size and mediastinal contours are within normal limits. Normal pulmonary vascularity. No acute osseous abnormality. IMPRESSION: 1. New right-sided chest tube with unchanged small loculated pleural effusion. No pneumothorax. Electronically Signed   By: Titus Dubin M.D.   On: 07/06/2019 09:17   DG Hand Complete Left  Result Date: 07/18/2019 CLINICAL DATA:  Left hand pain and swelling described over the index finger MCP joint. EXAM: LEFT HAND - COMPLETE 3+ VIEW COMPARISON:  None. FINDINGS: No fracture or bone lesion. Mild asymmetric joint space narrowing of several of the interphalangeal joints. There is dorsal marginal spurring at the PIP joints of the second, third and fourth fingers. MCP joints are relatively well maintained. No erosions. Soft tissue prominence is noted adjacent to the spurring at the PIP joints of the second, third and fourth fingers, and along the proximal aspect of the index finger. IMPRESSION: 1. No fracture or dislocation. 2. Arthropathic changes involving the interphalangeal joints most evident at the PIP joints of the second, third and fourth fingers. Soft tissue swelling is most prominent at the  base of the index finger. No joint erosions. Electronically Signed   By: Lajean Manes M.D.   On: 07/18/2019 10:26   CT IMAGE GUIDED DRAINAGE BY PERCUTANEOUS CATHETER  Result Date: 07/10/2019 INDICATION: 74 year old with a loculated right pleural effusion. Patient already has one right chest tube but there is residual right pleural fluid and request for a second chest tube placement. EXAM: CT-GUIDED RIGHT CHEST TUBE PLACEMENT MEDICATIONS: Moderate sedation ANESTHESIA/SEDATION: 1.0 mg IV Versed 50 mcg IV Fentanyl Moderate Sedation Time:  18 minutes The patient was continuously monitored during the procedure by the interventional radiology nurse under my direct supervision. COMPLICATIONS: None immediate. TECHNIQUE: Informed written consent was obtained from the patient after a thorough discussion of the procedural risks, benefits and alternatives. All questions were addressed. A timeout was performed prior to the initiation of  the procedure. PROCEDURE: Patient was placed prone on the CT scanner. Pocket of fluid along the posterior aspect of the right chest was identified and targeted. The right posterior chest was prepped with chlorhexidine and sterile field was created. Skin and soft tissues were anesthetized with 1% lidocaine. Using CT guidance, a Yueh catheter was directed into the pleural space and yellow fluid was aspirated. Superstiff Amplatz wire was advanced into the pleural space. The tract was dilated to accommodate a 14 [place] multipurpose drain. Small amount of additional pleural fluid was aspirated. Chest tube was attached to the drainage system. Catheter was sutured to skin and a dressing was placed. FINDINGS: Loculated right pleural fluid. Pocket along the right posterior aspect of the chest was identified and targeted for drain placement. 72 French drain was successfully placed in the right pleural space. Small amount of yellow fluid was removed. Pocket of pleural air noted following chest tube  placement compatible with the loculated nature of the pleural effusion. IMPRESSION: CT-guided placement of a right chest tube. Right pleural fluid is loculated. Electronically Signed   By: Markus Daft M.D.   On: 07/10/2019 13:35   CT IMAGE GUIDED DRAINAGE BY PERCUTANEOUS CATHETER  Result Date: 07/05/2019 INDICATION: Right empyema EXAM: CT-GUIDED PLACEMENT RIGHT 10 FRENCH CHEST TUBE MEDICATIONS: The patient is currently admitted to the hospital and receiving intravenous antibiotics. The antibiotics were administered within an appropriate time frame prior to the initiation of the procedure. ANESTHESIA/SEDATION: Fentanyl 50 mcg IV; Versed 1.0 mg IV Moderate Sedation Time:  11 MINUTES The patient was continuously monitored during the procedure by the interventional radiology nurse under my direct supervision. COMPLICATIONS: None immediate. PROCEDURE: Informed written consent was obtained from the patient after a thorough discussion of the procedural risks, benefits and alternatives. All questions were addressed. Maximal Sterile Barrier Technique was utilized including caps, mask, sterile gowns, sterile gloves, sterile drape, hand hygiene and skin antiseptic. A timeout was performed prior to the initiation of the procedure. Previous imaging reviewed. Patient positioned right anterior oblique. Noncontrast localization CT performed. The loculated right effusion was localized and marked for a lateral posterior approach. Under sterile conditions and local anesthesia, 18 gauge introducer needle was advanced from a posterolateral lower intercostal approach into the pleural fluid. Needle position confirmed with CT. Guidewire inserted followed by tract dilatation insert a 10 French drain. Drain catheter position confirmed with CT. Syringe aspiration yielded 10 cc of thick mucinous exudative fluid. Sample sent for pan culture. Catheter secured with Prolene suture and connected to external pleura vac. Sterile dressing applied.  No immediate complication. Patient tolerated the procedure well. IMPRESSION: Successful CT-guided 10 French right chest tube insertion. Electronically Signed   By: Jerilynn Mages.  Shick M.D.   On: 07/05/2019 15:44   US THORACENTESIS ASP PLEURAL SPACE W/IMG GUIDE  Result Date: 07/04/2019 CLINICAL DATA:  Loculated right pleural effusion. EXAM: ULTRASOUND GUIDED RIGHT THORACENTESIS COMPARISON:  CT of the chest on 06/29/2019 PROCEDURE: An ultrasound guided thoracentesis was thoroughly discussed with the patient and questions answered. The benefits, risks, alternatives and complications were also discussed. The patient understands and wishes to proceed with the procedure. Written consent was obtained. Ultrasound was performed to localize and mark an adequate pocket of fluid in the right chest. The area was then prepped and draped in the normal sterile fashion. 1% Lidocaine was used for local anesthesia. Under ultrasound guidance a 6 French Safe-T-Centesis catheter was introduced. Fluid was removed from the catheter. The catheter was then removed. Under ultrasound guidance, a 5  [place] centesis catheter was then introduced in a slightly higher pocket of pleural fluid in the right posterior chest. Aspiration was performed through the centesis catheter. The catheter was removed and a dressing applied. FINDINGS: Ultrasound shows a very complex appearing and loculated right pleural effusion with relatively small components posteriorly. From the first pocket sampled in the right posterior pleural space towards the lung base, only 10 mL of clear appearing amber fluid was able to be aspirated. The sample was sent for various requested labs. Decision was made to sample a second slightly higher pocket yielding 5 mL of grossly purulent whitish green thick fluid. This sample was sent for culture analysis. IMPRESSION: Highly loculated and complex right pleural effusion with relatively small pockets posteriorly. The first pocket yielded  10 mL of clear appearing amber fluid. The second more superior pocket yielded 5 mL of grossly purulent fluid likely consistent with empyema. Fluid analysis will be performed on both samples. Electronically Signed   By: Aletta Edouard M.D.   On: 07/04/2019 14:24      ASSESSMENT/PLAN   Community acquired Pneumonia with empyema   s/p chest tube x2 with tPA  -present on admission - due to gram positive cocci such as streptococcus or staph aureus - patient has had numerous courses of antibiotics on outpatient basis- higher risk for resistant bacteria -s/p thoracentesis with - GPC + -procalcitonin -MRSA nasal PCR  -bronchopulmonary hygiene with Acapella and IS x10/h -Recruitment maneuvers for atelecatatic right lung - metaneb q4h -continue PT -CXR 07/12/19- improved from previous   Empyema of right pleural space  - +GPCs -CT surgery on case Dr Genevive Bi -appreciate input -patient is being optimized for thoracic surgery    Joint swelling and pain   - patient may have had allergic reaction to augmentin on outpatient  - additional concern for possible autoimmune phenomenon post pneumonia with GPCs   - will obtain serology for autoimmune workup    -low dose solumedrol IV daily 40mg  and bendadryl q6h 25mg      Neck pain    - no grossly appreciable stiffness    - possible related to poor sleep architecture post hospital d/c   - additionally MRI neck with severe C4 foraminal stenosis   - some concern for potential CNS infection - will obtain CSF specimen to rule out meningitis   - empirically on cefepime / vancomycin   - CSF studies are abnormal - in context of recent infection with pneumonia and empyema - will ask neurology and ID to evaluate prior to patient having thoracic surgery   -discussed with neurosurgery - no additional intervention is required for severe spinal foraminal stenosis.  Patient may have surgery and no contraindications for general anesthesia/ RSI      Appreciate  collaboration from all providers.    Thank you for allowing me to participate in the care of this patient.  .   Patient/Family are satisfied with care plan and all questions have been answered.    This document was prepared using Dragon voice recognition software and may include unintentional dictation errors.     Ottie Glazier, M.D.  [facility] [place]

## 2019-07-19 NOTE — Progress Notes (Signed)
Andrew Myers Follow Up Note  Patient ID: Andrew Myers, male   DOB: [birth year], 74 y.o.   MRN: Andrew Myers  HISTORY: Andrew Myers was seen by Dr. Lanney Myers this morning and complained that he felt his neck and arms were significantly worse.  After an extensive discussion we elected to hold off on his surgical procedure today as I explained I am not entirely certain that the pleural space is the source of his fever.  This afternoon the patient states that he may feel slightly better.  However he continues to complain and neck and joint discomfort.    Vitals:   07/19/19 1000 07/19/19 1338  BP: 129/69 (!) 154/71  Pulse: 85 86  Resp: 18 15  Temp:  98.6 F (37 C)  SpO2: 94% 97%     EXAM:  Resp: Lungs are clear bilaterally.  No respiratory distress, normal effort. Heart:  Regular without murmurs Abd:  Abdomen is soft, non distended and non tender. No masses are palpable.  There is no rebound and no guarding.  Neurological: Alert and oriented to person, place, and time. Coordination normal.  Skin: Skin is warm and dry. No rash noted. No diaphoretic. No erythema. No pallor.  Psychiatric: Normal mood and affect. Normal behavior. Judgment and thought content normal.      ASSESSMENT: He underwent a lumbar puncture.  Those culture results will be pending.  He will be seen by our neurologist and neurosurgeons.   PLAN:   At the present time there is no acute indication for surgical intervention.  We will hold off on that for the time being.    Andrew Lewandowsky, MD

## 2019-07-20 LAB — TYPE AND SCREEN
ABO/RH(D): A POS
Antibody Screen: NEGATIVE
Unit division: 0
Unit division: 0

## 2019-07-20 LAB — CHLAMYDIA/NGC RT PCR (ARMC ONLY)
Chlamydia Tr: NOT DETECTED
N gonorrhoeae: NOT DETECTED

## 2019-07-20 LAB — C3 COMPLEMENT: C3 Complement: 201 mg/dL — ABNORMAL HIGH (ref 82–167)

## 2019-07-20 LAB — ANA COMPREHENSIVE PANEL
Anti JO-1: <0.2 AI (ref 0.0–0.9)
Centromere Ab Screen: <0.2 AI (ref 0.0–0.9)
Chromatin Ab SerPl-aCnc: <0.2 AI (ref 0.0–0.9)
ENA SM Ab Ser-aCnc: <0.2 AI (ref 0.0–0.9)
Ribonucleic Protein: 0.4 AI (ref 0.0–0.9)
SSA (Ro) (ENA) Antibody, IgG: <0.2 AI (ref 0.0–0.9)
SSB (La) (ENA) Antibody, IgG: <0.2 AI (ref 0.0–0.9)
Scleroderma (Scl-70) (ENA) Antibody, IgG: 0.3 AI (ref 0.0–0.9)
ds DNA Ab: <1 IU/mL (ref 0–9)

## 2019-07-20 LAB — BPAM RBC
Blood Product Expiration Date: 202106072359
Blood Product Expiration Date: 202106082359
Unit Type and Rh: 6200
Unit Type and Rh: 6200

## 2019-07-20 LAB — GLUCOSE, CAPILLARY: Glucose-Capillary: 146 mg/dL — ABNORMAL HIGH (ref 70–99)

## 2019-07-20 LAB — ANTISTREPTOLYSIN O TITER: ASO: 151 IU/mL (ref 0.0–200.0)

## 2019-07-20 LAB — VDRL, CSF: VDRL Quant, CSF: NONREACTIVE

## 2019-07-20 LAB — C4 COMPLEMENT: Complement C4, Body Fluid: 36 mg/dL (ref 12–38)

## 2019-07-20 LAB — RHEUMATOID FACTOR: Rheumatoid fact SerPl-aCnc: 17.5 IU/mL — ABNORMAL HIGH (ref 0.0–13.9)

## 2019-07-20 LAB — HSV DNA BY PCR (REFERENCE LAB)
HSV 1 DNA: NEGATIVE
HSV 2 DNA: NEGATIVE

## 2019-07-20 LAB — PROCALCITONIN: Procalcitonin: 0.23 ng/mL

## 2019-07-20 MED ORDER — METHYLPREDNISOLONE SODIUM SUCC 40 MG IJ SOLR
20.0000 mg | Freq: Every day | INTRAMUSCULAR | Status: DC
  Administered 2019-07-21 – 2019-07-24 (×4): 20 mg via INTRAVENOUS
  Filled 2019-07-20 (×4): qty 1

## 2019-07-20 NOTE — Progress Notes (Signed)
PROGRESS NOTE    Andrew Myers  [name] DOB: 11-Jun-1945 DOA: 07/18/2019 PCP: Derinda Late, MD   Brief Narrative:   [place] is a 74 y.o. male with medical history significant of hypertension, hyperlipidemia, OSA, BPH, left bundle blockage, recent admission due to right empyema, who presents with fever, joint pain and neck pain.  Patient was recently hospitalized from 5/12-5/21 due to right empyema.  Patient is s/p for thoracentesis and had positive culture for gram-positive bacteria.  Patient is discharged on Augmentin.  Patient states that he developed fever 102.4 yesterday afternoon. He has joint pain in left index finger and right wrist.  The left index finger is red and swelling. He also has left-sided neck pain which is worse on movement from side to side. Patient denies chest pain, shortness breath, cough. CT chest with a small loculated collection in the posterior right chest. MRI neck with evidence of mild spinal canal stenosis and severe left neural foraminal stenosis at C5-6, severe bilateral C4-5 neural foraminal stenosis, and moderate right and severe left C3-4 neural foraminal stenosis. Had LP done today to rule out meningitis.  Pulmonary, neurosurgery and ID is on board.  Subjective: Patient was feeling better today.  His neck pain and joint pains has been improved.  Assessment & Plan:   Principal Problem:   Empyema of right pleural space (HCC) Active Problems:   Hypertension   Hyperlipidemia   BPH (benign prostatic hyperplasia)   Hypokalemia   Neck pain   Empyema (HCC)   Joint pain  Empyema of right pleural space [facility]): s/p of thoracentesis recently.  Still has fever. Dr. Genevive Bi of CT surgery is consulted --> planing to do right thoracotomy and decortication of lung.  Pulmonary also got involved and they of surgery and suggested to do an LP to rule out meningitis.  ID was also consulted by pulmonary.   -LP was done-CSF with lymphocytic  pleocytosis with normal protein and glucose.  More consistent with aseptic meningitis/serum sickness. -Antibiotics were switched with cefepime and vancomycin by pulmonary -He was started on steroid with consern of autoimmune etiology.  C3 levels are high with C4 normal. -ID was recommending to involve rheumatology for the concern of serum sickness.  Generalized joint pain/neck pain.  MRI Neck with some foraminal stenosis, negative for any infection.  Neurosurgery was consulted by pulmonary and they do not think that he needs any surgical intervention at this time.  No cervical myelopathy. -ID wants to consult rheumatology for the concern of serum sickness. -Page Dr. Kathaleen Grinder response, we will try getting him an outpatient appointment ASAP.  HTN:  -Continue home medications: Amlodipine, Bystolic, irbesartan -hydralazine prn -Hold HCTZ while patient is on n.p.o.  Hyperlipidemia -lipitor  BPH (benign prostatic hyperplasia): -flomax  Hypokalemia: K=3.3 on admission. - Repleted - Check Mg level  Objective: Vitals:   07/19/19 2036 07/20/19 0139 07/20/19 0407 07/20/19 1209  BP: (!) 156/86 138/68 126/68 139/70  Pulse: (!) 56 77 68 75  Resp:  16 20 18   Temp: 97.9 F (36.6 C) 97.7 F (36.5 C) (!) 97.4 F (36.3 C) 97.6 F (36.4 C)  TempSrc: Oral Oral Oral Oral  SpO2: 96% 95% 97% 96%  Weight:      Height:        Intake/Output Summary (Last 24 hours) at 07/20/2019 1922 Last data filed at 07/20/2019 1916 Gross per 24 hour  Intake 1168.42 ml  Output 2100 ml  Net -931.58 ml   [employer]  07/18/19 0916  Weight: 96.2 kg    Examination:  General exam: Appears calm and comfortable  Respiratory system: Clear to auscultation, breath sounds at right base, respiratory effort normal. Cardiovascular system: S1 & S2 heard, RRR. No JVD, murmurs, rubs, gallops or clicks. Gastrointestinal system: Soft, nontender, nondistended, bowel sounds positive. Central nervous  system: Alert and oriented. No focal neurological deficits.Symmetric 5 x 5 power. Extremities: No edema, no cyanosis, pulses intact and symmetrical. Psychiatry: Judgement and insight appear normal.    DVT prophylaxis: SCDs Code Status: Full Family Communication: Discussed with patient Disposition Plan:  Status is: Inpatient  Remains inpatient appropriate because:Inpatient level of care appropriate due to severity of illness   Dispo: The patient is from: Home              Anticipated d/c is to: Home              Anticipated d/c date is: 2 days              Patient currently is not medically stable to d/c.  Consultants:  Thoracic surgery Pulmonology Neurosurgery ID Rheumatology  Procedures:  Antimicrobials:  Cefepime Vancomycin  Data Reviewed: I have personally reviewed following labs and imaging studies  CBC: Recent Labs  Lab 07/14/19 0727 07/18/19 1018 07/19/19 0447 07/19/19 1403  WBC 9.9 9.4 14.9* 13.9*  NEUTROABS 7.2 7.5  --  12.3*  HGB 11.8* 12.4* 12.1* 12.4*  HCT 34.8* 37.1* 36.8* 36.8*  MCV 82.7 83.2 84.0 83.1  PLT 224 279 261 0000000   Basic Metabolic Panel: Recent Labs  Lab 07/14/19 0727 07/18/19 1018 07/19/19 0447 07/19/19 1403  NA 136 134* 130* 131*  K 3.2* 3.3* 3.3* 3.5  CL 101 98 96* 96*  CO2 25 24 21* 24  GLUCOSE 137* 152* 161* 161*  BUN 10 13 10 10   CREATININE 0.41* 0.62 0.69 0.60*  CALCIUM 8.5* 9.2 8.7* 8.6*  MG  --   --  1.5*  --    GFR: Estimated Creatinine Clearance: 98.8 mL/min (A) (by C-G formula based on SCr of 0.6 mg/dL (L)). Liver Function Tests: Recent Labs  Lab 07/18/19 1018 07/19/19 1403  AST 28 18  ALT 33 23  ALKPHOS 51 47  BILITOT 1.0 1.2  PROT 8.1 7.6  ALBUMIN 3.3* 3.0*   No results for input(s): LIPASE, AMYLASE in the last 168 hours. No results for input(s): AMMONIA in the last 168 hours. Coagulation Profile: Recent Labs  Lab 07/19/19 0447  INR 1.2   Cardiac Enzymes: Recent Labs  Lab 07/19/19 1535    CKTOTAL 38*   BNP (last 3 results) No results for input(s): PROBNP in the last 8760 hours. HbA1C: No results for input(s): HGBA1C in the last 72 hours. CBG: Recent Labs  Lab 07/20/19 0807  GLUCAP 146*   Lipid Profile: No results for input(s): CHOL, HDL, LDLCALC, TRIG, CHOLHDL, LDLDIRECT in the last 72 hours. Thyroid Function Tests: No results for input(s): TSH, T4TOTAL, FREET4, T3FREE, THYROIDAB in the last 72 hours. Anemia Panel: No results for input(s): VITAMINB12, FOLATE, FERRITIN, TIBC, IRON, RETICCTPCT in the last 72 hours. Sepsis Labs: Recent Labs  Lab 07/18/19 1018 07/19/19 1403 07/20/19 0537  PROCALCITON <0.10 0.24 0.23    Recent Results (from the past 240 hour(s))  Blood culture (routine x 2)     Status: None (Preliminary result)   Collection Time: 07/18/19 10:18 AM   Specimen: BLOOD  Result Value Ref Range Status   Specimen Description BLOOD LEFT AC  Final  Special Requests   Final    BOTTLES DRAWN AEROBIC AND ANAEROBIC Blood Culture adequate volume   Culture   Final    NO GROWTH 2 DAYS Performed at [facility], [address] [place], [place] [address]    Report Status PENDING  Incomplete  Blood culture (routine x 2)     Status: None (Preliminary result)   Collection Time: 07/18/19 10:19 AM   Specimen: BLOOD  Result Value Ref Range Status   Specimen Description BLOOD LEFT HAND  Final   Special Requests   Final    BOTTLES DRAWN AEROBIC AND ANAEROBIC Blood Culture adequate volume   Culture   Final    NO GROWTH 2 DAYS Performed at [facility], [address], [place], [place] [address]    Report Status PENDING  Incomplete  SARS Coronavirus 2 by RT PCR (hospital order, performed in [place] hospital lab) Nasopharyngeal Nasopharyngeal Swab     Status: None   Collection Time: 07/18/19 12:52 PM   Specimen: Nasopharyngeal Swab  Result Value Ref Range Status   SARS Coronavirus 2 NEGATIVE NEGATIVE Final    Comment:  (NOTE) SARS-CoV-2 target nucleic acids are NOT DETECTED. The SARS-CoV-2 RNA is generally detectable in upper and lower respiratory specimens during the acute phase of infection. The lowest concentration of SARS-CoV-2 viral copies this assay can detect is 250 copies / mL. A negative result does not preclude SARS-CoV-2 infection and should not be used as the sole basis for treatment or other patient management decisions.  A negative result may occur with improper specimen collection / handling, submission of specimen other than nasopharyngeal swab, presence of viral mutation(s) within the areas targeted by this assay, and inadequate number of viral copies (<250 copies / mL). A negative result must be combined with clinical observations, patient history, and epidemiological information. Fact Sheet for Patients:   StrictlyIdeas.no Fact Sheet for Healthcare Providers: BankingDealers.co.za This test is not yet approved or cleared  by the [place] FDA and has been authorized for detection and/or diagnosis of SARS-CoV-2 by FDA under an Emergency Use Authorization (EUA).  This EUA will remain in effect (meaning this test can be used) for the duration of the COVID-19 declaration under Section 564(b)(1) of the Act, 21 U.S.C. section 360bbb-3(b)(1), unless the authorization is terminated or revoked sooner. Performed at [facility], [address] [place], [place] [address]   CSF culture     Status: None (Preliminary result)   Collection Time: 07/19/19  9:16 AM   Specimen: CSF; Cerebrospinal Fluid  Result Value Ref Range Status   Specimen Description   Final    CSF Performed at [facility], [address], [place], [place] [address]    Special Requests   Final    NONE Performed at [facility], [address] [place], [place] [address]    Gram Stain   Final    WBC SEEN RED BLOOD CELLS NO ORGANISMS  SEEN Performed at [facility], [address], [place],  [address]    Culture   Final    NO GROWTH < 24 HOURS Performed at [facility], [place] [address] [place],  [address]    Report Status PENDING  Incomplete     Radiology Studies: MR CERVICAL SPINE W WO CONTRAST  Result Date: 07/19/2019 CLINICAL DATA:  Left-sided neck pain EXAM: MRI CERVICAL SPINE WITHOUT AND WITH CONTRAST TECHNIQUE: Multiplanar and multiecho pulse sequences of the cervical spine, to  include the craniocervical junction and cervicothoracic junction, were obtained without and with intravenous contrast. CONTRAST:  29mL GADAVIST GADOBUTROL 1 MMOL/ML IV SOLN COMPARISON:  None. FINDINGS: Alignment: Physiologic. Vertebrae: No fracture, evidence of discitis, or bone lesion. Cord: Normal signal and morphology. Posterior Fossa, vertebral arteries, paraspinal tissues: Negative. Disc levels: C1-2: Unremarkable. C2-3: Normal disc space and facet joints. There is no spinal canal stenosis. No neural foraminal stenosis. C3-4: Small disc bulge with bilateral uncovertebral hypertrophy. There is no spinal canal stenosis. Moderate right and severe left neural foraminal stenosis. C4-5: Small disc bulge with right-greater-than-left facet hypertrophy and uncovertebral spurring. There is no spinal canal stenosis. Severe bilateral neural foraminal stenosis. C5-6: Disc space narrowing with intermediate disc osteophyte complex. Mild spinal canal stenosis. Mild right and severe left neural foraminal stenosis. C6-7: Small disc bulge. There is no spinal canal stenosis. No neural foraminal stenosis. C7-T1: Normal disc space and facet joints. There is no spinal canal stenosis. No neural foraminal stenosis. IMPRESSION: 1. Mild spinal canal stenosis and severe left neural foraminal stenosis at C5-6. 2. Severe bilateral C4-5 neural foraminal stenosis. 3. Moderate right and severe left C3-4 neural foraminal stenosis. Electronically Signed    By: Ulyses Jarred M.D.   On: 07/19/2019 01:48   DG Lumbar Puncture Fluoro Guide  Result Date: 07/19/2019 CLINICAL DATA:  Neck pain, possible meningitis EXAM: DIAGNOSTIC LUMBAR PUNCTURE UNDER FLUOROSCOPIC GUIDANCE FLUOROSCOPY TIME:  Fluoroscopy Time:  4 minutes 42 seconds Radiation Exposure Index (if provided by the fluoroscopic device): 150 mGy Number of Acquired Spot Images: 2 PROCEDURE: Informed consent was obtained from the patient prior to the procedure, including potential complications of headache, allergy, and pain. With the patient prone, the lower back was prepped with Betadine. 1% Lidocaine was used for local anesthesia. Lumbar puncture was performed at the L3 level using a 20 gauge needle with return of clear CSF with an opening pressure of 15 cm water. Sixteen ml of CSF were obtained for laboratory studies. The patient tolerated the procedure well and there were no apparent complications. IMPRESSION: Successful fluoro guided lumbar puncture for fluid. Electronically Signed   By: Inez Catalina M.D.   On: 07/19/2019 12:34    Scheduled Meds: . amLODipine  5 mg Oral Daily  . aspirin EC  81 mg Oral BID  . atorvastatin  20 mg Oral QHS  . irbesartan  150 mg Oral Daily  . loratadine  10 mg Oral Daily  . [START ON 07/21/2019] methylPREDNISolone (SOLU-MEDROL) injection  20 mg Intravenous Daily  . multivitamin with minerals  1 tablet Oral Daily  . nebivolol  5 mg Oral Daily  . tamsulosin  0.8 mg Oral QPC supper   Continuous Infusions: . ceFEPime (MAXIPIME) IV Stopped (07/20/19 1459)     LOS: 2 days   Time spent: 45 minutes.  Lorella Nimrod, MD [facility]  If 7PM-7AM, please contact night-coverage [email or web address]  07/20/2019, 7:22 PM   This record has been created using Systems analyst. Errors have been sought and corrected,but may not always be located. Such creation errors do not reflect on the standard of care.

## 2019-07-20 NOTE — Progress Notes (Signed)
Initial Nutrition Assessment  DOCUMENTATION CODES:   Not applicable  INTERVENTION:  Patient reports he does not need an oral nutrition supplement at this time. Encouraged adequate intake of protein at meals.  NUTRITION DIAGNOSIS:   Inadequate oral intake related to decreased appetite(prior to admission) as evidenced by per patient/family report.  GOAL:   Patient will meet greater than or equal to 90% of their needs  MONITOR:   PO intake, Labs, Weight trends, I & O's  REASON FOR ASSESSMENT:   Malnutrition Screening Tool    ASSESSMENT:   74 year old male with PMHx of HTN, BPH, HLD, sleep apnea, left bundle blockage, recent admission due to right empyema admitted with empyema of right pleural space, generalized joint pain/neck pain.   Met with patient at bedside. He reports his appetite was decreased for several days due to generally not feeling well. He reports at baseline he has a good appetite and eats 2-3 meals per day. His appetite is starting to improve now. He is now eating 100% of his meals here. Patient reports he does not need an oral nutrition supplement at this time. Encouraged adequate intake of protein.  He had recently been trying to intentionally lose weight. He reports his UBW was 230 lbs. According to chart patient was 103.4 kg on 05/11/2019. He is currently 96.2 kg. He has lost 7.2 kg (7% body weight) over the past 2 months, which is significant for time frame.  Medications reviewed and include: Solu-Medrol 40 mg daily IV, MVI daily, cefepime, vancomycin.  Labs reviewed: CBG 146. On 5/26 Sodium 131, Chloride 96, Creatinine 0.6.  Patient does not meet criteria for malnutrition at this time.  NUTRITION - FOCUSED PHYSICAL EXAM:    Most Recent Value  Orbital Region  No depletion  Upper Arm Region  Mild depletion  Thoracic and Lumbar Region  No depletion  Buccal Region  No depletion  Temple Region  No depletion  Clavicle Bone Region  No depletion  Clavicle  and Acromion Bone Region  No depletion  Scapular Bone Region  No depletion  Dorsal Hand  No depletion  Patellar Region  No depletion  Anterior Thigh Region  No depletion  Posterior Calf Region  No depletion  Edema (RD Assessment)  Mild  Hair  Reviewed  Eyes  Reviewed  Mouth  Reviewed  Skin  Reviewed  Nails  Reviewed     Diet Order:   Diet Order            Diet Heart Room service appropriate? Yes; Fluid consistency: Thin  Diet effective now             EDUCATION NEEDS:   Education needs have been addressed  Skin:  Skin Assessment: Reviewed RN Assessment  Last BM:  07/18/2019  Height:   Ht Readings from Last 1 Encounters:  07/18/19 '5\' 11"'  (1.803 m)   Weight:   Wt Readings from Last 1 Encounters:  07/18/19 96.2 kg   BMI:  Body mass index is 29.57 kg/m.  Estimated Nutritional Needs:   Kcal:  2200-2400  Protein:  110-120 grams  Fluid:  2.2 L/day  Jacklynn Barnacle, MS, RD, LDN Pager number available on Amion

## 2019-07-20 NOTE — Progress Notes (Signed)
Pulmonary Medicine          Date: 07/20/2019,   MRN# [id number] Andrew Myers [birth year]     AdmissionWeight: 96.2 kg                 CurrentWeight: 96.2 kg   Refering physician:Dr Agbata   CHIEF COMPLAINT:   Empyema s/p intrapleural drain with recurrence of symptoms   SUBJECTIVE   -Patient is sitting up in bed in no distress.  He shares joint pains are better , neck pain resolved. Continues to have shoulder pain bilaterally with decreased ROM.    Discussed case with multiple subspecialists appreciate all involved.   -Decreasing diphenhydramine and IV steroids today.    PAST MEDICAL HISTORY   Past Medical History:  Diagnosis Date  . BPH (benign prostatic hyperplasia)   . CAP (community acquired pneumonia) 04/2019  . Empyema lung ([place]) 06/2019  . Hyperlipidemia   . Hypertension   . Obesity   . Pleural effusion on right 05/2019  . Skin cancer    BCC, SCC  . Sleep apnea   . Sleep apnea      SURGICAL HISTORY   Past Surgical History:  Procedure Laterality Date  . APPENDECTOMY    . CATARACT EXTRACTION    . FRACTURE SURGERY       FAMILY HISTORY   Family History  Problem Relation Age of Onset  . Breast cancer Mother   . Hypertension Mother   . Hyperlipidemia Mother   . Heart failure Mother   . Heart attack Father   . Thyroid cancer Sister      SOCIAL HISTORY   Social History   Tobacco Use  . Smoking status: Former Smoker    Quit date: 05/07/1967    Years since quitting: 52.2  . Smokeless tobacco: Never Used  Substance Use Topics  . Alcohol use: Yes    Comment: rarely  . Drug use: No     MEDICATIONS    Home Medication:    Current Medication:  Current Facility-Administered Medications:  .  acetaminophen (TYLENOL) tablet 650 mg, 650 mg, Oral, Q6H PRN, Ivor Costa, MD, 650 mg at 07/18/19 2036 .  amLODipine (NORVASC) tablet 5 mg, 5 mg, Oral, Daily, Ivor Costa, MD, 5 mg at 07/20/19 1005 .  aspirin EC tablet 81 mg, 81  mg, Oral, BID, Ivor Costa, MD, 81 mg at 07/20/19 1005 .  atorvastatin (LIPITOR) tablet 20 mg, 20 mg, Oral, QHS, Ivor Costa, MD, 20 mg at 07/19/19 2312 .  ceFEPIme (MAXIPIME) 2 g in sodium chloride 0.9 % 100 mL IVPB, 2 g, Intravenous, Q8H, Nazari, Walid A, RPH, Last Rate: 200 mL/hr at 07/20/19 1429, 2 g at 07/20/19 1429 .  diphenhydrAMINE (BENADRYL) capsule 25 mg, 25 mg, Oral, TID PRN **OR** diphenhydrAMINE (BENADRYL) injection 25 mg, 25 mg, Intravenous, TID PRN, Lorella Nimrod, MD .  hydrALAZINE (APRESOLINE) injection 5 mg, 5 mg, Intravenous, Q2H PRN, Ivor Costa, MD .  hydrOXYzine (VISTARIL) injection 25 mg, 25 mg, Intramuscular, Q6H PRN, Ivor Costa, MD .  irbesartan (AVAPRO) tablet 150 mg, 150 mg, Oral, Daily, Ivor Costa, MD, 150 mg at 07/20/19 1005 .  loratadine (CLARITIN) tablet 10 mg, 10 mg, Oral, Daily, Ivor Costa, MD, 10 mg at 07/20/19 1005 .  methylPREDNISolone sodium succinate (SOLU-MEDROL) 40 mg/mL injection 40 mg, 40 mg, Intravenous, Daily, Lanney Gins, Dann Galicia, MD, 40 mg at 07/20/19 1004 .  multivitamin with minerals tablet 1 tablet, 1 tablet, Oral, Daily, Ivor Costa, MD, 1 tablet  at 07/20/19 1005 .  nebivolol (BYSTOLIC) tablet 5 mg, 5 mg, Oral, Daily, Ivor Costa, MD, 5 mg at 07/20/19 1005 .  oxyCODONE-acetaminophen (PERCOCET/ROXICET) 5-325 MG per tablet 1 tablet, 1 tablet, Oral, Q4H PRN, Ivor Costa, MD, 1 tablet at 07/19/19 2319 .  tamsulosin (FLOMAX) capsule 0.8 mg, 0.8 mg, Oral, QPC supper, Ivor Costa, MD, 0.8 mg at 07/19/19 1830 .  vancomycin (VANCOREADY) IVPB 1250 mg/250 mL, 1,250 mg, Intravenous, Q12H, Lorella Nimrod, MD, Last Rate: 166.7 mL/hr at 07/20/19 1136, 1,250 mg at 07/20/19 1136    ALLERGIES   Codeine     REVIEW OF SYSTEMS    Review of Systems:  Gen:  Denies  fever, sweats, chills weigh loss  HEENT: Denies blurred vision, double vision, ear pain, eye pain, hearing loss, nose bleeds, sore throat+neck pain  Cardiac:  No dizziness, chest pain or heaviness, chest  tightness,edema Resp:   Denies cough or sputum porduction, shortness of breath,wheezing, hemoptysis,  Gi: Denies swallowing difficulty, stomach pain, nausea or vomiting, diarrhea, constipation, bowel incontinence Gu:  Denies bladder incontinence, burning urine Ext:   Admits to joint pain upper extermiteis including elbows, shoulders and MCP Skin: +erythema over joints Endoc:  Denies polyuria, polydipsia , polyphagia or weight change Psych:   Denies depression, insomnia or hallucinations   Other:  All other systems negative   VS: BP 139/70   Pulse 75   Temp 97.6 F (36.4 C) (Oral)   Resp 18   Ht 5\' 11"  (1.803 m)   Wt 96.2 kg   SpO2 96%   BMI 29.57 kg/m      PHYSICAL EXAM    GENERAL:NAD, no fevers, chills, no weakness no fatigue HEAD: Normocephalic, atraumatic.  EYES: Pupils equal, round, reactive to light. Extraocular muscles intact. No scleral icterus.  MOUTH: Moist mucosal membrane. Dentition intact. No abscess noted.  EAR, NOSE, THROAT: Clear without exudates. No external lesions.  NECK: Supple. No thyromegaly. No nodules. No JVD.  PULMONARY: Clear to ausculatation bilaterally with mild decrement on right side CARDIOVASCULAR: S1 and S2. Regular rate and rhythm. No murmurs, rubs, or gallops. No edema. Pedal pulses 2+ bilaterally.  GASTROINTESTINAL: Soft, nontender, nondistended. No masses. Positive bowel sounds. No hepatosplenomegaly.  MUSCULOSKELETAL: No swelling, clubbing, or edema. Range of motion full in all extremities.  NEUROLOGIC: Cranial nerves II through XII are intact. No gross focal neurological deficits. Sensation intact. Reflexes intact.  SKIN: No ulceration, lesions, rashes, or cyanosis. Skin warm and dry. Turgor intact.  PSYCHIATRIC: Mood, affect within normal limits. The patient is awake, alert and oriented x 3. Insight, judgment intact.       IMAGING    DG Chest 1 View  Result Date: 07/04/2019 CLINICAL DATA:  Status post right thoracentesis. EXAM:  CHEST  1 VIEW COMPARISON:  CT of the chest on 06/29/2019 FINDINGS: The heart size and mediastinal contours are within normal limits. Residual lateral/posterolateral component of loculated pleural fluid remains on the right. No pneumothorax after right thoracentesis. No pulmonary edema. The visualized skeletal structures are unremarkable. IMPRESSION: No pneumothorax after right sided thoracentesis. Residual lateral/posterolateral component of loculated right pleural fluid remains. Electronically Signed   By: Aletta Edouard M.D.   On: 07/04/2019 14:26   DG Chest 2 View  Result Date: 07/18/2019 CLINICAL DATA:  Fever.  Chest tube removal EXAM: CHEST - 2 VIEW COMPARISON:  Jul 13, 2019 FINDINGS: Chest tube has been removed. No appreciable pneumothorax. There is a small right pleural effusion with ill-defined airspace opacity in the right mid  and lower lung zones. Left lung clear. Heart size and pulmonary vascularity are normal. No adenopathy. There is mild degenerative change in the thoracic spine. IMPRESSION: No evident pneumothorax. Small right pleural effusion with ill-defined opacity in the right mid and lower lung zones, likely combination of loculated pleural effusion and atelectasis. Mild pneumonia cannot be excluded in this area. Lungs elsewhere clear. Cardiac silhouette within normal limits. Electronically Signed   By: Lowella Grip III M.D.   On: 07/18/2019 10:57   DG Chest 2 View  Result Date: 07/12/2019 CLINICAL DATA:  Empyema.  Chest tube present. EXAM: CHEST - 2 VIEW COMPARISON:  07/11/2019 FINDINGS: Right basilar chest tube unchanged. Lungs are somewhat hypoinflated with evidence of a small amount right pleural fluid without significant change. No pneumothorax. Remainder of the lungs are clear. Cardiomediastinal silhouette and remainder of the exam is unchanged. IMPRESSION: Small amount right pleural fluid with right basilar chest tube unchanged. Electronically Signed   By: Marin Olp M.D.    On: 07/12/2019 08:25   CT CHEST WO CONTRAST  Result Date: 07/07/2019 CLINICAL DATA:  Follow-up scan for empyema of the RIGHT lung, chest tube placed 2 days ago EXAM: CT CHEST WITHOUT CONTRAST TECHNIQUE: Multidetector CT imaging of the chest was performed following the standard protocol without IV contrast. COMPARISON:  06/28/2009 and intervening interventional and ultrasound evaluations. FINDINGS: Cardiovascular: Calcified atheromatous plaque in the thoracic aorta similar to prior study. No aneurysmal dilation. Heart size is stable without pericardial effusion. Calcified coronary artery disease as before. Vessels not well assessed given lack of intravenous contrast. Mediastinum/Nodes: No thoracic inlet adenopathy. No axillary lymphadenopathy. No mediastinal adenopathy. Esophagus grossly normal. Lungs/Pleura: Since the previous examination there is been interval placement of a RIGHT-sided chest tube. A small amount of subcutaneous emphysema is noted. The marker for chest tube sideholes is at least 1.5 cm within the RIGHT chest cavity within the pleural space. Pleural fluid at the RIGHT lung base is perhaps slightly diminished but there is no change in the loculated fluid along the fissure in the RIGHT chest when compared to the prior exam RIGHT basilar consolidative changes and volume loss are similar to the prior study. LEFT chest is clear. Airways are patent. Upper Abdomen: Upper abdominal contents are unremarkable. No acute process in the upper abdomen. Musculoskeletal: Small amount of subcutaneous emphysema along the RIGHT chest wall as discussed. No chest wall mass. No acute bone process. Spinal degenerative change. IMPRESSION: 1. Slightly diminished RIGHT pleural fluid, dominant loculated area along the major fissure in the RIGHT chest is unchanged. 2. Persistent basilar consolidation/volume loss without change. This may represent rounded atelectasis or pneumonia. Follow-up is suggested to ensure there is  no underlying mass. 3. Small amount of subcutaneous emphysema along the chest wall. Marker for sideholes is within the pleural space as discussed above. 4. Atherosclerotic changes in the thoracic aorta. Calcified coronary artery disease. 5. Aortic atherosclerosis. Aortic Atherosclerosis (ICD10-I70.0). Electronically Signed   By: Zetta Bills M.D.   On: 07/07/2019 16:33   CT CHEST WO CONTRAST  Result Date: 06/29/2019 CLINICAL DATA:  Pleural effusion, shortness of breath. Follow-up chest x-ray EXAM: CT CHEST WITHOUT CONTRAST TECHNIQUE: Multidetector CT imaging of the chest was performed following the standard protocol without IV contrast. COMPARISON:  Chest x-ray 05/11/2019 FINDINGS: Cardiovascular: Heart is normal size. Aorta normal caliber. Scattered coronary artery and aortic calcifications. Mediastinum/Nodes: No mediastinal, hilar, or axillary adenopathy. Trachea and esophagus are unremarkable. Thyroid unremarkable. Lungs/Pleura: Partially loculated moderate-sized right pleural effusion noted. Compressive  atelectasis or pneumonia in the right lower lobe. Scattered granulomas in the right lung. Left lung clear. Upper Abdomen: Imaging into the upper abdomen shows no acute findings. Musculoskeletal: Chest wall soft tissues are unremarkable. No acute bony abnormality. IMPRESSION: Moderate partially loculated right pleural effusion. Right lower lobe compressive atelectasis versus pneumonia. Electronically Signed   By: Rolm Baptise M.D.   On: 06/29/2019 14:56   CT Chest W Contrast  Addendum Date: 07/18/2019   ADDENDUM REPORT: 07/18/2019 13:07 ADDENDUM: Enlarged and mildly heterogeneous LEFT hemi thyroid. Recommend thyroid ultrasound (ref: J Am Coll Radiol. 2015 Feb;12(2): 143-50). These results were called by telephone at the time of interpretation on 07/18/2019 at 1:07 pm to provider Duffy Bruce , who verbally acknowledged these results. Electronically Signed   By: Zetta Bills M.D.   On: 07/18/2019 13:07     Result Date: 07/18/2019 CLINICAL DATA:  Chest pain shortness of breath. EXAM: CT CHEST WITH CONTRAST TECHNIQUE: Multidetector CT imaging of the chest was performed during intravenous contrast administration. CONTRAST:  18mL OMNIPAQUE IOHEXOL 300 MG/ML  SOLN COMPARISON:  07/07/2019 FINDINGS: Cardiovascular: Heart size is normal without pericardial effusion. Calcified and noncalcified plaque throughout the thoracic aorta is similar to the prior study. Central pulmonary vasculature is unremarkable on venous phase imaging. Mediastinum/Nodes: Mildly heterogeneous enlargement of the LEFT hemi thyroid. No thoracic inlet adenopathy. No axillary lymphadenopathy. No hilar lymphadenopathy. Lungs/Pleura: Basilar consolidation on the RIGHT with small amount of pleural fluid, diminished since 07/07/2019. Post removal of RIGHT-sided chest tube. Small partially loculated area posterior to consolidated lung in the RIGHT chest measuring approximately 4.3 x 2.2 cm. Unchanged in this location when compared to the previous imaging study. Near complete resolution of pleural fluid elsewhere in the chest. Improved aeration overall since the study of 07/07/2019 in the RIGHT lower lobe. Area of consolidative change shows distortion of pulmonary vasculature and bronchovascular structures extending to the posterior RIGHT chest. Upper Abdomen: Incidental imaging of upper abdominal contents is unremarkable. Musculoskeletal: Spinal degenerative changes without acute or destructive bone process. IMPRESSION: 1. Basilar consolidation on the RIGHT with small amount of pleural fluid, diminished since 07/07/2019. Post removal of RIGHT-sided chest tube. 2. Small partially loculated area posterior to consolidated lung in the RIGHT chest measuring approximately 4.3 x 2.2 cm, unchanged in this location when compared to the previous imaging study. Compatible with persistent small volume empyema a a 3. Improved aeration at the RIGHT lung base still with  an area of consolidative change adjacent to small loculated collection in the posterior RIGHT chest. This may represent partially trapped lung or developing rounded atelectasis. Continued follow-up is suggested. 4. Aortic atherosclerosis. Aortic Atherosclerosis (ICD10-I70.0). Electronically Signed: By: Zetta Bills M.D. On: 07/18/2019 12:22   MR CERVICAL SPINE W WO CONTRAST  Result Date: 07/19/2019 CLINICAL DATA:  Left-sided neck pain EXAM: MRI CERVICAL SPINE WITHOUT AND WITH CONTRAST TECHNIQUE: Multiplanar and multiecho pulse sequences of the cervical spine, to include the craniocervical junction and cervicothoracic junction, were obtained without and with intravenous contrast. CONTRAST:  48mL GADAVIST GADOBUTROL 1 MMOL/ML IV SOLN COMPARISON:  None. FINDINGS: Alignment: Physiologic. Vertebrae: No fracture, evidence of discitis, or bone lesion. Cord: Normal signal and morphology. Posterior Fossa, vertebral arteries, paraspinal tissues: Negative. Disc levels: C1-2: Unremarkable. C2-3: Normal disc space and facet joints. There is no spinal canal stenosis. No neural foraminal stenosis. C3-4: Small disc bulge with bilateral uncovertebral hypertrophy. There is no spinal canal stenosis. Moderate right and severe left neural foraminal stenosis. C4-5: Small disc bulge with  right-greater-than-left facet hypertrophy and uncovertebral spurring. There is no spinal canal stenosis. Severe bilateral neural foraminal stenosis. C5-6: Disc space narrowing with intermediate disc osteophyte complex. Mild spinal canal stenosis. Mild right and severe left neural foraminal stenosis. C6-7: Small disc bulge. There is no spinal canal stenosis. No neural foraminal stenosis. C7-T1: Normal disc space and facet joints. There is no spinal canal stenosis. No neural foraminal stenosis. IMPRESSION: 1. Mild spinal canal stenosis and severe left neural foraminal stenosis at C5-6. 2. Severe bilateral C4-5 neural foraminal stenosis. 3. Moderate  right and severe left C3-4 neural foraminal stenosis. Electronically Signed   By: Ulyses Jarred M.D.   On: 07/19/2019 01:48   DG Chest Port 1 View  Result Date: 07/13/2019 CLINICAL DATA:  Empyema with chest tube present EXAM: PORTABLE CHEST 1 VIEW COMPARISON:  Jul 12, 2019 FINDINGS: Stable chest tube positioning on the right. No pneumothorax. Small right pleural effusion with right base atelectasis present. Lungs elsewhere clear. Heart size and pulmonary vascularity are normal. No adenopathy. There is degenerative change in the thoracic spine. IMPRESSION: Stable chest tube positioning without pneumothorax. Small right pleural effusion with right base atelectasis present. Lungs elsewhere clear. Stable cardiac silhouette. Electronically Signed   By: Lowella Grip III M.D.   On: 07/13/2019 09:31   DG Chest Port 1 View  Result Date: 07/11/2019 CLINICAL DATA:  Empyema. EXAM: PORTABLE CHEST 1 VIEW COMPARISON:  Yesterday FINDINGS: Additional right-sided pleural catheter placed in the interim. There is improved aeration at the right base with decreased pleural fluid. Normal heart size. No visible pneumothorax. IMPRESSION: Improved right base aeration after additional catheter placement. Electronically Signed   By: Monte Fantasia M.D.   On: 07/11/2019 08:56   DG Chest Port 1 View  Result Date: 07/10/2019 CLINICAL DATA:  Atelectasis EXAM: PORTABLE CHEST 1 VIEW COMPARISON:  Portable exam at 0743 hrs compared to 07/09/2019 FINDINGS: Upper-normal size of cardiac silhouette. Mediastinal contours and pulmonary vascularity normal. Persistent RIGHT pleural effusion and basilar atelectasis. LEFT lung clear. No acute infiltrate or pneumothorax. Endplate spur formation thoracic spine, mild. IMPRESSION: Persistent RIGHT pleural effusion and basilar atelectasis. Electronically Signed   By: Lavonia Dana M.D.   On: 07/10/2019 08:20   DG Chest Port 1 View  Result Date: 07/09/2019 CLINICAL DATA:  Empyema, RIGHT-sided  chest tube in place. EXAM: PORTABLE CHEST 1 VIEW COMPARISON:  Chest x-ray dated 07/07/2019. FINDINGS: RIGHT basilar chest tube appears stable in position. The overlying RIGHT pleural fluid is stable in extent. LEFT lung remains clear. No pneumothorax is seen. Heart size and mediastinal contours are stable. IMPRESSION: Stable chest x-ray. RIGHT-sided chest tube is stable in position. The overlying RIGHT pleural fluid is stable in extent. Electronically Signed   By: Franki Cabot M.D.   On: 07/09/2019 10:10   DG Chest Port 1 View  Result Date: 07/07/2019 CLINICAL DATA:  Follow-up right chest tube. EXAM: PORTABLE CHEST 1 VIEW COMPARISON:  07/06/2018 FINDINGS: Chest tube remains in place at the right lung base. There is slightly less pleural fluid than was seen yesterday. No pleural air is visible. Some pleural fluid does persist, with right lower lung atelectasis. Left hemithorax appears clear. IMPRESSION: Persistent right chest tube. Slightly less right pleural fluid and less pleural air. Moderate amount of pleural fluid does persist with lower right lung atelectasis. Electronically Signed   By: Nelson Chimes M.D.   On: 07/07/2019 11:55   DG Chest Port 1 View  Result Date: 07/06/2019 CLINICAL DATA:  Right empyema. EXAM:  PORTABLE CHEST 1 VIEW COMPARISON:  Chest x-ray dated Jul 04, 2019. FINDINGS: New right pigtail chest tube with unchanged small loculated pleural effusion. The left lung is clear. No pneumothorax. The heart size and mediastinal contours are within normal limits. Normal pulmonary vascularity. No acute osseous abnormality. IMPRESSION: 1. New right-sided chest tube with unchanged small loculated pleural effusion. No pneumothorax. Electronically Signed   By: Titus Dubin M.D.   On: 07/06/2019 09:17   DG Hand Complete Left  Result Date: 07/18/2019 CLINICAL DATA:  Left hand pain and swelling described over the index finger MCP joint. EXAM: LEFT HAND - COMPLETE 3+ VIEW COMPARISON:  None.  FINDINGS: No fracture or bone lesion. Mild asymmetric joint space narrowing of several of the interphalangeal joints. There is dorsal marginal spurring at the PIP joints of the second, third and fourth fingers. MCP joints are relatively well maintained. No erosions. Soft tissue prominence is noted adjacent to the spurring at the PIP joints of the second, third and fourth fingers, and along the proximal aspect of the index finger. IMPRESSION: 1. No fracture or dislocation. 2. Arthropathic changes involving the interphalangeal joints most evident at the PIP joints of the second, third and fourth fingers. Soft tissue swelling is most prominent at the base of the index finger. No joint erosions. Electronically Signed   By: Lajean Manes M.D.   On: 07/18/2019 10:26   CT IMAGE GUIDED DRAINAGE BY PERCUTANEOUS CATHETER  Result Date: 07/10/2019 INDICATION: 74 year old with a loculated right pleural effusion. Patient already has one right chest tube but there is residual right pleural fluid and request for a second chest tube placement. EXAM: CT-GUIDED RIGHT CHEST TUBE PLACEMENT MEDICATIONS: Moderate sedation ANESTHESIA/SEDATION: 1.0 mg IV Versed 50 mcg IV Fentanyl Moderate Sedation Time:  18 minutes The patient was continuously monitored during the procedure by the interventional radiology nurse under my direct supervision. COMPLICATIONS: None immediate. TECHNIQUE: Informed written consent was obtained from the patient after a thorough discussion of the procedural risks, benefits and alternatives. All questions were addressed. A timeout was performed prior to the initiation of the procedure. PROCEDURE: Patient was placed prone on the CT scanner. Pocket of fluid along the posterior aspect of the right chest was identified and targeted. The right posterior chest was prepped with chlorhexidine and sterile field was created. Skin and soft tissues were anesthetized with 1% lidocaine. Using CT guidance, a Yueh catheter was  directed into the pleural space and yellow fluid was aspirated. Superstiff Amplatz wire was advanced into the pleural space. The tract was dilated to accommodate a 14 [place] multipurpose drain. Small amount of additional pleural fluid was aspirated. Chest tube was attached to the drainage system. Catheter was sutured to skin and a dressing was placed. FINDINGS: Loculated right pleural fluid. Pocket along the right posterior aspect of the chest was identified and targeted for drain placement. 39 French drain was successfully placed in the right pleural space. Small amount of yellow fluid was removed. Pocket of pleural air noted following chest tube placement compatible with the loculated nature of the pleural effusion. IMPRESSION: CT-guided placement of a right chest tube. Right pleural fluid is loculated. Electronically Signed   By: Markus Daft M.D.   On: 07/10/2019 13:35   CT IMAGE GUIDED DRAINAGE BY PERCUTANEOUS CATHETER  Result Date: 07/05/2019 INDICATION: Right empyema EXAM: CT-GUIDED PLACEMENT RIGHT 10 FRENCH CHEST TUBE MEDICATIONS: The patient is currently admitted to the hospital and receiving intravenous antibiotics. The antibiotics were administered within an appropriate time frame  prior to the initiation of the procedure. ANESTHESIA/SEDATION: Fentanyl 50 mcg IV; Versed 1.0 mg IV Moderate Sedation Time:  11 MINUTES The patient was continuously monitored during the procedure by the interventional radiology nurse under my direct supervision. COMPLICATIONS: None immediate. PROCEDURE: Informed written consent was obtained from the patient after a thorough discussion of the procedural risks, benefits and alternatives. All questions were addressed. Maximal Sterile Barrier Technique was utilized including caps, mask, sterile gowns, sterile gloves, sterile drape, hand hygiene and skin antiseptic. A timeout was performed prior to the initiation of the procedure. Previous imaging reviewed. Patient positioned right  anterior oblique. Noncontrast localization CT performed. The loculated right effusion was localized and marked for a lateral posterior approach. Under sterile conditions and local anesthesia, 18 gauge introducer needle was advanced from a posterolateral lower intercostal approach into the pleural fluid. Needle position confirmed with CT. Guidewire inserted followed by tract dilatation insert a 10 French drain. Drain catheter position confirmed with CT. Syringe aspiration yielded 10 cc of thick mucinous exudative fluid. Sample sent for pan culture. Catheter secured with Prolene suture and connected to external pleura vac. Sterile dressing applied. No immediate complication. Patient tolerated the procedure well. IMPRESSION: Successful CT-guided 10 French right chest tube insertion. Electronically Signed   By: Jerilynn Mages.  Shick M.D.   On: 07/05/2019 15:44   US THORACENTESIS ASP PLEURAL SPACE W/IMG GUIDE  Result Date: 07/04/2019 CLINICAL DATA:  Loculated right pleural effusion. EXAM: ULTRASOUND GUIDED RIGHT THORACENTESIS COMPARISON:  CT of the chest on 06/29/2019 PROCEDURE: An ultrasound guided thoracentesis was thoroughly discussed with the patient and questions answered. The benefits, risks, alternatives and complications were also discussed. The patient understands and wishes to proceed with the procedure. Written consent was obtained. Ultrasound was performed to localize and mark an adequate pocket of fluid in the right chest. The area was then prepped and draped in the normal sterile fashion. 1% Lidocaine was used for local anesthesia. Under ultrasound guidance a 6 French Safe-T-Centesis catheter was introduced. Fluid was removed from the catheter. The catheter was then removed. Under ultrasound guidance, a 5 [place] Yueh centesis catheter was then introduced in a slightly higher pocket of pleural fluid in the right posterior chest. Aspiration was performed through the centesis catheter. The catheter was removed and a  dressing applied. FINDINGS: Ultrasound shows a very complex appearing and loculated right pleural effusion with relatively small components posteriorly. From the first pocket sampled in the right posterior pleural space towards the lung base, only 10 mL of clear appearing amber fluid was able to be aspirated. The sample was sent for various requested labs. Decision was made to sample a second slightly higher pocket yielding 5 mL of grossly purulent whitish green thick fluid. This sample was sent for culture analysis. IMPRESSION: Highly loculated and complex right pleural effusion with relatively small pockets posteriorly. The first pocket yielded 10 mL of clear appearing amber fluid. The second more superior pocket yielded 5 mL of grossly purulent fluid likely consistent with empyema. Fluid analysis will be performed on both samples. Electronically Signed   By: Aletta Edouard M.D.   On: 07/04/2019 14:24   DG Lumbar Puncture Fluoro Guide  Result Date: 07/19/2019 CLINICAL DATA:  Neck pain, possible meningitis EXAM: DIAGNOSTIC LUMBAR PUNCTURE UNDER FLUOROSCOPIC GUIDANCE FLUOROSCOPY TIME:  Fluoroscopy Time:  4 minutes 42 seconds Radiation Exposure Index (if provided by the fluoroscopic device): 150 mGy Number of Acquired Spot Images: 2 PROCEDURE: Informed consent was obtained from the patient prior to the procedure,  including potential complications of headache, allergy, and pain. With the patient prone, the lower back was prepped with Betadine. 1% Lidocaine was used for local anesthesia. Lumbar puncture was performed at the L3 level using a 20 gauge needle with return of clear CSF with an opening pressure of 15 cm water. Sixteen ml of CSF were obtained for laboratory studies. The patient tolerated the procedure well and there were no apparent complications. IMPRESSION: Successful fluoro guided lumbar puncture for fluid. Electronically Signed   By: Inez Catalina M.D.   On: 07/19/2019 12:34      ASSESSMENT/PLAN     Community acquired Pneumonia with empyema   s/p chest tube x2 with tPA  -present on admission - due to gram positive cocci such as streptococcus or staph aureus - patient has had numerous courses of antibiotics on outpatient basis- higher risk for resistant bacteria -s/p thoracentesis with - GPC + -procalcitonin- with mild elevation >.25 -MRSA nasal PCR  -bronchopulmonary hygiene with Acapella and IS x10/h -Recruitment maneuvers for atelecatatic right lung - metaneb q4h -continue PT -CXR 07/12/19- improved from previous   Empyema of right pleural space  - +GPCs -CT surgery on case Dr Genevive Bi -appreciate input -patient is being optimized for thoracic surgery    Joint swelling and pain   - patient may have had allergic reaction to augmentin on outpatient  - additional concern for possible autoimmune phenomenon post pneumonia with GPCs   - will obtain serology for autoimmune workup    -low dose solumedrol IV daily 40mg  and bendadryl q6h 25mg      Neck pain -resolved   - no grossly appreciable stiffness    - possible related to poor sleep architecture post hospital d/c   - additionally MRI neck with severe C4 foraminal stenosis   - some concern for potential CNS infection - will obtain CSF specimen to rule out meningitis   - empirically on cefepime / vancomycin   - CSF studies are abnormal - in context of recent infection with pneumonia and empyema - will ask neurology and ID to evaluate prior to patient having thoracic surgery   -discussed with neurosurgery - no additional intervention is required for severe spinal foraminal stenosis.  Patient may have surgery and no contraindications for general anesthesia/ RSI      Appreciate collaboration from all providers.    Thank you for allowing me to participate in the care of this patient.  .   Patient/Family are satisfied with care plan and all questions have been answered.    This document was prepared using Dragon voice  recognition software and may include unintentional dictation errors.     Ottie Glazier, M.D.  [facility]

## 2019-07-20 NOTE — Progress Notes (Signed)
Andrew Myers Follow Up Note  Patient ID: Andrew Myers, male   DOB: 01-02-1946, 74 y.o.   MRN: [id number]  HISTORY: Overall he feels much better.  No neck stiffness or pain.  Hands much improved.  No shortness of breath.  No fever.    Vitals:   07/20/19 0407 07/20/19 1209  BP: 126/68 139/70  Pulse: 68 75  Resp: 20 18  Temp: (!) 97.4 F (36.3 C) 97.6 F (36.4 C)  SpO2: 97% 96%     EXAM:  Resp: Lungs are clear bilaterally.  No respiratory distress, normal effort. Heart:  Irregular with soft systolimc murmur.  ? PVC? Abd:  Abdomen is soft, non distended and non tender. No masses are palpable.  There is no rebound and no guarding.  Neurological: Alert and oriented to person, place, and time. Coordination normal.  Skin: Skin is warm and dry. No rash noted. No diaphoretic. No erythema. No pallor.  Psychiatric: Normal mood and affect. Normal behavior. Judgment and thought content normal.      ASSESSMENT/PLAN  Overall he seems to have been much improved.  Cultures pending on some studies.  Irregular heart beat this afternoon.  Will add cardiac monitoring.     Andrew Lewandowsky, MD

## 2019-07-20 NOTE — Progress Notes (Signed)
Patient complains of swelling in right arm, upon exam IV infiltrated. IV removed, arm elevated, will continue to monitor.

## 2019-07-20 NOTE — Progress Notes (Signed)
ID Doing better No fever Says hip /knee pain has almost resolved Residual wrist and shoulder pain Neck pain resolved  Patient Vitals for the past 24 hrs:  BP Temp Temp src Pulse Resp SpO2  07/20/19 1209 139/70 97.6 F (36.4 C) Oral 75 18 96 %  07/20/19 0407 126/68 (!) 97.4 F (36.3 C) Oral 68 20 97 %  07/20/19 0139 138/68 97.7 F (36.5 C) Oral 77 16 95 %  07/19/19 2036 (!) 156/86 97.9 F (36.6 C) Oral (!) 56 -- 96 %   O/e awake and alert Swelling MCP joints resolved Chest b/l air entry- decreased rt base Hss1s abd soft  CBC Latest Ref Rng & Units 07/19/2019 07/19/2019 07/18/2019  WBC 4.0 - 10.5 K/uL 13.9(H) 14.9(H) 9.4  Hemoglobin 13.0 - 17.0 g/dL 12.4(L) 12.1(L) 12.4(L)  Hematocrit 39.0 - 52.0 % 36.8(L) 36.8(L) 37.1(L)  Platelets 150 - 400 K/uL 278 261 279     CMP Latest Ref Rng & Units 07/19/2019 07/19/2019 07/18/2019  Glucose 70 - 99 mg/dL 161(H) 161(H) 152(H)  BUN 8 - 23 mg/dL '10 10 13  ' Creatinine 0.61 - 1.24 mg/dL 0.60(L) 0.69 0.62  Sodium 135 - 145 mmol/L 131(L) 130(L) 134(L)  Potassium 3.5 - 5.1 mmol/L 3.5 3.3(L) 3.3(L)  Chloride 98 - 111 mmol/L 96(L) 96(L) 98  CO2 22 - 32 mmol/L 24 21(L) 24  Calcium 8.9 - 10.3 mg/dL 8.6(L) 8.7(L) 9.2  Total Protein 6.5 - 8.1 g/dL 7.6 - 8.1  Total Bilirubin 0.3 - 1.2 mg/dL 1.2 - 1.0  Alkaline Phos 38 - 126 U/L 47 - 51  AST 15 - 41 U/L 18 - 28  ALT 0 - 44 U/L 23 - 33    Impression/recommendation  Impression/Recommendation ?74 year old male who was recently in the hospital for right sided empyema and was treated with chest drains, TPA and IV antibiotics and was sent home on p.o. Augmentin on 07/14/2019 presents to the hospital on 07/18/2019 with fever and polyarthralgia.  Fever and polyarthritis Polyarthritis  symmetrical involving the wrists, MCP knees and hips. Polyarthralgia elbows and shoulders Fever present since the day he was discharged from the hospital.  High ESR  The differential diagnosis is  serum sickness due to  amoxicillin  Viral illness Autoimmune illness Unlikely Lyme or Ehrlichia Gonococcal polyarthritis is unlikely On methylprednisone and much improved c3  level high, c4 normal Recommend rheumatology consult.  ? __Lymphocytic pleocytosis and CSF.  WBC was 10 but normal protein and normal glucose does not know the significance of this.  This is not an infection.   aseptic meningitis-like picture but could be from the serum sickness as well.   Recent right empyema status post chest  drains and antibiotics.  Small fluid collection persist 4 cm x 4 cm.  We will hold off on further surgical intervention until we know exactly what is going on with him.  _________________________________________________ Discussed with patient in great detail.  Note: This document was prepared using Dragon voice recognition software and may include unintentional dictation errors.

## 2019-07-21 LAB — PROCALCITONIN: Procalcitonin: 0.16 ng/mL

## 2019-07-21 LAB — GLUCOSE, CAPILLARY
Glucose-Capillary: 138 mg/dL — ABNORMAL HIGH (ref 70–99)
Glucose-Capillary: 228 mg/dL — ABNORMAL HIGH (ref 70–99)

## 2019-07-21 LAB — MISC LABCORP TEST (SEND OUT): Labcorp test code: 258004

## 2019-07-21 LAB — OLIGOCLONAL BANDS, CSF + SERM

## 2019-07-21 LAB — RPR: RPR Ser Ql: NONREACTIVE

## 2019-07-21 LAB — CYTOLOGY - NON PAP

## 2019-07-21 MED ORDER — POLYETHYLENE GLYCOL 3350 17 G PO PACK
17.0000 g | PACK | Freq: Every day | ORAL | Status: DC
  Administered 2019-07-21 – 2019-07-22 (×2): 17 g via ORAL
  Filled 2019-07-21 (×5): qty 1

## 2019-07-21 MED ORDER — SODIUM CHLORIDE 0.9 % IV SOLN
INTRAVENOUS | Status: DC | PRN
  Administered 2019-07-21: 250 mL via INTRAVENOUS

## 2019-07-21 MED ORDER — METRONIDAZOLE 500 MG PO TABS
500.0000 mg | ORAL_TABLET | Freq: Three times a day (TID) | ORAL | Status: DC
  Administered 2019-07-21 – 2019-07-24 (×9): 500 mg via ORAL
  Filled 2019-07-21 (×10): qty 1

## 2019-07-21 MED ORDER — CEPHALEXIN 500 MG PO CAPS
500.0000 mg | ORAL_CAPSULE | Freq: Four times a day (QID) | ORAL | Status: DC
  Administered 2019-07-21 – 2019-07-24 (×9): 500 mg via ORAL
  Filled 2019-07-21 (×9): qty 1

## 2019-07-21 NOTE — Progress Notes (Signed)
Id Pt doing much better Pain in joints have resolved except for left hand No fever  Patient Vitals for the past 24 hrs:  BP Temp Temp src Pulse Resp SpO2  07/21/19 1153 (!) 149/86 97.7 F (36.5 C) Oral 75 16 96 %  07/21/19 0510 127/72 (!) 97.5 F (36.4 C) Oral 69 18 96 %  07/20/19 2022 (!) 154/86 97.6 F (36.4 C) Oral 79 20 98 %    Chest B/l air entry Decreased rt base Hs s1s2 Abd sot No swelling of wrist joints/knee  Labs CBC Latest Ref Rng & Units 07/19/2019 07/19/2019 07/18/2019  WBC 4.0 - 10.5 K/uL 13.9(H) 14.9(H) 9.4  Hemoglobin 13.0 - 17.0 g/dL 12.4(L) 12.1(L) 12.4(L)  Hematocrit 39.0 - 52.0 % 36.8(L) 36.8(L) 37.1(L)  Platelets 150 - 400 K/uL 278 261 279    CMP Latest Ref Rng & Units 07/19/2019 07/19/2019 07/18/2019  Glucose 70 - 99 mg/dL 161(H) 161(H) 152(H)  BUN 8 - 23 mg/dL '10 10 13  ' Creatinine 0.61 - 1.24 mg/dL 0.60(L) 0.69 0.62  Sodium 135 - 145 mmol/L 131(L) 130(L) 134(L)  Potassium 3.5 - 5.1 mmol/L 3.5 3.3(L) 3.3(L)  Chloride 98 - 111 mmol/L 96(L) 96(L) 98  CO2 22 - 32 mmol/L 24 21(L) 24  Calcium 8.9 - 10.3 mg/dL 8.6(L) 8.7(L) 9.2  Total Protein 6.5 - 8.1 g/dL 7.6 - 8.1  Total Bilirubin 0.3 - 1.2 mg/dL 1.2 - 1.0  Alkaline Phos 38 - 126 U/L 47 - 51  AST 15 - 41 U/L 18 - 28  ALT 0 - 44 U/L 23 - 33   Procalcitonin 0.16 RPR nonreactive GC chlamydia in urine negative Lyme antibody IgM/IgG negative Human parvovirus pending Ehrlichia PCR pending CK normal Hepatitis panel negative C3 complement elevated at 201 C4 normal at 36 Rheumatoid factor mildly elevated at 17 ANA negative ASO within range at 151.  Impression/recommendation 74 year old male who was recently In the hospital for right-sided empyema and was treated with chest drains, TPN IV antibiotics and was sent home on p.o. Augmentin on 07/14/2019 presents to the hospital on 07/18/2019 with fever and polyarthralgia.  Fever and polyarthritis Polyarthritis is symmetrical involving the wrists, MCP joints,  knees and hips.  There is polyarthralgia of elbows and shoulders.  Fever present since the day he was discharged from the hospital.  High ESR.  The differential diagnosis is Serum sickness due to amoxicillin Viral illness Autoimmune illness Improved with 1 dose of methylprednisone Appreciate Dr. Scharlene Gloss recommendation.  He asked to the patient to be sent on a prednisone taper.  Lymphocytic pleocytosis in the CSF.  WBC was 10 but normal protein and normal glucose and does not fit any criteria for infection.  Recent right empyema status post chest drains and antibiotics.  The recent chest x-ray shows a persistent 4 cm collection.  He is currently on cefepime.  We will change it into Keflex 500 mg every 6+ Flagyl 500 mg every 8 for another 7 to 10 days. Discussed the management with the patient.  ID will sign off call if needed.

## 2019-07-21 NOTE — Consult Note (Signed)
Reason for Consult: Inflammatory arthritis  Referring Physician: Hospitalist  Andrew Myers   HPI: 74 year old white male.  Previously worked at [employer]  History of hypertension. Developed cough at the end of February and 1 March.  Had chest x-ray with right-sided infiltrate.  Had antibiotic and course of prednisone.  Chest CT showed empyema and pleural effusion.  He had tapped x2 with initially some large fluid and then purulent material.  Had 2 chest tubes was admitted was placed on antibiotics.  Gram-positive cocci on Gram stain though never had positive culture.  Was discharged and 3 days later developed pain in his knee and fever and chill.  The following day he had neck pain could hardly turn his neck from side to side.  He then had swelling in the left hand which had erythema and itching.  Joints progressed to pain in both hips both shoulders swelling in the right wrist.  Was admitted.  MRI of the cervical spine did not show abscess.  He had an LP with a few white cells culture negative. Labs pertinent for white count 13,000.  Normal platelets.  CA CT of the chest showed consult consolidation of the right chest small pleural effusion.  Sed rate 115 uric acid 3 mildly low C3 but normal C4.  CRP 24 rheumatoid factor 17 ANA negative He received 1 dose of Solu-Medrol 20 with resolution of his joint pain.  Has not had any recurrent fever.  Now ambulating.  May be having a thoracic surgery to remove his persistent consolidated area if unimproved  PMH: Hypertension.  Osteoarthritis  SURGICAL HISTORY: No joint surgery  Family History: Negative for rheumatic disease  Social History: Remote cigarettes  Allergies:  Allergies  Allergen Reactions  . Codeine Rash    Medications:  Scheduled: . amLODipine  5 mg Oral Daily  . aspirin EC  81 mg Oral BID  . atorvastatin  20 mg Oral QHS  . irbesartan  150 mg Oral Daily  . loratadine  10 mg Oral Daily  . methylPREDNISolone (SOLU-MEDROL)  injection  20 mg Intravenous Daily  . multivitamin with minerals  1 tablet Oral Daily  . nebivolol  5 mg Oral Daily  . polyethylene glycol  17 g Oral Daily  . tamsulosin  0.8 mg Oral QPC supper        ROS: No abdominal pain.  No shortness of breath.  No history of podagra.  No kidney stone.  No numbness or tingling.   PHYSICAL EXAM: Blood pressure (!) 149/86, pulse 75, temperature (!) 97.5 F (36.4 C), temperature source Oral, resp. rate 16, height 5\' 11"  (1.803 m), weight 96.2 kg, SpO2 96 %. Pleasant male.  Sclera clear.  Distant lung sounds on the right.  No visceromegaly.  No rash.  No psoriatic patches.  No significant edema Musculoskeletal: Good range of motion cervical spine and shoulders.  Elbows without nodules.  Wrists MCPs PIPs without synovitis.  Hips move well.  No knee effusions.  Ankles move well.  MTPs nontender  Assessment: Acute inflammatory arthritis following hospitalization for empyema and antibiotics -Resolution with single dose of steroid -Fever, resolved -No x-ray evidence of CPPD on hand film -Low titer rheumatoid factor without historical or exam evidence of chronic rheumatoid -Normal urine and liver functions -Itching of the left hand with initial presentation Would agree that this may be a serum sickness-like syndrome.  Less likely CPPD.  Less likely sudden onset of rheumatoid arthritis.  Could have been viral but now resolved  Recent empyema Persistent right lung consolidation unclear etiology  Recommendations: 5 or 6-day course of prednisone taper.  If he has recurrent joint pain after discharge I will be happy to see him in the office to reassess  Emmaline Kluver 07/21/2019, 11:55 AM

## 2019-07-21 NOTE — Care Management Important Message (Signed)
Important Message  Patient Details  Name: Andrew Myers MRN: [id number] Date of Birth: May 13, 1945   Medicare Important Message Given:  Yes     Dannette Barbara 07/21/2019, 12:26 PM

## 2019-07-21 NOTE — Progress Notes (Signed)
Pulmonary Medicine          Date: 07/21/2019,   MRN# [id number] [place] [birth year]     AdmissionWeight: 96.2 kg                 CurrentWeight: 96.2 kg   Refering physician:Dr Agbata   CHIEF COMPLAINT:   Empyema s/p intrapleural drain with recurrence of symptoms   SUBJECTIVE   -Patient is sitting up in bed in no distress.   -patient was able to do 12 laps around hallway  -Incentive spirometer- he is up to maximum tidal volumes  Able to breathe 2150cc per breath.   -Discussed case with multiple subspecialists appreciate all involved.    PAST MEDICAL HISTORY   Past Medical History:  Diagnosis Date  . BPH (benign prostatic hyperplasia)   . CAP (community acquired pneumonia) 04/2019  . Empyema lung ([place]) 06/2019  . Hyperlipidemia   . Hypertension   . Obesity   . Pleural effusion on right 05/2019  . Skin cancer    BCC, SCC  . Sleep apnea   . Sleep apnea      SURGICAL HISTORY   Past Surgical History:  Procedure Laterality Date  . APPENDECTOMY    . CATARACT EXTRACTION    . FRACTURE SURGERY       FAMILY HISTORY   Family History  Problem Relation Age of Onset  . Breast cancer Mother   . Hypertension Mother   . Hyperlipidemia Mother   . Heart failure Mother   . Heart attack Father   . Thyroid cancer Sister      SOCIAL HISTORY   Social History   Tobacco Use  . Smoking status: Former Smoker    Quit date: 05/07/1967    Years since quitting: 52.2  . Smokeless tobacco: Never Used  Substance Use Topics  . Alcohol use: Yes    Comment: rarely  . Drug use: No     MEDICATIONS    Home Medication:    Current Medication:  Current Facility-Administered Medications:  .  0.9 %  sodium chloride infusion, , Intravenous, PRN, Lorella Nimrod, MD, Last Rate: 10 mL/hr at 07/21/19 1329, 250 mL at 07/21/19 1329 .  acetaminophen (TYLENOL) tablet 650 mg, 650 mg, Oral, Q6H PRN, Ivor Costa, MD, 650 mg at 07/18/19 2036 .  amLODipine  (NORVASC) tablet 5 mg, 5 mg, Oral, Daily, Ivor Costa, MD, 5 mg at 07/21/19 0902 .  aspirin EC tablet 81 mg, 81 mg, Oral, BID, Ivor Costa, MD, 81 mg at 07/21/19 0902 .  atorvastatin (LIPITOR) tablet 20 mg, 20 mg, Oral, QHS, Ivor Costa, MD, 20 mg at 07/20/19 2123 .  cephALEXin (KEFLEX) capsule 500 mg, 500 mg, Oral, Q6H, Ravishankar, Jayashree, MD .  hydrALAZINE (APRESOLINE) injection 5 mg, 5 mg, Intravenous, Q2H PRN, Ivor Costa, MD .  hydrOXYzine (VISTARIL) injection 25 mg, 25 mg, Intramuscular, Q6H PRN, Ivor Costa, MD .  irbesartan (AVAPRO) tablet 150 mg, 150 mg, Oral, Daily, Ivor Costa, MD, 150 mg at 07/21/19 0903 .  loratadine (CLARITIN) tablet 10 mg, 10 mg, Oral, Daily, Ivor Costa, MD, 10 mg at 07/21/19 0901 .  methylPREDNISolone sodium succinate (SOLU-MEDROL) 40 mg/mL injection 20 mg, 20 mg, Intravenous, Daily, Lanney Gins, Jaycey Gens, MD, 20 mg at 07/21/19 0902 .  metroNIDAZOLE (FLAGYL) tablet 500 mg, 500 mg, Oral, Q8H, Ravishankar, Jayashree, MD .  multivitamin with minerals tablet 1 tablet, 1 tablet, Oral, Daily, Ivor Costa, MD, 1 tablet at 07/21/19 0901 .  nebivolol (BYSTOLIC)  tablet 5 mg, 5 mg, Oral, Daily, Ivor Costa, MD, 5 mg at 07/21/19 0946 .  oxyCODONE-acetaminophen (PERCOCET/ROXICET) 5-325 MG per tablet 1 tablet, 1 tablet, Oral, Q4H PRN, Ivor Costa, MD, 1 tablet at 07/19/19 2319 .  polyethylene glycol (MIRALAX / GLYCOLAX) packet 17 g, 17 g, Oral, Daily, Lorella Nimrod, MD, 17 g at 07/21/19 0903 .  tamsulosin (FLOMAX) capsule 0.8 mg, 0.8 mg, Oral, QPC supper, Ivor Costa, MD, 0.8 mg at 07/21/19 1743    ALLERGIES   Codeine     REVIEW OF SYSTEMS    Review of Systems:  Gen:  Denies  fever, sweats, chills weigh loss  HEENT: Denies blurred vision, double vision, ear pain, eye pain, hearing loss, nose bleeds, sore throat+neck pain  Cardiac:  No dizziness, chest pain or heaviness, chest tightness,edema Resp:   Denies cough or sputum porduction, shortness of breath,wheezing, hemoptysis,    Gi: Denies swallowing difficulty, stomach pain, nausea or vomiting, diarrhea, constipation, bowel incontinence Gu:  Denies bladder incontinence, burning urine Ext:   Admits to joint pain upper extermiteis including elbows, shoulders and MCP Skin: +erythema over joints Endoc:  Denies polyuria, polydipsia , polyphagia or weight change Psych:   Denies depression, insomnia or hallucinations   Other:  All other systems negative   VS: BP 133/79 (BP Location: Right Arm)   Pulse 72   Temp (!) 97.4 F (36.3 C) (Oral)   Resp 16   Ht 5\' 11"  (1.803 m)   Wt 96.2 kg   SpO2 99%   BMI 29.57 kg/m      PHYSICAL EXAM    GENERAL:NAD, no fevers, chills, no weakness no fatigue HEAD: Normocephalic, atraumatic.  EYES: Pupils equal, round, reactive to light. Extraocular muscles intact. No scleral icterus.  MOUTH: Moist mucosal membrane. Dentition intact. No abscess noted.  EAR, NOSE, THROAT: Clear without exudates. No external lesions.  NECK: Supple. No thyromegaly. No nodules. No JVD.  PULMONARY: Clear to ausculatation bilaterally with mild decrement on right side CARDIOVASCULAR: S1 and S2. Regular rate and rhythm. No murmurs, rubs, or gallops. No edema. Pedal pulses 2+ bilaterally.  GASTROINTESTINAL: Soft, nontender, nondistended. No masses. Positive bowel sounds. No hepatosplenomegaly.  MUSCULOSKELETAL: No swelling, clubbing, or edema. Range of motion full in all extremities.  NEUROLOGIC: Cranial nerves II through XII are intact. No gross focal neurological deficits. Sensation intact. Reflexes intact.  SKIN: No ulceration, lesions, rashes, or cyanosis. Skin warm and dry. Turgor intact.  PSYCHIATRIC: Mood, affect within normal limits. The patient is awake, alert and oriented x 3. Insight, judgment intact.       IMAGING    DG Chest 1 View  Result Date: 07/04/2019 CLINICAL DATA:  Status post right thoracentesis. EXAM: CHEST  1 VIEW COMPARISON:  CT of the chest on 06/29/2019 FINDINGS: The  heart size and mediastinal contours are within normal limits. Residual lateral/posterolateral component of loculated pleural fluid remains on the right. No pneumothorax after right thoracentesis. No pulmonary edema. The visualized skeletal structures are unremarkable. IMPRESSION: No pneumothorax after right sided thoracentesis. Residual lateral/posterolateral component of loculated right pleural fluid remains. Electronically Signed   By: Aletta Edouard M.D.   On: 07/04/2019 14:26   DG Chest 2 View  Result Date: 07/18/2019 CLINICAL DATA:  Fever.  Chest tube removal EXAM: CHEST - 2 VIEW COMPARISON:  Jul 13, 2019 FINDINGS: Chest tube has been removed. No appreciable pneumothorax. There is a small right pleural effusion with ill-defined airspace opacity in the right mid and lower lung zones. Left lung  clear. Heart size and pulmonary vascularity are normal. No adenopathy. There is mild degenerative change in the thoracic spine. IMPRESSION: No evident pneumothorax. Small right pleural effusion with ill-defined opacity in the right mid and lower lung zones, likely combination of loculated pleural effusion and atelectasis. Mild pneumonia cannot be excluded in this area. Lungs elsewhere clear. Cardiac silhouette within normal limits. Electronically Signed   By: Lowella Grip III M.D.   On: 07/18/2019 10:57   DG Chest 2 View  Result Date: 07/12/2019 CLINICAL DATA:  Empyema.  Chest tube present. EXAM: CHEST - 2 VIEW COMPARISON:  07/11/2019 FINDINGS: Right basilar chest tube unchanged. Lungs are somewhat hypoinflated with evidence of a small amount right pleural fluid without significant change. No pneumothorax. Remainder of the lungs are clear. Cardiomediastinal silhouette and remainder of the exam is unchanged. IMPRESSION: Small amount right pleural fluid with right basilar chest tube unchanged. Electronically Signed   By: Marin Olp M.D.   On: 07/12/2019 08:25   CT CHEST WO CONTRAST  Result Date:  07/07/2019 CLINICAL DATA:  Follow-up scan for empyema of the RIGHT lung, chest tube placed 2 days ago EXAM: CT CHEST WITHOUT CONTRAST TECHNIQUE: Multidetector CT imaging of the chest was performed following the standard protocol without IV contrast. COMPARISON:  06/28/2009 and intervening interventional and ultrasound evaluations. FINDINGS: Cardiovascular: Calcified atheromatous plaque in the thoracic aorta similar to prior study. No aneurysmal dilation. Heart size is stable without pericardial effusion. Calcified coronary artery disease as before. Vessels not well assessed given lack of intravenous contrast. Mediastinum/Nodes: No thoracic inlet adenopathy. No axillary lymphadenopathy. No mediastinal adenopathy. Esophagus grossly normal. Lungs/Pleura: Since the previous examination there is been interval placement of a RIGHT-sided chest tube. A small amount of subcutaneous emphysema is noted. The marker for chest tube sideholes is at least 1.5 cm within the RIGHT chest cavity within the pleural space. Pleural fluid at the RIGHT lung base is perhaps slightly diminished but there is no change in the loculated fluid along the fissure in the RIGHT chest when compared to the prior exam RIGHT basilar consolidative changes and volume loss are similar to the prior study. LEFT chest is clear. Airways are patent. Upper Abdomen: Upper abdominal contents are unremarkable. No acute process in the upper abdomen. Musculoskeletal: Small amount of subcutaneous emphysema along the RIGHT chest wall as discussed. No chest wall mass. No acute bone process. Spinal degenerative change. IMPRESSION: 1. Slightly diminished RIGHT pleural fluid, dominant loculated area along the major fissure in the RIGHT chest is unchanged. 2. Persistent basilar consolidation/volume loss without change. This may represent rounded atelectasis or pneumonia. Follow-up is suggested to ensure there is no underlying mass. 3. Small amount of subcutaneous emphysema  along the chest wall. Marker for sideholes is within the pleural space as discussed above. 4. Atherosclerotic changes in the thoracic aorta. Calcified coronary artery disease. 5. Aortic atherosclerosis. Aortic Atherosclerosis (ICD10-I70.0). Electronically Signed   By: Zetta Bills M.D.   On: 07/07/2019 16:33   CT CHEST WO CONTRAST  Result Date: 06/29/2019 CLINICAL DATA:  Pleural effusion, shortness of breath. Follow-up chest x-ray EXAM: CT CHEST WITHOUT CONTRAST TECHNIQUE: Multidetector CT imaging of the chest was performed following the standard protocol without IV contrast. COMPARISON:  Chest x-ray 05/11/2019 FINDINGS: Cardiovascular: Heart is normal size. Aorta normal caliber. Scattered coronary artery and aortic calcifications. Mediastinum/Nodes: No mediastinal, hilar, or axillary adenopathy. Trachea and esophagus are unremarkable. Thyroid unremarkable. Lungs/Pleura: Partially loculated moderate-sized right pleural effusion noted. Compressive atelectasis or pneumonia in the right  lower lobe. Scattered granulomas in the right lung. Left lung clear. Upper Abdomen: Imaging into the upper abdomen shows no acute findings. Musculoskeletal: Chest wall soft tissues are unremarkable. No acute bony abnormality. IMPRESSION: Moderate partially loculated right pleural effusion. Right lower lobe compressive atelectasis versus pneumonia. Electronically Signed   By: Rolm Baptise M.D.   On: 06/29/2019 14:56   CT Chest W Contrast  Addendum Date: 07/18/2019   ADDENDUM REPORT: 07/18/2019 13:07 ADDENDUM: Enlarged and mildly heterogeneous LEFT hemi thyroid. Recommend thyroid ultrasound (ref: J Am Coll Radiol. 2015 Feb;12(2): 143-50). These results were called by telephone at the time of interpretation on 07/18/2019 at 1:07 pm to provider Duffy Bruce , who verbally acknowledged these results. Electronically Signed   By: Zetta Bills M.D.   On: 07/18/2019 13:07   Result Date: 07/18/2019 CLINICAL DATA:  Chest pain  shortness of breath. EXAM: CT CHEST WITH CONTRAST TECHNIQUE: Multidetector CT imaging of the chest was performed during intravenous contrast administration. CONTRAST:  68mL OMNIPAQUE IOHEXOL 300 MG/ML  SOLN COMPARISON:  07/07/2019 FINDINGS: Cardiovascular: Heart size is normal without pericardial effusion. Calcified and noncalcified plaque throughout the thoracic aorta is similar to the prior study. Central pulmonary vasculature is unremarkable on venous phase imaging. Mediastinum/Nodes: Mildly heterogeneous enlargement of the LEFT hemi thyroid. No thoracic inlet adenopathy. No axillary lymphadenopathy. No hilar lymphadenopathy. Lungs/Pleura: Basilar consolidation on the RIGHT with small amount of pleural fluid, diminished since 07/07/2019. Post removal of RIGHT-sided chest tube. Small partially loculated area posterior to consolidated lung in the RIGHT chest measuring approximately 4.3 x 2.2 cm. Unchanged in this location when compared to the previous imaging study. Near complete resolution of pleural fluid elsewhere in the chest. Improved aeration overall since the study of 07/07/2019 in the RIGHT lower lobe. Area of consolidative change shows distortion of pulmonary vasculature and bronchovascular structures extending to the posterior RIGHT chest. Upper Abdomen: Incidental imaging of upper abdominal contents is unremarkable. Musculoskeletal: Spinal degenerative changes without acute or destructive bone process. IMPRESSION: 1. Basilar consolidation on the RIGHT with small amount of pleural fluid, diminished since 07/07/2019. Post removal of RIGHT-sided chest tube. 2. Small partially loculated area posterior to consolidated lung in the RIGHT chest measuring approximately 4.3 x 2.2 cm, unchanged in this location when compared to the previous imaging study. Compatible with persistent small volume empyema a a 3. Improved aeration at the RIGHT lung base still with an area of consolidative change adjacent to small  loculated collection in the posterior RIGHT chest. This may represent partially trapped lung or developing rounded atelectasis. Continued follow-up is suggested. 4. Aortic atherosclerosis. Aortic Atherosclerosis (ICD10-I70.0). Electronically Signed: By: Zetta Bills M.D. On: 07/18/2019 12:22   MR CERVICAL SPINE W WO CONTRAST  Result Date: 07/19/2019 CLINICAL DATA:  Left-sided neck pain EXAM: MRI CERVICAL SPINE WITHOUT AND WITH CONTRAST TECHNIQUE: Multiplanar and multiecho pulse sequences of the cervical spine, to include the craniocervical junction and cervicothoracic junction, were obtained without and with intravenous contrast. CONTRAST:  28mL GADAVIST GADOBUTROL 1 MMOL/ML IV SOLN COMPARISON:  None. FINDINGS: Alignment: Physiologic. Vertebrae: No fracture, evidence of discitis, or bone lesion. Cord: Normal signal and morphology. Posterior Fossa, vertebral arteries, paraspinal tissues: Negative. Disc levels: C1-2: Unremarkable. C2-3: Normal disc space and facet joints. There is no spinal canal stenosis. No neural foraminal stenosis. C3-4: Small disc bulge with bilateral uncovertebral hypertrophy. There is no spinal canal stenosis. Moderate right and severe left neural foraminal stenosis. C4-5: Small disc bulge with right-greater-than-left facet hypertrophy and uncovertebral spurring. There  is no spinal canal stenosis. Severe bilateral neural foraminal stenosis. C5-6: Disc space narrowing with intermediate disc osteophyte complex. Mild spinal canal stenosis. Mild right and severe left neural foraminal stenosis. C6-7: Small disc bulge. There is no spinal canal stenosis. No neural foraminal stenosis. C7-T1: Normal disc space and facet joints. There is no spinal canal stenosis. No neural foraminal stenosis. IMPRESSION: 1. Mild spinal canal stenosis and severe left neural foraminal stenosis at C5-6. 2. Severe bilateral C4-5 neural foraminal stenosis. 3. Moderate right and severe left C3-4 neural foraminal stenosis.  Electronically Signed   By: Ulyses Jarred M.D.   On: 07/19/2019 01:48   DG Chest Port 1 View  Result Date: 07/13/2019 CLINICAL DATA:  Empyema with chest tube present EXAM: PORTABLE CHEST 1 VIEW COMPARISON:  Jul 12, 2019 FINDINGS: Stable chest tube positioning on the right. No pneumothorax. Small right pleural effusion with right base atelectasis present. Lungs elsewhere clear. Heart size and pulmonary vascularity are normal. No adenopathy. There is degenerative change in the thoracic spine. IMPRESSION: Stable chest tube positioning without pneumothorax. Small right pleural effusion with right base atelectasis present. Lungs elsewhere clear. Stable cardiac silhouette. Electronically Signed   By: Lowella Grip III M.D.   On: 07/13/2019 09:31   DG Chest Port 1 View  Result Date: 07/11/2019 CLINICAL DATA:  Empyema. EXAM: PORTABLE CHEST 1 VIEW COMPARISON:  Yesterday FINDINGS: Additional right-sided pleural catheter placed in the interim. There is improved aeration at the right base with decreased pleural fluid. Normal heart size. No visible pneumothorax. IMPRESSION: Improved right base aeration after additional catheter placement. Electronically Signed   By: Monte Fantasia M.D.   On: 07/11/2019 08:56   DG Chest Port 1 View  Result Date: 07/10/2019 CLINICAL DATA:  Atelectasis EXAM: PORTABLE CHEST 1 VIEW COMPARISON:  Portable exam at 0743 hrs compared to 07/09/2019 FINDINGS: Upper-normal size of cardiac silhouette. Mediastinal contours and pulmonary vascularity normal. Persistent RIGHT pleural effusion and basilar atelectasis. LEFT lung clear. No acute infiltrate or pneumothorax. Endplate spur formation thoracic spine, mild. IMPRESSION: Persistent RIGHT pleural effusion and basilar atelectasis. Electronically Signed   By: Lavonia Dana M.D.   On: 07/10/2019 08:20   DG Chest Port 1 View  Result Date: 07/09/2019 CLINICAL DATA:  Empyema, RIGHT-sided chest tube in place. EXAM: PORTABLE CHEST 1 VIEW  COMPARISON:  Chest x-ray dated 07/07/2019. FINDINGS: RIGHT basilar chest tube appears stable in position. The overlying RIGHT pleural fluid is stable in extent. LEFT lung remains clear. No pneumothorax is seen. Heart size and mediastinal contours are stable. IMPRESSION: Stable chest x-ray. RIGHT-sided chest tube is stable in position. The overlying RIGHT pleural fluid is stable in extent. Electronically Signed   By: Franki Cabot M.D.   On: 07/09/2019 10:10   DG Chest Port 1 View  Result Date: 07/07/2019 CLINICAL DATA:  Follow-up right chest tube. EXAM: PORTABLE CHEST 1 VIEW COMPARISON:  07/06/2018 FINDINGS: Chest tube remains in place at the right lung base. There is slightly less pleural fluid than was seen yesterday. No pleural air is visible. Some pleural fluid does persist, with right lower lung atelectasis. Left hemithorax appears clear. IMPRESSION: Persistent right chest tube. Slightly less right pleural fluid and less pleural air. Moderate amount of pleural fluid does persist with lower right lung atelectasis. Electronically Signed   By: Nelson Chimes M.D.   On: 07/07/2019 11:55   DG Chest Port 1 View  Result Date: 07/06/2019 CLINICAL DATA:  Right empyema. EXAM: PORTABLE CHEST 1 VIEW COMPARISON:  Chest  x-ray dated Jul 04, 2019. FINDINGS: New right pigtail chest tube with unchanged small loculated pleural effusion. The left lung is clear. No pneumothorax. The heart size and mediastinal contours are within normal limits. Normal pulmonary vascularity. No acute osseous abnormality. IMPRESSION: 1. New right-sided chest tube with unchanged small loculated pleural effusion. No pneumothorax. Electronically Signed   By: Titus Dubin M.D.   On: 07/06/2019 09:17   DG Hand Complete Left  Result Date: 07/18/2019 CLINICAL DATA:  Left hand pain and swelling described over the index finger MCP joint. EXAM: LEFT HAND - COMPLETE 3+ VIEW COMPARISON:  None. FINDINGS: No fracture or bone lesion. Mild asymmetric  joint space narrowing of several of the interphalangeal joints. There is dorsal marginal spurring at the PIP joints of the second, third and fourth fingers. MCP joints are relatively well maintained. No erosions. Soft tissue prominence is noted adjacent to the spurring at the PIP joints of the second, third and fourth fingers, and along the proximal aspect of the index finger. IMPRESSION: 1. No fracture or dislocation. 2. Arthropathic changes involving the interphalangeal joints most evident at the PIP joints of the second, third and fourth fingers. Soft tissue swelling is most prominent at the base of the index finger. No joint erosions. Electronically Signed   By: Lajean Manes M.D.   On: 07/18/2019 10:26   CT IMAGE GUIDED DRAINAGE BY PERCUTANEOUS CATHETER  Result Date: 07/10/2019 INDICATION: 74 year old with a loculated right pleural effusion. Patient already has one right chest tube but there is residual right pleural fluid and request for a second chest tube placement. EXAM: CT-GUIDED RIGHT CHEST TUBE PLACEMENT MEDICATIONS: Moderate sedation ANESTHESIA/SEDATION: 1.0 mg IV Versed 50 mcg IV Fentanyl Moderate Sedation Time:  18 minutes The patient was continuously monitored during the procedure by the interventional radiology nurse under my direct supervision. COMPLICATIONS: None immediate. TECHNIQUE: Informed written consent was obtained from the patient after a thorough discussion of the procedural risks, benefits and alternatives. All questions were addressed. A timeout was performed prior to the initiation of the procedure. PROCEDURE: Patient was placed prone on the CT scanner. Pocket of fluid along the posterior aspect of the right chest was identified and targeted. The right posterior chest was prepped with chlorhexidine and sterile field was created. Skin and soft tissues were anesthetized with 1% lidocaine. Using CT guidance, a Yueh catheter was directed into the pleural space and yellow fluid was  aspirated. Superstiff Amplatz wire was advanced into the pleural space. The tract was dilated to accommodate a 14 [place] multipurpose drain. Small amount of additional pleural fluid was aspirated. Chest tube was attached to the drainage system. Catheter was sutured to skin and a dressing was placed. FINDINGS: Loculated right pleural fluid. Pocket along the right posterior aspect of the chest was identified and targeted for drain placement. 19 French drain was successfully placed in the right pleural space. Small amount of yellow fluid was removed. Pocket of pleural air noted following chest tube placement compatible with the loculated nature of the pleural effusion. IMPRESSION: CT-guided placement of a right chest tube. Right pleural fluid is loculated. Electronically Signed   By: Markus Daft M.D.   On: 07/10/2019 13:35   CT IMAGE GUIDED DRAINAGE BY PERCUTANEOUS CATHETER  Result Date: 07/05/2019 INDICATION: Right empyema EXAM: CT-GUIDED PLACEMENT RIGHT 10 FRENCH CHEST TUBE MEDICATIONS: The patient is currently admitted to the hospital and receiving intravenous antibiotics. The antibiotics were administered within an appropriate time frame prior to the initiation of the procedure.  ANESTHESIA/SEDATION: Fentanyl 50 mcg IV; Versed 1.0 mg IV Moderate Sedation Time:  11 MINUTES The patient was continuously monitored during the procedure by the interventional radiology nurse under my direct supervision. COMPLICATIONS: None immediate. PROCEDURE: Informed written consent was obtained from the patient after a thorough discussion of the procedural risks, benefits and alternatives. All questions were addressed. Maximal Sterile Barrier Technique was utilized including caps, mask, sterile gowns, sterile gloves, sterile drape, hand hygiene and skin antiseptic. A timeout was performed prior to the initiation of the procedure. Previous imaging reviewed. Patient positioned right anterior oblique. Noncontrast localization CT  performed. The loculated right effusion was localized and marked for a lateral posterior approach. Under sterile conditions and local anesthesia, 18 gauge introducer needle was advanced from a posterolateral lower intercostal approach into the pleural fluid. Needle position confirmed with CT. Guidewire inserted followed by tract dilatation insert a 10 French drain. Drain catheter position confirmed with CT. Syringe aspiration yielded 10 cc of thick mucinous exudative fluid. Sample sent for pan culture. Catheter secured with Prolene suture and connected to external pleura vac. Sterile dressing applied. No immediate complication. Patient tolerated the procedure well. IMPRESSION: Successful CT-guided 10 French right chest tube insertion. Electronically Signed   By: Jerilynn Mages.  Shick M.D.   On: 07/05/2019 15:44   US THORACENTESIS ASP PLEURAL SPACE W/IMG GUIDE  Result Date: 07/04/2019 CLINICAL DATA:  Loculated right pleural effusion. EXAM: ULTRASOUND GUIDED RIGHT THORACENTESIS COMPARISON:  CT of the chest on 06/29/2019 PROCEDURE: An ultrasound guided thoracentesis was thoroughly discussed with the patient and questions answered. The benefits, risks, alternatives and complications were also discussed. The patient understands and wishes to proceed with the procedure. Written consent was obtained. Ultrasound was performed to localize and mark an adequate pocket of fluid in the right chest. The area was then prepped and draped in the normal sterile fashion. 1% Lidocaine was used for local anesthesia. Under ultrasound guidance a 6 French Safe-T-Centesis catheter was introduced. Fluid was removed from the catheter. The catheter was then removed. Under ultrasound guidance, a 5 [place] Yueh centesis catheter was then introduced in a slightly higher pocket of pleural fluid in the right posterior chest. Aspiration was performed through the centesis catheter. The catheter was removed and a dressing applied. FINDINGS: Ultrasound shows a  very complex appearing and loculated right pleural effusion with relatively small components posteriorly. From the first pocket sampled in the right posterior pleural space towards the lung base, only 10 mL of clear appearing amber fluid was able to be aspirated. The sample was sent for various requested labs. Decision was made to sample a second slightly higher pocket yielding 5 mL of grossly purulent whitish green thick fluid. This sample was sent for culture analysis. IMPRESSION: Highly loculated and complex right pleural effusion with relatively small pockets posteriorly. The first pocket yielded 10 mL of clear appearing amber fluid. The second more superior pocket yielded 5 mL of grossly purulent fluid likely consistent with empyema. Fluid analysis will be performed on both samples. Electronically Signed   By: Aletta Edouard M.D.   On: 07/04/2019 14:24   DG Lumbar Puncture Fluoro Guide  Result Date: 07/19/2019 CLINICAL DATA:  Neck pain, possible meningitis EXAM: DIAGNOSTIC LUMBAR PUNCTURE UNDER FLUOROSCOPIC GUIDANCE FLUOROSCOPY TIME:  Fluoroscopy Time:  4 minutes 42 seconds Radiation Exposure Index (if provided by the fluoroscopic device): 150 mGy Number of Acquired Spot Images: 2 PROCEDURE: Informed consent was obtained from the patient prior to the procedure, including potential complications of headache, allergy, and  pain. With the patient prone, the lower back was prepped with Betadine. 1% Lidocaine was used for local anesthesia. Lumbar puncture was performed at the L3 level using a 20 gauge needle with return of clear CSF with an opening pressure of 15 cm water. Sixteen ml of CSF were obtained for laboratory studies. The patient tolerated the procedure well and there were no apparent complications. IMPRESSION: Successful fluoro guided lumbar puncture for fluid. Electronically Signed   By: Inez Catalina M.D.   On: 07/19/2019 12:34      ASSESSMENT/PLAN   Community acquired Pneumonia with empyema     s/p chest tube x2 with tPA  -present on admission - due to gram positive cocci such as streptococcus or staph aureus - patient has had numerous courses of antibiotics on outpatient basis- higher risk for resistant bacteria -s/p thoracentesis with - GPC + -procalcitonin- with mild elevation >.25 -MRSA nasal PCR  -bronchopulmonary hygiene with Acapella and IS x10/h -Recruitment maneuvers for atelecatatic right lung - metaneb q4h -continue PT -CXR 07/12/19- improved from previous   Empyema of right pleural space  - +GPCs -CT surgery on case Dr Genevive Bi -appreciate input -patient is being optimized for thoracic surgery    Joint swelling and pain -resolved  - patient may have had allergic reaction to augmentin on outpatient  - additional concern for possible autoimmune phenomenon post pneumonia with GPCs   - will obtain serology for autoimmune workup    -low dose solumedrol IV daily 40mg  and bendadryl q6h 25mg      Neck pain -resolved   - no grossly appreciable stiffness    - possible related to poor sleep architecture post hospital d/c   - additionally MRI neck with severe C4 foraminal stenosis   - some concern for potential CNS infection - will obtain CSF specimen to rule out meningitis   - empirically on cefepime / vancomycin   - CSF studies are abnormal - in context of recent infection with pneumonia and empyema - will ask neurology and ID to evaluate prior to patient having thoracic surgery   -discussed with neurosurgery - no additional intervention is required for severe spinal foraminal stenosis.  Patient may have surgery and no contraindications for general anesthesia/ RSI      Appreciate collaboration from all providers.    Thank you for allowing me to participate in the care of this patient.  .   Patient/Family are satisfied with care plan and all questions have been answered.    This document was prepared using Dragon voice recognition software and may include  unintentional dictation errors.     Ottie Glazier, M.D.  Division of [facility]

## 2019-07-21 NOTE — Progress Notes (Signed)
Pharmacy Antibiotic Note  Addiel Gleissner is a 74 y.o. male admitted on 07/18/2019 with empyema and joint pain, also with concern for possible meningitis. Pharmacy has been consulted for Cefepime and vancomycin dosing.  Plan: Cefepime 2g IV Q8 hours    Height: 5\' 11"  (180.3 cm) Weight: 96.2 kg (212 lb) IBW/kg (Calculated) : 75.3  Temp (24hrs), Avg:97.6 F (36.4 C), Min:97.5 F (36.4 C), Max:97.6 F (36.4 C)  Recent Labs  Lab 07/18/19 1018 07/19/19 0447 07/19/19 1403  WBC 9.4 14.9* 13.9*  CREATININE 0.62 0.69 0.60*    Estimated Creatinine Clearance: 98.8 mL/min (A) (by C-G formula based on SCr of 0.6 mg/dL (L)).    Allergies  Allergen Reactions  . Codeine Rash    Antimicrobials this admission: Zosyn 5/25>>5/26 Vancomycin 5/25 >> 5/27 Cefepime 5/26 >>  Dose adjustments this admission:   Microbiology results: 5/25 BCx: collected    Thank you for allowing pharmacy to be a part of this patient's care.  Oswald Hillock, PharmD, BCPS 07/21/2019 9:58 AM

## 2019-07-21 NOTE — Progress Notes (Signed)
PROGRESS NOTE    Andrew Myers  [name] DOB: 1945/04/19 DOA: 07/18/2019 PCP: Derinda Late, MD   Brief Narrative:   Andrew Myers is a 74 y.o. male with medical history significant of hypertension, hyperlipidemia, OSA, BPH, left bundle blockage, recent admission due to right empyema, who presents with fever, joint pain and neck pain.  Patient was recently hospitalized from 5/12-5/21 due to right empyema.  Patient is s/p for thoracentesis and had positive culture for gram-positive bacteria.  Patient is discharged on Augmentin.  Patient states that he developed fever 102.4 yesterday afternoon. He has joint pain in left index finger and right wrist.  The left index finger is red and swelling. He also has left-sided neck pain which is worse on movement from side to side. Patient denies chest pain, shortness breath, cough. CT chest with a small loculated collection in the posterior right chest. MRI neck with evidence of mild spinal canal stenosis and severe left neural foraminal stenosis at C5-6, severe bilateral C4-5 neural foraminal stenosis, and moderate right and severe left C3-4 neural foraminal stenosis. Had LP done today to rule out meningitis.  Pulmonary, neurosurgery and ID is on board.  Subjective: Patient was feeling better today.  He was having some mild left shoulder pain.  Rest of his joint pain has been resolved.  Assessment & Plan:   Principal Problem:   Empyema of right pleural space (HCC) Active Problems:   Hypertension   Hyperlipidemia   BPH (benign prostatic hyperplasia)   Hypokalemia   Neck pain   Empyema (HCC)   Joint pain  Empyema of right pleural space [facility]): s/p of thoracentesis recently.  Still has fever. Dr. Genevive Bi of CT surgery is consulted --> planing to do right thoracotomy and decortication of lung.  Pulmonary also got involved and they of surgery and suggested to do an LP to rule out meningitis.  ID was also consulted by pulmonary.    -LP was done-CSF with lymphocytic pleocytosis with normal protein and glucose.  More consistent with aseptic meningitis/serum sickness. -Antibiotics were switched with cefepime and vancomycin by pulmonary -He was started on steroid with consern of autoimmune etiology.  C3 levels are high with C4 normal. -ID was recommending to involve rheumatology for the concern of serum sickness.  Generalized joint pain/neck pain.  MRI Neck with some foraminal stenosis, negative for any infection.  Neurosurgery was consulted by pulmonary and they do not think that he needs any surgical intervention at this time.  No cervical myelopathy. -ID wants to consult rheumatology for the concern of serum sickness. --Rheumatology was consulted-they were recommending a steroid taper for 5 to 6 days, most likely serum sickness.  If symptoms recur he will follow-up with them as an outpatient.  HTN:  -Continue home medications: Amlodipine, Bystolic, irbesartan -hydralazine prn -Hold HCTZ while patient is on n.p.o.  Hyperlipidemia -lipitor  BPH (benign prostatic hyperplasia): -flomax  Hypokalemia: K=3.3 on admission. - Repleted - Check Mg level  Objective: Vitals:   07/20/19 1209 07/20/19 2022 07/21/19 0510 07/21/19 1153  BP: 139/70 (!) 154/86 127/72 (!) 149/86  Pulse: 75 79 69 75  Resp: 18 20 18 16   Temp: 97.6 F (36.4 C) 97.6 F (36.4 C) (!) 97.5 F (36.4 C) 97.7 F (36.5 C)  TempSrc: Oral Oral Oral Oral  SpO2: 96% 98% 96% 96%  Weight:      Height:        Intake/Output Summary (Last 24 hours) at 07/21/2019 1524 Last data filed at 07/21/2019  1300 Gross per 24 hour  Intake 600 ml  Output 725 ml  Net -125 ml   Filed Weights   07/18/19 0916  Weight: 96.2 kg    Examination:  General exam: Appears calm and comfortable  Respiratory system: Clear to auscultation, breath sounds at right base, respiratory effort normal. Cardiovascular system: S1 & S2 heard, RRR. No JVD, murmurs, rubs, gallops or  clicks. Gastrointestinal system: Soft, nontender, nondistended, bowel sounds positive. Central nervous system: Alert and oriented. No focal neurological deficits.Symmetric 5 x 5 power. Extremities: No edema, no cyanosis, pulses intact and symmetrical. Psychiatry: Judgement and insight appear normal.    DVT prophylaxis: SCDs Code Status: Full Family Communication: Discussed with patient Disposition Plan:  Status is: Inpatient  Remains inpatient appropriate because:Inpatient level of care appropriate due to severity of illness   Dispo: The patient is from: Home              Anticipated d/c is to: Home              Anticipated d/c date is: 1 day.              Patient currently is not medically stable to d/c.  Consultants:  Thoracic surgery Pulmonology Neurosurgery ID Rheumatology  Procedures:  Antimicrobials:  Cefepime Vancomycin  Data Reviewed: I have personally reviewed following labs and imaging studies  CBC: Recent Labs  Lab 07/18/19 1018 07/19/19 0447 07/19/19 1403  WBC 9.4 14.9* 13.9*  NEUTROABS 7.5  --  12.3*  HGB 12.4* 12.1* 12.4*  HCT 37.1* 36.8* 36.8*  MCV 83.2 84.0 83.1  PLT 279 261 0000000   Basic Metabolic Panel: Recent Labs  Lab 07/18/19 1018 07/19/19 0447 07/19/19 1403  NA 134* 130* 131*  K 3.3* 3.3* 3.5  CL 98 96* 96*  CO2 24 21* 24  GLUCOSE 152* 161* 161*  BUN 13 10 10   CREATININE 0.62 0.69 0.60*  CALCIUM 9.2 8.7* 8.6*  MG  --  1.5*  --    GFR: Estimated Creatinine Clearance: 98.8 mL/min (A) (by C-G formula based on SCr of 0.6 mg/dL (L)). Liver Function Tests: Recent Labs  Lab 07/18/19 1018 07/19/19 1403  AST 28 18  ALT 33 23  ALKPHOS 51 47  BILITOT 1.0 1.2  PROT 8.1 7.6  ALBUMIN 3.3* 3.0*   No results for input(s): LIPASE, AMYLASE in the last 168 hours. No results for input(s): AMMONIA in the last 168 hours. Coagulation Profile: Recent Labs  Lab 07/19/19 0447  INR 1.2   Cardiac Enzymes: Recent Labs  Lab 07/19/19 1535    CKTOTAL 38*   BNP (last 3 results) No results for input(s): PROBNP in the last 8760 hours. HbA1C: No results for input(s): HGBA1C in the last 72 hours. CBG: Recent Labs  Lab 07/20/19 0807 07/21/19 0800 07/21/19 1150  GLUCAP 146* 138* 228*   Lipid Profile: No results for input(s): CHOL, HDL, LDLCALC, TRIG, CHOLHDL, LDLDIRECT in the last 72 hours. Thyroid Function Tests: No results for input(s): TSH, T4TOTAL, FREET4, T3FREE, THYROIDAB in the last 72 hours. Anemia Panel: No results for input(s): VITAMINB12, FOLATE, FERRITIN, TIBC, IRON, RETICCTPCT in the last 72 hours. Sepsis Labs: Recent Labs  Lab 07/18/19 1018 07/19/19 1403 07/20/19 0537 07/21/19 0416  PROCALCITON <0.10 0.24 0.23 0.16    Recent Results (from the past 240 hour(s))  Blood culture (routine x 2)     Status: None (Preliminary result)   Collection Time: 07/18/19 10:18 AM   Specimen: BLOOD  Result Value Ref  Range Status   Specimen Description BLOOD LEFT AC  Final   Special Requests   Final    BOTTLES DRAWN AEROBIC AND ANAEROBIC Blood Culture adequate volume   Culture   Final    NO GROWTH 3 DAYS Performed at [facility], [address] [place], [place] [address]    Report Status PENDING  Incomplete  Blood culture (routine x 2)     Status: None (Preliminary result)   Collection Time: 07/18/19 10:19 AM   Specimen: BLOOD  Result Value Ref Range Status   Specimen Description BLOOD LEFT HAND  Final   Special Requests   Final    BOTTLES DRAWN AEROBIC AND ANAEROBIC Blood Culture adequate volume   Culture   Final    NO GROWTH 3 DAYS Performed at [facility], [address], [place], [place] [address]    Report Status PENDING  Incomplete  SARS Coronavirus 2 by RT PCR (hospital order, performed in [facility] hospital lab) Nasopharyngeal Nasopharyngeal Swab     Status: None   Collection Time: 07/18/19 12:52 PM   Specimen: Nasopharyngeal Swab  Result Value Ref Range Status   SARS  Coronavirus 2 NEGATIVE NEGATIVE Final    Comment: (NOTE) SARS-CoV-2 target nucleic acids are NOT DETECTED. The SARS-CoV-2 RNA is generally detectable in upper and lower respiratory specimens during the acute phase of infection. The lowest concentration of SARS-CoV-2 viral copies this assay can detect is 250 copies / mL. A negative result does not preclude SARS-CoV-2 infection and should not be used as the sole basis for treatment or other patient management decisions.  A negative result may occur with improper specimen collection / handling, submission of specimen other than nasopharyngeal swab, presence of viral mutation(s) within the areas targeted by this assay, and inadequate number of viral copies (<250 copies / mL). A negative result must be combined with clinical observations, patient history, and epidemiological information. Fact Sheet for Patients:   StrictlyIdeas.no Fact Sheet for Healthcare Providers: BankingDealers.co.za This test is not yet approved or cleared  by the [place] FDA and has been authorized for detection and/or diagnosis of SARS-CoV-2 by FDA under an Emergency Use Authorization (EUA).  This EUA will remain in effect (meaning this test can be used) for the duration of the COVID-19 declaration under Section 564(b)(1) of the Act, 21 U.S.C. section 360bbb-3(b)(1), unless the authorization is terminated or revoked sooner. Performed at [facility], [address] [place], [place] [address]   CSF culture     Status: None (Preliminary result)   Collection Time: 07/19/19  9:16 AM   Specimen: CSF; Cerebrospinal Fluid  Result Value Ref Range Status   Specimen Description   Final    CSF Performed at [facility], [address] [place], [place] [address]    Special Requests   Final    NONE Performed at [facility], [address] [place], [place] [address]    Gram Stain   Final     WBC SEEN RED BLOOD CELLS NO ORGANISMS SEEN Performed at [facility], [address] [place], [place] [address]    Culture   Final    NO GROWTH 2 DAYS Performed at [facility], [place] [address], [place],  [address]    Report Status PENDING  Incomplete  Culture, fungus without smear     Status: None (Preliminary result)   Collection Time: 07/19/19  9:18 AM   Specimen: CSF; Cerebrospinal Fluid  Result  Value Ref Range Status   Specimen Description   Final    CSF Performed at [facility], [address], [place], [place] [address]    Special Requests   Final    NONE Performed at [facility], [address] [place], [place] [address]    Culture   Final    NO GROWTH 1 DAY Performed at [facility], [place] [address] [place], [place] [address]    Report Status PENDING  Incomplete  Chlamydia/NGC rt PCR ([place] only)     Status: None   Collection Time: 07/20/19  5:49 PM   Specimen: Urine  Result Value Ref Range Status   Specimen source GC/Chlam URINE, RANDOM  Final   Chlamydia Tr NOT DETECTED NOT DETECTED Final   N gonorrhoeae NOT DETECTED NOT DETECTED Final    Comment: (NOTE) This CT/NG assay has not been evaluated in patients with a history of  hysterectomy. Performed at [facility], [address], [place],  [address]      Radiology Studies: No results found.  Scheduled Meds: . amLODipine  5 mg Oral Daily  . aspirin EC  81 mg Oral BID  . atorvastatin  20 mg Oral QHS  . irbesartan  150 mg Oral Daily  . loratadine  10 mg Oral Daily  . methylPREDNISolone (SOLU-MEDROL) injection  20 mg Intravenous Daily  . multivitamin with minerals  1 tablet Oral Daily  . nebivolol  5 mg Oral Daily  . polyethylene glycol  17 g Oral Daily  . tamsulosin  0.8 mg Oral QPC supper   Continuous Infusions: . sodium chloride 250 mL (07/21/19 1329)  . ceFEPime (MAXIPIME) IV 2 g (07/21/19 1331)     LOS: 3 days   Time  spent: 40 minutes.  Lorella [place], MD [facility]  If 7PM-7AM, please contact night-coverage [email or web address]  07/21/2019, 3:24 PM   This record has been created using Systems analyst. Errors have been sought and corrected,but may not always be located. Such creation errors do not reflect on the standard of care.

## 2019-07-22 LAB — CBC
HCT: 35.5 % — ABNORMAL LOW (ref 39.0–52.0)
Hemoglobin: 11.4 g/dL — ABNORMAL LOW (ref 13.0–17.0)
MCH: 27.3 pg (ref 26.0–34.0)
MCHC: 32.1 g/dL (ref 30.0–36.0)
MCV: 85.1 fL (ref 80.0–100.0)
Platelets: 372 10*3/uL (ref 150–400)
RBC: 4.17 MIL/uL — ABNORMAL LOW (ref 4.22–5.81)
RDW: 13.4 % (ref 11.5–15.5)
WBC: 12.2 10*3/uL — ABNORMAL HIGH (ref 4.0–10.5)
nRBC: 0 % (ref 0.0–0.2)

## 2019-07-22 LAB — GLUCOSE, CAPILLARY: Glucose-Capillary: 104 mg/dL — ABNORMAL HIGH (ref 70–99)

## 2019-07-22 LAB — PREPARE RBC (CROSSMATCH)

## 2019-07-22 NOTE — Progress Notes (Signed)
PROGRESS NOTE    Andrew Myers  [name] DOB: Jun 06, 1945 DOA: 07/18/2019 PCP: Derinda Late, MD   Brief Narrative:   Andrew Myers is a 74 y.o. male with medical history significant of hypertension, hyperlipidemia, OSA, BPH, left bundle blockage, recent admission due to right empyema, who presents with fever, joint pain and neck pain.  Patient was recently hospitalized from 5/12-5/21 due to right empyema.  Patient is s/p for thoracentesis and had positive culture for gram-positive bacteria.  Patient is discharged on Augmentin.  Patient states that he developed fever 102.4 yesterday afternoon. He has joint pain in left index finger and right wrist.  The left index finger is red and swelling. He also has left-sided neck pain which is worse on movement from side to side. Patient denies chest pain, shortness breath, cough. CT chest with a small loculated collection in the posterior right chest. MRI neck with evidence of mild spinal canal stenosis and severe left neural foraminal stenosis at C5-6, severe bilateral C4-5 neural foraminal stenosis, and moderate right and severe left C3-4 neural foraminal stenosis. Had LP done today to rule out meningitis.  Pulmonary, neurosurgery, rheumatology and ID is on board.  Subjective: Developed left wrist swelling and pain overnight.  He was also complaining of dizziness whenever walking, stating that he feels that his balance is off.  Dizziness was more like lightheadedness.  Denies any sensation of room spinning.  Assessment & Plan:   Principal Problem:   Empyema of right pleural space (HCC) Active Problems:   Hypertension   Hyperlipidemia   BPH (benign prostatic hyperplasia)   Hypokalemia   Neck pain   Empyema (HCC)   Joint pain  Empyema of right pleural space [facility]): s/p of thoracentesis recently.  Still has fever. Dr. Genevive Bi of CT surgery is consulted --> planing to do right thoracotomy and decortication of lung.  Pulmonary  also got involved and they of surgery and suggested to do an LP to rule out meningitis.  ID was also consulted by pulmonary.   -LP was done-CSF with lymphocytic pleocytosis with normal protein and glucose.  More consistent with aseptic meningitis/serum sickness. -Antibiotics were switched with cefepime and vancomycin by pulmonary.  They were switched with Keflex and Flagyl by ID yesterday in order for discharge.  He needs to continue antibiotics for 10 more days.  Generalized joint pain/neck pain.  MRI Neck with some foraminal stenosis, negative for any infection.  Neurosurgery was consulted by pulmonary and they do not think that he needs any surgical intervention at this time.  No cervical myelopathy. -He was started on steroid with consern of autoimmune etiology.  C3 levels are high with C4 normal. Lyme serologies and parvo B virus labs are pending. Steroid resulted in improvement in his symptoms.  Yesterday he was completely symptom-free and today again developed left wrist swelling and pain overnight. -ID wants to consult rheumatology for the concern of serum sickness. --Rheumatology was consulted-they were recommending a steroid taper for 5 to 6 days, most likely serum sickness.  If symptoms recur he will follow-up with them as an outpatient.  Dizziness/imbalance.  He was complaining of more lightheadedness whenever he tried to walk.  Orthostatic vitals were checked and they were normal. -Continue to monitor-we will keep him for another day.  HTN:  -Continue home medications: Amlodipine, Bystolic, irbesartan -hydralazine prn -Hold HCTZ .  Hyperlipidemia -lipitor  BPH (benign prostatic hyperplasia): -flomax  Hypokalemia: Resolved - Replete as needed.  Objective: Vitals:   07/22/19 [id number]  07/22/19 1231 07/22/19 1233 07/22/19 1235  BP: (!) 145/74 140/90 (!) 143/85 131/78  Pulse: 75 69 71 72  Resp: 20 16    Temp: (!) 97.5 F (36.4 C) (!) 97.4 F (36.3 C)    TempSrc: Oral Oral     SpO2: 97% 97%    Weight:      Height:        Intake/Output Summary (Last 24 hours) at 07/22/2019 1517 Last data filed at 07/22/2019 0859 Gross per 24 hour  Intake 240 ml  Output 1600 ml  Net -1360 ml   Filed Weights   07/18/19 0916  Weight: 96.2 kg    Examination:  General exam: Appears calm and comfortable  Respiratory system: Clear to auscultation, breath sounds at right base, respiratory effort normal. Cardiovascular system: S1 & S2 heard, RRR. No JVD, murmurs, rubs, gallops or clicks. Gastrointestinal system: Soft, nontender, nondistended, bowel sounds positive. Central nervous system: Alert and oriented. No focal neurological deficits.Symmetric 5 x 5 power. Extremities: No edema, no cyanosis, pulses intact and symmetrical.  Mild edema and tenderness of left wrist. Psychiatry: Judgement and insight appear normal.    DVT prophylaxis: SCDs Code Status: Full Family Communication: Discussed with patient Disposition Plan:  Status is: Inpatient  Remains inpatient appropriate because:Inpatient level of care appropriate due to severity of illness   Dispo: The patient is from: Home              Anticipated d/c is to: Home              Anticipated d/c date is: 1 day.              Patient currently is not medically stable to d/c.  Consultants:  Thoracic surgery Pulmonology Neurosurgery ID Rheumatology  Procedures:  Antimicrobials:  Keflex Flagyl  Data Reviewed: I have personally reviewed following labs and imaging studies  CBC: Recent Labs  Lab 07/18/19 1018 07/19/19 0447 07/19/19 1403 07/22/19 0506  WBC 9.4 14.9* 13.9* 12.2*  NEUTROABS 7.5  --  12.3*  --   HGB 12.4* 12.1* 12.4* 11.4*  HCT 37.1* 36.8* 36.8* 35.5*  MCV 83.2 84.0 83.1 85.1  PLT 279 261 278 [id number]   Basic Metabolic Panel: Recent Labs  Lab 07/18/19 1018 07/19/19 0447 07/19/19 1403  NA 134* 130* 131*  K 3.3* 3.3* 3.5  CL 98 96* 96*  CO2 24 21* 24  GLUCOSE 152* 161* 161*  BUN 13 10 10    CREATININE 0.62 0.69 0.60*  CALCIUM 9.2 8.7* 8.6*  MG  --  1.5*  --    GFR: Estimated Creatinine Clearance: 98.8 mL/min (A) (by C-G formula based on SCr of 0.6 mg/dL (L)). Liver Function Tests: Recent Labs  Lab 07/18/19 1018 07/19/19 1403  AST 28 18  ALT 33 23  ALKPHOS 51 47  BILITOT 1.0 1.2  PROT 8.1 7.6  ALBUMIN 3.3* 3.0*   No results for input(s): LIPASE, AMYLASE in the last 168 hours. No results for input(s): AMMONIA in the last 168 hours. Coagulation Profile: Recent Labs  Lab 07/19/19 0447  INR 1.2   Cardiac Enzymes: Recent Labs  Lab 07/19/19 1535  CKTOTAL 38*   BNP (last 3 results) No results for input(s): PROBNP in the last 8760 hours. HbA1C: No results for input(s): HGBA1C in the last 72 hours. CBG: Recent Labs  Lab 07/20/19 0807 07/21/19 0800 07/21/19 1150 07/22/19 0807  GLUCAP 146* 138* 228* 104*   Lipid Profile: No results for input(s): CHOL, HDL, LDLCALC, TRIG,  CHOLHDL, LDLDIRECT in the last 72 hours. Thyroid Function Tests: No results for input(s): TSH, T4TOTAL, FREET4, T3FREE, THYROIDAB in the last 72 hours. Anemia Panel: No results for input(s): VITAMINB12, FOLATE, FERRITIN, TIBC, IRON, RETICCTPCT in the last 72 hours. Sepsis Labs: Recent Labs  Lab 07/18/19 1018 07/19/19 1403 07/20/19 0537 07/21/19 0416  PROCALCITON <0.10 0.24 0.23 0.16    Recent Results (from the past 240 hour(s))  Blood culture (routine x 2)     Status: None (Preliminary result)   Collection Time: 07/18/19 10:18 AM   Specimen: BLOOD  Result Value Ref Range Status   Specimen Description BLOOD LEFT [facility]  Final   Special Requests   Final    BOTTLES DRAWN AEROBIC AND ANAEROBIC Blood Culture adequate volume   Culture   Final    NO GROWTH 4 DAYS Performed at [facility], [address] [place], [place] [address]    Report Status PENDING  Incomplete  Blood culture (routine x 2)     Status: None (Preliminary result)   Collection Time: 07/18/19 10:19 AM    Specimen: BLOOD  Result Value Ref Range Status   Specimen Description BLOOD LEFT HAND  Final   Special Requests   Final    BOTTLES DRAWN AEROBIC AND ANAEROBIC Blood Culture adequate volume   Culture   Final    NO GROWTH 4 DAYS Performed at [facility], [address], [place], [place] [address]    Report Status PENDING  Incomplete  SARS Coronavirus 2 by RT PCR (hospital order, performed in [place] hospital lab) Nasopharyngeal Nasopharyngeal Swab     Status: None   Collection Time: 07/18/19 12:52 PM   Specimen: Nasopharyngeal Swab  Result Value Ref Range Status   SARS Coronavirus 2 NEGATIVE NEGATIVE Final    Comment: (NOTE) SARS-CoV-2 target nucleic acids are NOT DETECTED. The SARS-CoV-2 RNA is generally detectable in upper and lower respiratory specimens during the acute phase of infection. The lowest concentration of SARS-CoV-2 viral copies this assay can detect is 250 copies / mL. A negative result does not preclude SARS-CoV-2 infection and should not be used as the sole basis for treatment or other patient management decisions.  A negative result may occur with improper specimen collection / handling, submission of specimen other than nasopharyngeal swab, presence of viral mutation(s) within the areas targeted by this assay, and inadequate number of viral copies (<250 copies / mL). A negative result must be combined with clinical observations, patient history, and epidemiological information. Fact Sheet for Patients:   StrictlyIdeas.no Fact Sheet for Healthcare Providers: BankingDealers.co.za This test is not yet approved or cleared  by the [place] FDA and has been authorized for detection and/or diagnosis of SARS-CoV-2 by FDA under an Emergency Use Authorization (EUA).  This EUA will remain in effect (meaning this test can be used) for the duration of the COVID-19 declaration under Section 564(b)(1) of the Act,  21 U.S.C. section 360bbb-3(b)(1), unless the authorization is terminated or revoked sooner. Performed at [facility], [address] [place], [place] [address]   CSF culture     Status: None (Preliminary result)   Collection Time: 07/19/19  9:16 AM   Specimen: CSF; Cerebrospinal Fluid  Result Value Ref Range Status   Specimen Description   Final    CSF Performed at [facility], [address], [place], [place] [address]    Special Requests   Final    NONE Performed at [facility], [address] [place], [place] [address]    Gram Stain   Final    WBC SEEN RED BLOOD CELLS NO ORGANISMS SEEN Performed at [facility], [address] [place], [place] [address]    Culture   Final    NO GROWTH 3 DAYS Performed at [facility], [place] [address], [place], [place] [address]    Report Status PENDING  Incomplete  Culture, fungus without smear     Status: None (Preliminary result)   Collection Time: 07/19/19  9:18 AM   Specimen: CSF; Cerebrospinal Fluid  Result Value Ref Range Status   Specimen Description   Final    CSF Performed at [facility], [address], [place], [place] [address]    Special Requests   Final    NONE Performed at [facility], [address] [place], [place] [address]    Culture   Final    NO FUNGUS ISOLATED AFTER 2 DAYS Performed at [facility], [place] [address] [place], [place] [address]    Report Status PENDING  Incomplete  Chlamydia/NGC rt PCR ([place] only)     Status: None   Collection Time: 07/20/19  5:49 PM   Specimen: Urine  Result Value Ref Range Status   Specimen source GC/Chlam URINE, RANDOM  Final   Chlamydia Tr NOT DETECTED NOT DETECTED Final   N gonorrhoeae NOT DETECTED NOT DETECTED Final    Comment: (NOTE) This CT/NG assay has not been evaluated in patients with a history of  hysterectomy. Performed at [facility], [address]  [place], [place] [address]      Radiology Studies: No results found.  Scheduled Meds: . amLODipine  5 mg Oral Daily  . aspirin EC  81 mg Oral BID  . atorvastatin  20 mg Oral QHS  . cephALEXin  500 mg Oral Q6H  . irbesartan  150 mg Oral Daily  . loratadine  10 mg Oral Daily  . methylPREDNISolone (SOLU-MEDROL) injection  20 mg Intravenous Daily  . metroNIDAZOLE  500 mg Oral Q8H  . multivitamin with minerals  1 tablet Oral Daily  . nebivolol  5 mg Oral Daily  . polyethylene glycol  17 g Oral Daily  . tamsulosin  0.8 mg Oral QPC supper   Continuous Infusions: . sodium chloride 250 mL (07/21/19 1329)     LOS: 4 days   Time spent: 40 minutes.  Lorella Nimrod, MD [facility]  If 7PM-7AM, please contact night-coverage [email or web address]  07/22/2019, 3:17 PM   This record has been created using Systems analyst. Errors have been sought and corrected,but may not always be located. Such creation errors do not reflect on the standard of care.

## 2019-07-22 NOTE — Progress Notes (Signed)
Pulmonary Medicine          Date: 07/22/2019,   MRN# [id number] Andrew Myers January 08, 1946     AdmissionWeight: 96.2 kg                 CurrentWeight: 96.2 kg   Refering physician:Dr Agbata   CHIEF COMPLAINT:   Empyema s/p intrapleural drain with recurrence of symptoms   SUBJECTIVE   -Patient is sitting up in bed in no distress-07/22/19  -patient was able to do 12 laps around hallway  -non-febrile since 5/27  -Incentive spirometer- he is up to maximum tidal volumes  Able to breathe 2150cc per breath.   -Discussed case with multiple subspecialists appreciate all involved.   -Plan for d/c home in am   PAST MEDICAL HISTORY   Past Medical History:  Diagnosis Date  . BPH (benign prostatic hyperplasia)   . CAP (community acquired pneumonia) 04/2019  . Empyema lung ([place]) 06/2019  . Hyperlipidemia   . Hypertension   . Obesity   . Pleural effusion on right 05/2019  . Skin cancer    BCC, SCC  . Sleep apnea   . Sleep apnea      SURGICAL HISTORY   Past Surgical History:  Procedure Laterality Date  . APPENDECTOMY    . CATARACT EXTRACTION    . FRACTURE SURGERY       FAMILY HISTORY   Family History  Problem Relation Age of Onset  . Breast cancer Mother   . Hypertension Mother   . Hyperlipidemia Mother   . Heart failure Mother   . Heart attack Father   . Thyroid cancer Sister      SOCIAL HISTORY   Social History   Tobacco Use  . Smoking status: Former Smoker    Quit date: 05/07/1967    Years since quitting: 52.2  . Smokeless tobacco: Never Used  Substance Use Topics  . Alcohol use: Yes    Comment: rarely  . Drug use: No     MEDICATIONS    Home Medication:    Current Medication:  Current Facility-Administered Medications:  .  0.9 %  sodium chloride infusion, , Intravenous, PRN, Lorella Nimrod, MD, Last Rate: 10 mL/hr at 07/21/19 1329, 250 mL at 07/21/19 1329 .  acetaminophen (TYLENOL) tablet 650 mg, 650 mg, Oral, Q6H  PRN, Ivor Costa, MD, 650 mg at 07/22/19 0220 .  amLODipine (NORVASC) tablet 5 mg, 5 mg, Oral, Daily, Ivor Costa, MD, 5 mg at 07/22/19 0959 .  aspirin EC tablet 81 mg, 81 mg, Oral, BID, Ivor Costa, MD, 81 mg at 07/22/19 0959 .  atorvastatin (LIPITOR) tablet 20 mg, 20 mg, Oral, QHS, Ivor Costa, MD, 20 mg at 07/21/19 2233 .  cephALEXin (KEFLEX) capsule 500 mg, 500 mg, Oral, Q6H, Ravishankar, Jayashree, MD, 500 mg at 07/22/19 1231 .  hydrALAZINE (APRESOLINE) injection 5 mg, 5 mg, Intravenous, Q2H PRN, Ivor Costa, MD .  hydrOXYzine (VISTARIL) injection 25 mg, 25 mg, Intramuscular, Q6H PRN, Ivor Costa, MD .  irbesartan (AVAPRO) tablet 150 mg, 150 mg, Oral, Daily, Ivor Costa, MD, 150 mg at 07/22/19 1001 .  loratadine (CLARITIN) tablet 10 mg, 10 mg, Oral, Daily, Ivor Costa, MD, 10 mg at 07/22/19 1000 .  methylPREDNISolone sodium succinate (SOLU-MEDROL) 40 mg/mL injection 20 mg, 20 mg, Intravenous, Daily, Lanney Gins, Latashia Koch, MD, 20 mg at 07/22/19 1000 .  metroNIDAZOLE (FLAGYL) tablet 500 mg, 500 mg, Oral, Q8H, Ravishankar, Jayashree, MD, 500 mg at 07/22/19 1508 .  multivitamin with  minerals tablet 1 tablet, 1 tablet, Oral, Daily, Ivor Costa, MD, 1 tablet at 07/22/19 0959 .  nebivolol (BYSTOLIC) tablet 5 mg, 5 mg, Oral, Daily, Ivor Costa, MD, 5 mg at 07/22/19 1000 .  oxyCODONE-acetaminophen (PERCOCET/ROXICET) 5-325 MG per tablet 1 tablet, 1 tablet, Oral, Q4H PRN, Ivor Costa, MD, 1 tablet at 07/22/19 0302 .  polyethylene glycol (MIRALAX / GLYCOLAX) packet 17 g, 17 g, Oral, Daily, Lorella Nimrod, MD, 17 g at 07/22/19 1000 .  tamsulosin (FLOMAX) capsule 0.8 mg, 0.8 mg, Oral, QPC supper, Ivor Costa, MD, 0.8 mg at 07/21/19 1743    ALLERGIES   Codeine     REVIEW OF SYSTEMS    Review of Systems:  Gen:  Denies  fever, sweats, chills weigh loss  HEENT: Denies blurred vision, double vision, ear pain, eye pain, hearing loss, nose bleeds, sore throat+neck pain  Cardiac:  No dizziness, chest pain or heaviness,  chest tightness,edema Resp:   Denies cough or sputum porduction, shortness of breath,wheezing, hemoptysis,  Gi: Denies swallowing difficulty, stomach pain, nausea or vomiting, diarrhea, constipation, bowel incontinence Gu:  Denies bladder incontinence, burning urine Ext:   Admits to joint pain upper extermiteis including elbows, shoulders and MCP Skin: +erythema over joints Endoc:  Denies polyuria, polydipsia , polyphagia or weight change Psych:   Denies depression, insomnia or hallucinations   Other:  All other systems negative   VS: BP 131/78   Pulse 72   Temp (!) 97.4 F (36.3 C) (Oral)   Resp 16   Ht 5\' 11"  (1.803 m)   Wt 96.2 kg   SpO2 97%   BMI 29.57 kg/m      PHYSICAL EXAM    GENERAL:NAD, no fevers, chills, no weakness no fatigue HEAD: Normocephalic, atraumatic.  EYES: Pupils equal, round, reactive to light. Extraocular muscles intact. No scleral icterus.  MOUTH: Moist mucosal membrane. Dentition intact. No abscess noted.  EAR, NOSE, THROAT: Clear without exudates. No external lesions.  NECK: Supple. No thyromegaly. No nodules. No JVD.  PULMONARY: Clear to ausculatation bilaterally with mild decrement on right side CARDIOVASCULAR: S1 and S2. Regular rate and rhythm. No murmurs, rubs, or gallops. No edema. Pedal pulses 2+ bilaterally.  GASTROINTESTINAL: Soft, nontender, nondistended. No masses. Positive bowel sounds. No hepatosplenomegaly.  MUSCULOSKELETAL: No swelling, clubbing, or edema. Range of motion full in all extremities.  NEUROLOGIC: Cranial nerves II through XII are intact. No gross focal neurological deficits. Sensation intact. Reflexes intact.  SKIN: No ulceration, lesions, rashes, or cyanosis. Skin warm and dry. Turgor intact.  PSYCHIATRIC: Mood, affect within normal limits. The patient is awake, alert and oriented x 3. Insight, judgment intact.       IMAGING    DG Chest 1 View  Result Date: 07/04/2019 CLINICAL DATA:  Status post right  thoracentesis. EXAM: CHEST  1 VIEW COMPARISON:  CT of the chest on 06/29/2019 FINDINGS: The heart size and mediastinal contours are within normal limits. Residual lateral/posterolateral component of loculated pleural fluid remains on the right. No pneumothorax after right thoracentesis. No pulmonary edema. The visualized skeletal structures are unremarkable. IMPRESSION: No pneumothorax after right sided thoracentesis. Residual lateral/posterolateral component of loculated right pleural fluid remains. Electronically Signed   By: Aletta Edouard M.D.   On: 07/04/2019 14:26   DG Chest 2 View  Result Date: 07/18/2019 CLINICAL DATA:  Fever.  Chest tube removal EXAM: CHEST - 2 VIEW COMPARISON:  Jul 13, 2019 FINDINGS: Chest tube has been removed. No appreciable pneumothorax. There is a small right pleural  effusion with ill-defined airspace opacity in the right mid and lower lung zones. Left lung clear. Heart size and pulmonary vascularity are normal. No adenopathy. There is mild degenerative change in the thoracic spine. IMPRESSION: No evident pneumothorax. Small right pleural effusion with ill-defined opacity in the right mid and lower lung zones, likely combination of loculated pleural effusion and atelectasis. Mild pneumonia cannot be excluded in this area. Lungs elsewhere clear. Cardiac silhouette within normal limits. Electronically Signed   By: Lowella Grip III M.D.   On: 07/18/2019 10:57   DG Chest 2 View  Result Date: 07/12/2019 CLINICAL DATA:  Empyema.  Chest tube present. EXAM: CHEST - 2 VIEW COMPARISON:  07/11/2019 FINDINGS: Right basilar chest tube unchanged. Lungs are somewhat hypoinflated with evidence of a small amount right pleural fluid without significant change. No pneumothorax. Remainder of the lungs are clear. Cardiomediastinal silhouette and remainder of the exam is unchanged. IMPRESSION: Small amount right pleural fluid with right basilar chest tube unchanged. Electronically Signed   By:  Marin Olp M.D.   On: 07/12/2019 08:25   CT CHEST WO CONTRAST  Result Date: 07/07/2019 CLINICAL DATA:  Follow-up scan for empyema of the RIGHT lung, chest tube placed 2 days ago EXAM: CT CHEST WITHOUT CONTRAST TECHNIQUE: Multidetector CT imaging of the chest was performed following the standard protocol without IV contrast. COMPARISON:  06/28/2009 and intervening interventional and ultrasound evaluations. FINDINGS: Cardiovascular: Calcified atheromatous plaque in the thoracic aorta similar to prior study. No aneurysmal dilation. Heart size is stable without pericardial effusion. Calcified coronary artery disease as before. Vessels not well assessed given lack of intravenous contrast. Mediastinum/Nodes: No thoracic inlet adenopathy. No axillary lymphadenopathy. No mediastinal adenopathy. Esophagus grossly normal. Lungs/Pleura: Since the previous examination there is been interval placement of a RIGHT-sided chest tube. A small amount of subcutaneous emphysema is noted. The marker for chest tube sideholes is at least 1.5 cm within the RIGHT chest cavity within the pleural space. Pleural fluid at the RIGHT lung base is perhaps slightly diminished but there is no change in the loculated fluid along the fissure in the RIGHT chest when compared to the prior exam RIGHT basilar consolidative changes and volume loss are similar to the prior study. LEFT chest is clear. Airways are patent. Upper Abdomen: Upper abdominal contents are unremarkable. No acute process in the upper abdomen. Musculoskeletal: Small amount of subcutaneous emphysema along the RIGHT chest wall as discussed. No chest wall mass. No acute bone process. Spinal degenerative change. IMPRESSION: 1. Slightly diminished RIGHT pleural fluid, dominant loculated area along the major fissure in the RIGHT chest is unchanged. 2. Persistent basilar consolidation/volume loss without change. This may represent rounded atelectasis or pneumonia. Follow-up is suggested  to ensure there is no underlying mass. 3. Small amount of subcutaneous emphysema along the chest wall. Marker for sideholes is within the pleural space as discussed above. 4. Atherosclerotic changes in the thoracic aorta. Calcified coronary artery disease. 5. Aortic atherosclerosis. Aortic Atherosclerosis (ICD10-I70.0). Electronically Signed   By: Zetta Bills M.D.   On: 07/07/2019 16:33   CT CHEST WO CONTRAST  Result Date: 06/29/2019 CLINICAL DATA:  Pleural effusion, shortness of breath. Follow-up chest x-ray EXAM: CT CHEST WITHOUT CONTRAST TECHNIQUE: Multidetector CT imaging of the chest was performed following the standard protocol without IV contrast. COMPARISON:  Chest x-ray 05/11/2019 FINDINGS: Cardiovascular: Heart is normal size. Aorta normal caliber. Scattered coronary artery and aortic calcifications. Mediastinum/Nodes: No mediastinal, hilar, or axillary adenopathy. Trachea and esophagus are unremarkable. Thyroid unremarkable.  Lungs/Pleura: Partially loculated moderate-sized right pleural effusion noted. Compressive atelectasis or pneumonia in the right lower lobe. Scattered granulomas in the right lung. Left lung clear. Upper Abdomen: Imaging into the upper abdomen shows no acute findings. Musculoskeletal: Chest wall soft tissues are unremarkable. No acute bony abnormality. IMPRESSION: Moderate partially loculated right pleural effusion. Right lower lobe compressive atelectasis versus pneumonia. Electronically Signed   By: Rolm Baptise M.D.   On: 06/29/2019 14:56   CT Chest W Contrast  Addendum Date: 07/18/2019   ADDENDUM REPORT: 07/18/2019 13:07 ADDENDUM: Enlarged and mildly heterogeneous LEFT hemi thyroid. Recommend thyroid ultrasound (ref: J Am Coll Radiol. 2015 Feb;12(2): 143-50). These results were called by telephone at the time of interpretation on 07/18/2019 at 1:07 pm to provider Duffy Bruce , who verbally acknowledged these results. Electronically Signed   By: Zetta Bills M.D.    On: 07/18/2019 13:07   Result Date: 07/18/2019 CLINICAL DATA:  Chest pain shortness of breath. EXAM: CT CHEST WITH CONTRAST TECHNIQUE: Multidetector CT imaging of the chest was performed during intravenous contrast administration. CONTRAST:  43mL OMNIPAQUE IOHEXOL 300 MG/ML  SOLN COMPARISON:  07/07/2019 FINDINGS: Cardiovascular: Heart size is normal without pericardial effusion. Calcified and noncalcified plaque throughout the thoracic aorta is similar to the prior study. Central pulmonary vasculature is unremarkable on venous phase imaging. Mediastinum/Nodes: Mildly heterogeneous enlargement of the LEFT hemi thyroid. No thoracic inlet adenopathy. No axillary lymphadenopathy. No hilar lymphadenopathy. Lungs/Pleura: Basilar consolidation on the RIGHT with small amount of pleural fluid, diminished since 07/07/2019. Post removal of RIGHT-sided chest tube. Small partially loculated area posterior to consolidated lung in the RIGHT chest measuring approximately 4.3 x 2.2 cm. Unchanged in this location when compared to the previous imaging study. Near complete resolution of pleural fluid elsewhere in the chest. Improved aeration overall since the study of 07/07/2019 in the RIGHT lower lobe. Area of consolidative change shows distortion of pulmonary vasculature and bronchovascular structures extending to the posterior RIGHT chest. Upper Abdomen: Incidental imaging of upper abdominal contents is unremarkable. Musculoskeletal: Spinal degenerative changes without acute or destructive bone process. IMPRESSION: 1. Basilar consolidation on the RIGHT with small amount of pleural fluid, diminished since 07/07/2019. Post removal of RIGHT-sided chest tube. 2. Small partially loculated area posterior to consolidated lung in the RIGHT chest measuring approximately 4.3 x 2.2 cm, unchanged in this location when compared to the previous imaging study. Compatible with persistent small volume empyema a a 3. Improved aeration at the RIGHT  lung base still with an area of consolidative change adjacent to small loculated collection in the posterior RIGHT chest. This may represent partially trapped lung or developing rounded atelectasis. Continued follow-up is suggested. 4. Aortic atherosclerosis. Aortic Atherosclerosis (ICD10-I70.0). Electronically Signed: By: Zetta Bills M.D. On: 07/18/2019 12:22   MR CERVICAL SPINE W WO CONTRAST  Result Date: 07/19/2019 CLINICAL DATA:  Left-sided neck pain EXAM: MRI CERVICAL SPINE WITHOUT AND WITH CONTRAST TECHNIQUE: Multiplanar and multiecho pulse sequences of the cervical spine, to include the craniocervical junction and cervicothoracic junction, were obtained without and with intravenous contrast. CONTRAST:  9mL GADAVIST GADOBUTROL 1 MMOL/ML IV SOLN COMPARISON:  None. FINDINGS: Alignment: Physiologic. Vertebrae: No fracture, evidence of discitis, or bone lesion. Cord: Normal signal and morphology. Posterior Fossa, vertebral arteries, paraspinal tissues: Negative. Disc levels: C1-2: Unremarkable. C2-3: Normal disc space and facet joints. There is no spinal canal stenosis. No neural foraminal stenosis. C3-4: Small disc bulge with bilateral uncovertebral hypertrophy. There is no spinal canal stenosis. Moderate right and severe left  neural foraminal stenosis. C4-5: Small disc bulge with right-greater-than-left facet hypertrophy and uncovertebral spurring. There is no spinal canal stenosis. Severe bilateral neural foraminal stenosis. C5-6: Disc space narrowing with intermediate disc osteophyte complex. Mild spinal canal stenosis. Mild right and severe left neural foraminal stenosis. C6-7: Small disc bulge. There is no spinal canal stenosis. No neural foraminal stenosis. C7-T1: Normal disc space and facet joints. There is no spinal canal stenosis. No neural foraminal stenosis. IMPRESSION: 1. Mild spinal canal stenosis and severe left neural foraminal stenosis at C5-6. 2. Severe bilateral C4-5 neural foraminal  stenosis. 3. Moderate right and severe left C3-4 neural foraminal stenosis. Electronically Signed   By: Ulyses Jarred M.D.   On: 07/19/2019 01:48   DG Chest Port 1 View  Result Date: 07/13/2019 CLINICAL DATA:  Empyema with chest tube present EXAM: PORTABLE CHEST 1 VIEW COMPARISON:  Jul 12, 2019 FINDINGS: Stable chest tube positioning on the right. No pneumothorax. Small right pleural effusion with right base atelectasis present. Lungs elsewhere clear. Heart size and pulmonary vascularity are normal. No adenopathy. There is degenerative change in the thoracic spine. IMPRESSION: Stable chest tube positioning without pneumothorax. Small right pleural effusion with right base atelectasis present. Lungs elsewhere clear. Stable cardiac silhouette. Electronically Signed   By: Lowella Grip III M.D.   On: 07/13/2019 09:31   DG Chest Port 1 View  Result Date: 07/11/2019 CLINICAL DATA:  Empyema. EXAM: PORTABLE CHEST 1 VIEW COMPARISON:  Yesterday FINDINGS: Additional right-sided pleural catheter placed in the interim. There is improved aeration at the right base with decreased pleural fluid. Normal heart size. No visible pneumothorax. IMPRESSION: Improved right base aeration after additional catheter placement. Electronically Signed   By: Monte Fantasia M.D.   On: 07/11/2019 08:56   DG Chest Port 1 View  Result Date: 07/10/2019 CLINICAL DATA:  Atelectasis EXAM: PORTABLE CHEST 1 VIEW COMPARISON:  Portable exam at 0743 hrs compared to 07/09/2019 FINDINGS: Upper-normal size of cardiac silhouette. Mediastinal contours and pulmonary vascularity normal. Persistent RIGHT pleural effusion and basilar atelectasis. LEFT lung clear. No acute infiltrate or pneumothorax. Endplate spur formation thoracic spine, mild. IMPRESSION: Persistent RIGHT pleural effusion and basilar atelectasis. Electronically Signed   By: Lavonia Dana M.D.   On: 07/10/2019 08:20   DG Chest Port 1 View  Result Date: 07/09/2019 CLINICAL DATA:   Empyema, RIGHT-sided chest tube in place. EXAM: PORTABLE CHEST 1 VIEW COMPARISON:  Chest x-ray dated 07/07/2019. FINDINGS: RIGHT basilar chest tube appears stable in position. The overlying RIGHT pleural fluid is stable in extent. LEFT lung remains clear. No pneumothorax is seen. Heart size and mediastinal contours are stable. IMPRESSION: Stable chest x-ray. RIGHT-sided chest tube is stable in position. The overlying RIGHT pleural fluid is stable in extent. Electronically Signed   By: Franki Cabot M.D.   On: 07/09/2019 10:10   DG Chest Port 1 View  Result Date: 07/07/2019 CLINICAL DATA:  Follow-up right chest tube. EXAM: PORTABLE CHEST 1 VIEW COMPARISON:  07/06/2018 FINDINGS: Chest tube remains in place at the right lung base. There is slightly less pleural fluid than was seen yesterday. No pleural air is visible. Some pleural fluid does persist, with right lower lung atelectasis. Left hemithorax appears clear. IMPRESSION: Persistent right chest tube. Slightly less right pleural fluid and less pleural air. Moderate amount of pleural fluid does persist with lower right lung atelectasis. Electronically Signed   By: Nelson Chimes M.D.   On: 07/07/2019 11:55   DG Chest [facility] 1 View  Result  Date: 07/06/2019 CLINICAL DATA:  Right empyema. EXAM: PORTABLE CHEST 1 VIEW COMPARISON:  Chest x-ray dated Jul 04, 2019. FINDINGS: New right pigtail chest tube with unchanged small loculated pleural effusion. The left lung is clear. No pneumothorax. The heart size and mediastinal contours are within normal limits. Normal pulmonary vascularity. No acute osseous abnormality. IMPRESSION: 1. New right-sided chest tube with unchanged small loculated pleural effusion. No pneumothorax. Electronically Signed   By: Titus Dubin M.D.   On: 07/06/2019 09:17   DG Hand Complete Left  Result Date: 07/18/2019 CLINICAL DATA:  Left hand pain and swelling described over the index finger MCP joint. EXAM: LEFT HAND - COMPLETE 3+ VIEW  COMPARISON:  None. FINDINGS: No fracture or bone lesion. Mild asymmetric joint space narrowing of several of the interphalangeal joints. There is dorsal marginal spurring at the PIP joints of the second, third and fourth fingers. MCP joints are relatively well maintained. No erosions. Soft tissue prominence is noted adjacent to the spurring at the PIP joints of the second, third and fourth fingers, and along the proximal aspect of the index finger. IMPRESSION: 1. No fracture or dislocation. 2. Arthropathic changes involving the interphalangeal joints most evident at the PIP joints of the second, third and fourth fingers. Soft tissue swelling is most prominent at the base of the index finger. No joint erosions. Electronically Signed   By: Lajean Manes M.D.   On: 07/18/2019 10:26   CT IMAGE GUIDED DRAINAGE BY PERCUTANEOUS CATHETER  Result Date: 07/10/2019 INDICATION: 74 year old with a loculated right pleural effusion. Patient already has one right chest tube but there is residual right pleural fluid and request for a second chest tube placement. EXAM: CT-GUIDED RIGHT CHEST TUBE PLACEMENT MEDICATIONS: Moderate sedation ANESTHESIA/SEDATION: 1.0 mg IV Versed 50 mcg IV Fentanyl Moderate Sedation Time:  18 minutes The patient was continuously monitored during the procedure by the interventional radiology nurse under my direct supervision. COMPLICATIONS: None immediate. TECHNIQUE: Informed written consent was obtained from the patient after a thorough discussion of the procedural risks, benefits and alternatives. All questions were addressed. A timeout was performed prior to the initiation of the procedure. PROCEDURE: Patient was placed prone on the CT scanner. Pocket of fluid along the posterior aspect of the right chest was identified and targeted. The right posterior chest was prepped with chlorhexidine and sterile field was created. Skin and soft tissues were anesthetized with 1% lidocaine. Using CT guidance, a  Yueh catheter was directed into the pleural space and yellow fluid was aspirated. Superstiff Amplatz wire was advanced into the pleural space. The tract was dilated to accommodate a 14 [place] multipurpose drain. Small amount of additional pleural fluid was aspirated. Chest tube was attached to the drainage system. Catheter was sutured to skin and a dressing was placed. FINDINGS: Loculated right pleural fluid. Pocket along the right posterior aspect of the chest was identified and targeted for drain placement. 4 French drain was successfully placed in the right pleural space. Small amount of yellow fluid was removed. Pocket of pleural air noted following chest tube placement compatible with the loculated nature of the pleural effusion. IMPRESSION: CT-guided placement of a right chest tube. Right pleural fluid is loculated. Electronically Signed   By: Markus Daft M.D.   On: 07/10/2019 13:35   CT IMAGE GUIDED DRAINAGE BY PERCUTANEOUS CATHETER  Result Date: 07/05/2019 INDICATION: Right empyema EXAM: CT-GUIDED PLACEMENT RIGHT 10 FRENCH CHEST TUBE MEDICATIONS: The patient is currently admitted to the hospital and receiving intravenous antibiotics. The  antibiotics were administered within an appropriate time frame prior to the initiation of the procedure. ANESTHESIA/SEDATION: Fentanyl 50 mcg IV; Versed 1.0 mg IV Moderate Sedation Time:  11 MINUTES The patient was continuously monitored during the procedure by the interventional radiology nurse under my direct supervision. COMPLICATIONS: None immediate. PROCEDURE: Informed written consent was obtained from the patient after a thorough discussion of the procedural risks, benefits and alternatives. All questions were addressed. Maximal Sterile Barrier Technique was utilized including caps, mask, sterile gowns, sterile gloves, sterile drape, hand hygiene and skin antiseptic. A timeout was performed prior to the initiation of the procedure. Previous imaging reviewed.  Patient positioned right anterior oblique. Noncontrast localization CT performed. The loculated right effusion was localized and marked for a lateral posterior approach. Under sterile conditions and local anesthesia, 18 gauge introducer needle was advanced from a posterolateral lower intercostal approach into the pleural fluid. Needle position confirmed with CT. Guidewire inserted followed by tract dilatation insert a 10 French drain. Drain catheter position confirmed with CT. Syringe aspiration yielded 10 cc of thick mucinous exudative fluid. Sample sent for pan culture. Catheter secured with Prolene suture and connected to external pleura vac. Sterile dressing applied. No immediate complication. Patient tolerated the procedure well. IMPRESSION: Successful CT-guided 10 French right chest tube insertion. Electronically Signed   By: Jerilynn Mages.  Shick M.D.   On: 07/05/2019 15:44   US THORACENTESIS ASP PLEURAL SPACE W/IMG GUIDE  Result Date: 07/04/2019 CLINICAL DATA:  Loculated right pleural effusion. EXAM: ULTRASOUND GUIDED RIGHT THORACENTESIS COMPARISON:  CT of the chest on 06/29/2019 PROCEDURE: An ultrasound guided thoracentesis was thoroughly discussed with the patient and questions answered. The benefits, risks, alternatives and complications were also discussed. The patient understands and wishes to proceed with the procedure. Written consent was obtained. Ultrasound was performed to localize and mark an adequate pocket of fluid in the right chest. The area was then prepped and draped in the normal sterile fashion. 1% Lidocaine was used for local anesthesia. Under ultrasound guidance a 6 French Safe-T-Centesis catheter was introduced. Fluid was removed from the catheter. The catheter was then removed. Under ultrasound guidance, a 5 [place] Yueh centesis catheter was then introduced in a slightly higher pocket of pleural fluid in the right posterior chest. Aspiration was performed through the centesis catheter. The  catheter was removed and a dressing applied. FINDINGS: Ultrasound shows a very complex appearing and loculated right pleural effusion with relatively small components posteriorly. From the first pocket sampled in the right posterior pleural space towards the lung base, only 10 mL of clear appearing amber fluid was able to be aspirated. The sample was sent for various requested labs. Decision was made to sample a second slightly higher pocket yielding 5 mL of grossly purulent whitish green thick fluid. This sample was sent for culture analysis. IMPRESSION: Highly loculated and complex right pleural effusion with relatively small pockets posteriorly. The first pocket yielded 10 mL of clear appearing amber fluid. The second more superior pocket yielded 5 mL of grossly purulent fluid likely consistent with empyema. Fluid analysis will be performed on both samples. Electronically Signed   By: Aletta Edouard M.D.   On: 07/04/2019 14:24   DG Lumbar Puncture Fluoro Guide  Result Date: 07/19/2019 CLINICAL DATA:  Neck pain, possible meningitis EXAM: DIAGNOSTIC LUMBAR PUNCTURE UNDER FLUOROSCOPIC GUIDANCE FLUOROSCOPY TIME:  Fluoroscopy Time:  4 minutes 42 seconds Radiation Exposure Index (if provided by the fluoroscopic device): 150 mGy Number of Acquired Spot Images: 2 PROCEDURE: Informed consent was  obtained from the patient prior to the procedure, including potential complications of headache, allergy, and pain. With the patient prone, the lower back was prepped with Betadine. 1% Lidocaine was used for local anesthesia. Lumbar puncture was performed at the L3 level using a 20 gauge needle with return of clear CSF with an opening pressure of 15 cm water. Sixteen ml of CSF were obtained for laboratory studies. The patient tolerated the procedure well and there were no apparent complications. IMPRESSION: Successful fluoro guided lumbar puncture for fluid. Electronically Signed   By: Inez Catalina M.D.   On: 07/19/2019 12:34       ASSESSMENT/PLAN   Community acquired Pneumonia with empyema   s/p chest tube x2 with tPA  -present on admission - due to gram positive cocci such as streptococcus or staph aureus - patient has had numerous courses of antibiotics on outpatient basis- higher risk for resistant bacteria -s/p thoracentesis with - GPC + -procalcitonin- with mild elevation >.25 -MRSA nasal PCR  -bronchopulmonary hygiene with Acapella and IS x10/h -Recruitment maneuvers for atelecatatic right lung - metaneb q4h -continue PT -CXR 07/12/19- improved from previous   Empyema of right pleural space  - +GPCs -CT surgery on case Dr Genevive Bi -appreciate input -patient is being optimized for thoracic surgery    Joint swelling and pain -resolved  - patient may have had allergic reaction to augmentin on outpatient  - additional concern for possible autoimmune phenomenon post pneumonia with GPCs   - will obtain serology for autoimmune workup    -low dose solumedrol IV daily 40mg  and bendadryl q6h 25mg      Neck pain -resolved   - no grossly appreciable stiffness    - possible related to poor sleep architecture post hospital d/c   - additionally MRI neck with severe C4 foraminal stenosis   - some concern for potential CNS infection - will obtain CSF specimen to rule out meningitis   - empirically on cefepime / vancomycin   - CSF studies are abnormal - in context of recent infection with pneumonia and empyema - will ask neurology and ID to evaluate prior to patient having thoracic surgery   -discussed with neurosurgery - no additional intervention is required for severe spinal foraminal stenosis.  Patient may have surgery and no contraindications for general anesthesia/ RSI      Appreciate collaboration from all providers.    Thank you for allowing me to participate in the care of this patient.  .   Patient/Family are satisfied with care plan and all questions have been answered.    This document was  prepared using Dragon voice recognition software and may include unintentional dictation errors.     Ottie Glazier, M.D.  Division [facility]

## 2019-07-22 NOTE — Progress Notes (Signed)
Patient refused 2200 dose of Keflex, as he understood Dr. Ottie Glazier, M.D. to say he was not to take anything related to Penicillin.  RN placed AM dose on hold and informed Rufina Falco, NP of refusal.

## 2019-07-23 LAB — HUMAN PARVOVIRUS DNA DETECTION BY PCR: Parvovirus B19, PCR: NEGATIVE

## 2019-07-23 LAB — CULTURE, BLOOD (ROUTINE X 2)
Culture: NO GROWTH
Culture: NO GROWTH
Special Requests: ADEQUATE
Special Requests: ADEQUATE

## 2019-07-23 LAB — CBC
HCT: 36.3 % — ABNORMAL LOW (ref 39.0–52.0)
Hemoglobin: 11.8 g/dL — ABNORMAL LOW (ref 13.0–17.0)
MCH: 27.6 pg (ref 26.0–34.0)
MCHC: 32.5 g/dL (ref 30.0–36.0)
MCV: 84.8 fL (ref 80.0–100.0)
Platelets: 403 10*3/uL — ABNORMAL HIGH (ref 150–400)
RBC: 4.28 MIL/uL (ref 4.22–5.81)
RDW: 13.3 % (ref 11.5–15.5)
WBC: 13.3 10*3/uL — ABNORMAL HIGH (ref 4.0–10.5)
nRBC: 0 % (ref 0.0–0.2)

## 2019-07-23 LAB — CSF CULTURE W GRAM STAIN: Culture: NO GROWTH

## 2019-07-23 LAB — BASIC METABOLIC PANEL
Anion gap: 8 (ref 5–15)
BUN: 16 mg/dL (ref 8–23)
CO2: 25 mmol/L (ref 22–32)
Calcium: 8.6 mg/dL — ABNORMAL LOW (ref 8.9–10.3)
Chloride: 104 mmol/L (ref 98–111)
Creatinine, Ser: 0.51 mg/dL — ABNORMAL LOW (ref 0.61–1.24)
GFR calc Af Amer: >60 mL/min (ref >60)
GFR calc non Af Amer: >60 mL/min (ref >60)
Glucose, Bld: 117 mg/dL — ABNORMAL HIGH (ref 70–99)
Potassium: 3.7 mmol/L (ref 3.5–5.1)
Sodium: 137 mmol/L (ref 135–145)

## 2019-07-23 LAB — HSV(HERPES SMPLX VRS)ABS-I+II(IGG)-CSF: HSV Type I/II Ab, IgG CSF: <0.34 IV (ref <=0.89)

## 2019-07-23 LAB — GLUCOSE, CAPILLARY: Glucose-Capillary: 106 mg/dL — ABNORMAL HIGH (ref 70–99)

## 2019-07-23 NOTE — Progress Notes (Signed)
Pulmonary Medicine          Date: 07/23/2019,   MRN# [id number] Andrew Myers 22-May-1945     AdmissionWeight: 96.2 kg                 CurrentWeight: 96.2 kg   Refering physician:Dr Agbata   CHIEF COMPLAINT:   Empyema s/p intrapleural drain with recurrence of symptoms   SUBJECTIVE   -Patient is resting comfortably in bed.07/23/19   -slight increase in Wbc count this am without fevers overnight.     -Patient may be discharged home from pulmonary perspective with additional follow up on outpatient.   PAST MEDICAL HISTORY   Past Medical History:  Diagnosis Date  . BPH (benign prostatic hyperplasia)   . CAP (community acquired pneumonia) 04/2019  . Empyema lung ([place]) 06/2019  . Hyperlipidemia   . Hypertension   . Obesity   . Pleural effusion on right 05/2019  . Skin cancer    BCC, SCC  . Sleep apnea   . Sleep apnea      SURGICAL HISTORY   Past Surgical History:  Procedure Laterality Date  . APPENDECTOMY    . CATARACT EXTRACTION    . FRACTURE SURGERY       FAMILY HISTORY   Family History  Problem Relation Age of Onset  . Breast cancer Mother   . Hypertension Mother   . Hyperlipidemia Mother   . Heart failure Mother   . Heart attack Father   . Thyroid cancer Sister      SOCIAL HISTORY   Social History   Tobacco Use  . Smoking status: Former Smoker    Quit date: 05/07/1967    Years since quitting: 52.2  . Smokeless tobacco: Never Used  Substance Use Topics  . Alcohol use: Yes    Comment: rarely  . Drug use: No     MEDICATIONS    Home Medication:    Current Medication:  Current Facility-Administered Medications:  .  0.9 %  sodium chloride infusion, , Intravenous, PRN, Lorella Nimrod, MD, Last Rate: 10 mL/hr at 07/21/19 1329, 250 mL at 07/21/19 1329 .  acetaminophen (TYLENOL) tablet 650 mg, 650 mg, Oral, Q6H PRN, Ivor Costa, MD, 650 mg at 07/22/19 0220 .  amLODipine (NORVASC) tablet 5 mg, 5 mg, Oral, Daily, Ivor Costa, MD, 5 mg at 07/23/19 1104 .  aspirin EC tablet 81 mg, 81 mg, Oral, BID, Ivor Costa, MD, 81 mg at 07/23/19 1105 .  atorvastatin (LIPITOR) tablet 20 mg, 20 mg, Oral, QHS, Ivor Costa, MD, 20 mg at 07/22/19 2214 .  cephALEXin (KEFLEX) capsule 500 mg, 500 mg, Oral, Q6H, Ravishankar, Jayashree, MD, 500 mg at 07/23/19 1315 .  hydrALAZINE (APRESOLINE) injection 5 mg, 5 mg, Intravenous, Q2H PRN, Ivor Costa, MD .  hydrOXYzine (VISTARIL) injection 25 mg, 25 mg, Intramuscular, Q6H PRN, Ivor Costa, MD .  irbesartan (AVAPRO) tablet 150 mg, 150 mg, Oral, Daily, Ivor Costa, MD, 150 mg at 07/23/19 1105 .  loratadine (CLARITIN) tablet 10 mg, 10 mg, Oral, Daily, Ivor Costa, MD, 10 mg at 07/23/19 1104 .  methylPREDNISolone sodium succinate (SOLU-MEDROL) 40 mg/mL injection 20 mg, 20 mg, Intravenous, Daily, Lanney Gins, Leler Brion, MD, 20 mg at 07/23/19 1104 .  metroNIDAZOLE (FLAGYL) tablet 500 mg, 500 mg, Oral, Q8H, Ravishankar, Jayashree, MD, 500 mg at 07/23/19 1255 .  multivitamin with minerals tablet 1 tablet, 1 tablet, Oral, Daily, Ivor Costa, MD, 1 tablet at 07/23/19 1104 .  nebivolol (BYSTOLIC) tablet 5  mg, 5 mg, Oral, Daily, Ivor Costa, MD, 5 mg at 07/23/19 1255 .  oxyCODONE-acetaminophen (PERCOCET/ROXICET) 5-325 MG per tablet 1 tablet, 1 tablet, Oral, Q4H PRN, Ivor Costa, MD, 1 tablet at 07/22/19 0302 .  polyethylene glycol (MIRALAX / GLYCOLAX) packet 17 g, 17 g, Oral, Daily, Lorella Nimrod, MD, 17 g at 07/22/19 1000 .  tamsulosin (FLOMAX) capsule 0.8 mg, 0.8 mg, Oral, QPC supper, Ivor Costa, MD, 0.8 mg at 07/23/19 1730    ALLERGIES   Penicillins and Codeine     REVIEW OF SYSTEMS    Review of Systems:  Gen:  Denies  fever, sweats, chills weigh loss  HEENT: Denies blurred vision, double vision, ear pain, eye pain, hearing loss, nose bleeds, sore throat+neck pain  Cardiac:  No dizziness, chest pain or heaviness, chest tightness,edema Resp:   Denies cough or sputum porduction, shortness of  breath,wheezing, hemoptysis,  Gi: Denies swallowing difficulty, stomach pain, nausea or vomiting, diarrhea, constipation, bowel incontinence Gu:  Denies bladder incontinence, burning urine Ext:   Admits to joint pain upper extermiteis including elbows, shoulders and MCP Skin: +erythema over joints Endoc:  Denies polyuria, polydipsia , polyphagia or weight change Psych:   Denies depression, insomnia or hallucinations   Other:  All other systems negative   VS: BP (!) 165/89   Pulse 71   Temp 97.6 F (36.4 C) (Oral)   Resp 16   Ht 5\' 11"  (1.803 m)   Wt 96.2 kg   SpO2 98%   BMI 29.57 kg/m      PHYSICAL EXAM    GENERAL:NAD, no fevers, chills, no weakness no fatigue HEAD: Normocephalic, atraumatic.  EYES: Pupils equal, round, reactive to light. Extraocular muscles intact. No scleral icterus.  MOUTH: Moist mucosal membrane. Dentition intact. No abscess noted.  EAR, NOSE, THROAT: Clear without exudates. No external lesions.  NECK: Supple. No thyromegaly. No nodules. No JVD.  PULMONARY: Clear to ausculatation bilaterally with mild decrement on right side CARDIOVASCULAR: S1 and S2. Regular rate and rhythm. No murmurs, rubs, or gallops. No edema. Pedal pulses 2+ bilaterally.  GASTROINTESTINAL: Soft, nontender, nondistended. No masses. Positive bowel sounds. No hepatosplenomegaly.  MUSCULOSKELETAL: No swelling, clubbing, or edema. Range of motion full in all extremities.  NEUROLOGIC: Cranial nerves II through XII are intact. No gross focal neurological deficits. Sensation intact. Reflexes intact.  SKIN: No ulceration, lesions, rashes, or cyanosis. Skin warm and dry. Turgor intact.  PSYCHIATRIC: Mood, affect within normal limits. The patient is awake, alert and oriented x 3. Insight, judgment intact.       IMAGING    DG Chest 1 View  Result Date: 07/04/2019 CLINICAL DATA:  Status post right thoracentesis. EXAM: CHEST  1 VIEW COMPARISON:  CT of the chest on 06/29/2019 FINDINGS: The  heart size and mediastinal contours are within normal limits. Residual lateral/posterolateral component of loculated pleural fluid remains on the right. No pneumothorax after right thoracentesis. No pulmonary edema. The visualized skeletal structures are unremarkable. IMPRESSION: No pneumothorax after right sided thoracentesis. Residual lateral/posterolateral component of loculated right pleural fluid remains. Electronically Signed   By: Aletta Edouard M.D.   On: 07/04/2019 14:26   DG Chest 2 View  Result Date: 07/18/2019 CLINICAL DATA:  Fever.  Chest tube removal EXAM: CHEST - 2 VIEW COMPARISON:  Jul 13, 2019 FINDINGS: Chest tube has been removed. No appreciable pneumothorax. There is a small right pleural effusion with ill-defined airspace opacity in the right mid and lower lung zones. Left lung clear. Heart size and pulmonary  vascularity are normal. No adenopathy. There is mild degenerative change in the thoracic spine. IMPRESSION: No evident pneumothorax. Small right pleural effusion with ill-defined opacity in the right mid and lower lung zones, likely combination of loculated pleural effusion and atelectasis. Mild pneumonia cannot be excluded in this area. Lungs elsewhere clear. Cardiac silhouette within normal limits. Electronically Signed   By: Lowella Grip III M.D.   On: 07/18/2019 10:57   DG Chest 2 View  Result Date: 07/12/2019 CLINICAL DATA:  Empyema.  Chest tube present. EXAM: CHEST - 2 VIEW COMPARISON:  07/11/2019 FINDINGS: Right basilar chest tube unchanged. Lungs are somewhat hypoinflated with evidence of a small amount right pleural fluid without significant change. No pneumothorax. Remainder of the lungs are clear. Cardiomediastinal silhouette and remainder of the exam is unchanged. IMPRESSION: Small amount right pleural fluid with right basilar chest tube unchanged. Electronically Signed   By: Marin Olp M.D.   On: 07/12/2019 08:25   CT CHEST WO CONTRAST  Result Date:  07/07/2019 CLINICAL DATA:  Follow-up scan for empyema of the RIGHT lung, chest tube placed 2 days ago EXAM: CT CHEST WITHOUT CONTRAST TECHNIQUE: Multidetector CT imaging of the chest was performed following the standard protocol without IV contrast. COMPARISON:  06/28/2009 and intervening interventional and ultrasound evaluations. FINDINGS: Cardiovascular: Calcified atheromatous plaque in the thoracic aorta similar to prior study. No aneurysmal dilation. Heart size is stable without pericardial effusion. Calcified coronary artery disease as before. Vessels not well assessed given lack of intravenous contrast. Mediastinum/Nodes: No thoracic inlet adenopathy. No axillary lymphadenopathy. No mediastinal adenopathy. Esophagus grossly normal. Lungs/Pleura: Since the previous examination there is been interval placement of a RIGHT-sided chest tube. A small amount of subcutaneous emphysema is noted. The marker for chest tube sideholes is at least 1.5 cm within the RIGHT chest cavity within the pleural space. Pleural fluid at the RIGHT lung base is perhaps slightly diminished but there is no change in the loculated fluid along the fissure in the RIGHT chest when compared to the prior exam RIGHT basilar consolidative changes and volume loss are similar to the prior study. LEFT chest is clear. Airways are patent. Upper Abdomen: Upper abdominal contents are unremarkable. No acute process in the upper abdomen. Musculoskeletal: Small amount of subcutaneous emphysema along the RIGHT chest wall as discussed. No chest wall mass. No acute bone process. Spinal degenerative change. IMPRESSION: 1. Slightly diminished RIGHT pleural fluid, dominant loculated area along the major fissure in the RIGHT chest is unchanged. 2. Persistent basilar consolidation/volume loss without change. This may represent rounded atelectasis or pneumonia. Follow-up is suggested to ensure there is no underlying mass. 3. Small amount of subcutaneous emphysema  along the chest wall. Marker for sideholes is within the pleural space as discussed above. 4. Atherosclerotic changes in the thoracic aorta. Calcified coronary artery disease. 5. Aortic atherosclerosis. Aortic Atherosclerosis (ICD10-I70.0). Electronically Signed   By: Zetta Bills M.D.   On: 07/07/2019 16:33   CT CHEST WO CONTRAST  Result Date: 06/29/2019 CLINICAL DATA:  Pleural effusion, shortness of breath. Follow-up chest x-ray EXAM: CT CHEST WITHOUT CONTRAST TECHNIQUE: Multidetector CT imaging of the chest was performed following the standard protocol without IV contrast. COMPARISON:  Chest x-ray 05/11/2019 FINDINGS: Cardiovascular: Heart is normal size. Aorta normal caliber. Scattered coronary artery and aortic calcifications. Mediastinum/Nodes: No mediastinal, hilar, or axillary adenopathy. Trachea and esophagus are unremarkable. Thyroid unremarkable. Lungs/Pleura: Partially loculated moderate-sized right pleural effusion noted. Compressive atelectasis or pneumonia in the right lower lobe. Scattered granulomas in  the right lung. Left lung clear. Upper Abdomen: Imaging into the upper abdomen shows no acute findings. Musculoskeletal: Chest wall soft tissues are unremarkable. No acute bony abnormality. IMPRESSION: Moderate partially loculated right pleural effusion. Right lower lobe compressive atelectasis versus pneumonia. Electronically Signed   By: Rolm Baptise M.D.   On: 06/29/2019 14:56   CT Chest W Contrast  Addendum Date: 07/18/2019   ADDENDUM REPORT: 07/18/2019 13:07 ADDENDUM: Enlarged and mildly heterogeneous LEFT hemi thyroid. Recommend thyroid ultrasound (ref: J Am Coll Radiol. 2015 Feb;12(2): 143-50). These results were called by telephone at the time of interpretation on 07/18/2019 at 1:07 pm to provider Duffy Bruce , who verbally acknowledged these results. Electronically Signed   By: Zetta Bills M.D.   On: 07/18/2019 13:07   Result Date: 07/18/2019 CLINICAL DATA:  Chest pain  shortness of breath. EXAM: CT CHEST WITH CONTRAST TECHNIQUE: Multidetector CT imaging of the chest was performed during intravenous contrast administration. CONTRAST:  65mL OMNIPAQUE IOHEXOL 300 MG/ML  SOLN COMPARISON:  07/07/2019 FINDINGS: Cardiovascular: Heart size is normal without pericardial effusion. Calcified and noncalcified plaque throughout the thoracic aorta is similar to the prior study. Central pulmonary vasculature is unremarkable on venous phase imaging. Mediastinum/Nodes: Mildly heterogeneous enlargement of the LEFT hemi thyroid. No thoracic inlet adenopathy. No axillary lymphadenopathy. No hilar lymphadenopathy. Lungs/Pleura: Basilar consolidation on the RIGHT with small amount of pleural fluid, diminished since 07/07/2019. Post removal of RIGHT-sided chest tube. Small partially loculated area posterior to consolidated lung in the RIGHT chest measuring approximately 4.3 x 2.2 cm. Unchanged in this location when compared to the previous imaging study. Near complete resolution of pleural fluid elsewhere in the chest. Improved aeration overall since the study of 07/07/2019 in the RIGHT lower lobe. Area of consolidative change shows distortion of pulmonary vasculature and bronchovascular structures extending to the posterior RIGHT chest. Upper Abdomen: Incidental imaging of upper abdominal contents is unremarkable. Musculoskeletal: Spinal degenerative changes without acute or destructive bone process. IMPRESSION: 1. Basilar consolidation on the RIGHT with small amount of pleural fluid, diminished since 07/07/2019. Post removal of RIGHT-sided chest tube. 2. Small partially loculated area posterior to consolidated lung in the RIGHT chest measuring approximately 4.3 x 2.2 cm, unchanged in this location when compared to the previous imaging study. Compatible with persistent small volume empyema a a 3. Improved aeration at the RIGHT lung base still with an area of consolidative change adjacent to small  loculated collection in the posterior RIGHT chest. This may represent partially trapped lung or developing rounded atelectasis. Continued follow-up is suggested. 4. Aortic atherosclerosis. Aortic Atherosclerosis (ICD10-I70.0). Electronically Signed: By: Zetta Bills M.D. On: 07/18/2019 12:22   MR CERVICAL SPINE W WO CONTRAST  Result Date: 07/19/2019 CLINICAL DATA:  Left-sided neck pain EXAM: MRI CERVICAL SPINE WITHOUT AND WITH CONTRAST TECHNIQUE: Multiplanar and multiecho pulse sequences of the cervical spine, to include the craniocervical junction and cervicothoracic junction, were obtained without and with intravenous contrast. CONTRAST:  30mL GADAVIST GADOBUTROL 1 MMOL/ML IV SOLN COMPARISON:  None. FINDINGS: Alignment: Physiologic. Vertebrae: No fracture, evidence of discitis, or bone lesion. Cord: Normal signal and morphology. Posterior Fossa, vertebral arteries, paraspinal tissues: Negative. Disc levels: C1-2: Unremarkable. C2-3: Normal disc space and facet joints. There is no spinal canal stenosis. No neural foraminal stenosis. C3-4: Small disc bulge with bilateral uncovertebral hypertrophy. There is no spinal canal stenosis. Moderate right and severe left neural foraminal stenosis. C4-5: Small disc bulge with right-greater-than-left facet hypertrophy and uncovertebral spurring. There is no spinal canal stenosis.  Severe bilateral neural foraminal stenosis. C5-6: Disc space narrowing with intermediate disc osteophyte complex. Mild spinal canal stenosis. Mild right and severe left neural foraminal stenosis. C6-7: Small disc bulge. There is no spinal canal stenosis. No neural foraminal stenosis. C7-T1: Normal disc space and facet joints. There is no spinal canal stenosis. No neural foraminal stenosis. IMPRESSION: 1. Mild spinal canal stenosis and severe left neural foraminal stenosis at C5-6. 2. Severe bilateral C4-5 neural foraminal stenosis. 3. Moderate right and severe left C3-4 neural foraminal stenosis.  Electronically Signed   By: Ulyses Jarred M.D.   On: 07/19/2019 01:48   DG Chest Port 1 View  Result Date: 07/13/2019 CLINICAL DATA:  Empyema with chest tube present EXAM: PORTABLE CHEST 1 VIEW COMPARISON:  Jul 12, 2019 FINDINGS: Stable chest tube positioning on the right. No pneumothorax. Small right pleural effusion with right base atelectasis present. Lungs elsewhere clear. Heart size and pulmonary vascularity are normal. No adenopathy. There is degenerative change in the thoracic spine. IMPRESSION: Stable chest tube positioning without pneumothorax. Small right pleural effusion with right base atelectasis present. Lungs elsewhere clear. Stable cardiac silhouette. Electronically Signed   By: Lowella Grip III M.D.   On: 07/13/2019 09:31   DG Chest Port 1 View  Result Date: 07/11/2019 CLINICAL DATA:  Empyema. EXAM: PORTABLE CHEST 1 VIEW COMPARISON:  Yesterday FINDINGS: Additional right-sided pleural catheter placed in the interim. There is improved aeration at the right base with decreased pleural fluid. Normal heart size. No visible pneumothorax. IMPRESSION: Improved right base aeration after additional catheter placement. Electronically Signed   By: Monte Fantasia M.D.   On: 07/11/2019 08:56   DG Chest Port 1 View  Result Date: 07/10/2019 CLINICAL DATA:  Atelectasis EXAM: PORTABLE CHEST 1 VIEW COMPARISON:  Portable exam at 0743 hrs compared to 07/09/2019 FINDINGS: Upper-normal size of cardiac silhouette. Mediastinal contours and pulmonary vascularity normal. Persistent RIGHT pleural effusion and basilar atelectasis. LEFT lung clear. No acute infiltrate or pneumothorax. Endplate spur formation thoracic spine, mild. IMPRESSION: Persistent RIGHT pleural effusion and basilar atelectasis. Electronically Signed   By: Lavonia Dana M.D.   On: 07/10/2019 08:20   DG Chest Port 1 View  Result Date: 07/09/2019 CLINICAL DATA:  Empyema, RIGHT-sided chest tube in place. EXAM: PORTABLE CHEST 1 VIEW  COMPARISON:  Chest x-ray dated 07/07/2019. FINDINGS: RIGHT basilar chest tube appears stable in position. The overlying RIGHT pleural fluid is stable in extent. LEFT lung remains clear. No pneumothorax is seen. Heart size and mediastinal contours are stable. IMPRESSION: Stable chest x-ray. RIGHT-sided chest tube is stable in position. The overlying RIGHT pleural fluid is stable in extent. Electronically Signed   By: Franki Cabot M.D.   On: 07/09/2019 10:10   DG Chest Port 1 View  Result Date: 07/07/2019 CLINICAL DATA:  Follow-up right chest tube. EXAM: PORTABLE CHEST 1 VIEW COMPARISON:  07/06/2018 FINDINGS: Chest tube remains in place at the right lung base. There is slightly less pleural fluid than was seen yesterday. No pleural air is visible. Some pleural fluid does persist, with right lower lung atelectasis. Left hemithorax appears clear. IMPRESSION: Persistent right chest tube. Slightly less right pleural fluid and less pleural air. Moderate amount of pleural fluid does persist with lower right lung atelectasis. Electronically Signed   By: Nelson Chimes M.D.   On: 07/07/2019 11:55   DG Chest Port 1 View  Result Date: 07/06/2019 CLINICAL DATA:  Right empyema. EXAM: PORTABLE CHEST 1 VIEW COMPARISON:  Chest x-ray dated Jul 04, 2019.  FINDINGS: New right pigtail chest tube with unchanged small loculated pleural effusion. The left lung is clear. No pneumothorax. The heart size and mediastinal contours are within normal limits. Normal pulmonary vascularity. No acute osseous abnormality. IMPRESSION: 1. New right-sided chest tube with unchanged small loculated pleural effusion. No pneumothorax. Electronically Signed   By: Titus Dubin M.D.   On: 07/06/2019 09:17   DG Hand Complete Left  Result Date: 07/18/2019 CLINICAL DATA:  Left hand pain and swelling described over the index finger MCP joint. EXAM: LEFT HAND - COMPLETE 3+ VIEW COMPARISON:  None. FINDINGS: No fracture or bone lesion. Mild asymmetric  joint space narrowing of several of the interphalangeal joints. There is dorsal marginal spurring at the PIP joints of the second, third and fourth fingers. MCP joints are relatively well maintained. No erosions. Soft tissue prominence is noted adjacent to the spurring at the PIP joints of the second, third and fourth fingers, and along the proximal aspect of the index finger. IMPRESSION: 1. No fracture or dislocation. 2. Arthropathic changes involving the interphalangeal joints most evident at the PIP joints of the second, third and fourth fingers. Soft tissue swelling is most prominent at the base of the index finger. No joint erosions. Electronically Signed   By: Lajean Manes M.D.   On: 07/18/2019 10:26   CT IMAGE GUIDED DRAINAGE BY PERCUTANEOUS CATHETER  Result Date: 07/10/2019 INDICATION: 74 year old with a loculated right pleural effusion. Patient already has one right chest tube but there is residual right pleural fluid and request for a second chest tube placement. EXAM: CT-GUIDED RIGHT CHEST TUBE PLACEMENT MEDICATIONS: Moderate sedation ANESTHESIA/SEDATION: 1.0 mg IV Versed 50 mcg IV Fentanyl Moderate Sedation Time:  18 minutes The patient was continuously monitored during the procedure by the interventional radiology nurse under my direct supervision. COMPLICATIONS: None immediate. TECHNIQUE: Informed written consent was obtained from the patient after a thorough discussion of the procedural risks, benefits and alternatives. All questions were addressed. A timeout was performed prior to the initiation of the procedure. PROCEDURE: Patient was placed prone on the CT scanner. Pocket of fluid along the posterior aspect of the right chest was identified and targeted. The right posterior chest was prepped with chlorhexidine and sterile field was created. Skin and soft tissues were anesthetized with 1% lidocaine. Using CT guidance, a Yueh catheter was directed into the pleural space and yellow fluid was  aspirated. Superstiff Amplatz wire was advanced into the pleural space. The tract was dilated to accommodate a 14 [place] multipurpose drain. Small amount of additional pleural fluid was aspirated. Chest tube was attached to the drainage system. Catheter was sutured to skin and a dressing was placed. FINDINGS: Loculated right pleural fluid. Pocket along the right posterior aspect of the chest was identified and targeted for drain placement. 50 French drain was successfully placed in the right pleural space. Small amount of yellow fluid was removed. Pocket of pleural air noted following chest tube placement compatible with the loculated nature of the pleural effusion. IMPRESSION: CT-guided placement of a right chest tube. Right pleural fluid is loculated. Electronically Signed   By: Markus Daft M.D.   On: 07/10/2019 13:35   CT IMAGE GUIDED DRAINAGE BY PERCUTANEOUS CATHETER  Result Date: 07/05/2019 INDICATION: Right empyema EXAM: CT-GUIDED PLACEMENT RIGHT 10 FRENCH CHEST TUBE MEDICATIONS: The patient is currently admitted to the hospital and receiving intravenous antibiotics. The antibiotics were administered within an appropriate time frame prior to the initiation of the procedure. ANESTHESIA/SEDATION: Fentanyl 50 mcg IV;  Versed 1.0 mg IV Moderate Sedation Time:  11 MINUTES The patient was continuously monitored during the procedure by the interventional radiology nurse under my direct supervision. COMPLICATIONS: None immediate. PROCEDURE: Informed written consent was obtained from the patient after a thorough discussion of the procedural risks, benefits and alternatives. All questions were addressed. Maximal Sterile Barrier Technique was utilized including caps, mask, sterile gowns, sterile gloves, sterile drape, hand hygiene and skin antiseptic. A timeout was performed prior to the initiation of the procedure. Previous imaging reviewed. Patient positioned right anterior oblique. Noncontrast localization CT  performed. The loculated right effusion was localized and marked for a lateral posterior approach. Under sterile conditions and local anesthesia, 18 gauge introducer needle was advanced from a posterolateral lower intercostal approach into the pleural fluid. Needle position confirmed with CT. Guidewire inserted followed by tract dilatation insert a 10 French drain. Drain catheter position confirmed with CT. Syringe aspiration yielded 10 cc of thick mucinous exudative fluid. Sample sent for pan culture. Catheter secured with Prolene suture and connected to external pleura vac. Sterile dressing applied. No immediate complication. Patient tolerated the procedure well. IMPRESSION: Successful CT-guided 10 French right chest tube insertion. Electronically Signed   By: Jerilynn Mages.  Shick M.D.   On: 07/05/2019 15:44   US THORACENTESIS ASP PLEURAL SPACE W/IMG GUIDE  Result Date: 07/04/2019 CLINICAL DATA:  Loculated right pleural effusion. EXAM: ULTRASOUND GUIDED RIGHT THORACENTESIS COMPARISON:  CT of the chest on 06/29/2019 PROCEDURE: An ultrasound guided thoracentesis was thoroughly discussed with the patient and questions answered. The benefits, risks, alternatives and complications were also discussed. The patient understands and wishes to proceed with the procedure. Written consent was obtained. Ultrasound was performed to localize and mark an adequate pocket of fluid in the right chest. The area was then prepped and draped in the normal sterile fashion. 1% Lidocaine was used for local anesthesia. Under ultrasound guidance a 6 French Safe-T-Centesis catheter was introduced. Fluid was removed from the catheter. The catheter was then removed. Under ultrasound guidance, a 5 [place] Yueh centesis catheter was then introduced in a slightly higher pocket of pleural fluid in the right posterior chest. Aspiration was performed through the centesis catheter. The catheter was removed and a dressing applied. FINDINGS: Ultrasound shows a  very complex appearing and loculated right pleural effusion with relatively small components posteriorly. From the first pocket sampled in the right posterior pleural space towards the lung base, only 10 mL of clear appearing amber fluid was able to be aspirated. The sample was sent for various requested labs. Decision was made to sample a second slightly higher pocket yielding 5 mL of grossly purulent whitish green thick fluid. This sample was sent for culture analysis. IMPRESSION: Highly loculated and complex right pleural effusion with relatively small pockets posteriorly. The first pocket yielded 10 mL of clear appearing amber fluid. The second more superior pocket yielded 5 mL of grossly purulent fluid likely consistent with empyema. Fluid analysis will be performed on both samples. Electronically Signed   By: Aletta Edouard M.D.   On: 07/04/2019 14:24   DG Lumbar Puncture Fluoro Guide  Result Date: 07/19/2019 CLINICAL DATA:  Neck pain, possible meningitis EXAM: DIAGNOSTIC LUMBAR PUNCTURE UNDER FLUOROSCOPIC GUIDANCE FLUOROSCOPY TIME:  Fluoroscopy Time:  4 minutes 42 seconds Radiation Exposure Index (if provided by the fluoroscopic device): 150 mGy Number of Acquired Spot Images: 2 PROCEDURE: Informed consent was obtained from the patient prior to the procedure, including potential complications of headache, allergy, and pain. With the patient prone,  the lower back was prepped with Betadine. 1% Lidocaine was used for local anesthesia. Lumbar puncture was performed at the L3 level using a 20 gauge needle with return of clear CSF with an opening pressure of 15 cm water. Sixteen ml of CSF were obtained for laboratory studies. The patient tolerated the procedure well and there were no apparent complications. IMPRESSION: Successful fluoro guided lumbar puncture for fluid. Electronically Signed   By: Inez Catalina M.D.   On: 07/19/2019 12:34      ASSESSMENT/PLAN   Community acquired Pneumonia with empyema    s/p chest tube x2 with tPA  -present on admission - due to gram positive cocci such as streptococcus or staph aureus - patient has had numerous courses of antibiotics on outpatient basis- higher risk for resistant bacteria -s/p thoracentesis with - GPC + -procalcitonin- with mild elevation >.25 -MRSA nasal PCR  -bronchopulmonary hygiene with Acapella and IS x10/h -Recruitment maneuvers for atelecatatic right lung - metaneb q4h -continue PT -CXR 07/12/19- improved from previous   Empyema of right pleural space-resolved  - +GPCs -CT surgery on case Dr Genevive Bi -appreciate input -patient is being optimized for thoracic surgery    Joint swelling and pain -resolved  - patient may have had allergic reaction to augmentin on outpatient  - additional concern for possible autoimmune phenomenon post pneumonia with GPCs   - will obtain serology for autoimmune workup    -low dose solumedrol IV daily 40mg  and bendadryl q6h 25mg      Neck pain -resolved   - no grossly appreciable stiffness    - possible related to poor sleep architecture post hospital d/c   - additionally MRI neck with severe C4 foraminal stenosis   - some concern for potential CNS infection - will obtain CSF specimen to rule out meningitis   - empirically on cefepime / vancomycin   - CSF studies are abnormal - in context of recent infection with pneumonia and empyema - will ask neurology and ID to evaluate prior to patient having thoracic surgery   -discussed with neurosurgery - no additional intervention is required for severe spinal foraminal stenosis.  Patient may have surgery and no contraindications for general anesthesia/ RSI      Appreciate collaboration from all providers.    Thank you for allowing me to participate in the care of this patient.  .   Patient/Family are satisfied with care plan and all questions have been answered.    This document was prepared using Dragon voice recognition software and may  include unintentional dictation errors.     Ottie Glazier, M.D.  Division of [place]

## 2019-07-23 NOTE — Progress Notes (Signed)
PROGRESS NOTE    Andrew Myers  [id number] DOB: October 27, 1945 DOA: 07/18/2019 PCP: Derinda Late, MD   Brief Narrative:   Andrew Myers is a 74 y.o. male with medical history significant of hypertension, hyperlipidemia, OSA, BPH, left bundle blockage, recent admission due to right empyema, who presents with fever, joint pain and neck pain.  Patient was recently hospitalized from 5/12-5/21 due to right empyema.  Patient is s/p for thoracentesis and had positive culture for gram-positive bacteria.  Patient is discharged on Augmentin.  Patient states that he developed fever 102.4 yesterday afternoon. He has joint pain in left index finger and right wrist.  The left index finger is red and swelling. He also has left-sided neck pain which is worse on movement from side to side.  CT chest with a small loculated collection in the posterior right chest. MRI neck with evidence of mild spinal canal stenosis and severe left neural foraminal stenosis at C5-6, severe bilateral C4-5 neural foraminal stenosis, and moderate right and severe left C3-4 neural foraminal stenosis.  Subjective: Patient's main concern is his ongoing arthritis.  He had refused to take his Keflex last night and this morning.  He is concerned that this may be secondary to Keflex as he was told that Keflex is related to Augmentin and his serum sickness had previously been secondary to the Augmentin.  I discussed with him my conversations with ID and Pharm.D. and review of up-to-date and note that his oligoarthritis is very unlikely to be secondary to the Keflex.  Patient is willing to take the Keflex.  Assessment & Plan:   Principal Problem:   Empyema of right pleural space (HCC) Active Problems:   Hypertension   Hyperlipidemia   BPH (benign prostatic hyperplasia)   Hypokalemia   Neck pain   Empyema (HCC)   Joint pain  Empyema of right pleural space [facility]):  Status post chest tubes x2 with TPA. Patient is  on Keflex and Flagyl, now willing to restart Keflex after discussion as noted above. Dr Genevive Bi, CT surgery is following ?  Decortication.  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 Patient was seen by rheumatology who recommended steroid taper for 5 to 6 days think this is likely be serum sickness.  Headache Patient status post LP to rule out meningitis  CSF with lymphocytic pleocytosis with normal protein and glucose consistent with aseptic meningitis/serum sickness.  Neck pain MRI Neck with some foraminal stenosis, negative for any infection.   Neurosurgery was consulted by pulmonary and they do not think that he needs any surgical intervention at this time.   No cervical myelopathy.  HTN:  Continue home medications: Amlodipine, Bystolic, irbesartan HCTZ being held.  Hyperlipidemia Continue atorvastatin  BPH (benign prostatic hyperplasia): Continue tamsulosin    Objective: Vitals:   07/22/19 1235 07/22/19 2056 07/23/19 0509 07/23/19 1205  BP: 131/78 (!) 147/78 (!) 150/86 (!) 165/89  Pulse: 72 74 82 71  Resp:  20 20 16   Temp:  97.7 F (36.5 C) 97.8 F (36.6 C) 97.6 F (36.4 C)  TempSrc:  Oral Oral Oral  SpO2:  97% 97% 98%  Weight:      Height:        Intake/Output Summary (Last 24 hours) at 07/23/2019 1737 Last data filed at 07/23/2019 1300 Gross per 24 hour  Intake 118 ml  Output 1550 ml  Net -1432 ml   Filed Weights   07/18/19 0916  Weight: 96.2 kg    Examination:  General  exam: Appears calm and comfortable  Respiratory system: Clear to auscultation, breath sounds at right base, respiratory effort normal. Cardiovascular system: S1 & S2 heard, RRR. No JVD, murmurs, rubs, gallops or clicks. Gastrointestinal system: Soft, nontender, nondistended, bowel sounds positive. Central nervous system: Alert and oriented. No focal neurological deficits.Symmetric 5 x 5 power. Extremities: No edema, no cyanosis, pulses intact and symmetrical. Psychiatry:  Judgement and insight appear normal.    DVT prophylaxis: SCDs Code Status: Full Family Communication: Discussed with patient Disposition Plan:  Status is: Inpatient  Remains inpatient appropriate because:Inpatient level of care appropriate due to severity of illness   Dispo: The patient is from: Home              Anticipated d/c is to: Home              Anticipated d/c date is: 1 day.              Patient currently is not medically stable to d/c.  Consultants:  Thoracic surgery Pulmonology Neurosurgery ID Rheumatology  Procedures:  Antimicrobials:  Cefepime Vancomycin  Data Reviewed: I have personally reviewed following labs and imaging studies  CBC: Recent Labs  Lab 07/18/19 1018 07/19/19 0447 07/19/19 1403 07/22/19 0506 07/23/19 0637  WBC 9.4 14.9* 13.9* 12.2* 13.3*  NEUTROABS 7.5  --  12.3*  --   --   HGB 12.4* 12.1* 12.4* 11.4* 11.8*  HCT 37.1* 36.8* 36.8* 35.5* 36.3*  MCV 83.2 84.0 83.1 85.1 84.8  PLT 279 261 278 372 [id number]*   Basic Metabolic Panel: Recent Labs  Lab 07/18/19 1018 07/19/19 0447 07/19/19 1403 07/23/19 0637  NA 134* 130* 131* 137  K 3.3* 3.3* 3.5 3.7  CL 98 96* 96* 104  CO2 24 21* 24 25  GLUCOSE 152* 161* 161* 117*  BUN 13 10 10 16   CREATININE 0.62 0.69 0.60* 0.51*  CALCIUM 9.2 8.7* 8.6* 8.6*  MG  --  1.5*  --   --    GFR: Estimated Creatinine Clearance: 98.8 mL/min (A) (by C-G formula based on SCr of 0.51 mg/dL (L)). Liver Function Tests: Recent Labs  Lab 07/18/19 1018 07/19/19 1403  AST 28 18  ALT 33 23  ALKPHOS 51 47  BILITOT 1.0 1.2  PROT 8.1 7.6  ALBUMIN 3.3* 3.0*   No results for input(s): LIPASE, AMYLASE in the last 168 hours. No results for input(s): AMMONIA in the last 168 hours. Coagulation Profile: Recent Labs  Lab 07/19/19 0447  INR 1.2   Cardiac Enzymes: Recent Labs  Lab 07/19/19 1535  CKTOTAL 38*   BNP (last 3 results) No results for input(s): PROBNP in the last 8760 hours. HbA1C: No results  for input(s): HGBA1C in the last 72 hours. CBG: Recent Labs  Lab 07/20/19 0807 07/21/19 0800 07/21/19 1150 07/22/19 0807 07/23/19 0757  GLUCAP 146* 138* 228* 104* 106*   Lipid Profile: No results for input(s): CHOL, HDL, LDLCALC, TRIG, CHOLHDL, LDLDIRECT in the last 72 hours. Thyroid Function Tests: No results for input(s): TSH, T4TOTAL, FREET4, T3FREE, THYROIDAB in the last 72 hours. Anemia Panel: No results for input(s): VITAMINB12, FOLATE, FERRITIN, TIBC, IRON, RETICCTPCT in the last 72 hours. Sepsis Labs: Recent Labs  Lab 07/18/19 1018 07/19/19 1403 07/20/19 0537 07/21/19 0416  PROCALCITON <0.10 0.24 0.23 0.16    Recent Results (from the past 240 hour(s))  Blood culture (routine x 2)     Status: None   Collection Time: 07/18/19 10:18 AM   Specimen: BLOOD  Result Value  Ref Range Status   Specimen Description BLOOD LEFT AC  Final   Special Requests   Final    BOTTLES DRAWN AEROBIC AND ANAEROBIC Blood Culture adequate volume   Culture   Final    NO GROWTH 5 DAYS Performed at [facility], [address] [place], [place] [address]    Report Status 07/23/2019 FINAL  Final  Blood culture (routine x 2)     Status: None   Collection Time: 07/18/19 10:19 AM   Specimen: BLOOD  Result Value Ref Range Status   Specimen Description BLOOD LEFT HAND  Final   Special Requests   Final    BOTTLES DRAWN AEROBIC AND ANAEROBIC Blood Culture adequate volume   Culture   Final    NO GROWTH 5 DAYS Performed at [facility], [address], [place], [place] [address]    Report Status 07/23/2019 FINAL  Final  SARS Coronavirus 2 by RT PCR (hospital order, performed in [facility] hospital lab) Nasopharyngeal Nasopharyngeal Swab     Status: None   Collection Time: 07/18/19 12:52 PM   Specimen: Nasopharyngeal Swab  Result Value Ref Range Status   SARS Coronavirus 2 NEGATIVE NEGATIVE Final    Comment: (NOTE) SARS-CoV-2 target nucleic acids are NOT DETECTED. The  SARS-CoV-2 RNA is generally detectable in upper and lower respiratory specimens during the acute phase of infection. The lowest concentration of SARS-CoV-2 viral copies this assay can detect is 250 copies / mL. A negative result does not preclude SARS-CoV-2 infection and should not be used as the sole basis for treatment or other patient management decisions.  A negative result may occur with improper specimen collection / handling, submission of specimen other than nasopharyngeal swab, presence of viral mutation(s) within the areas targeted by this assay, and inadequate number of viral copies (<250 copies / mL). A negative result must be combined with clinical observations, patient history, and epidemiological information. Fact Sheet for Patients:   StrictlyIdeas.no Fact Sheet for Healthcare Providers: BankingDealers.co.za This test is not yet approved or cleared  by the [place] FDA and has been authorized for detection and/or diagnosis of SARS-CoV-2 by FDA under an Emergency Use Authorization (EUA).  This EUA will remain in effect (meaning this test can be used) for the duration of the COVID-19 declaration under Section 564(b)(1) of the Act, 21 U.S.C. section 360bbb-3(b)(1), unless the authorization is terminated or revoked sooner. Performed at [facility], [address] [place], [place] [address]   CSF culture     Status: None   Collection Time: 07/19/19  9:16 AM   Specimen: CSF; Cerebrospinal Fluid  Result Value Ref Range Status   Specimen Description   Final    CSF Performed at [facility], [address] [place], [place] [address]    Special Requests   Final    NONE Performed at [facility], [address] [place], [place] [address]    Gram Stain   Final    WBC SEEN RED BLOOD CELLS NO ORGANISMS SEEN Performed at [facility], [address] [place], [place] [address]     Culture   Final    NO GROWTH 3 DAYS Performed at [facility], [place] [address] [place], [place] [address]    Report Status 07/23/2019 FINAL  Final  Culture, fungus without smear     Status: None (Preliminary result)   Collection Time: 07/19/19  9:18 AM   Specimen: CSF; Cerebrospinal Fluid  Result  Value Ref Range Status   Specimen Description   Final    CSF Performed at [facility], [address], [place], [place] [address]    Special Requests   Final    NONE Performed at [facility], [address], [place], [place] [address]    Culture   Final    NO FUNGUS ISOLATED AFTER 3 DAYS Performed at [facility], [place] [address], [place], [place] [address]    Report Status PENDING  Incomplete  Chlamydia/NGC rt PCR ([place] only)     Status: None   Collection Time: 07/20/19  5:49 PM   Specimen: Urine  Result Value Ref Range Status   Specimen source GC/Chlam URINE, RANDOM  Final   Chlamydia Tr NOT DETECTED NOT DETECTED Final   N gonorrhoeae NOT DETECTED NOT DETECTED Final    Comment: (NOTE) This CT/NG assay has not been evaluated in patients with a history of  hysterectomy. Performed at [facility], [address], [place], [place] [address]      Radiology Studies: No results found.  Scheduled Meds: . amLODipine  5 mg Oral Daily  . aspirin EC  81 mg Oral BID  . atorvastatin  20 mg Oral QHS  . cephALEXin  500 mg Oral Q6H  . irbesartan  150 mg Oral Daily  . loratadine  10 mg Oral Daily  . methylPREDNISolone (SOLU-MEDROL) injection  20 mg Intravenous Daily  . metroNIDAZOLE  500 mg Oral Q8H  . multivitamin with minerals  1 tablet Oral Daily  . nebivolol  5 mg Oral Daily  . polyethylene glycol  17 g Oral Daily  . tamsulosin  0.8 mg Oral QPC supper   Continuous Infusions: . sodium chloride 250 mL (07/21/19 1329)     LOS: 5 days   Time spent: 40 minutes.  Vashti Hey, MD [facility]  If 7PM-7AM, please  contact night-coverage [email or web address]  07/23/2019, 5:37 PM   This record has been created using Systems analyst. Errors have been sought and corrected,but may not always be located. Such creation errors do not reflect on the standard of care.

## 2019-07-24 LAB — MISC LABCORP TEST (SEND OUT): Labcorp test code: 138412

## 2019-07-24 LAB — GLUCOSE, CAPILLARY: Glucose-Capillary: 108 mg/dL — ABNORMAL HIGH (ref 70–99)

## 2019-07-24 LAB — LYME DISEASE DNA BY PCR(BORRELIA BURG): Lyme Disease(B.burgdorferi)PCR: NEGATIVE

## 2019-07-24 MED ORDER — CEPHALEXIN 500 MG PO CAPS: 500.0000 mg | ORAL_CAPSULE | Freq: Four times a day (QID) | ORAL | 0 refills | Status: AC

## 2019-07-24 MED ORDER — PREDNISONE 10 MG (21) PO TBPK
ORAL_TABLET | Freq: Every day | ORAL | 0 refills | Status: DC
Start: 2019-07-24 — End: 2019-08-04

## 2019-07-24 MED ORDER — POLYETHYLENE GLYCOL 3350 17 G PO PACK: 17.0000 g | PACK | Freq: Every day | ORAL | 0 refills | Status: DC

## 2019-07-24 MED ORDER — METRONIDAZOLE 500 MG PO TABS: 500.0000 mg | ORAL_TABLET | Freq: Three times a day (TID) | ORAL | 0 refills | Status: AC

## 2019-07-24 NOTE — Discharge Summary (Signed)
Andrew Myers [name] DOB: [birth year] DOA: 07/18/2019  PCP: Derinda Late, MD  Admit date: 07/18/2019  Discharge date: 07/24/2019  Admitted From: Home   disposition: Home   Recommendations for Outpatient Follow-up:   Follow up with Dr.Aleskerov In 1-2 weeks Follow-up with Dr. Jefm Bryant in 2 weeks   Home Health: N/A Equipment/Devices: N/A Consultations: PCCM, thoracic surgery, rheumatology Discharge Condition: Improved CODE STATUS: Full Diet Recommendation: Heart Healthy   Diet Order            Diet - low sodium heart healthy        Diet Heart Room service appropriate? Yes; Fluid consistency: Thin  Diet effective now               Chief Complaint  Patient presents with  . Allergic Reaction  . Fever  . Joint Swelling     Brief history of present illness from the day of admission and additional interim summary    Andrew Maraldo Wilsonis a 75 y.o.malewith medical history significant ofhypertension, hyperlipidemia, OSA, BPH, left bundle blockage, recent admission due to right empyema, s/p treatment with chest tubes x2 was admitted on 07/18/2019 for fever, joint pain and neck pain.  Patient had been recently hospitalized from 5/12-5/21 due to right empyema when he developed a cough at the end of February and March. Patient was noted to have loculated right-sided infiltrate is s/p thoracentesis which revealed pus in the pleural fluid.  He had to have 2 chest tubes placed due to loculations.  It had been planned for him to have a decortication however he did so well after the chest tubes that that was deferred.  Gram stain from purulent empyema showed GPC however there was no growth on culture so was unable to be speciated.    Patient was discharged and 3 days later developed pain in his knee  and fever and chill.  The following day he had neck pain could hardly turn his neck from side to side.  He then had swelling in the left hand which had erythema and itching.  Joints progressed to pain in both hips both shoulders swelling in the right wrist.  Was admitted.  MRI of the cervical spine did not show abscess.  He had an LP with a few white cells culture negative.  Labs pertinent for white count 13,000.  Normal platelets.  CA CT of the chest showed consult consolidation of the right chest small pleural effusion.  Sed rate 115 uric acid 3 mildly low C3 but normal C4.  CRP 24 rheumatoid factor 17 ANA negative                                                                  Hospital Course   Patient was readmitted for new onset fever and  arthritis.  He was seen by Dr. Jefm Bryant from rheumatology who started him on Solu-Medrol with excellent relief of symptoms.  Symptoms were thought to be secondary to reactive arthritis/serum sickness possibly secondary to fulminant infection or Augmentin.  It was recommended that patient complete a slow taper of prednisone.  Augmentin was changed to Keflex.  Patient has continued to do well from a respiratory standpoint.  He was seen by Dr. Lanney Gins while he was in house with no further changes in his treatment.  Plan is for outpatient follow-up with Dr. Lanney Gins as he does have a small persistent loculated pleural effusion.   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 Patient was seen by rheumatology who recommended steroid taper for 5 to 6 days think this is likely be serum sickness.  Empyema of right pleural space [facility]):  Status post chest tubes x2 with TPA. Patient is on Keflex and Flagyl which will be continued as an outpatient. Follow-up as an outpatient with Dr. Lanney Gins  Headache Patient status post LP to rule out meningitis  CSF with lymphocytic pleocytosis with normal protein and glucose consistent with aseptic meningitis/serum  sickness.  Neck pain MRI Neck with some foraminal stenosis, negative for any infection.   Neurosurgery was consulted by pulmonary and they do not think that he needs any surgical intervention at this time.   No cervical myelopathy.  HTN:  Continue home medications:Amlodipine, Bystolic, irbesartan HCTZ being held.  Hyperlipidemia Continue atorvastatin  BPH (benign prostatic hyperplasia): Continue tamsulosin      Discharge diagnosis     Principal Problem:   Empyema of right pleural space (HCC) Active Problems:   Hypertension   Hyperlipidemia   BPH (benign prostatic hyperplasia)   Hypokalemia   Neck pain   Empyema (HCC)   Joint pain    Discharge instructions    Discharge Instructions    Call MD for:  difficulty breathing, headache or visual disturbances   Complete by: As directed    Call MD for:  extreme fatigue   Complete by: As directed    Call MD for:  redness, tenderness, or signs of infection (pain, swelling, redness, odor or green/yellow discharge around incision site)   Complete by: As directed    Call MD for:  severe uncontrolled pain   Complete by: As directed    Diet - low sodium heart healthy   Complete by: As directed    Discharge instructions   Complete by: As directed    It was pleasure taking care of you. You are being given antibiotics for 10 more days. We also gave you a steroid taper, please take it as directed. If you experience joint pains or swelling again please follow-up with rheumatology.   Discharge instructions   Complete by: As directed    1.  Make sure you understand how to take the Prednisone and take it as prescribed. 2.  You are being sent home on 2 antibiotics, Keflex and Flagyl.  Please take as prescribed. 3.  You will need a follow-up appointment with Dr. Lanney Gins in 2-3 weeks.  Please call his office regarding your appointment. 4.  If your arthritis comes back after you come off of the Prednisone, you will need to  see Dr. Jefm Bryant in rheumatology.  Please call to make an appointment.  Since he has already seen you he knows your situation well.   Increase activity slowly   Complete by: As directed    Increase activity slowly   Complete by: As  directed       Discharge Medications   Allergies as of 07/24/2019      Reactions   Penicillins Other (See Comments)   FEBRILE SERUM SICKNESS WITH POLYARTICULAR ARTHRALGIAS   Codeine Rash      Medication List    STOP taking these medications   amoxicillin-clavulanate 875-125 MG tablet Commonly known as: Augmentin     TAKE these medications   amLODipine 5 MG tablet Commonly known as: NORVASC Take 5 mg by mouth daily.   aspirin EC 81 MG tablet Take 81 mg by mouth 2 (two) times daily.   atorvastatin 20 MG tablet Commonly known as: LIPITOR Take 20 mg by mouth at bedtime.   cephALEXin 500 MG capsule Commonly known as: KEFLEX Take 1 capsule (500 mg total) by mouth every 6 (six) hours for 10 days.   cetirizine 10 MG tablet Commonly known as: ZYRTEC Take 10 mg by mouth daily.   hydrochlorothiazide 25 MG tablet Commonly known as: HYDRODIURIL Take 25 mg by mouth daily.   metroNIDAZOLE 500 MG tablet Commonly known as: FLAGYL Take 1 tablet (500 mg total) by mouth every 8 (eight) hours for 10 days.   Multi-Vitamin tablet Take 1 tablet by mouth daily.   nebivolol 5 MG tablet Commonly known as: BYSTOLIC Take 5 mg by mouth daily.   polyethylene glycol 17 g packet Commonly known as: MIRALAX / GLYCOLAX Take 17 g by mouth daily.   predniSONE 10 MG (21) Tbpk tablet Commonly known as: STERAPRED UNI-PAK 21 TAB Take by mouth daily. Take 3 tablets daily for 2 days, then 2 tablets for another 3 days, and 1 tablet for next 3 days before stopping it.   sildenafil 20 MG tablet Commonly known as: REVATIO Take 20-100 mg by mouth daily as needed (ED).   tamsulosin 0.4 MG Caps capsule Commonly known as: FLOMAX Take 0.8 mg by mouth daily after  supper.   valsartan 160 MG tablet Commonly known as: DIOVAN Take 160 mg by mouth daily.       Follow-up Information    Ottie Glazier, MD. Schedule an appointment as soon as possible for a visit in 2 week(s).   Specialty: Pulmonary Disease Contact information: [place] [address] [phone]        Emmaline Kluver., MD. Call in 2 week(s).   Specialty: Rheumatology Why: If your arthritis gets worse after completing your prednisone course, please call and make an appointment with Dr. Jefm Bryant.  Contact information: [place] [id number] [phone]           Major procedures and Radiology Reports - PLEASE review detailed and final reports thoroughly  -        DG Chest 1 View  Result Date: 07/04/2019 CLINICAL DATA:  Status post right thoracentesis. EXAM: CHEST  1 VIEW COMPARISON:  CT of the chest on 06/29/2019 FINDINGS: The heart size and mediastinal contours are within normal limits. Residual lateral/posterolateral component of loculated pleural fluid remains on the right. No pneumothorax after right thoracentesis. No pulmonary edema. The visualized skeletal structures are unremarkable. IMPRESSION: No pneumothorax after right sided thoracentesis. Residual lateral/posterolateral component of loculated right pleural fluid remains. Electronically Signed   By: Aletta Edouard M.D.   On: 07/04/2019 14:26   DG Chest 2 View  Result Date: 07/18/2019 CLINICAL DATA:  Fever.  Chest tube removal EXAM: CHEST - 2 VIEW COMPARISON:  Jul 13, 2019 FINDINGS: Chest tube has been removed.  No appreciable pneumothorax. There is a small right pleural effusion with ill-defined airspace opacity in the right mid and lower lung zones. Left lung clear. Heart size and pulmonary vascularity are normal. No adenopathy. There is mild degenerative change in the thoracic spine. IMPRESSION: No evident pneumothorax. Small right  pleural effusion with ill-defined opacity in the right mid and lower lung zones, likely combination of loculated pleural effusion and atelectasis. Mild pneumonia cannot be excluded in this area. Lungs elsewhere clear. Cardiac silhouette within normal limits. Electronically Signed   By: Lowella Grip III M.D.   On: 07/18/2019 10:57   DG Chest 2 View  Result Date: 07/12/2019 CLINICAL DATA:  Empyema.  Chest tube present. EXAM: CHEST - 2 VIEW COMPARISON:  07/11/2019 FINDINGS: Right basilar chest tube unchanged. Lungs are somewhat hypoinflated with evidence of a small amount right pleural fluid without significant change. No pneumothorax. Remainder of the lungs are clear. Cardiomediastinal silhouette and remainder of the exam is unchanged. IMPRESSION: Small amount right pleural fluid with right basilar chest tube unchanged. Electronically Signed   By: Marin Olp M.D.   On: 07/12/2019 08:25   CT CHEST WO CONTRAST  Result Date: 07/07/2019 CLINICAL DATA:  Follow-up scan for empyema of the RIGHT lung, chest tube placed 2 days ago EXAM: CT CHEST WITHOUT CONTRAST TECHNIQUE: Multidetector CT imaging of the chest was performed following the standard protocol without IV contrast. COMPARISON:  06/28/2009 and intervening interventional and ultrasound evaluations. FINDINGS: Cardiovascular: Calcified atheromatous plaque in the thoracic aorta similar to prior study. No aneurysmal dilation. Heart size is stable without pericardial effusion. Calcified coronary artery disease as before. Vessels not well assessed given lack of intravenous contrast. Mediastinum/Nodes: No thoracic inlet adenopathy. No axillary lymphadenopathy. No mediastinal adenopathy. Esophagus grossly normal. Lungs/Pleura: Since the previous examination there is been interval placement of a RIGHT-sided chest tube. A small amount of subcutaneous emphysema is noted. The marker for chest tube sideholes is at least 1.5 cm within the RIGHT chest cavity within  the pleural space. Pleural fluid at the RIGHT lung base is perhaps slightly diminished but there is no change in the loculated fluid along the fissure in the RIGHT chest when compared to the prior exam RIGHT basilar consolidative changes and volume loss are similar to the prior study. LEFT chest is clear. Airways are patent. Upper Abdomen: Upper abdominal contents are unremarkable. No acute process in the upper abdomen. Musculoskeletal: Small amount of subcutaneous emphysema along the RIGHT chest wall as discussed. No chest wall mass. No acute bone process. Spinal degenerative change. IMPRESSION: 1. Slightly diminished RIGHT pleural fluid, dominant loculated area along the major fissure in the RIGHT chest is unchanged. 2. Persistent basilar consolidation/volume loss without change. This may represent rounded atelectasis or pneumonia. Follow-up is suggested to ensure there is no underlying mass. 3. Small amount of subcutaneous emphysema along the chest wall. Marker for sideholes is within the pleural space as discussed above. 4. Atherosclerotic changes in the thoracic aorta. Calcified coronary artery disease. 5. Aortic atherosclerosis. Aortic Atherosclerosis (ICD10-I70.0). Electronically Signed   By: Zetta Bills M.D.   On: 07/07/2019 16:33   CT CHEST WO CONTRAST  Result Date: 06/29/2019 CLINICAL DATA:  Pleural effusion, shortness of breath. Follow-up chest x-ray EXAM: CT CHEST WITHOUT CONTRAST TECHNIQUE: Multidetector CT imaging of the chest was performed following the standard protocol without IV contrast. COMPARISON:  Chest x-ray 05/11/2019 FINDINGS: Cardiovascular: Heart is normal size. Aorta normal caliber. Scattered coronary artery and aortic calcifications. Mediastinum/Nodes: No mediastinal, hilar, or  axillary adenopathy. Trachea and esophagus are unremarkable. Thyroid unremarkable. Lungs/Pleura: Partially loculated moderate-sized right pleural effusion noted. Compressive atelectasis or pneumonia in the  right lower lobe. Scattered granulomas in the right lung. Left lung clear. Upper Abdomen: Imaging into the upper abdomen shows no acute findings. Musculoskeletal: Chest wall soft tissues are unremarkable. No acute bony abnormality. IMPRESSION: Moderate partially loculated right pleural effusion. Right lower lobe compressive atelectasis versus pneumonia. Electronically Signed   By: Rolm Baptise M.D.   On: 06/29/2019 14:56   CT Chest W Contrast  Addendum Date: 07/18/2019   ADDENDUM REPORT: 07/18/2019 13:07 ADDENDUM: Enlarged and mildly heterogeneous LEFT hemi thyroid. Recommend thyroid ultrasound (ref: J Am Coll Radiol. 2015 Feb;12(2): 143-50). These results were called by telephone at the time of interpretation on 07/18/2019 at 1:07 pm to provider Duffy Bruce , who verbally acknowledged these results. Electronically Signed   By: Zetta Bills M.D.   On: 07/18/2019 13:07   Result Date: 07/18/2019 CLINICAL DATA:  Chest pain shortness of breath. EXAM: CT CHEST WITH CONTRAST TECHNIQUE: Multidetector CT imaging of the chest was performed during intravenous contrast administration. CONTRAST:  8mL OMNIPAQUE IOHEXOL 300 MG/ML  SOLN COMPARISON:  07/07/2019 FINDINGS: Cardiovascular: Heart size is normal without pericardial effusion. Calcified and noncalcified plaque throughout the thoracic aorta is similar to the prior study. Central pulmonary vasculature is unremarkable on venous phase imaging. Mediastinum/Nodes: Mildly heterogeneous enlargement of the LEFT hemi thyroid. No thoracic inlet adenopathy. No axillary lymphadenopathy. No hilar lymphadenopathy. Lungs/Pleura: Basilar consolidation on the RIGHT with small amount of pleural fluid, diminished since 07/07/2019. Post removal of RIGHT-sided chest tube. Small partially loculated area posterior to consolidated lung in the RIGHT chest measuring approximately 4.3 x 2.2 cm. Unchanged in this location when compared to the previous imaging study. Near complete  resolution of pleural fluid elsewhere in the chest. Improved aeration overall since the study of 07/07/2019 in the RIGHT lower lobe. Area of consolidative change shows distortion of pulmonary vasculature and bronchovascular structures extending to the posterior RIGHT chest. Upper Abdomen: Incidental imaging of upper abdominal contents is unremarkable. Musculoskeletal: Spinal degenerative changes without acute or destructive bone process. IMPRESSION: 1. Basilar consolidation on the RIGHT with small amount of pleural fluid, diminished since 07/07/2019. Post removal of RIGHT-sided chest tube. 2. Small partially loculated area posterior to consolidated lung in the RIGHT chest measuring approximately 4.3 x 2.2 cm, unchanged in this location when compared to the previous imaging study. Compatible with persistent small volume empyema a a 3. Improved aeration at the RIGHT lung base still with an area of consolidative change adjacent to small loculated collection in the posterior RIGHT chest. This may represent partially trapped lung or developing rounded atelectasis. Continued follow-up is suggested. 4. Aortic atherosclerosis. Aortic Atherosclerosis (ICD10-I70.0). Electronically Signed: By: Zetta Bills M.D. On: 07/18/2019 12:22   MR CERVICAL SPINE W WO CONTRAST  Result Date: 07/19/2019 CLINICAL DATA:  Left-sided neck pain EXAM: MRI CERVICAL SPINE WITHOUT AND WITH CONTRAST TECHNIQUE: Multiplanar and multiecho pulse sequences of the cervical spine, to include the craniocervical junction and cervicothoracic junction, were obtained without and with intravenous contrast. CONTRAST:  48mL GADAVIST GADOBUTROL 1 MMOL/ML IV SOLN COMPARISON:  None. FINDINGS: Alignment: Physiologic. Vertebrae: No fracture, evidence of discitis, or bone lesion. Cord: Normal signal and morphology. Posterior Fossa, vertebral arteries, paraspinal tissues: Negative. Disc levels: C1-2: Unremarkable. C2-3: Normal disc space and facet joints. There is no  spinal canal stenosis. No neural foraminal stenosis. C3-4: Small disc bulge with bilateral uncovertebral hypertrophy. There  is no spinal canal stenosis. Moderate right and severe left neural foraminal stenosis. C4-5: Small disc bulge with right-greater-than-left facet hypertrophy and uncovertebral spurring. There is no spinal canal stenosis. Severe bilateral neural foraminal stenosis. C5-6: Disc space narrowing with intermediate disc osteophyte complex. Mild spinal canal stenosis. Mild right and severe left neural foraminal stenosis. C6-7: Small disc bulge. There is no spinal canal stenosis. No neural foraminal stenosis. C7-T1: Normal disc space and facet joints. There is no spinal canal stenosis. No neural foraminal stenosis. IMPRESSION: 1. Mild spinal canal stenosis and severe left neural foraminal stenosis at C5-6. 2. Severe bilateral C4-5 neural foraminal stenosis. 3. Moderate right and severe left C3-4 neural foraminal stenosis. Electronically Signed   By: Ulyses Jarred M.D.   On: 07/19/2019 01:48   DG Chest Port 1 View  Result Date: 07/13/2019 CLINICAL DATA:  Empyema with chest tube present EXAM: PORTABLE CHEST 1 VIEW COMPARISON:  Jul 12, 2019 FINDINGS: Stable chest tube positioning on the right. No pneumothorax. Small right pleural effusion with right base atelectasis present. Lungs elsewhere clear. Heart size and pulmonary vascularity are normal. No adenopathy. There is degenerative change in the thoracic spine. IMPRESSION: Stable chest tube positioning without pneumothorax. Small right pleural effusion with right base atelectasis present. Lungs elsewhere clear. Stable cardiac silhouette. Electronically Signed   By: Lowella Grip III M.D.   On: 07/13/2019 09:31   DG Chest Port 1 View  Result Date: 07/11/2019 CLINICAL DATA:  Empyema. EXAM: PORTABLE CHEST 1 VIEW COMPARISON:  Yesterday FINDINGS: Additional right-sided pleural catheter placed in the interim. There is improved aeration at the right  base with decreased pleural fluid. Normal heart size. No visible pneumothorax. IMPRESSION: Improved right base aeration after additional catheter placement. Electronically Signed   By: Monte Fantasia M.D.   On: 07/11/2019 08:56   DG Chest Port 1 View  Result Date: 07/10/2019 CLINICAL DATA:  Atelectasis EXAM: PORTABLE CHEST 1 VIEW COMPARISON:  Portable exam at 0743 hrs compared to 07/09/2019 FINDINGS: Upper-normal size of cardiac silhouette. Mediastinal contours and pulmonary vascularity normal. Persistent RIGHT pleural effusion and basilar atelectasis. LEFT lung clear. No acute infiltrate or pneumothorax. Endplate spur formation thoracic spine, mild. IMPRESSION: Persistent RIGHT pleural effusion and basilar atelectasis. Electronically Signed   By: Lavonia Dana M.D.   On: 07/10/2019 08:20   DG Chest Port 1 View  Result Date: 07/09/2019 CLINICAL DATA:  Empyema, RIGHT-sided chest tube in place. EXAM: PORTABLE CHEST 1 VIEW COMPARISON:  Chest x-ray dated 07/07/2019. FINDINGS: RIGHT basilar chest tube appears stable in position. The overlying RIGHT pleural fluid is stable in extent. LEFT lung remains clear. No pneumothorax is seen. Heart size and mediastinal contours are stable. IMPRESSION: Stable chest x-ray. RIGHT-sided chest tube is stable in position. The overlying RIGHT pleural fluid is stable in extent. Electronically Signed   By: Franki Cabot M.D.   On: 07/09/2019 10:10   DG Chest Port 1 View  Result Date: 07/07/2019 CLINICAL DATA:  Follow-up right chest tube. EXAM: PORTABLE CHEST 1 VIEW COMPARISON:  07/06/2018 FINDINGS: Chest tube remains in place at the right lung base. There is slightly less pleural fluid than was seen yesterday. No pleural air is visible. Some pleural fluid does persist, with right lower lung atelectasis. Left hemithorax appears clear. IMPRESSION: Persistent right chest tube. Slightly less right pleural fluid and less pleural air. Moderate amount of pleural fluid does persist with  lower right lung atelectasis. Electronically Signed   By: Nelson Chimes M.D.   On: 07/07/2019  11:55   DG Chest Port 1 View  Result Date: 07/06/2019 CLINICAL DATA:  Right empyema. EXAM: PORTABLE CHEST 1 VIEW COMPARISON:  Chest x-ray dated Jul 04, 2019. FINDINGS: New right pigtail chest tube with unchanged small loculated pleural effusion. The left lung is clear. No pneumothorax. The heart size and mediastinal contours are within normal limits. Normal pulmonary vascularity. No acute osseous abnormality. IMPRESSION: 1. New right-sided chest tube with unchanged small loculated pleural effusion. No pneumothorax. Electronically Signed   By: Titus Dubin M.D.   On: 07/06/2019 09:17   DG Hand Complete Left  Result Date: 07/18/2019 CLINICAL DATA:  Left hand pain and swelling described over the index finger MCP joint. EXAM: LEFT HAND - COMPLETE 3+ VIEW COMPARISON:  None. FINDINGS: No fracture or bone lesion. Mild asymmetric joint space narrowing of several of the interphalangeal joints. There is dorsal marginal spurring at the PIP joints of the second, third and fourth fingers. MCP joints are relatively well maintained. No erosions. Soft tissue prominence is noted adjacent to the spurring at the PIP joints of the second, third and fourth fingers, and along the proximal aspect of the index finger. IMPRESSION: 1. No fracture or dislocation. 2. Arthropathic changes involving the interphalangeal joints most evident at the PIP joints of the second, third and fourth fingers. Soft tissue swelling is most prominent at the base of the index finger. No joint erosions. Electronically Signed   By: Lajean Manes M.D.   On: 07/18/2019 10:26   CT IMAGE GUIDED DRAINAGE BY PERCUTANEOUS CATHETER  Result Date: 07/10/2019 INDICATION: 74 year old with a loculated right pleural effusion. Patient already has one right chest tube but there is residual right pleural fluid and request for a second chest tube placement. EXAM: CT-GUIDED  RIGHT CHEST TUBE PLACEMENT MEDICATIONS: Moderate sedation ANESTHESIA/SEDATION: 1.0 mg IV Versed 50 mcg IV Fentanyl Moderate Sedation Time:  18 minutes The patient was continuously monitored during the procedure by the interventional radiology nurse under my direct supervision. COMPLICATIONS: None immediate. TECHNIQUE: Informed written consent was obtained from the patient after a thorough discussion of the procedural risks, benefits and alternatives. All questions were addressed. A timeout was performed prior to the initiation of the procedure. PROCEDURE: Patient was placed prone on the CT scanner. Pocket of fluid along the posterior aspect of the right chest was identified and targeted. The right posterior chest was prepped with chlorhexidine and sterile field was created. Skin and soft tissues were anesthetized with 1% lidocaine. Using CT guidance, a Yueh catheter was directed into the pleural space and yellow fluid was aspirated. Superstiff Amplatz wire was advanced into the pleural space. The tract was dilated to accommodate a 14 [place] multipurpose drain. Small amount of additional pleural fluid was aspirated. Chest tube was attached to the drainage system. Catheter was sutured to skin and a dressing was placed. FINDINGS: Loculated right pleural fluid. Pocket along the right posterior aspect of the chest was identified and targeted for drain placement. 24 French drain was successfully placed in the right pleural space. Small amount of yellow fluid was removed. Pocket of pleural air noted following chest tube placement compatible with the loculated nature of the pleural effusion. IMPRESSION: CT-guided placement of a right chest tube. Right pleural fluid is loculated. Electronically Signed   By: Markus Daft M.D.   On: 07/10/2019 13:35   CT IMAGE GUIDED DRAINAGE BY PERCUTANEOUS CATHETER  Result Date: 07/05/2019 INDICATION: Right empyema EXAM: CT-GUIDED PLACEMENT RIGHT 10 FRENCH CHEST TUBE MEDICATIONS: The  patient is  currently admitted to the hospital and receiving intravenous antibiotics. The antibiotics were administered within an appropriate time frame prior to the initiation of the procedure. ANESTHESIA/SEDATION: Fentanyl 50 mcg IV; Versed 1.0 mg IV Moderate Sedation Time:  11 MINUTES The patient was continuously monitored during the procedure by the interventional radiology nurse under my direct supervision. COMPLICATIONS: None immediate. PROCEDURE: Informed written consent was obtained from the patient after a thorough discussion of the procedural risks, benefits and alternatives. All questions were addressed. Maximal Sterile Barrier Technique was utilized including caps, mask, sterile gowns, sterile gloves, sterile drape, hand hygiene and skin antiseptic. A timeout was performed prior to the initiation of the procedure. Previous imaging reviewed. Patient positioned right anterior oblique. Noncontrast localization CT performed. The loculated right effusion was localized and marked for a lateral posterior approach. Under sterile conditions and local anesthesia, 18 gauge introducer needle was advanced from a posterolateral lower intercostal approach into the pleural fluid. Needle position confirmed with CT. Guidewire inserted followed by tract dilatation insert a 10 French drain. Drain catheter position confirmed with CT. Syringe aspiration yielded 10 cc of thick mucinous exudative fluid. Sample sent for pan culture. Catheter secured with Prolene suture and connected to external pleura vac. Sterile dressing applied. No immediate complication. Patient tolerated the procedure well. IMPRESSION: Successful CT-guided 10 French right chest tube insertion. Electronically Signed   By: Jerilynn Mages.  Shick M.D.   On: 07/05/2019 15:44   US THORACENTESIS ASP PLEURAL SPACE W/IMG GUIDE  Result Date: 07/04/2019 CLINICAL DATA:  Loculated right pleural effusion. EXAM: ULTRASOUND GUIDED RIGHT THORACENTESIS COMPARISON:  CT of the chest on  06/29/2019 PROCEDURE: An ultrasound guided thoracentesis was thoroughly discussed with the patient and questions answered. The benefits, risks, alternatives and complications were also discussed. The patient understands and wishes to proceed with the procedure. Written consent was obtained. Ultrasound was performed to localize and mark an adequate pocket of fluid in the right chest. The area was then prepped and draped in the normal sterile fashion. 1% Lidocaine was used for local anesthesia. Under ultrasound guidance a 6 French Safe-T-Centesis catheter was introduced. Fluid was removed from the catheter. The catheter was then removed. Under ultrasound guidance, a 5 [place] Yueh centesis catheter was then introduced in a slightly higher pocket of pleural fluid in the right posterior chest. Aspiration was performed through the centesis catheter. The catheter was removed and a dressing applied. FINDINGS: Ultrasound shows a very complex appearing and loculated right pleural effusion with relatively small components posteriorly. From the first pocket sampled in the right posterior pleural space towards the lung base, only 10 mL of clear appearing amber fluid was able to be aspirated. The sample was sent for various requested labs. Decision was made to sample a second slightly higher pocket yielding 5 mL of grossly purulent whitish green thick fluid. This sample was sent for culture analysis. IMPRESSION: Highly loculated and complex right pleural effusion with relatively small pockets posteriorly. The first pocket yielded 10 mL of clear appearing amber fluid. The second more superior pocket yielded 5 mL of grossly purulent fluid likely consistent with empyema. Fluid analysis will be performed on both samples. Electronically Signed   By: Aletta Edouard M.D.   On: 07/04/2019 14:24   DG Lumbar Puncture Fluoro Guide  Result Date: 07/19/2019 CLINICAL DATA:  Neck pain, possible meningitis EXAM: DIAGNOSTIC LUMBAR PUNCTURE  UNDER FLUOROSCOPIC GUIDANCE FLUOROSCOPY TIME:  Fluoroscopy Time:  4 minutes 42 seconds Radiation Exposure Index (if provided by the fluoroscopic device): 150 mGy  Number of Acquired Spot Images: 2 PROCEDURE: Informed consent was obtained from the patient prior to the procedure, including potential complications of headache, allergy, and pain. With the patient prone, the lower back was prepped with Betadine. 1% Lidocaine was used for local anesthesia. Lumbar puncture was performed at the L3 level using a 20 gauge needle with return of clear CSF with an opening pressure of 15 cm water. Sixteen ml of CSF were obtained for laboratory studies. The patient tolerated the procedure well and there were no apparent complications. IMPRESSION: Successful fluoro guided lumbar puncture for fluid. Electronically Signed   By: Inez Catalina M.D.   On: 07/19/2019 12:34    Micro Results    Recent Results (from the past 240 hour(s))  Blood culture (routine x 2)     Status: None   Collection Time: 07/18/19 10:18 AM   Specimen: BLOOD  Result Value Ref Range Status   Specimen Description BLOOD LEFT [facility]  Final   Special Requests   Final    BOTTLES DRAWN AEROBIC AND ANAEROBIC Blood Culture adequate volume   Culture   Final    NO GROWTH 5 DAYS Performed at [facility], [address], [place], [place] [address]    Report Status 07/23/2019 FINAL  Final  Blood culture (routine x 2)     Status: None   Collection Time: 07/18/19 10:19 AM   Specimen: BLOOD  Result Value Ref Range Status   Specimen Description BLOOD LEFT HAND  Final   Special Requests   Final    BOTTLES DRAWN AEROBIC AND ANAEROBIC Blood Culture adequate volume   Culture   Final    NO GROWTH 5 DAYS Performed at [facility], [address], [place], [place] [address]    Report Status 07/23/2019 FINAL  Final  SARS Coronavirus 2 by RT PCR (hospital order, performed in [facility] hospital lab) Nasopharyngeal Nasopharyngeal Swab      Status: None   Collection Time: 07/18/19 12:52 PM   Specimen: Nasopharyngeal Swab  Result Value Ref Range Status   SARS Coronavirus 2 NEGATIVE NEGATIVE Final    Comment: (NOTE) SARS-CoV-2 target nucleic acids are NOT DETECTED. The SARS-CoV-2 RNA is generally detectable in upper and lower respiratory specimens during the acute phase of infection. The lowest concentration of SARS-CoV-2 viral copies this assay can detect is 250 copies / mL. A negative result does not preclude SARS-CoV-2 infection and should not be used as the sole basis for treatment or other patient management decisions.  A negative result may occur with improper specimen collection / handling, submission of specimen other than nasopharyngeal swab, presence of viral mutation(s) within the areas targeted by this assay, and inadequate number of viral copies (<250 copies / mL). A negative result must be combined with clinical observations, patient history, and epidemiological information. Fact Sheet for Patients:   StrictlyIdeas.no Fact Sheet for Healthcare Providers: BankingDealers.co.za This test is not yet approved or cleared  by the [place] FDA and has been authorized for detection and/or diagnosis of SARS-CoV-2 by FDA under an Emergency Use Authorization (EUA).  This EUA will remain in effect (meaning this test can be used) for the duration of the COVID-19 declaration under Section 564(b)(1) of the Act, 21 U.S.C. section 360bbb-3(b)(1), unless the authorization is terminated or revoked sooner. Performed at [facility], [address] [place], [place] [address]   CSF culture     Status: None   Collection Time: 07/19/19  9:16 AM  Specimen: CSF; Cerebrospinal Fluid  Result Value Ref Range Status   Specimen Description   Final    CSF Performed at [facility], [address], [place], [place] [address]    Special Requests   Final     NONE Performed at [facility], [address] [place], [place] [address]    Gram Stain   Final    WBC SEEN RED BLOOD CELLS NO ORGANISMS SEEN Performed at [facility], [address], [place], [place] [address]    Culture   Final    NO GROWTH 3 DAYS Performed at [facility], [place] [address] [place], [place] [address]    Report Status 07/23/2019 FINAL  Final  Culture, fungus without smear     Status: None (Preliminary result)   Collection Time: 07/19/19  9:18 AM   Specimen: CSF; Cerebrospinal Fluid  Result Value Ref Range Status   Specimen Description   Final    CSF Performed at [facility], [address], [place], [place] [address]    Special Requests   Final    NONE Performed at [facility], [address], [place], [place] [address]    Culture   Final    No Fungi Isolated in 4 Weeks Performed at [facility], [place] [address] [place], [place] [address]    Report Status PENDING  Incomplete  Chlamydia/NGC rt PCR ([place] only)     Status: None   Collection Time: 07/20/19  5:49 PM   Specimen: Urine  Result Value Ref Range Status   Specimen source GC/Chlam URINE, RANDOM  Final   Chlamydia Tr NOT DETECTED NOT DETECTED Final   N gonorrhoeae NOT DETECTED NOT DETECTED Final    Comment: (NOTE) This CT/NG assay has not been evaluated in patients with a history of  hysterectomy. Performed at [facility], [address] [place], [place] [address]     Today   Subjective    Andrew Myers feels much improved since admission.  Feels ready to go home.  Denies change in baseline shortness of breath or abdominal pain.  Notes arthritis feels much improved as of today.  Objective   Blood pressure (!) 147/83, pulse 72, temperature 97.7 F (36.5 C), temperature source Oral, resp. rate 16, height 5\' 11"  (1.803 m), weight 96.2 kg, SpO2 97 %.   Intake/Output Summary (Last 24 hours) at 07/24/2019 1233 Last data filed  at 07/24/2019 1056 Gross per 24 hour  Intake 40 ml  Output 2650 ml  Net -2610 ml    Exam General: Patient appears well and in good spirits sitting up in bed in no acute distress.  Eyes: sclera anicteric, conjuctiva mild injection bilaterally CVS: S1-S2, regular  Respiratory:  decreased air entry bilaterally secondary to decreased inspiratory effort, rales at bases  GI: NABS, soft, NT  LE: No edema.  Neuro: A/O x 3, Moving all extremities equally with normal strength, CN 3-12 intact, grossly nonfocal.  Psych: patient is logical and coherent, judgement and insight appear normal, mood and affect appropriate to situation.    Data Review   CBC w Diff:  Lab Results  Component Value Date   WBC 13.3 (H) 07/23/2019   HGB 11.8 (L) 07/23/2019   HCT 36.3 (L) 07/23/2019   PLT 403 (H) 07/23/2019   LYMPHOPCT 5 07/19/2019   MONOPCT 6 07/19/2019   EOSPCT 0 07/19/2019   BASOPCT 0 07/19/2019    CMP:  Lab Results  Component  Value Date   NA 137 07/23/2019   K 3.7 07/23/2019   CL 104 07/23/2019   CO2 25 07/23/2019   BUN 16 07/23/2019   CREATININE 0.51 (L) 07/23/2019   PROT 7.6 07/19/2019   ALBUMIN 3.0 (L) 07/19/2019   BILITOT 1.2 07/19/2019   ALKPHOS 47 07/19/2019   AST 18 07/19/2019   ALT 23 07/19/2019  .   Total Time in preparing paper work, data evaluation and todays exam - 35 minutes  Vashti Hey M.D on 07/24/2019 at 12:33 PM  [facility]   Office  [phone]

## 2019-07-24 NOTE — Care Management Important Message (Signed)
Important Message  Patient Details  Name: Andrew Myers MRN: [id number] Date of Birth: February 12, 1946   Medicare Important Message Given:  Yes     Dannette Barbara 07/24/2019, 10:45 AM

## 2019-07-24 NOTE — Progress Notes (Signed)
Discharge instructions and medication details reviewed with patient. All questions answered. Pt verbalized understanding. Prescription anbx already picked up by pt's sister.  IV removed. Patient escorted out via wheelchair.

## 2019-07-26 DIAGNOSIS — J869 Pyothorax without fistula: Secondary | ICD-10-CM | POA: Diagnosis not present

## 2019-07-27 DIAGNOSIS — Z872 Personal history of diseases of the skin and subcutaneous tissue: Secondary | ICD-10-CM | POA: Diagnosis not present

## 2019-07-27 DIAGNOSIS — L118 Other specified acantholytic disorders: Secondary | ICD-10-CM | POA: Diagnosis not present

## 2019-07-27 DIAGNOSIS — Z85828 Personal history of other malignant neoplasm of skin: Secondary | ICD-10-CM | POA: Diagnosis not present

## 2019-07-27 DIAGNOSIS — B372 Candidiasis of skin and nail: Secondary | ICD-10-CM | POA: Diagnosis not present

## 2019-07-27 DIAGNOSIS — Z86018 Personal history of other benign neoplasm: Secondary | ICD-10-CM | POA: Diagnosis not present

## 2019-07-27 DIAGNOSIS — L578 Other skin changes due to chronic exposure to nonionizing radiation: Secondary | ICD-10-CM | POA: Diagnosis not present

## 2019-07-27 DIAGNOSIS — D485 Neoplasm of uncertain behavior of skin: Secondary | ICD-10-CM | POA: Diagnosis not present

## 2019-08-04 ENCOUNTER — Ambulatory Visit
Admission: RE | Admit: 2019-08-04 | Discharge: 2019-08-04 | Disposition: A | Discharge status: Home / Self Care | Payer: PPO | Source: Ambulatory Visit | Attending: Cardiothoracic Surgery | Admitting: Cardiothoracic Surgery

## 2019-08-04 ENCOUNTER — Other Ambulatory Visit: Payer: Self-pay

## 2019-08-04 ENCOUNTER — Encounter: Payer: Self-pay | Admitting: Cardiothoracic Surgery

## 2019-08-04 ENCOUNTER — Ambulatory Visit: Payer: PPO | Admitting: Cardiothoracic Surgery

## 2019-08-04 ENCOUNTER — Ambulatory Visit
Admission: RE | Admit: 2019-08-04 | Discharge: 2019-08-04 | Disposition: A | Discharge status: Home / Self Care | Payer: PPO | Attending: Cardiothoracic Surgery | Admitting: Cardiothoracic Surgery

## 2019-08-04 VITALS — BP 147/83 | HR 80 | Temp 97.5°F | Resp 12 | Ht 70.0 in | Wt 212.0 lb

## 2019-08-04 DIAGNOSIS — J869 Pyothorax without fistula: Secondary | ICD-10-CM | POA: Insufficient documentation

## 2019-08-04 NOTE — Progress Notes (Signed)
Andrew Myers Follow Up Note  Patient ID: Andrew Myers, male   DOB: 11/06/1945, 74 y.o.   MRN: [id number]  HISTORY: Andrew Myers returns today. He has had no fever since his hospital discharge approximately 2 weeks ago. He has no symptoms of shortness of breath or cough.    Vitals:   08/04/19 0854  BP: (!) 147/83  Pulse: 80  Resp: 12  Temp: (!) 97.5 F (36.4 C)  SpO2: 96%     EXAM:  Resp: Lungs are clear bilaterally.  No respiratory distress, normal effort. Heart:  Regular without murmurs Abd:  Abdomen is soft, non distended and non tender. No masses are palpable.  There is no rebound and no guarding.  Neurological: Alert and oriented to person, place, and time. Coordination normal.  Skin: Skin is warm and dry. No rash noted. No diaphoretic. No erythema. No pallor.  Psychiatric: Normal mood and affect. Normal behavior. Judgment and thought content normal.      ASSESSMENT: Did have a chest x-ray made today which have independently reviewed. I see continued clearing of the right pleural effusion and pulmonary infiltrate.   PLAN:   Overall I did not see any need for continued surgical surveillance. He is scheduled to follow-up with Dr. Lanney Gins in 3 months.    Nestor Lewandowsky, MD

## 2019-08-04 NOTE — Progress Notes (Signed)
Patient sent to [facility] to have chest xray prior to his office visit.

## 2019-08-04 NOTE — Patient Instructions (Signed)
Please call our office if you have any questions or concerns.  

## 2019-08-08 LAB — FUNGUS CULTURE RESULT

## 2019-08-08 LAB — FUNGAL ORGANISM REFLEX

## 2019-08-08 LAB — FUNGUS CULTURE WITH STAIN

## 2019-08-09 DIAGNOSIS — G4733 Obstructive sleep apnea (adult) (pediatric): Secondary | ICD-10-CM | POA: Diagnosis not present

## 2019-08-09 LAB — FUNGUS CULTURE WITH STAIN

## 2019-08-09 LAB — FUNGAL ORGANISM REFLEX

## 2019-08-09 LAB — FUNGUS CULTURE RESULT

## 2019-08-10 LAB — CULTURE, FUNGUS WITHOUT SMEAR

## 2019-08-11 DIAGNOSIS — M170 Bilateral primary osteoarthritis of knee: Secondary | ICD-10-CM | POA: Diagnosis not present

## 2019-08-11 DIAGNOSIS — M25562 Pain in left knee: Secondary | ICD-10-CM | POA: Diagnosis not present

## 2019-08-11 DIAGNOSIS — M2550 Pain in unspecified joint: Secondary | ICD-10-CM | POA: Diagnosis not present

## 2019-08-11 DIAGNOSIS — M25561 Pain in right knee: Secondary | ICD-10-CM | POA: Diagnosis not present

## 2019-08-11 DIAGNOSIS — Z1159 Encounter for screening for other viral diseases: Secondary | ICD-10-CM | POA: Diagnosis not present

## 2019-08-11 DIAGNOSIS — G8929 Other chronic pain: Secondary | ICD-10-CM | POA: Diagnosis not present

## 2019-08-17 DIAGNOSIS — M542 Cervicalgia: Secondary | ICD-10-CM | POA: Diagnosis not present

## 2019-08-18 LAB — ACID FAST CULTURE WITH REFLEXED SENSITIVITIES (MYCOBACTERIA): Acid Fast Culture: NEGATIVE

## 2019-08-18 LAB — FUNGUS CULTURE RESULT

## 2019-08-18 LAB — FUNGUS CULTURE WITH STAIN

## 2019-08-18 LAB — FUNGAL ORGANISM REFLEX

## 2019-08-19 LAB — ACID FAST CULTURE WITH REFLEXED SENSITIVITIES (MYCOBACTERIA): Acid Fast Culture: NEGATIVE

## 2019-09-11 DIAGNOSIS — T8069XD Other serum reaction due to other serum, subsequent encounter: Secondary | ICD-10-CM | POA: Diagnosis not present

## 2019-09-11 DIAGNOSIS — M170 Bilateral primary osteoarthritis of knee: Secondary | ICD-10-CM | POA: Diagnosis not present

## 2019-09-11 DIAGNOSIS — R768 Other specified abnormal immunological findings in serum: Secondary | ICD-10-CM | POA: Diagnosis not present

## 2019-10-19 DIAGNOSIS — M25562 Pain in left knee: Secondary | ICD-10-CM | POA: Diagnosis not present

## 2019-10-19 DIAGNOSIS — M25561 Pain in right knee: Secondary | ICD-10-CM | POA: Diagnosis not present

## 2019-10-19 DIAGNOSIS — M1711 Unilateral primary osteoarthritis, right knee: Secondary | ICD-10-CM | POA: Diagnosis not present

## 2019-10-19 DIAGNOSIS — M1712 Unilateral primary osteoarthritis, left knee: Secondary | ICD-10-CM | POA: Diagnosis not present

## 2019-10-27 DIAGNOSIS — E7800 Pure hypercholesterolemia, unspecified: Secondary | ICD-10-CM | POA: Diagnosis not present

## 2019-10-27 DIAGNOSIS — Z125 Encounter for screening for malignant neoplasm of prostate: Secondary | ICD-10-CM | POA: Diagnosis not present

## 2019-10-27 DIAGNOSIS — E119 Type 2 diabetes mellitus without complications: Secondary | ICD-10-CM | POA: Diagnosis not present

## 2019-10-27 DIAGNOSIS — Z79899 Other long term (current) drug therapy: Secondary | ICD-10-CM | POA: Diagnosis not present

## 2019-10-31 DIAGNOSIS — R972 Elevated prostate specific antigen [PSA]: Secondary | ICD-10-CM | POA: Diagnosis not present

## 2019-10-31 DIAGNOSIS — N5201 Erectile dysfunction due to arterial insufficiency: Secondary | ICD-10-CM | POA: Diagnosis not present

## 2019-11-02 DIAGNOSIS — J189 Pneumonia, unspecified organism: Secondary | ICD-10-CM | POA: Diagnosis not present

## 2019-11-03 DIAGNOSIS — E7800 Pure hypercholesterolemia, unspecified: Secondary | ICD-10-CM | POA: Diagnosis not present

## 2019-11-03 DIAGNOSIS — I10 Essential (primary) hypertension: Secondary | ICD-10-CM | POA: Diagnosis not present

## 2019-11-03 DIAGNOSIS — Z0000 Encounter for general adult medical examination without abnormal findings: Secondary | ICD-10-CM | POA: Diagnosis not present

## 2019-11-03 DIAGNOSIS — E041 Nontoxic single thyroid nodule: Secondary | ICD-10-CM | POA: Diagnosis not present

## 2019-11-03 DIAGNOSIS — R972 Elevated prostate specific antigen [PSA]: Secondary | ICD-10-CM | POA: Diagnosis not present

## 2019-11-03 DIAGNOSIS — E119 Type 2 diabetes mellitus without complications: Secondary | ICD-10-CM | POA: Diagnosis not present

## 2019-11-03 DIAGNOSIS — Z1331 Encounter for screening for depression: Secondary | ICD-10-CM | POA: Diagnosis not present

## 2019-11-07 DIAGNOSIS — G4733 Obstructive sleep apnea (adult) (pediatric): Secondary | ICD-10-CM | POA: Diagnosis not present

## 2019-12-08 DIAGNOSIS — Z86010 Personal history of colonic polyps: Secondary | ICD-10-CM | POA: Diagnosis not present

## 2019-12-14 DIAGNOSIS — M1711 Unilateral primary osteoarthritis, right knee: Secondary | ICD-10-CM | POA: Diagnosis not present

## 2019-12-14 DIAGNOSIS — M1712 Unilateral primary osteoarthritis, left knee: Secondary | ICD-10-CM | POA: Diagnosis not present

## 2019-12-15 DIAGNOSIS — E041 Nontoxic single thyroid nodule: Secondary | ICD-10-CM | POA: Diagnosis not present

## 2019-12-17 DIAGNOSIS — M1711 Unilateral primary osteoarthritis, right knee: Secondary | ICD-10-CM | POA: Insufficient documentation

## 2019-12-17 DIAGNOSIS — M1712 Unilateral primary osteoarthritis, left knee: Secondary | ICD-10-CM | POA: Insufficient documentation

## 2020-01-05 ENCOUNTER — Other Ambulatory Visit: Payer: Self-pay

## 2020-01-05 ENCOUNTER — Other Ambulatory Visit
Admission: RE | Admit: 2020-01-05 | Discharge: 2020-01-05 | Disposition: A | Discharge status: Home / Self Care | Payer: PPO | Source: Ambulatory Visit | Attending: Gastroenterology | Admitting: Gastroenterology

## 2020-01-05 DIAGNOSIS — Z01812 Encounter for preprocedural laboratory examination: Secondary | ICD-10-CM | POA: Diagnosis not present

## 2020-01-05 DIAGNOSIS — Z20822 Contact with and (suspected) exposure to covid-19: Secondary | ICD-10-CM | POA: Diagnosis not present

## 2020-01-06 LAB — SARS CORONAVIRUS 2 (TAT 6-24 HRS): SARS Coronavirus 2: NEGATIVE

## 2020-01-08 ENCOUNTER — Encounter: Payer: Self-pay | Admitting: *Deleted

## 2020-01-09 ENCOUNTER — Ambulatory Visit: Payer: PPO | Admitting: Anesthesiology

## 2020-01-09 ENCOUNTER — Ambulatory Visit
Admission: RE | Admit: 2020-01-09 | Discharge: 2020-01-09 | Disposition: A | Discharge status: Home / Self Care | Payer: PPO | Attending: Gastroenterology | Admitting: Gastroenterology

## 2020-01-09 ENCOUNTER — Encounter
Admission: RE | Disposition: A | Discharge status: Home / Self Care | Payer: Self-pay | Source: Home / Self Care | Attending: Gastroenterology

## 2020-01-09 ENCOUNTER — Other Ambulatory Visit: Payer: Self-pay

## 2020-01-09 ENCOUNTER — Encounter: Payer: Self-pay | Admitting: *Deleted

## 2020-01-09 DIAGNOSIS — Z7982 Long term (current) use of aspirin: Secondary | ICD-10-CM | POA: Insufficient documentation

## 2020-01-09 DIAGNOSIS — Z1211 Encounter for screening for malignant neoplasm of colon: Secondary | ICD-10-CM | POA: Diagnosis not present

## 2020-01-09 DIAGNOSIS — Z86010 Personal history of colonic polyps: Secondary | ICD-10-CM | POA: Diagnosis not present

## 2020-01-09 DIAGNOSIS — D122 Benign neoplasm of ascending colon: Secondary | ICD-10-CM | POA: Diagnosis not present

## 2020-01-09 DIAGNOSIS — K5730 Diverticulosis of large intestine without perforation or abscess without bleeding: Secondary | ICD-10-CM | POA: Diagnosis not present

## 2020-01-09 DIAGNOSIS — Z880 Allergy status to penicillin: Secondary | ICD-10-CM | POA: Diagnosis not present

## 2020-01-09 DIAGNOSIS — K635 Polyp of colon: Secondary | ICD-10-CM | POA: Diagnosis not present

## 2020-01-09 DIAGNOSIS — Z881 Allergy status to other antibiotic agents status: Secondary | ICD-10-CM | POA: Insufficient documentation

## 2020-01-09 DIAGNOSIS — K640 First degree hemorrhoids: Secondary | ICD-10-CM | POA: Diagnosis not present

## 2020-01-09 DIAGNOSIS — Z791 Long term (current) use of non-steroidal anti-inflammatories (NSAID): Secondary | ICD-10-CM | POA: Diagnosis not present

## 2020-01-09 DIAGNOSIS — Z79899 Other long term (current) drug therapy: Secondary | ICD-10-CM | POA: Insufficient documentation

## 2020-01-09 DIAGNOSIS — Z885 Allergy status to narcotic agent status: Secondary | ICD-10-CM | POA: Diagnosis not present

## 2020-01-09 HISTORY — DX: Personal history of colonic polyps: Z86.010

## 2020-01-09 HISTORY — DX: Personal history of other infectious and parasitic diseases: Z86.19

## 2020-01-09 HISTORY — DX: Personal history of adenomatous and serrated colon polyps: Z86.0101

## 2020-01-09 HISTORY — PX: COLONOSCOPY: SHX5424

## 2020-01-09 HISTORY — DX: Personal history of (healed) traumatic fracture: Z87.81

## 2020-01-09 HISTORY — DX: Allergic rhinitis, unspecified: J30.9

## 2020-01-09 SURGERY — COLONOSCOPY
Anesthesia: General

## 2020-01-09 MED ORDER — SODIUM CHLORIDE 0.9 % IV SOLN: INTRAVENOUS | Status: DC

## 2020-01-09 MED ORDER — PROPOFOL 10 MG/ML IV BOLUS
INTRAVENOUS | Status: DC | PRN
  Administered 2020-01-09: 50 mg via INTRAVENOUS

## 2020-01-09 MED ORDER — LIDOCAINE HCL (CARDIAC) PF 100 MG/5ML IV SOSY
PREFILLED_SYRINGE | INTRAVENOUS | Status: DC | PRN
  Administered 2020-01-09: 100 mg via INTRAVENOUS

## 2020-01-09 MED ORDER — GLYCOPYRROLATE 0.2 MG/ML IJ SOLN
INTRAMUSCULAR | Status: DC | PRN
  Administered 2020-01-09: .2 mg via INTRAVENOUS

## 2020-01-09 MED ORDER — PROPOFOL 500 MG/50ML IV EMUL
INTRAVENOUS | Status: DC | PRN
  Administered 2020-01-09: 155 ug/kg/min via INTRAVENOUS

## 2020-01-09 NOTE — Op Note (Signed)
[facility] Patient Name: Andrew Myers Procedure Date: 01/09/2020 9:35 AM MRN: [id number] Account #: [id number] Date of Birth: Apr 06, 1945 Admit Type: Outpatient Age: 74 Room: [facility] END[facility] ROOM 1 Gender: Male Note Status: Finalized Procedure:             Colonoscopy Indications:           High risk colon cancer surveillance: Personal history                         of multiple (3 or more) adenomas Providers:             Andrey Farmer MD, MD Referring MD:          Caprice Renshaw MD (Referring MD) Medicines:             Monitored Anesthesia Care Complications:         No immediate complications. Estimated blood loss:                         Minimal. Procedure:             Pre-Anesthesia Assessment:                        - Prior to the procedure, a History and Physical was                         performed, and patient medications and allergies were                         reviewed. The patient is competent. The risks and                         benefits of the procedure and the sedation options and                         risks were discussed with the patient. All questions                         were answered and informed consent was obtained.                         Patient identification and proposed procedure were                         verified by the physician, the nurse, the anesthetist                         and the technician in the endoscopy suite. Mental                         Status Examination: alert and oriented. Airway                         Examination: normal oropharyngeal airway and neck                         mobility. Respiratory Examination: clear to  auscultation. CV Examination: normal. Prophylactic                         Antibiotics: The patient does not require prophylactic                         antibiotics. Prior Anticoagulants: The patient has                         taken no previous  anticoagulant or antiplatelet                         agents. ASA Grade Assessment: II - A patient with mild                         systemic disease. After reviewing the risks and                         benefits, the patient was deemed in satisfactory                         condition to undergo the procedure. The anesthesia                         plan was to use monitored anesthesia care (MAC).                         Immediately prior to administration of medications,                         the patient was re-assessed for adequacy to receive                         sedatives. The heart rate, respiratory rate, oxygen                         saturations, blood pressure, adequacy of pulmonary                         ventilation, and response to care were monitored                         throughout the procedure. The physical status of the                         patient was re-assessed after the procedure.                        After obtaining informed consent, the colonoscope was                         passed under direct vision. Throughout the procedure,                         the patient's blood pressure, pulse, and oxygen                         saturations were monitored continuously. The  Colonoscope was introduced through the anus and                         advanced to the the cecum, identified by appendiceal                         orifice and ileocecal valve. The colonoscopy was                         performed without difficulty. The patient tolerated                         the procedure well. The quality of the bowel                         preparation was adequate to identify polyps. Findings:      The perianal and digital rectal examinations were normal.      A 2 mm polyp was found in the ascending colon. The polyp was sessile.       The polyp was removed with a jumbo cold forceps. Resection and retrieval       were complete. Estimated blood  loss was minimal.      A single medium-mouthed diverticulum was found in the sigmoid colon.      Internal hemorrhoids were found during retroflexion. The hemorrhoids       were Grade I (internal hemorrhoids that do not prolapse).      The exam was otherwise without abnormality on direct and retroflexion       views. Impression:            - One 2 mm polyp in the ascending colon, removed with                         a jumbo cold forceps. Resected and retrieved.                        - Diverticulosis in the sigmoid colon.                        - Internal hemorrhoids.                        - The examination was otherwise normal on direct and                         retroflexion views. Recommendation:        - Discharge patient to home.                        - Resume previous diet.                        - Continue present medications.                        - Await pathology results.                        - Repeat colonoscopy in 5 years for surveillance.                        -  Return to referring physician as previously                         scheduled. Procedure Code(s):     --- Professional ---                        [phone], Colonoscopy, flexible; with biopsy, single or                         multiple Diagnosis Code(s):     --- Professional ---                        Z86.010, Personal history of colonic polyps                        K63.5, Polyp of colon                        K64.0, First degree hemorrhoids                        K57.30, Diverticulosis of large intestine without                         perforation or abscess without bleeding CPT copyright 2019 American Medical Association. All rights reserved. The codes documented in this report are preliminary and upon coder review may  be revised to meet current compliance requirements. Andrey Farmer, MD Andrey Farmer MD, MD 01/09/2020 9:57:56 AM Number of Addenda: 0 Note Initiated On: 01/09/2020 9:35 AM Scope  Withdrawal Time: 0 hours 9 minutes 29 seconds  Total Procedure Duration: 0 hours 12 minutes 41 seconds  Estimated Blood Loss:  Estimated blood loss was minimal.      [facility]

## 2020-01-09 NOTE — Anesthesia Procedure Notes (Signed)
Procedure Name: General with mask airway Performed by: Kelton Pillar, CRNA Pre-anesthesia Checklist: Patient identified, Suction available, Emergency Drugs available and Patient being monitored Patient Re-evaluated:Patient Re-evaluated prior to induction Oxygen Delivery Method: Simple face mask Induction Type: IV induction Placement Confirmation: positive ETCO2 and CO2 detector Dental Injury: Teeth and Oropharynx as per pre-operative assessment

## 2020-01-09 NOTE — Anesthesia Postprocedure Evaluation (Signed)
Anesthesia Post Note  Patient: Andrew Myers  Procedure(s) Performed: COLONOSCOPY (N/A )  Patient location during evaluation: Endoscopy Anesthesia Type: General Level of consciousness: awake and alert and oriented Pain management: pain level controlled Vital Signs Assessment: post-procedure vital signs reviewed and stable Respiratory status: spontaneous breathing, nonlabored ventilation and respiratory function stable Cardiovascular status: blood pressure returned to baseline and stable Postop Assessment: no signs of nausea or vomiting Anesthetic complications: no   No complications documented.   Last Vitals:  Vitals:   01/09/20 1007 01/09/20 1017  BP: 122/68 123/70  Pulse: 61 (!) 56  Resp: (!) 24 15  Temp:    SpO2: 96% 98%    Last Pain:  Vitals:   01/09/20 1017  TempSrc:   PainSc: 0-No pain                 [name]

## 2020-01-09 NOTE — Anesthesia Preprocedure Evaluation (Signed)
Anesthesia Evaluation  Patient identified by MRN, date of birth, ID band Patient awake    Reviewed: Allergy & Precautions, NPO status , Patient's Chart, lab work & pertinent test results  History of Anesthesia Complications Negative for: history of anesthetic complications  Airway Mallampati: III  TM Distance: >3 FB Neck ROM: Full    Dental  (+) Poor Dentition   Pulmonary sleep apnea , neg COPD, former smoker,    breath sounds clear to auscultation- rhonchi (-) wheezing      Cardiovascular hypertension, Pt. on medications (-) CAD, (-) Past MI, (-) Cardiac Stents and (-) CABG  Rhythm:Regular Rate:Normal - Systolic murmurs and - Diastolic murmurs    Neuro/Psych neg Seizures negative neurological ROS  negative psych ROS   GI/Hepatic Neg liver ROS, GERD  ,  Endo/Other  diabetes (prediabetic)  Renal/GU negative Renal ROS     Musculoskeletal negative musculoskeletal ROS (+)   Abdominal (+) + obese,   Peds  Hematology negative hematology ROS (+)   Anesthesia Other Findings Past Medical History: No date: Allergic rhinitis No date: BPH (benign prostatic hyperplasia) 04/2019: CAP (community acquired pneumonia) 06/2019: Empyema lung ([place]) No date: History of adenomatous polyp of colon No date: History of chickenpox No date: History of wrist fracture No date: Hyperlipidemia No date: Hypertension No date: Obesity 05/2019: Pleural effusion on right No date: Skin cancer     Comment:  BCC, SCC No date: Sleep apnea No date: Sleep apnea   Reproductive/Obstetrics                             Anesthesia Physical Anesthesia Plan  ASA: II  Anesthesia Plan: General   Post-op Pain Management:    Induction: Intravenous  PONV Risk Score and Plan: 1 and Propofol infusion  Airway Management Planned: Natural Airway  Additional Equipment:   Intra-op Plan:   Post-operative Plan:   Informed  Consent: I have reviewed the patients History and Physical, chart, labs and discussed the procedure including the risks, benefits and alternatives for the proposed anesthesia with the patient or authorized representative who has indicated his/her understanding and acceptance.     Dental advisory given  Plan Discussed with: CRNA and Anesthesiologist  Anesthesia Plan Comments:         Anesthesia Quick Evaluation

## 2020-01-09 NOTE — H&P (Signed)
Outpatient short stay form Pre-procedure 01/09/2020 9:34 AM Raylene Miyamoto MD, MPH  Primary Physician: Dr. Baldemar Lenis  Reason for visit:  Surveillance  History of present illness:   74 y/o gentleman with history of adenomatous polyps here for surveillance colonoscopy. No family history of GI malignancies. No blood thinners. History of appendectomy. No new GI symptoms.    Current Facility-Administered Medications:  .  0.9 %  sodium chloride infusion, , Intravenous, Continuous, Reatha Sur, Hilton Cork, MD, Last Rate: 20 mL/hr at 01/09/20 0920, New Bag at 01/09/20 0920  Medications Prior to Admission  Medication Sig Dispense Refill Last Dose  . ACIDOPHILUS LACTOBACILLUS PO Take by mouth daily.     Marland Kitchen aspirin EC 81 MG tablet Take 81 mg by mouth 2 (two) times daily.    Past Week at Unknown time  . atorvastatin (LIPITOR) 20 MG tablet Take 20 mg by mouth at bedtime.    01/08/2020 at Unknown time  . celecoxib (CELEBREX) 200 MG capsule Take 200 mg by mouth daily.   Past Week at Unknown time  . cetirizine (ZYRTEC) 10 MG tablet Take 10 mg by mouth daily.   Past Week at Unknown time  . hydrochlorothiazide (HYDRODIURIL) 25 MG tablet Take 25 mg by mouth daily.   Past Week at Unknown time  . Multiple Vitamin (MULTI-VITAMIN) tablet Take 1 tablet by mouth daily.   Past Week at Unknown time  . nebivolol (BYSTOLIC) 5 MG tablet Take 5 mg by mouth daily.   01/09/2020 at Unknown time  . tamsulosin (FLOMAX) 0.4 MG CAPS capsule Take 0.8 mg by mouth daily after supper.    01/08/2020 at Unknown time  . amLODipine (NORVASC) 5 MG tablet Take 5 mg by mouth daily. (Patient not taking: Reported on 01/09/2020)   Not Taking at Unknown time  . sildenafil (REVATIO) 20 MG tablet Take 20-100 mg by mouth daily as needed (ED).      . valsartan (DIOVAN) 160 MG tablet Take 160 mg by mouth daily.         Allergies  Allergen Reactions  . Penicillins Other (See Comments)    FEBRILE SERUM SICKNESS WITH POLYARTICULAR ARTHRALGIAS  .  Augmentin [Amoxicillin-Pot Clavulanate]   . Codeine Rash     Past Medical History:  Diagnosis Date  . Allergic rhinitis   . BPH (benign prostatic hyperplasia)   . CAP (community acquired pneumonia) 04/2019  . Empyema lung ([place]) 06/2019  . History of adenomatous polyp of colon   . History of chickenpox   . History of wrist fracture   . Hyperlipidemia   . Hypertension   . Obesity   . Pleural effusion on right 05/2019  . Skin cancer    BCC, SCC  . Sleep apnea   . Sleep apnea     Review of systems:  Otherwise negative.    Physical Exam  Gen: Alert, oriented. Appears stated age.  HEENT: PERRLA. Lungs: No respiratory distress CV: RRR Abd: soft, benign, no masses.  Ext: No edema.    Planned procedures: Proceed with colonoscopy. The patient understands the nature of the planned procedure, indications, risks, alternatives and potential complications including but not limited to bleeding, infection, perforation, damage to internal organs and possible oversedation/side effects from anesthesia. The patient agrees and gives consent to proceed.  Please refer to procedure notes for findings, recommendations and patient disposition/instructions.     Raylene Miyamoto MD, MPH Gastroenterology 01/09/2020  9:34 AM

## 2020-01-09 NOTE — Transfer of Care (Signed)
Immediate Anesthesia Transfer of Care Note  Patient: Andrew Myers  Procedure(s) Performed: COLONOSCOPY (N/A )  Patient Location: Endoscopy Unit  Anesthesia Type:General  Level of Consciousness: awake, drowsy and patient cooperative  Airway & Oxygen Therapy: Patient Spontanous Breathing and Patient connected to face mask oxygen  Post-op Assessment: Report given to RN and Post -op Vital signs reviewed and stable  Post vital signs: Reviewed and stable  Last Vitals:  Vitals Value Taken Time  BP 121/67 01/09/20 0957  Temp 36 C 01/09/20 0957  Pulse 60 01/09/20 1003  Resp 21 01/09/20 1003  SpO2 95 % 01/09/20 1003  Vitals shown include unvalidated device data.  Last Pain:  Vitals:   01/09/20 0957  TempSrc: Temporal  PainSc: 0-No pain         Complications: No complications documented.

## 2020-01-09 NOTE — Interval H&P Note (Signed)
History and Physical Interval Note:  01/09/2020 9:36 AM  Andrew Myers  has presented today for surgery, with the diagnosis of PERSONAL HX.OF COLON POLYP.  The various methods of treatment have been discussed with the patient and family. After consideration of risks, benefits and other options for treatment, the patient has consented to  Procedure(s): COLONOSCOPY (N/A) as a surgical intervention.  The patient's history has been reviewed, patient examined, no change in status, stable for surgery.  I have reviewed the patient's chart and labs.  Questions were answered to the patient's satisfaction.     Lesly Rubenstein  Ok to proceed with colonoscopy

## 2020-01-10 ENCOUNTER — Encounter: Payer: Self-pay | Admitting: Gastroenterology

## 2020-01-10 LAB — SURGICAL PATHOLOGY

## 2020-01-12 DIAGNOSIS — E042 Nontoxic multinodular goiter: Secondary | ICD-10-CM | POA: Diagnosis not present

## 2020-02-07 DIAGNOSIS — G4733 Obstructive sleep apnea (adult) (pediatric): Secondary | ICD-10-CM | POA: Diagnosis not present

## 2020-02-15 NOTE — Discharge Instructions (Signed)
Instructions after Total Knee Replacement   Andrew Myers P. Dolores Ewing, Jr., M.D.     Dept. of Orthopaedics & Sports Medicine  [facility]  [address]  [place], [place]  [address]  Phone: [phone]   Fax: [phone]    DIET: Drink plenty of non-alcoholic fluids. Resume your normal diet. Include foods high in fiber.  ACTIVITY:  You may use crutches or a walker with weight-bearing as tolerated, unless instructed otherwise. You may be weaned off of the walker or crutches by your Physical Therapist.  Do NOT place pillows under the knee. Anything placed under the knee could limit your ability to straighten the knee.   Continue doing gentle exercises. Exercising will reduce the pain and swelling, increase motion, and prevent muscle weakness.   Please continue to use the TED compression stockings for 6 weeks. You may remove the stockings at night, but should reapply them in the morning. Do not drive or operate any equipment until instructed.  WOUND CARE:  Continue to use the PolarCare or ice packs periodically to reduce pain and swelling. You may bathe or shower after the staples are removed at the first office visit following surgery.  MEDICATIONS: You may resume your regular medications. Please take the pain medication as prescribed on the medication. Do not take pain medication on an empty stomach. You have been given a prescription for a blood thinner (Lovenox or Coumadin). Please take the medication as instructed. (NOTE: After completing a 2 week course of Lovenox, take one Enteric-coated aspirin once a day. This along with elevation will help reduce the possibility of phlebitis in your operated leg.) Do not drive or drink alcoholic beverages when taking pain medications.  CALL THE OFFICE FOR: Temperature above 101 degrees Excessive bleeding or drainage on the dressing. Excessive swelling, coldness, or paleness of the toes. Persistent nausea and vomiting.  FOLLOW-UP:  You  should have an appointment to return to the office in 10-14 days after surgery. Arrangements have been made for continuation of Physical Therapy (either home therapy or outpatient therapy).   [facility] Department Directory         [email or web address]       [email or web address]          Cardiology  Appointments: [place] - [phone] [place] - [phone]  Endocrinology  Appointments: [place] - [phone] [place] - [phone]  Gastroenterology  Appointments: [place] - [phone] [place] - [phone]        General Surgery   Appointments: [place] - [phone]  Internal Medicine/Family Medicine  Appointments: [place] - [phone] [place] - [phone] [place] - [phone]  Metabolic and Weigh Loss Surgery  Appointments: [place] - [phone]        Neurology  Appointments: [place] - [phone] [place] - [phone]  Neurosurgery  Appointments: [place] - [phone]  Obstetrics & Gynecology  Appointments: [place] - [phone] [place] - [phone]        Pediatrics  Appointments: [place] - [phone] [place] - [phone]  Physiatry  Appointments: [place] -[phone]  Physical Therapy  Appointments: [place] - [phone] [place] - [phone]        Podiatry  Appointments: [place] - [phone] [place] - [phone]  Pulmonology  Appointments: [place] - [phone]  Rheumatology  Appointments: [place] - [phone]         Location: [facility]  [address] , [place]  [address]  [place] Location: [facility] [address] [place], [place]  [address]  [place] Location: [facility] [address] [place], [place]  [address]    

## 2020-02-20 ENCOUNTER — Other Ambulatory Visit: Payer: Self-pay

## 2020-02-20 ENCOUNTER — Encounter
Admission: RE | Admit: 2020-02-20 | Discharge: 2020-02-20 | Disposition: A | Discharge status: Home / Self Care | Payer: PPO | Source: Ambulatory Visit | Attending: Orthopedic Surgery | Admitting: Orthopedic Surgery

## 2020-02-20 ENCOUNTER — Encounter: Payer: Self-pay | Admitting: Urgent Care

## 2020-02-20 DIAGNOSIS — Z01818 Encounter for other preprocedural examination: Secondary | ICD-10-CM | POA: Insufficient documentation

## 2020-02-20 HISTORY — DX: Diaphragmatic hernia without obstruction or gangrene: K44.9

## 2020-02-20 HISTORY — DX: Personal history of urinary calculi: Z87.442

## 2020-02-20 HISTORY — DX: Gastro-esophageal reflux disease without esophagitis: K21.9

## 2020-02-20 HISTORY — DX: Family history of other specified conditions: Z84.89

## 2020-02-20 HISTORY — DX: Pyothorax without fistula: J86.9

## 2020-02-20 LAB — CBC
HCT: 43.1 % (ref 39.0–52.0)
Hemoglobin: 14.6 g/dL (ref 13.0–17.0)
MCH: 29.6 pg (ref 26.0–34.0)
MCHC: 33.9 g/dL (ref 30.0–36.0)
MCV: 87.2 fL (ref 80.0–100.0)
Platelets: 151 10*3/uL (ref 150–400)
RBC: 4.94 MIL/uL (ref 4.22–5.81)
RDW: 13.2 % (ref 11.5–15.5)
WBC: 5.8 10*3/uL (ref 4.0–10.5)
nRBC: 0 % (ref 0.0–0.2)

## 2020-02-20 LAB — COMPREHENSIVE METABOLIC PANEL
ALT: 29 U/L (ref 0–44)
AST: 24 U/L (ref 15–41)
Albumin: 3.9 g/dL (ref 3.5–5.0)
Alkaline Phosphatase: 59 U/L (ref 38–126)
Anion gap: 9 (ref 5–15)
BUN: 16 mg/dL (ref 8–23)
CO2: 27 mmol/L (ref 22–32)
Calcium: 9.1 mg/dL (ref 8.9–10.3)
Chloride: 104 mmol/L (ref 98–111)
Creatinine, Ser: 0.62 mg/dL (ref 0.61–1.24)
GFR, Estimated: >60 mL/min (ref >60)
Glucose, Bld: 132 mg/dL — ABNORMAL HIGH (ref 70–99)
Potassium: 3 mmol/L — ABNORMAL LOW (ref 3.5–5.1)
Sodium: 140 mmol/L (ref 135–145)
Total Bilirubin: 1.1 mg/dL (ref 0.3–1.2)
Total Protein: 7 g/dL (ref 6.5–8.1)

## 2020-02-20 LAB — PROTIME-INR
INR: 1 (ref 0.8–1.2)
Prothrombin Time: 12.9 seconds (ref 11.4–15.2)

## 2020-02-20 LAB — URINALYSIS, ROUTINE W REFLEX MICROSCOPIC
Bilirubin Urine: NEGATIVE
Glucose, UA: NEGATIVE mg/dL
Hgb urine dipstick: NEGATIVE
Ketones, ur: NEGATIVE mg/dL
Leukocytes,Ua: NEGATIVE
Nitrite: NEGATIVE
Protein, ur: NEGATIVE mg/dL
Specific Gravity, Urine: 1.026 (ref 1.005–1.030)
pH: 7 (ref 5.0–8.0)

## 2020-02-20 LAB — TYPE AND SCREEN
ABO/RH(D): A POS
Antibody Screen: NEGATIVE

## 2020-02-20 LAB — SEDIMENTATION RATE: Sed Rate: 10 mm/hr (ref 0–16)

## 2020-02-20 LAB — SURGICAL PCR SCREEN
MRSA, PCR: NEGATIVE
Staphylococcus aureus: NEGATIVE

## 2020-02-20 LAB — APTT: aPTT: 28 seconds (ref 24–36)

## 2020-02-20 NOTE — Patient Instructions (Addendum)
Your procedure is scheduled on:  Friday 03/01/20.  Report to THE FIRST FLOOR REGISTRATION DESK IN THE MEDICAL MALL ON THE MORNING OF SURGERY FIRST, THEN YOU WILL CHECK IN AT THE SURGERY INFORMATION DESK LOCATED OUTSIDE THE SAME DAY SURGERY DEPARTMENT LOCATED ON 2ND FLOOR MEDICAL MALL ENTRANCE.  To find out your arrival time please call [phone] between 1PM - 3PM on Thursday 02/29/20.   Remember: Instructions that are not followed completely may result in serious medical risk, up to and including death, or upon the discretion of your surgeon and anesthesiologist your surgery may need to be rescheduled.     __X__ 1. Do not eat food after midnight the night before your procedure.                 No gum chewing or hard candies. You may drink clear liquids up to 2 hours                 before you are scheduled to arrive for your surgery- DO NOT drink clear                 liquids within 2 hours of the start of your surgery.                 Clear Liquids include:  water, apple juice without pulp, clear carbohydrate                 drink such as Clearfast or Gatorade, Black Coffee or Tea (Do not add                 milk or creamer to coffee or tea).  ** [name] would like for you to finish the Clear Pre-Surgery Ensure on the morning of surgery 2 hours prior to your arrival time. **  __X__2.  On the morning of surgery brush your teeth with toothpaste and water, you may rinse your mouth with mouthwash if you wish.  Do not swallow any toothpaste or mouthwash.    __X__ 3.  No Alcohol for 24 hours before or after surgery.  __X__ 4.  Do Not Smoke or use e-cigarettes For 24 Hours Prior to Your Surgery.                 Do not use any chewable tobacco products for at least 6 hours prior to                 surgery.  __X__5.  Notify your doctor if there is any change in your medical condition      (cold, fever, infections).      Do NOT wear jewelry, make-up, hairpins, clips or nail polish. Do NOT  wear lotions, powders, or perfumes.  Do NOT shave 48 hours prior to surgery. Men may shave face and neck. Do NOT bring valuables to the hospital.     [facility] is not responsible for any belongings or valuables.   Contacts, dentures/partials or body piercings may not be worn into surgery. Bring a case for your contacts, glasses or hearing aids, a denture cup will be supplied. Leave your suitcase in the car. After surgery it may be brought to your room.   For patients admitted to the hospital, discharge time is determined by your treatment team.    __X__ Take these medicines the morning of surgery with A SIP OF WATER:     1. cetirizine (ZYRTEC)  2. nebivolol (BYSTOLIC)  3. omeprazole (PRILOSEC)  4.  tamsulosin (FLOMAX)     __X__ Use CHG Soap as directed.  __X__ Use inhalers on the day of surgery. Also bring the inhaler with you to the hospital on the morning of surgery.  __X__ Stop Blood Thinners: Aspirin. Your last dose will be on Friday 02/23/20.  __X__ Stop Anti-inflammatories 7 days before surgery such as Advil, Ibuprofen, Motrin, BC or Goodies Powder, Naprosyn, Naproxen, Aleve, Aspirin, Meloxicam. May take Tylenol if needed for pain or discomfort.   __X__Do not start taking any new herbal supplements or vitamins prior to your procedure.     Wear comfortable clothing (specific to your surgery type) to the hospital.  Plan for stool softeners for home use; pain medications have a tendency to cause constipation. You can also help prevent constipation by eating foods high in fiber such as fruits and vegetables and drinking plenty of fluids as your diet allows.  After surgery, you can prevent lung complications by doing breathing exercises.Take deep breaths and cough every 1-2 hours. Your doctor may order a device called an Incentive Spirometer to help you take deep breaths.  Please call the Pre-Admissions Testing Department at [phone] if you have any questions about  these instructions.

## 2020-02-20 NOTE — Progress Notes (Signed)
  [facility] Perioperative Services: Pre-Admission/Anesthesia Testing  Abnormal Lab Notification   Date: 02/20/20  Name: Andrew Myers MRN:   [id number]  Re: Abnormal labs noted during PAT appointment   Provider(s) Notified: Donato Heinz, MD Notification mode: Routed and/or faxed via [facility]   ABNORMAL LAB VALUE(S): Lab Results  Component Value Date   K 3.0 (L) 02/20/2020   Notes:  Patient is scheduled for a COMPUTER ASSISTED TOTAL KNEE ARTHROPLASTY (Right Knee) on 03/01/2020. Patient on daily thiazide diuretic (HCTZ 50 mg). Will send to primary surgeon for review and optimization. Order entered for K+ to be rechecked on day of procedure to ensure correction of derangement. This is a Personal assistant; no formal response is required.  Quentin Mulling, MSN, APRN, FNP-C, CEN [facility]  Peri-operative Services Nurse Practitioner Phone: [phone] Fax: [phone] 02/20/20 2:00 PM

## 2020-02-21 DIAGNOSIS — M1711 Unilateral primary osteoarthritis, right knee: Secondary | ICD-10-CM | POA: Diagnosis not present

## 2020-02-21 LAB — HIGH SENSITIVITY CRP: CRP, High Sensitivity: 3.94 mg/L — ABNORMAL HIGH (ref 0.00–3.00)

## 2020-02-22 LAB — URINE CULTURE
Culture: <10000 — AB
Special Requests: NORMAL

## 2020-02-22 LAB — IGE: IgE (Immunoglobulin E), Serum: 41 IU/mL (ref 6–495)

## 2020-02-25 ENCOUNTER — Encounter: Payer: Self-pay | Admitting: Orthopedic Surgery

## 2020-02-25 NOTE — H&P (Signed)
ORTHOPAEDIC HISTORY & PHYSICAL Gwenlyn Fudge, [place] - 02/21/2020 3:30 PM EST Formatting of this note is different from the original. [place] [facility] Chief Complaint:   Chief Complaint  Patient presents with  . Knee Pain  H & P RIGHT KNEE   History of Present Illness:   Andrew Myers is a 75 y.o. male that presents to clinic today for his preoperative history and evaluation. Patient presents with his partner. The patient is scheduled to undergo a right total knee arthroplasty on 03/01/20 by [name]. His pain began several years ago. The pain is located primarily along the medial anterior aspect of the knee. He describes his pain as worse with any weightbearing. He reports associated swelling with some locking of the knee. He denies associated numbness or tingling, denies giving way of the knee.   The patient's symptoms have progressed to the point that they decrease his quality of life. The patient has previously undergone conservative treatment including NSAIDS and injections to the knee without adequate control of his symptoms.  Reports serum sickness after receiving amoxicillin this past year at [facility]. Denies significant cardiac history, history of blood clots, or history of lumbar surgery.   Past Medical, Surgical, Family, Social History, Allergies, Medications:   Past Medical History:  Past Medical History:  Diagnosis Date  . Allergic rhinitis  . History of adenomatous polyp of colon  . History of chickenpox  . History of elevated PSA  . History of wrist fracture 06/23/2009  Left scaphoid nondisplaced fracture with avulsion fracture.  . Hyperlipidemia  . Hypertension  . Pneumonia 06/2019  Hospitalized with CT, thorocentesis right lung effusion  . Sleep apnea  Uses CPAP   Past Surgical History:  Past Surgical History:  Procedure Laterality Date  . APPENDECTOMY  . COLONOSCOPY 01/09/2020  Tubular adenoma/PHx CP/Repeat 30yr/CTL   . FRACTURE SURGERY  Repair 5th metacarpal fracture 2003 ([place]).   Current Medications:  Current Outpatient Medications  Medication Sig Dispense Refill  . acetaminophen (TYLENOL) 500 MG tablet Take 500 mg by mouth every 8 (eight) hours as needed  . aspirin 81 MG EC tablet Take 81 mg by mouth 2 (two) times daily.  .Marland Kitchenatorvastatin (LIPITOR) 20 MG tablet TAKE 1 TABLET BY MOUTH EVERYDAY AT BEDTIME 90 tablet 3  . BYSTOLIC 5 mg tablet TAKE 1 TABLET BY MOUTH EVERY DAY 90 tablet 3  . carbamide peroxide (DEBROX) 6.5 % otic solution 5 drops 2 (two) times daily.  . celecoxib (CELEBREX) 200 MG capsule TAKE 1 CAPSULE BY MOUTH ONCE DAILY. 30 capsule 1  . cetirizine (ZYRTEC) 10 MG tablet Take 10 mg by mouth once daily  . cholecalciferol (VITAMIN D3) 1000 unit tablet Take 2,000 Units by mouth once daily  . docusate (COLACE) 100 MG capsule Take 100 mg by mouth once daily as needed  . hydroCHLOROthiazide (HYDRODIURIL) 25 MG tablet 25 mg once daily  . hydroCHLOROthiazide (HYDRODIURIL) 50 MG tablet Take 1 tablet (50 mg total) by mouth once daily 30 tablet 11  . L. acidophilus/Bifid. animalis (DAILY PROBIOTIC ORAL) Take 1 capsule by mouth once daily  . Lactobacillus acidophilus (PROBIOTIC ORAL) Take 1 capsule by mouth once daily With prebiotic  . loperamide (IMODIUM) 2 mg capsule Take 2 mg by mouth as needed  . multivitamin tablet Take 1 tablet by mouth once daily.  .Marland Kitchenomeprazole (PRILOSEC) 20 MG DR capsule Take 20 mg by mouth once daily as needed  . polyethylene glycol (MIRALAX) powder Take  17 g by mouth once daily  . sildenafil, antihypertensive, (REVATIO) 20 mg tablet Take 20-100 mg by mouth once daily as needed  . sodium, potassium, and magnesium (SUPREP) oral solution Take 1 Bottle by mouth as directed One kit contains 2 bottles. Take both bottles at the times instructed by your provider. 354 mL 0  . tamsulosin (FLOMAX) 0.4 mg capsule Take 0.4 mg by mouth 2 (two) times daily Take 30 minutes  after same meal each day.  . valsartan (DIOVAN) 160 MG tablet TAKE 1 TABLET (160 MG TOTAL) BY MOUTH ONCE DAILY 90 tablet 3   No current facility-administered medications for this visit.   Allergies:  Allergies  Allergen Reactions  . Augmentin [Amoxicillin-Pot Clavulanate] Other (See Comments)  Had a serum reaction to augmentin; reactive arthritis   . Penicillins Other (See Comments)  Had a serum reaction to penicillin and augmentin; reactive arthritis   . Codeine Itching  . Other Other (See Comments)  Narcotic Analgesics- Constipation    Social History:  Social History   Socioeconomic History  . Marital status: Single  Spouse name: Not on file  . Number of children: 0  . Years of education: 35  . Highest education level: Not on file  Occupational History  . 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 Smoking status: Former Smoker  Years: 6.00  Types: Pipe  Quit date: 1976  Years since quitting: 46.0  . Smokeless tobacco: Never Used  Vaping Use  . Vaping Use: Never used  Substance and Sexual Activity  . Alcohol use: Yes  Alcohol/week: 0.0 standard drinks  Comment: mixed drink once a month  . Drug use: Never  . Sexual activity: Yes  Partners: Female  Other Topics Concern  . Not on file  Social History Narrative  . Not on file   Social Determinants of Health   Financial Resource Strain: Not on file  Food Insecurity: Not on file  Transportation Needs: Not on file  Physical Activity: Not on file  Stress: Not on file  Social Connections: Not on file  Housing Stability: Not on file   Family History:  Family History  Problem Relation Age of Onset  . Breast cancer Mother  . Arthritis Mother  . Myocardial Infarction (Heart attack) Father  . High blood pressure (Hypertension) Father  . Thyroid cancer Father  . Heart failure Sister  . Heart disease Brother  . Alcohol abuse Brother  . Gout Niece   Review of Systems:   A 10+ ROS was performed,  reviewed, and the pertinent orthopaedic findings are documented in the HPI.   Physical Examination:   BP 142/74  Ht 177.8 cm (5' 10")  Wt (!) 103.1 kg (227 lb 3.2 oz)  BMI 32.60 kg/m   Patient is a well-developed, well-nourished male in no acute distress. Patient has normal mood and affect. Patient is alert and oriented to person, place, and time.   HEENT: Atraumatic, normocephalic. Pupils equal and reactive to light. Extraocular motion intact. Noninjected sclera.  Cardiovascular: Regular rate and rhythm, with no murmurs, rubs, or gallops. Distal pulses palpable.  Respiratory: Lungs clear to auscultation bilaterally.   Right Knee: Soft tissue swelling: minimal Effusion: none Erythema: none Crepitance: mild Tenderness: medial, anterior Alignment: relative varus Mediolateral laxity: medial pseudolaxity Posterior sag: negative Patellar tracking: Good tracking without evidence of subluxation or tilt Atrophy: No significant atrophy.  Quadriceps tone was good. Range of motion: 0/3/122 degrees   Sensation intact over the saphenous, lateral sural cutaneous, superficial  fibular, and deep fibular nerve distributions.  Tests Performed/Reviewed:  X-rays  Anteroposterior, lateral, and sunrise views of the right knee were obtained. Images reveal complete loss of medial compartment joint space with near bone-on-bone contact and significant osteophyte formation. Subchondral sclerosis is noted. Mild loss of lateral compartment joint space with mild osteophyte formation noted. Severe loss of lateral patellofemoral joint space is noted with osteophyte formation. No fractures or dislocations.  I personally ordered and interpreted today's radiographs.  Impression:   ICD-10-CM  1. Primary osteoarthritis of right knee M17.11   Plan:   The patient has end-stage degenerative changes of the right knee. It was explained to the patient that the condition is progressive in nature. Having failed  conservative treatment, the patient has elected to proceed with a total joint arthroplasty. The patient will undergo a total joint arthroplasty with [name]. The risks of surgery, including blood clot and infection, were discussed with the patient. Measures to reduce these risks, including the use of anticoagulation, perioperative antibiotics, and early ambulation were discussed. The importance of postoperative physical therapy was discussed with the patient. The patient elects to proceed with surgery. The patient is instructed to stop all blood thinners prior to surgery. The patient is instructed to call the hospital the day before surgery to learn of the proper arrival time.   Contact our office with any questions or concerns. Follow up as indicated, or sooner should any new problems arise, if conditions worsen, or if they are otherwise concerned.   Gwenlyn Fudge, PA [facility] [place], [place] [address] Phone: [phone]  This note was generated in part with voice recognition software and I apologize for any typographical errors that were not detected and corrected.  Electronically signed by Gwenlyn Fudge, PA at 02/22/2020 12:36 PM EST

## 2020-02-28 ENCOUNTER — Other Ambulatory Visit
Admission: RE | Admit: 2020-02-28 | Discharge: 2020-02-28 | Disposition: A | Discharge status: Home / Self Care | Payer: PPO | Source: Ambulatory Visit | Attending: Orthopedic Surgery | Admitting: Orthopedic Surgery

## 2020-02-28 ENCOUNTER — Other Ambulatory Visit: Payer: Self-pay

## 2020-02-28 DIAGNOSIS — Z20822 Contact with and (suspected) exposure to covid-19: Secondary | ICD-10-CM | POA: Diagnosis not present

## 2020-02-28 DIAGNOSIS — Z01818 Encounter for other preprocedural examination: Secondary | ICD-10-CM | POA: Diagnosis not present

## 2020-02-29 LAB — SARS CORONAVIRUS 2 (TAT 6-24 HRS): SARS Coronavirus 2: NEGATIVE

## 2020-03-01 ENCOUNTER — Encounter: Admission: RE | Payer: Self-pay | Source: Home / Self Care

## 2020-03-01 ENCOUNTER — Inpatient Hospital Stay: Admission: RE | Admit: 2020-03-01 | Payer: PPO | Source: Home / Self Care | Admitting: Orthopedic Surgery

## 2020-03-01 SURGERY — ARTHROPLASTY, KNEE, TOTAL, USING IMAGELESS COMPUTER-ASSISTED NAVIGATION
Anesthesia: Choice | Site: Knee | Laterality: Right

## 2020-03-29 DIAGNOSIS — R768 Other specified abnormal immunological findings in serum: Secondary | ICD-10-CM | POA: Diagnosis not present

## 2020-03-29 DIAGNOSIS — M19042 Primary osteoarthritis, left hand: Secondary | ICD-10-CM | POA: Diagnosis not present

## 2020-03-29 DIAGNOSIS — M170 Bilateral primary osteoarthritis of knee: Secondary | ICD-10-CM | POA: Diagnosis not present

## 2020-03-29 DIAGNOSIS — M19041 Primary osteoarthritis, right hand: Secondary | ICD-10-CM | POA: Diagnosis not present

## 2020-03-29 DIAGNOSIS — Z791 Long term (current) use of non-steroidal anti-inflammatories (NSAID): Secondary | ICD-10-CM | POA: Diagnosis not present

## 2020-04-16 DIAGNOSIS — E042 Nontoxic multinodular goiter: Secondary | ICD-10-CM | POA: Diagnosis not present

## 2020-04-16 HISTORY — PX: FINE NEEDLE ASPIRATION BIOPSY: CATH118315

## 2020-04-17 DIAGNOSIS — E042 Nontoxic multinodular goiter: Secondary | ICD-10-CM | POA: Diagnosis not present

## 2020-04-25 DIAGNOSIS — E7800 Pure hypercholesterolemia, unspecified: Secondary | ICD-10-CM | POA: Diagnosis not present

## 2020-04-25 DIAGNOSIS — E119 Type 2 diabetes mellitus without complications: Secondary | ICD-10-CM | POA: Diagnosis not present

## 2020-04-25 DIAGNOSIS — Z79899 Other long term (current) drug therapy: Secondary | ICD-10-CM | POA: Diagnosis not present

## 2020-04-26 ENCOUNTER — Other Ambulatory Visit: Payer: Self-pay

## 2020-04-26 ENCOUNTER — Encounter
Admission: RE | Admit: 2020-04-26 | Discharge: 2020-04-26 | Disposition: A | Discharge status: Home / Self Care | Payer: PPO | Source: Ambulatory Visit | Attending: Orthopedic Surgery | Admitting: Orthopedic Surgery

## 2020-04-26 DIAGNOSIS — Z01812 Encounter for preprocedural laboratory examination: Secondary | ICD-10-CM | POA: Insufficient documentation

## 2020-04-26 LAB — TYPE AND SCREEN
ABO/RH(D): A POS
Antibody Screen: NEGATIVE

## 2020-04-26 LAB — SURGICAL PCR SCREEN
MRSA, PCR: NEGATIVE
Staphylococcus aureus: NEGATIVE

## 2020-04-26 LAB — POTASSIUM: Potassium: 3.1 mmol/L — ABNORMAL LOW (ref 3.5–5.1)

## 2020-04-26 NOTE — Discharge Instructions (Signed)
Instructions after Total Knee Replacement   Inga Noller P. Yazlyn Wentzel, Jr., M.D.     Dept. of Orthopaedics & Sports Medicine  [facility]  [address]  [place], [place]  [address]  Phone: [phone]   Fax: [phone]    DIET: Drink plenty of non-alcoholic fluids. Resume your normal diet. Include foods high in fiber.  ACTIVITY:  You may use crutches or a walker with weight-bearing as tolerated, unless instructed otherwise. You may be weaned off of the walker or crutches by your Physical Therapist.  Do NOT place pillows under the knee. Anything placed under the knee could limit your ability to straighten the knee.   Continue doing gentle exercises. Exercising will reduce the pain and swelling, increase motion, and prevent muscle weakness.   Please continue to use the TED compression stockings for 6 weeks. You may remove the stockings at night, but should reapply them in the morning. Do not drive or operate any equipment until instructed.  WOUND CARE:  Continue to use the PolarCare or ice packs periodically to reduce pain and swelling. You may bathe or shower after the staples are removed at the first office visit following surgery.  MEDICATIONS: You may resume your regular medications. Please take the pain medication as prescribed on the medication. Do not take pain medication on an empty stomach. You have been given a prescription for a blood thinner (Lovenox or Coumadin). Please take the medication as instructed. (NOTE: After completing a 2 week course of Lovenox, take one Enteric-coated aspirin once a day. This along with elevation will help reduce the possibility of phlebitis in your operated leg.) Do not drive or drink alcoholic beverages when taking pain medications.  CALL THE OFFICE FOR: Temperature above 101 degrees Excessive bleeding or drainage on the dressing. Excessive swelling, coldness, or paleness of the toes. Persistent nausea and vomiting.  FOLLOW-UP:  You  should have an appointment to return to the office in 10-14 days after surgery. Arrangements have been made for continuation of Physical Therapy (either home therapy or outpatient therapy).   [facility] Department Directory         [email or web address]       [email or web address]          Cardiology  Appointments: [place] - [phone] [place] - [phone]  Endocrinology  Appointments: [place] - [phone] [place] - [phone]  Gastroenterology  Appointments: [place] - [phone] [place] - [phone]        General Surgery   Appointments: [place] - [phone]  Internal Medicine/Family Medicine  Appointments: [place] - [phone] [place] - [phone] [place] - [phone]  Metabolic and Weigh Loss Surgery  Appointments: [place] - [phone]        Neurology  Appointments: [place] - [phone] [place] - [phone]  Neurosurgery  Appointments: [place] - [phone]  Obstetrics & Gynecology  Appointments: [place] - [phone] [place] - [phone]        Pediatrics  Appointments: [place] - [phone] [place] - [phone]  Physiatry  Appointments: [place] -[phone]  Physical Therapy  Appointments: [place] - [phone] [place] - [phone]        Podiatry  Appointments: [place] - [phone] [place] - [phone]  Pulmonology  Appointments: [place] - [phone]  Rheumatology  Appointments: [place] - [phone]        [place] Location: [facility]  [address] , [place]  [address]  [place] Location: [facility] [address] [place], [place]  [address]  [place] Location: [facility] [address] [place], [place]  [address]    

## 2020-04-26 NOTE — Pre-Procedure Instructions (Signed)
Abnormal Potassium result 3.1 faxed to Dr Clydell Hakim office.

## 2020-04-26 NOTE — Patient Instructions (Addendum)
Your procedure is scheduled on: May 06, 2020 Monday  Report to the Registration Desk on the 1st floor of the [facility]. To find out your arrival time, please call [phone] between 1PM - 3PM on: Friday May 03, 2020 REMEMBER: Instructions that are not followed completely may result in serious medical risk, up to and including death; or upon the discretion of your surgeon and anesthesiologist your surgery may need to be rescheduled.  Do not eat food after midnight the night before surgery.  No gum chewing, lozengers or hard candies.  You may however, drink CLEAR liquids up to 2 hours before you are scheduled to arrive for your surgery. Do not drink anything within 2 hours of your scheduled arrival time.  Clear liquids include: - water  APPLE JUICE BLACK COFFEE OR TEA WITH NO CREAM, CREAMER OR MILK Do NOT drink anything that is not on this list.  Type 1 and Type 2 diabetics should only drink water.  DRINK PRESURGERY ENSURE DRINK 2 HOURS BEFORE SCHEDULED TO ARRIVE AT HOSPITAL  TAKE THESE MEDICATIONS THE MORNING OF SURGERY WITH A SIP OF WATER: BYSTOLIC (NEBIVOLOL) CETIRIZINE OMEPRAZOLE (take one the night before and one on the morning of surgery - helps to prevent nausea after surgery.)   USE FLONASE MORNING OF SURGERY  Follow recommendations from Cardiologist, Pulmonologist or PCP regarding stopping Aspirin. STOP ASPIRIN LAST 04/28/2020 One week prior to surgery: 04/28/2020 Stop Anti-inflammatories (NSAIDS) such as Advil, Aleve, Ibuprofen, Motrin, Naproxen, Naprosyn and Aspirin based products such as Excedrin, Goodys Powder, BC Powder.  USE ONLY TYLENOL Stop ANY OVER THE COUNTER supplements until after surgery EXCEPT VIT D3, AND MULTIVITAMIN  No Alcohol for 24 hours before or after surgery.  No Smoking including e-cigarettes for 24 hours prior to surgery.  No chewable tobacco products for at least 6 hours prior to surgery.  No nicotine patches on the day of surgery.  Do  not use any "recreational" drugs for at least a week prior to your surgery.  Please be advised that the combination of cocaine and anesthesia may have negative outcomes, up to and including death. If you test positive for cocaine, your surgery will be cancelled.  On the morning of surgery brush your teeth with toothpaste and water, you may rinse your mouth with mouthwash if you wish. Do not swallow any toothpaste or mouthwash.  Do not wear jewelry, make-up, hairpins, clips or nail polish.  Do not wear lotions, powders, or perfumes OR DEODORANT   Do not shave body from the neck down 48 hours prior to surgery just in case you cut yourself which could leave a site for infection.  Also, freshly shaved skin may become irritated if using the CHG soap.  Contact lenses, hearing aids and dentures may not be worn into surgery.  Do not bring valuables to the hospital. [facility] is not responsible for any missing/lost belongings or valuables.   Use CHG Soap as directed on instruction sheet.  Bring your BI-PAP to the hospital with you   Notify your doctor if there is any change in your medical condition (cold, fever, infection).  Wear comfortable clothing (specific to your surgery type) to the hospital.  Plan for stool softeners for home use; pain medications have a tendency to cause constipation. You can also help prevent constipation by eating foods high in fiber such as fruits and vegetables and drinking plenty of fluids as your diet allows.  After surgery, you can help prevent lung complications by doing  breathing exercises.  Take deep breaths and cough every 1-2 hours. Your doctor may order a device called an Incentive Spirometer to help you take deep breaths. When coughing or sneezing, hold a pillow firmly against your incision with both hands. This is called "splinting." Doing this helps protect your incision. It also decreases belly discomfort.  If you are being admitted to the hospital  overnight, YOU MAY [place].  If you are being discharged the day of surgery, you will not be allowed to drive home. You will need a responsible adult (18 years or older) to drive you home and stay with you that night.   If you are taking public transportation, you will need to have a responsible adult (18 years or older) with you. Please confirm with your physician that it is acceptable to use public transportation.   Please call the [facility] Dept. at [phone] if you have any questions about these instructions.  Surgery Visitation Policy:  Patients undergoing a surgery or procedure may have one family member or support person with them as long as that person is not COVID-19 positive or experiencing its symptoms.  That person may remain in the waiting area during the procedure.  Inpatient Visitation:    Visiting hours are 7 a.m. to 8 p.m. Inpatients will be allowed two visitors daily. The visitors may change each day during the patient's stay. No visitors under the age of 47. Any visitor under the age of 1 must be accompanied by an adult. The visitor must pass COVID-19 screenings, use hand sanitizer when entering and exiting the patient's room and wear a mask at all times, including in the patient's room. Patients must also wear a mask when staff or their visitor are in the room. Masking is required regardless of vaccination status.

## 2020-04-30 DIAGNOSIS — M1711 Unilateral primary osteoarthritis, right knee: Secondary | ICD-10-CM | POA: Diagnosis not present

## 2020-05-02 ENCOUNTER — Other Ambulatory Visit
Admission: RE | Admit: 2020-05-02 | Discharge: 2020-05-02 | Disposition: A | Discharge status: Home / Self Care | Payer: PPO | Source: Ambulatory Visit | Attending: Orthopedic Surgery | Admitting: Orthopedic Surgery

## 2020-05-02 ENCOUNTER — Other Ambulatory Visit: Payer: Self-pay

## 2020-05-02 DIAGNOSIS — Z125 Encounter for screening for malignant neoplasm of prostate: Secondary | ICD-10-CM | POA: Diagnosis not present

## 2020-05-02 DIAGNOSIS — Z20822 Contact with and (suspected) exposure to covid-19: Secondary | ICD-10-CM | POA: Insufficient documentation

## 2020-05-02 DIAGNOSIS — I152 Hypertension secondary to endocrine disorders: Secondary | ICD-10-CM | POA: Diagnosis not present

## 2020-05-02 DIAGNOSIS — Z01812 Encounter for preprocedural laboratory examination: Secondary | ICD-10-CM | POA: Insufficient documentation

## 2020-05-02 DIAGNOSIS — E876 Hypokalemia: Secondary | ICD-10-CM | POA: Diagnosis not present

## 2020-05-02 DIAGNOSIS — Z79899 Other long term (current) drug therapy: Secondary | ICD-10-CM | POA: Diagnosis not present

## 2020-05-02 DIAGNOSIS — G4733 Obstructive sleep apnea (adult) (pediatric): Secondary | ICD-10-CM | POA: Diagnosis not present

## 2020-05-02 DIAGNOSIS — E1159 Type 2 diabetes mellitus with other circulatory complications: Secondary | ICD-10-CM | POA: Diagnosis not present

## 2020-05-02 DIAGNOSIS — E7800 Pure hypercholesterolemia, unspecified: Secondary | ICD-10-CM | POA: Diagnosis not present

## 2020-05-02 LAB — SARS CORONAVIRUS 2 (TAT 6-24 HRS): SARS Coronavirus 2: NEGATIVE

## 2020-05-05 ENCOUNTER — Encounter: Payer: Self-pay | Admitting: Orthopedic Surgery

## 2020-05-05 NOTE — H&P (Signed)
ORTHOPAEDIC HISTORY & PHYSICAL Gwenlyn Fudge, [place] - 04/30/2020 10:45 AM EST Formatting of this note is different from the original. [place] MEDICINE Chief Complaint:   Chief Complaint  Patient presents with  . Knee Pain  H & P RIGHT KNEE   History of Present Illness:   Andrew Myers is a 75 y.o. male that presents to clinic today for his preoperative history and evaluation. Patient presents unaccompanied. The patient is scheduled to undergo a right total knee arthroplasty on 05/06/20 by [name]. His pain began several years ago. The pain is located along the medial and anterior aspect of the knee. He describes his pain as worse with weightbearing. He reports associated swelling with some locking of the knee. He denies associated numbness or tingling, denies giving way.   The patient's symptoms have progressed to the point that they decrease his quality of life. The patient has previously undergone conservative treatment including NSAIDS and injections to the knee without adequate control of his symptoms.  Patient will be staying with a friend following surgery. His friends address is [address], [place], [place] [address]. Patient reports a serum sickness reactive arthritis following use of penicillin. He had this reaction in May of 2021.  Past Medical, Surgical, Family, Social History, Allergies, Medications:   Past Medical History:  Past Medical History:  Diagnosis Date  . Allergic rhinitis  . History of adenomatous polyp of colon  . History of chickenpox  . History of elevated PSA  . History of wrist fracture 06/23/2009  Left scaphoid nondisplaced fracture with avulsion fracture.  . Hyperlipidemia  . Hypertension  . Pneumonia 06/2019  Hospitalized with CT, thorocentesis right lung effusion  . Sleep apnea  Uses CPAP   Past Surgical History:  Past Surgical History:  Procedure Laterality Date  . APPENDECTOMY  .  COLONOSCOPY 01/09/2020  Tubular adenoma/PHx CP/Repeat 50yrs/CTL  . FRACTURE SURGERY  Repair 5th metacarpal fracture 2003 ([place]).   Current Medications:  Current Outpatient Medications  Medication Sig Dispense Refill  . acetaminophen (TYLENOL) 500 MG tablet Take 500 mg by mouth every 8 (eight) hours as needed  . ascorbic acid, vitamin C, (VITAMIN C) 1000 MG tablet Take 1,000 mg by mouth once daily  . aspirin 81 MG EC tablet Take 81 mg by mouth 2 (two) times daily.  Marland Kitchen atorvastatin (LIPITOR) 20 MG tablet TAKE 1 TABLET BY MOUTH EVERYDAY AT BEDTIME 90 tablet 3  . bisacodyL (DULCOLAX) 5 mg EC tablet Take 10 mg by mouth once daily 1-3 TABLETS A DAY  . carbamide peroxide (DEBROX) 6.5 % otic solution Place 2 drops into both ears once daily as needed  . carica papaya (PAPAYA ENZYME) Tab Take 4 tablets by mouth once daily as needed  . celecoxib (CELEBREX) 200 MG capsule TAKE 1 CAPSULE BY MOUTH ONCE DAILY 30 capsule 1  . cetirizine (ZYRTEC) 10 MG tablet Take 10 mg by mouth once daily  . cholecalciferol (VITAMIN D3) 1000 unit tablet Take 2,000 Units by mouth once daily  . diphenhydrAMINE (BENADRYL) 25 mg capsule Take 25 mg by mouth every 6 (six) hours as needed  . docusate (COLACE) 100 MG capsule Take 100 mg by mouth once daily as needed  . fluticasone propionate (FLONASE) 50 mcg/actuation nasal spray Place 1 spray into both nostrils once daily  . hydroCHLOROthiazide (HYDRODIURIL) 50 MG tablet Take 1 tablet (50 mg total) by mouth once daily 30 tablet 11  . Lactobacillus acidophilus (PROBIOTIC  ORAL) Take 1 capsule by mouth once daily With prebiotic  . loperamide (IMODIUM) 2 mg capsule Take 2 mg by mouth as needed  . magnesium salicylate (PERCOGESIC BACKACHE RELIEF) 580 (467) mg Tab Take 2 tablets by mouth every 6 (six) hours as needed  . melatonin 10 mg Tab Take 10 mg by mouth nightly as needed  . miconazole 2 % powder Apply topically as needed  . multivitamin tablet Take 1 tablet by mouth  once daily.  Marland Kitchen neomycin-bacitracnZn-polymyxnB 3.5-500-10,000 mg-unit-unit Oint Apply topically  . omega-3 fatty acids-fish oil (FISH OIL) 360-1,200 mg Cap Take 1 capsule by mouth once daily  . omeprazole (PRILOSEC) 20 MG DR capsule Take 20 mg by mouth once daily as needed  . polyethylene glycol (MIRALAX) powder Take 17 g by mouth once daily as needed  . potassium chloride (KLOR-CON) 20 MEQ ER tablet Take 1 tablet (20 mEq total) by mouth once daily for 7 days 7 tablet 0  . propylene glycoL (SYSTANE BALANCE) 0.6 % ophthalmic drops Place 1 drop into both eyes nightly  . pseudoephedrine (SUDAFED) 30 mg tablet Take 30 mg by mouth every 4 (four) hours as needed  . sildenafil, antihypertensive, (REVATIO) 20 mg tablet Take 20-100 mg by mouth once daily as needed  . simethicone (MYLICON) 147 MG chewable tablet Take 125 mg by mouth once daily as needed  . sodium chloride (AYR) 0.65 % nasal drops Place 1 drop into one nostril as needed  . tamsulosin (FLOMAX) 0.4 mg capsule Take 0.4 mg by mouth 2 (two) times daily Take 30 minutes after same meal each day.  . turmeric-herbal complex no.278 150 mg Cap Take 1 capsule by mouth 3 (three) times daily  . valsartan (DIOVAN) 160 MG tablet TAKE 1 TABLET (160 MG TOTAL) BY MOUTH ONCE DAILY 90 tablet 3  . nebivoloL (BYSTOLIC) 5 MG tablet TAKE 1 TABLET BY MOUTH EVERY DAY 90 tablet 3   No current facility-administered medications for this visit.   Allergies:  Allergies  Allergen Reactions  . Augmentin [Amoxicillin-Pot Clavulanate] Other (See Comments)  Had a serum reaction to augmentin; reactive arthritis   . Penicillins Other (See Comments)  Had a serum reaction to penicillin and augmentin; reactive arthritis   . Codeine Itching  . Other Other (See Comments)  Narcotic Analgesics- Constipation    Social History:  Social History   Socioeconomic History  . Marital status: Single  Spouse name: Not on file  . Number of children: 0  . Years of education:  58  . Highest education level: Not on file  Occupational History  . 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 Smoking status: Former Smoker  Years: 6.00  Types: Pipe  Quit date: 1976  Years since quitting: 46.2  . Smokeless tobacco: Never Used  Vaping Use  . Vaping Use: Never used  Substance and Sexual Activity  . Alcohol use: Yes  Alcohol/week: 0.0 standard drinks  Comment: mixed drink once a month  . Drug use: Never  . Sexual activity: Yes  Partners: Female  Other Topics Concern  . Not on file  Social History Narrative  . Not on file   Social Determinants of Health   Financial Resource Strain: Not on file  Food Insecurity: Not on file  Transportation Needs: Not on file  Physical Activity: Not on file  Stress: Not on file  Social Connections: Not on file  Housing Stability: Not on file   Family History:  Family History  Problem Relation Age  of Onset  . Breast cancer Mother  . Arthritis Mother  . Myocardial Infarction (Heart attack) Father  . High blood pressure (Hypertension) Father  . Thyroid cancer Father  . Heart failure Sister  . Heart disease Brother  . Alcohol abuse Brother  . Gout Niece   Review of Systems:   A 10+ ROS was performed, reviewed, and the pertinent orthopaedic findings are documented in the HPI.   Physical Examination:   BP (!) 140/84 (BP Location: Left upper arm, Patient Position: Sitting, BP Cuff Size: Large Adult)  Ht 177.8 cm (5\' 10" )  Wt (!) 106.1 kg (234 lb)  BMI 33.58 kg/m   Patient is a well-developed, well-nourished male in no acute distress. Patient has normal mood and affect. Patient is alert and oriented to person, place, and time.   HEENT: Atraumatic, normocephalic. Pupils equal and reactive to light. Extraocular motion intact. Noninjected sclera.  Cardiovascular: Regular rate and rhythm, with no murmurs, rubs, or gallops. Distal pulses palpable. No bruits.  Respiratory: Lungs clear to auscultation  bilaterally.   Right Knee: Soft tissue swelling: minimal Effusion: none Erythema: none Crepitance: mild Tenderness: medial, anterior Alignment: relative varus Mediolateral laxity: medial pseudolaxity Posterior sag: negative Patellar tracking: Good tracking without evidence of subluxation or tilt Atrophy: No significant atrophy.  Quadriceps tone was good. Range of motion: 0/4/124 degrees   Sensation intact over the saphenous, lateral sural cutaneous, superficial fibular, and deep fibular nerve distributions.  Tests Performed/Reviewed:  X-rays  3 views of the right knee were obtained. Images reveal severe loss of medial compartment joint space with near bone-on-bone contact, subchondral sclerosis, and osteophyte formation noted. No fractures noted.  I personally ordered and interpreted today's radiographs.  Impression:   ICD-10-CM  1. Primary osteoarthritis of right knee M17.11   Plan:   The patient has end-stage degenerative changes of the right knee. It was explained to the patient that the condition is progressive in nature. Having failed conservative treatment, the patient has elected to proceed with a total joint arthroplasty. The patient will undergo a total joint arthroplasty with [name]. The risks of surgery, including blood clot and infection, were discussed with the patient. Measures to reduce these risks, including the use of anticoagulation, perioperative antibiotics, and early ambulation were discussed. The importance of postoperative physical therapy was discussed with the patient. The patient elects to proceed with surgery. The patient is instructed to stop all blood thinners prior to surgery. The patient is instructed to call the hospital the day before surgery to learn of the proper arrival time.   Contact our office with any questions or concerns. Follow up as indicated, or sooner should any new problems arise, if conditions worsen, or if they are otherwise  concerned.   Gwenlyn Fudge, PA-C [facility] [place], [place] [address] Phone: [phone]  This note was generated in part with voice recognition software and I apologize for any typographical errors that were not detected and corrected.   Electronically signed by Gwenlyn Fudge, PA at 05/01/2020 5:36 PM EST

## 2020-05-06 ENCOUNTER — Inpatient Hospital Stay
Admission: RE | Admit: 2020-05-06 | Discharge: 2020-05-07 | DRG: 470 | Disposition: A | Discharge status: Home Health | Payer: PPO | Attending: Orthopedic Surgery | Admitting: Orthopedic Surgery

## 2020-05-06 ENCOUNTER — Inpatient Hospital Stay: Payer: PPO

## 2020-05-06 ENCOUNTER — Encounter: Payer: Self-pay | Admitting: Orthopedic Surgery

## 2020-05-06 ENCOUNTER — Other Ambulatory Visit: Payer: Self-pay

## 2020-05-06 ENCOUNTER — Encounter
Admission: RE | Disposition: A | Discharge status: Home Health | Payer: Self-pay | Source: Home / Self Care | Attending: Orthopedic Surgery

## 2020-05-06 ENCOUNTER — Inpatient Hospital Stay: Payer: PPO | Admitting: Certified Registered Nurse Anesthetist

## 2020-05-06 DIAGNOSIS — K219 Gastro-esophageal reflux disease without esophagitis: Secondary | ICD-10-CM | POA: Diagnosis not present

## 2020-05-06 DIAGNOSIS — Z01812 Encounter for preprocedural laboratory examination: Secondary | ICD-10-CM | POA: Diagnosis not present

## 2020-05-06 DIAGNOSIS — M1711 Unilateral primary osteoarthritis, right knee: Secondary | ICD-10-CM | POA: Diagnosis not present

## 2020-05-06 DIAGNOSIS — G4730 Sleep apnea, unspecified: Secondary | ICD-10-CM | POA: Diagnosis not present

## 2020-05-06 DIAGNOSIS — Z96659 Presence of unspecified artificial knee joint: Secondary | ICD-10-CM

## 2020-05-06 DIAGNOSIS — Z79899 Other long term (current) drug therapy: Secondary | ICD-10-CM | POA: Diagnosis not present

## 2020-05-06 DIAGNOSIS — E785 Hyperlipidemia, unspecified: Secondary | ICD-10-CM | POA: Diagnosis present

## 2020-05-06 DIAGNOSIS — E119 Type 2 diabetes mellitus without complications: Secondary | ICD-10-CM | POA: Diagnosis not present

## 2020-05-06 DIAGNOSIS — N400 Enlarged prostate without lower urinary tract symptoms: Secondary | ICD-10-CM | POA: Diagnosis not present

## 2020-05-06 DIAGNOSIS — Z85828 Personal history of other malignant neoplasm of skin: Secondary | ICD-10-CM | POA: Diagnosis not present

## 2020-05-06 DIAGNOSIS — Z8249 Family history of ischemic heart disease and other diseases of the circulatory system: Secondary | ICD-10-CM

## 2020-05-06 DIAGNOSIS — I10 Essential (primary) hypertension: Secondary | ICD-10-CM | POA: Diagnosis not present

## 2020-05-06 DIAGNOSIS — Z96651 Presence of right artificial knee joint: Secondary | ICD-10-CM

## 2020-05-06 DIAGNOSIS — Z7982 Long term (current) use of aspirin: Secondary | ICD-10-CM

## 2020-05-06 DIAGNOSIS — Z471 Aftercare following joint replacement surgery: Secondary | ICD-10-CM | POA: Diagnosis not present

## 2020-05-06 HISTORY — PX: KNEE ARTHROPLASTY: SHX992

## 2020-05-06 LAB — POCT I-STAT, CHEM 8
BUN: 15 mg/dL (ref 8–23)
Calcium, Ion: 1.18 mmol/L (ref 1.15–1.40)
Chloride: 97 mmol/L — ABNORMAL LOW (ref 98–111)
Creatinine, Ser: 0.9 mg/dL (ref 0.61–1.24)
Glucose, Bld: 145 mg/dL — ABNORMAL HIGH (ref 70–99)
HCT: 40 % (ref 39.0–52.0)
Hemoglobin: 13.6 g/dL (ref 13.0–17.0)
Potassium: 3.1 mmol/L — ABNORMAL LOW (ref 3.5–5.1)
Sodium: 139 mmol/L (ref 135–145)
TCO2: 27 mmol/L (ref 22–32)

## 2020-05-06 SURGERY — ARTHROPLASTY, KNEE, TOTAL, USING IMAGELESS COMPUTER-ASSISTED NAVIGATION
Anesthesia: Spinal | Site: Knee | Laterality: Right

## 2020-05-06 MED ORDER — LIDOCAINE HCL (PF) 2 % IJ SOLN
INTRAMUSCULAR | Status: AC
  Filled 2020-05-06: qty 5

## 2020-05-06 MED ORDER — SALINE SPRAY 0.65 % NA SOLN
1.0000 | NASAL | Status: DC | PRN
  Filled 2020-05-06: qty 44

## 2020-05-06 MED ORDER — GABAPENTIN 300 MG PO CAPS
ORAL_CAPSULE | ORAL | Status: AC
  Administered 2020-05-06: 300 mg via ORAL
  Filled 2020-05-06: qty 1

## 2020-05-06 MED ORDER — OXYCODONE HCL 5 MG PO TABS: 10.0000 mg | ORAL_TABLET | ORAL | Status: DC | PRN

## 2020-05-06 MED ORDER — NEBIVOLOL HCL 5 MG PO TABS
5.0000 mg | ORAL_TABLET | Freq: Every day | ORAL | Status: DC
  Administered 2020-05-07: 5 mg via ORAL
  Filled 2020-05-06: qty 1

## 2020-05-06 MED ORDER — LORATADINE 10 MG PO TABS
10.0000 mg | ORAL_TABLET | Freq: Every day | ORAL | Status: DC
  Administered 2020-05-07: 10 mg via ORAL
  Filled 2020-05-06 (×2): qty 1

## 2020-05-06 MED ORDER — ACETAMINOPHEN 325 MG PO TABS: 325.0000 mg | ORAL_TABLET | Freq: Four times a day (QID) | ORAL | Status: DC | PRN

## 2020-05-06 MED ORDER — ASCORBIC ACID 500 MG PO TABS
1000.0000 mg | ORAL_TABLET | Freq: Two times a day (BID) | ORAL | Status: DC
  Administered 2020-05-06 – 2020-05-07 (×3): 1000 mg via ORAL
  Filled 2020-05-06 (×3): qty 2

## 2020-05-06 MED ORDER — PROPOFOL 10 MG/ML IV BOLUS
INTRAVENOUS | Status: DC | PRN
  Administered 2020-05-06: 20 mg via INTRAVENOUS

## 2020-05-06 MED ORDER — MAGNESIUM HYDROXIDE 400 MG/5ML PO SUSP
ORAL | Status: AC
  Administered 2020-05-06: 30 mL via ORAL
  Filled 2020-05-06: qty 30

## 2020-05-06 MED ORDER — TRANEXAMIC ACID-NACL 1000-0.7 MG/100ML-% IV SOLN
INTRAVENOUS | Status: AC
  Filled 2020-05-06: qty 100

## 2020-05-06 MED ORDER — CHLORHEXIDINE GLUCONATE 0.12 % MT SOLN
OROMUCOSAL | Status: AC
  Administered 2020-05-06: 15 mL via OROMUCOSAL
  Filled 2020-05-06: qty 15

## 2020-05-06 MED ORDER — DEXAMETHASONE SODIUM PHOSPHATE 10 MG/ML IJ SOLN: 8.0000 mg | Freq: Once | INTRAMUSCULAR | Status: AC

## 2020-05-06 MED ORDER — ONDANSETRON HCL 4 MG/2ML IJ SOLN: 4.0000 mg | Freq: Once | INTRAMUSCULAR | Status: DC | PRN

## 2020-05-06 MED ORDER — ACETAMINOPHEN 10 MG/ML IV SOLN
INTRAVENOUS | Status: DC | PRN
  Administered 2020-05-06: 1000 mg via INTRAVENOUS

## 2020-05-06 MED ORDER — RISAQUAD PO CAPS
1.0000 | ORAL_CAPSULE | Freq: Every day | ORAL | Status: DC
  Administered 2020-05-06 – 2020-05-07 (×2): 1 via ORAL
  Filled 2020-05-06 (×2): qty 1

## 2020-05-06 MED ORDER — METOCLOPRAMIDE HCL 10 MG PO TABS
10.0000 mg | ORAL_TABLET | Freq: Three times a day (TID) | ORAL | Status: DC
  Administered 2020-05-06 – 2020-05-07 (×3): 10 mg via ORAL
  Filled 2020-05-06 (×3): qty 1

## 2020-05-06 MED ORDER — PHENOL 1.4 % MT LIQD
1.0000 | OROMUCOSAL | Status: DC | PRN
  Filled 2020-05-06: qty 177

## 2020-05-06 MED ORDER — CELECOXIB 200 MG PO CAPS: 400.0000 mg | ORAL_CAPSULE | Freq: Once | ORAL | Status: AC

## 2020-05-06 MED ORDER — ADULT MULTIVITAMIN W/MINERALS CH
1.0000 | ORAL_TABLET | Freq: Every day | ORAL | Status: DC
  Administered 2020-05-06 – 2020-05-07 (×2): 1 via ORAL
  Filled 2020-05-06 (×2): qty 1

## 2020-05-06 MED ORDER — FERROUS SULFATE 325 (65 FE) MG PO TABS
325.0000 mg | ORAL_TABLET | Freq: Two times a day (BID) | ORAL | Status: DC
  Administered 2020-05-06 – 2020-05-07 (×2): 325 mg via ORAL
  Filled 2020-05-06 (×2): qty 1

## 2020-05-06 MED ORDER — HYDROCHLOROTHIAZIDE 25 MG PO TABS
50.0000 mg | ORAL_TABLET | Freq: Every day | ORAL | Status: DC
Start: 2020-05-06 — End: 2020-05-07
  Administered 2020-05-06 – 2020-05-07 (×2): 50 mg via ORAL
  Filled 2020-05-06 (×2): qty 2
  Filled 2020-05-06: qty 1

## 2020-05-06 MED ORDER — DIPHENHYDRAMINE HCL 12.5 MG/5ML PO ELIX
12.5000 mg | ORAL_SOLUTION | ORAL | Status: DC | PRN
  Filled 2020-05-06: qty 10

## 2020-05-06 MED ORDER — GABAPENTIN 300 MG PO CAPS: 300.0000 mg | ORAL_CAPSULE | Freq: Once | ORAL | Status: AC

## 2020-05-06 MED ORDER — TRANEXAMIC ACID-NACL 1000-0.7 MG/100ML-% IV SOLN
INTRAVENOUS | Status: AC
  Administered 2020-05-06: 1000 mg via INTRAVENOUS
  Filled 2020-05-06: qty 100

## 2020-05-06 MED ORDER — TAMSULOSIN HCL 0.4 MG PO CAPS
0.8000 mg | ORAL_CAPSULE | Freq: Two times a day (BID) | ORAL | Status: DC
  Administered 2020-05-06: 0.8 mg via ORAL
  Filled 2020-05-06 (×2): qty 2

## 2020-05-06 MED ORDER — ACETAMINOPHEN 10 MG/ML IV SOLN
INTRAVENOUS | Status: AC
  Filled 2020-05-06: qty 100

## 2020-05-06 MED ORDER — FENTANYL CITRATE (PF) 100 MCG/2ML IJ SOLN
INTRAMUSCULAR | Status: DC | PRN
  Administered 2020-05-06: 25 ug via INTRAVENOUS
  Administered 2020-05-06: 50 ug via INTRAVENOUS
  Administered 2020-05-06: 25 ug via INTRAVENOUS

## 2020-05-06 MED ORDER — FLUTICASONE PROPIONATE 50 MCG/ACT NA SUSP
1.0000 | Freq: Every day | NASAL | Status: DC
  Administered 2020-05-06: 1 via NASAL
  Filled 2020-05-06: qty 16

## 2020-05-06 MED ORDER — POTASSIUM CHLORIDE CRYS ER 20 MEQ PO TBCR
20.0000 meq | EXTENDED_RELEASE_TABLET | Freq: Every day | ORAL | Status: DC
  Administered 2020-05-07: 20 meq via ORAL
  Filled 2020-05-06: qty 1

## 2020-05-06 MED ORDER — CHLORHEXIDINE GLUCONATE 0.12 % MT SOLN: 15.0000 mL | Freq: Once | OROMUCOSAL | Status: AC

## 2020-05-06 MED ORDER — VITAMIN A & D 5000-400 UNITS PO CAPS: 1.0000 | ORAL_CAPSULE | Freq: Every day | ORAL | Status: DC

## 2020-05-06 MED ORDER — BUPIVACAINE HCL (PF) 0.25 % IJ SOLN
INTRAMUSCULAR | Status: DC | PRN
  Administered 2020-05-06: 620 mL

## 2020-05-06 MED ORDER — PROPOFOL 500 MG/50ML IV EMUL
INTRAVENOUS | Status: AC
  Filled 2020-05-06: qty 50

## 2020-05-06 MED ORDER — VITAMIN D3 25 MCG (1000 UNIT) PO TABS
1000.0000 [IU] | ORAL_TABLET | Freq: Every day | ORAL | Status: DC
  Administered 2020-05-06 – 2020-05-07 (×2): 1000 [IU] via ORAL
  Filled 2020-05-06: qty 1
  Filled 2020-05-06: qty 3
  Filled 2020-05-06 (×2): qty 1

## 2020-05-06 MED ORDER — PROPOFOL 500 MG/50ML IV EMUL
INTRAVENOUS | Status: AC
  Filled 2020-05-06: qty 100

## 2020-05-06 MED ORDER — POLYETHYLENE GLYCOL 3350 17 GM/SCOOP PO POWD
17.0000 g | Freq: Every day | ORAL | Status: DC | PRN
  Filled 2020-05-06: qty 255

## 2020-05-06 MED ORDER — POLYVINYL ALCOHOL 1.4 % OP SOLN
1.0000 [drp] | Freq: Every day | OPHTHALMIC | Status: DC
  Administered 2020-05-06: 1 [drp] via OPHTHALMIC
  Filled 2020-05-06: qty 15

## 2020-05-06 MED ORDER — PANTOPRAZOLE SODIUM 40 MG PO TBEC
40.0000 mg | DELAYED_RELEASE_TABLET | Freq: Two times a day (BID) | ORAL | Status: DC
  Administered 2020-05-06 – 2020-05-07 (×2): 40 mg via ORAL
  Filled 2020-05-06 (×3): qty 1

## 2020-05-06 MED ORDER — ACETAMINOPHEN 10 MG/ML IV SOLN
INTRAVENOUS | Status: AC
  Administered 2020-05-06: 1000 mg via INTRAVENOUS
  Filled 2020-05-06: qty 100

## 2020-05-06 MED ORDER — PHENYLEPHRINE HCL (PRESSORS) 10 MG/ML IV SOLN
INTRAVENOUS | Status: AC
  Filled 2020-05-06: qty 1

## 2020-05-06 MED ORDER — LIDOCAINE HCL (CARDIAC) PF 100 MG/5ML IV SOSY
PREFILLED_SYRINGE | INTRAVENOUS | Status: DC | PRN
  Administered 2020-05-06: 50 mg via INTRAVENOUS

## 2020-05-06 MED ORDER — POTASSIUM CHLORIDE CRYS ER 20 MEQ PO TBCR
EXTENDED_RELEASE_TABLET | ORAL | Status: AC
  Administered 2020-05-06: 20 meq via ORAL
  Filled 2020-05-06: qty 1

## 2020-05-06 MED ORDER — PROPOFOL 500 MG/50ML IV EMUL
INTRAVENOUS | Status: DC | PRN
  Administered 2020-05-06: 150 ug/kg/min via INTRAVENOUS

## 2020-05-06 MED ORDER — FENTANYL CITRATE (PF) 100 MCG/2ML IJ SOLN
INTRAMUSCULAR | Status: AC
  Filled 2020-05-06: qty 2

## 2020-05-06 MED ORDER — POLYETHYLENE GLYCOL 3350 17 G PO PACK
17.0000 g | PACK | Freq: Every day | ORAL | Status: DC | PRN
  Filled 2020-05-06: qty 1

## 2020-05-06 MED ORDER — CEFAZOLIN SODIUM-DEXTROSE 2-4 GM/100ML-% IV SOLN
2.0000 g | INTRAVENOUS | Status: AC
  Administered 2020-05-06: 2 g via INTRAVENOUS

## 2020-05-06 MED ORDER — DIPHENHYDRAMINE HCL 25 MG PO CAPS: 25.0000 mg | ORAL_CAPSULE | Freq: Four times a day (QID) | ORAL | Status: DC | PRN

## 2020-05-06 MED ORDER — FLEET ENEMA 7-19 GM/118ML RE ENEM: 1.0000 | ENEMA | Freq: Once | RECTAL | Status: DC | PRN

## 2020-05-06 MED ORDER — CHLORHEXIDINE GLUCONATE 4 % EX LIQD: 60.0000 mL | Freq: Once | CUTANEOUS | Status: DC

## 2020-05-06 MED ORDER — DEXAMETHASONE SODIUM PHOSPHATE 10 MG/ML IJ SOLN
INTRAMUSCULAR | Status: AC
  Administered 2020-05-06: 8 mg via INTRAVENOUS
  Filled 2020-05-06: qty 1

## 2020-05-06 MED ORDER — NEOMYCIN-POLYMYXIN B GU 40-200000 IR SOLN
Status: DC | PRN
  Administered 2020-05-06: 16 mL

## 2020-05-06 MED ORDER — TRAMADOL HCL 50 MG PO TABS: 50.0000 mg | ORAL_TABLET | ORAL | Status: DC | PRN

## 2020-05-06 MED ORDER — ALUM & MAG HYDROXIDE-SIMETH 200-200-20 MG/5ML PO SUSP: 30.0000 mL | ORAL | Status: DC | PRN

## 2020-05-06 MED ORDER — LACTATED RINGERS IV SOLN: INTRAVENOUS | Status: DC

## 2020-05-06 MED ORDER — SURGIPHOR WOUND IRRIGATION SYSTEM - OPTIME
TOPICAL | Status: DC | PRN
  Administered 2020-05-06: 450 mL via TOPICAL

## 2020-05-06 MED ORDER — HYDROMORPHONE HCL 1 MG/ML IJ SOLN: 0.5000 mg | INTRAMUSCULAR | Status: DC | PRN

## 2020-05-06 MED ORDER — CELECOXIB 200 MG PO CAPS
200.0000 mg | ORAL_CAPSULE | Freq: Two times a day (BID) | ORAL | Status: DC
  Administered 2020-05-06 – 2020-05-07 (×2): 200 mg via ORAL
  Filled 2020-05-06 (×2): qty 1

## 2020-05-06 MED ORDER — ONDANSETRON HCL 4 MG/2ML IJ SOLN: 4.0000 mg | Freq: Four times a day (QID) | INTRAMUSCULAR | Status: DC | PRN

## 2020-05-06 MED ORDER — ATORVASTATIN CALCIUM 20 MG PO TABS
20.0000 mg | ORAL_TABLET | Freq: Every day | ORAL | Status: DC
  Administered 2020-05-06: 20 mg via ORAL
  Filled 2020-05-06 (×2): qty 1

## 2020-05-06 MED ORDER — ACETAMINOPHEN 10 MG/ML IV SOLN
1000.0000 mg | Freq: Four times a day (QID) | INTRAVENOUS | Status: AC
  Administered 2020-05-07 (×3): 1000 mg via INTRAVENOUS
  Filled 2020-05-06 (×3): qty 100

## 2020-05-06 MED ORDER — MAGNESIUM HYDROXIDE 400 MG/5ML PO SUSP
30.0000 mL | Freq: Every day | ORAL | Status: DC
  Administered 2020-05-07: 30 mL via ORAL
  Filled 2020-05-06: qty 30

## 2020-05-06 MED ORDER — TRANEXAMIC ACID-NACL 1000-0.7 MG/100ML-% IV SOLN: 1000.0000 mg | Freq: Once | INTRAVENOUS | Status: AC

## 2020-05-06 MED ORDER — CEFAZOLIN SODIUM-DEXTROSE 2-4 GM/100ML-% IV SOLN
2.0000 g | Freq: Four times a day (QID) | INTRAVENOUS | Status: AC
  Administered 2020-05-06: 2 g via INTRAVENOUS
  Filled 2020-05-06 (×2): qty 100

## 2020-05-06 MED ORDER — PROPOFOL 10 MG/ML IV BOLUS
INTRAVENOUS | Status: AC
  Filled 2020-05-06: qty 20

## 2020-05-06 MED ORDER — CEFAZOLIN SODIUM-DEXTROSE 2-4 GM/100ML-% IV SOLN
INTRAVENOUS | Status: AC
  Administered 2020-05-06: 2 g via INTRAVENOUS
  Filled 2020-05-06: qty 100

## 2020-05-06 MED ORDER — TRANEXAMIC ACID-NACL 1000-0.7 MG/100ML-% IV SOLN
1000.0000 mg | INTRAVENOUS | Status: AC
  Administered 2020-05-06: 1000 mg via INTRAVENOUS

## 2020-05-06 MED ORDER — ONDANSETRON HCL 4 MG PO TABS: 4.0000 mg | ORAL_TABLET | Freq: Four times a day (QID) | ORAL | Status: DC | PRN

## 2020-05-06 MED ORDER — SODIUM CHLORIDE 0.9 % IV SOLN: INTRAVENOUS | Status: DC

## 2020-05-06 MED ORDER — ENOXAPARIN SODIUM 40 MG/0.4ML ~~LOC~~ SOLN
40.0000 mg | SUBCUTANEOUS | Status: DC
  Administered 2020-05-07: 40 mg via SUBCUTANEOUS
  Filled 2020-05-06: qty 0.4

## 2020-05-06 MED ORDER — FENTANYL CITRATE (PF) 100 MCG/2ML IJ SOLN: 25.0000 ug | INTRAMUSCULAR | Status: DC | PRN

## 2020-05-06 MED ORDER — METOCLOPRAMIDE HCL 10 MG PO TABS
ORAL_TABLET | ORAL | Status: AC
  Administered 2020-05-06: 10 mg via ORAL
  Filled 2020-05-06: qty 1

## 2020-05-06 MED ORDER — SENNOSIDES-DOCUSATE SODIUM 8.6-50 MG PO TABS
1.0000 | ORAL_TABLET | Freq: Two times a day (BID) | ORAL | Status: DC
  Administered 2020-05-06 – 2020-05-07 (×3): 1 via ORAL
  Filled 2020-05-06 (×3): qty 1

## 2020-05-06 MED ORDER — BUPIVACAINE HCL (PF) 0.5 % IJ SOLN
INTRAMUSCULAR | Status: DC | PRN
  Administered 2020-05-06: 3 mL

## 2020-05-06 MED ORDER — SIMETHICONE 80 MG PO CHEW
80.0000 mg | CHEWABLE_TABLET | Freq: Four times a day (QID) | ORAL | Status: DC | PRN
  Filled 2020-05-06: qty 1

## 2020-05-06 MED ORDER — SODIUM CHLORIDE 0.9 % IV SOLN
INTRAVENOUS | Status: DC | PRN
  Administered 2020-05-06: 60 mL

## 2020-05-06 MED ORDER — OXYCODONE HCL 5 MG PO TABS
5.0000 mg | ORAL_TABLET | ORAL | Status: DC | PRN
  Administered 2020-05-06 – 2020-05-07 (×2): 5 mg via ORAL
  Filled 2020-05-06: qty 1

## 2020-05-06 MED ORDER — IRBESARTAN 150 MG PO TABS
150.0000 mg | ORAL_TABLET | Freq: Every day | ORAL | Status: DC
  Administered 2020-05-06 – 2020-05-07 (×2): 150 mg via ORAL
  Filled 2020-05-06 (×2): qty 1

## 2020-05-06 MED ORDER — BISACODYL 10 MG RE SUPP
10.0000 mg | Freq: Every day | RECTAL | Status: DC | PRN
Start: 2020-05-06 — End: 2020-05-07
  Filled 2020-05-06: qty 1

## 2020-05-06 MED ORDER — OMEGA-3-ACID ETHYL ESTERS 1 G PO CAPS
1.0000 g | ORAL_CAPSULE | Freq: Every day | ORAL | Status: DC
  Administered 2020-05-06 – 2020-05-07 (×2): 1 g via ORAL
  Filled 2020-05-06 (×2): qty 1

## 2020-05-06 MED ORDER — CELECOXIB 200 MG PO CAPS
ORAL_CAPSULE | ORAL | Status: AC
  Administered 2020-05-06: 400 mg via ORAL
  Filled 2020-05-06: qty 2

## 2020-05-06 MED ORDER — MENTHOL 3 MG MT LOZG
1.0000 | LOZENGE | OROMUCOSAL | Status: DC | PRN
  Filled 2020-05-06: qty 9

## 2020-05-06 MED ORDER — OXYCODONE HCL 5 MG PO TABS
ORAL_TABLET | ORAL | Status: AC
  Filled 2020-05-06: qty 1

## 2020-05-06 MED ORDER — BUPIVACAINE HCL (PF) 0.5 % IJ SOLN
INTRAMUSCULAR | Status: AC
  Filled 2020-05-06: qty 10

## 2020-05-06 MED ORDER — ORAL CARE MOUTH RINSE: 15.0000 mL | Freq: Once | OROMUCOSAL | Status: AC

## 2020-05-06 SURGICAL SUPPLY — 78 items
ATTUNE MED DOME PAT 41 KNEE (Knees) ×1 IMPLANT
ATTUNE PS FEM RT SZ 6 CEM KNEE (Femur) ×1 IMPLANT
ATTUNE PSRP INSR SZ6 5 KNEE (Insert) ×1 IMPLANT
BASE TIBIA ATTUNE KNEE SYS SZ6 (Knees) IMPLANT
BATTERY INSTRU NAVIGATION (MISCELLANEOUS) ×8 IMPLANT
BLADE SAW 70X12.5 (BLADE) ×2 IMPLANT
BLADE SAW 90X13X1.19 OSCILLAT (BLADE) ×2 IMPLANT
BLADE SAW 90X25X1.19 OSCILLAT (BLADE) ×2 IMPLANT
BSPLAT TIB 6 CMNT ROT PLAT STR (Knees) ×1 IMPLANT
BTRY SRG DRVR LF (MISCELLANEOUS) ×4
CANISTER PREVENA PLUS 150 (CANNISTER) ×2 IMPLANT
CANISTER SUCT 3000ML PPV (MISCELLANEOUS) ×2 IMPLANT
CEMENT HV SMART SET (Cement) ×2 IMPLANT
COOLER POLAR GLACIER W/PUMP (MISCELLANEOUS) ×2 IMPLANT
COVER WAND RF STERILE (DRAPES) ×2 IMPLANT
CUFF TOURN SGL QUICK 30 (TOURNIQUET CUFF) ×2
CUFF TRNQT CYL 30X4X21-28X (TOURNIQUET CUFF) IMPLANT
DRAPE 3/4 80X56 (DRAPES) ×2 IMPLANT
DRESSING PEEL AND PLAC PRVNA20 (GAUZE/BANDAGES/DRESSINGS) ×1 IMPLANT
DRSG DERMACEA 8X12 NADH (GAUZE/BANDAGES/DRESSINGS) ×2 IMPLANT
DRSG MEPILEX SACRM 8.7X9.8 (GAUZE/BANDAGES/DRESSINGS) ×2 IMPLANT
DRSG OPSITE POSTOP 4X14 (GAUZE/BANDAGES/DRESSINGS) ×2 IMPLANT
DRSG PEEL AND PLACE PREVENA 20 (GAUZE/BANDAGES/DRESSINGS) ×2
DRSG TEGADERM 4X4.75 (GAUZE/BANDAGES/DRESSINGS) ×2 IMPLANT
DURAPREP 26ML APPLICATOR (WOUND CARE) ×4 IMPLANT
ELECT REM PT RETURN 9FT ADLT (ELECTROSURGICAL) ×2
ELECTRODE REM PT RTRN 9FT ADLT (ELECTROSURGICAL) ×1 IMPLANT
EX-PIN ORTHOLOCK NAV 4X150 (PIN) ×4 IMPLANT
GLOVE INDICATOR 8.0 STRL GRN (GLOVE) ×2 IMPLANT
GLOVE SURG ENC MOIS LTX SZ7.5 (GLOVE) ×4 IMPLANT
GLOVE SURG ENC TEXT LTX SZ7.5 (GLOVE) ×4 IMPLANT
GLOVE SURG UNDER POLY LF SZ7.5 (GLOVE) ×2 IMPLANT
GOWN STRL REUS W/ TWL LRG LVL3 (GOWN DISPOSABLE) ×2 IMPLANT
GOWN STRL REUS W/ TWL XL LVL3 (GOWN DISPOSABLE) ×1 IMPLANT
GOWN STRL REUS W/TWL LRG LVL3 (GOWN DISPOSABLE) ×4
GOWN STRL REUS W/TWL XL LVL3 (GOWN DISPOSABLE) ×2
HEMOVAC 400CC 10FR (MISCELLANEOUS) ×2 IMPLANT
HOLDER FOLEY CATH W/STRAP (MISCELLANEOUS) ×2 IMPLANT
HOOD PEEL AWAY FLYTE STAYCOOL (MISCELLANEOUS) ×4 IMPLANT
IRRIGATION SURGIPHOR STRL (IV SOLUTION) ×2 IMPLANT
KIT PUMP PREVENA PLUS 14DAY (MISCELLANEOUS) ×2 IMPLANT
KIT TURNOVER KIT A (KITS) ×2 IMPLANT
KNIFE SCULPS 14X20 (INSTRUMENTS) ×2 IMPLANT
LABEL OR SOLS (LABEL) ×2 IMPLANT
MANIFOLD NEPTUNE II (INSTRUMENTS) ×4 IMPLANT
NDL SAFETY ECLIPSE 18X1.5 (NEEDLE) ×1 IMPLANT
NDL SPNL 20GX3.5 QUINCKE YW (NEEDLE) ×2 IMPLANT
NEEDLE HYPO 18GX1.5 SHARP (NEEDLE) ×2
NEEDLE SPNL 20GX3.5 QUINCKE YW (NEEDLE) ×4 IMPLANT
NS IRRIG 500ML POUR BTL (IV SOLUTION) ×2 IMPLANT
PACK TOTAL KNEE (MISCELLANEOUS) ×2 IMPLANT
PAD WRAPON POLAR KNEE (MISCELLANEOUS) ×1 IMPLANT
PENCIL SMOKE EVACUATOR (MISCELLANEOUS) ×2 IMPLANT
PIN DRILL QUICK PACK ×2 IMPLANT
PIN FIXATION 1/8DIA X 3INL (PIN) ×6 IMPLANT
PULSAVAC PLUS IRRIG FAN TIP (DISPOSABLE) ×2
SOL .9 NS 3000ML IRR  AL (IV SOLUTION) ×2
SOL .9 NS 3000ML IRR AL (IV SOLUTION) ×1
SOL .9 NS 3000ML IRR UROMATIC (IV SOLUTION) ×1 IMPLANT
SOL PREP PVP 2OZ (MISCELLANEOUS) ×2
SOLUTION PREP PVP 2OZ (MISCELLANEOUS) ×1 IMPLANT
SPONGE DRAIN TRACH 4X4 STRL 2S (GAUZE/BANDAGES/DRESSINGS) ×2 IMPLANT
STAPLER SKIN PROX 35W (STAPLE) ×2 IMPLANT
STOCKINETTE IMPERV 14X48 (MISCELLANEOUS) ×1 IMPLANT
STRAP TIBIA SHORT (MISCELLANEOUS) ×2 IMPLANT
SUCTION FRAZIER HANDLE 10FR (MISCELLANEOUS) ×2
SUCTION TUBE FRAZIER 10FR DISP (MISCELLANEOUS) ×1 IMPLANT
SUT VIC AB 0 CT1 36 (SUTURE) ×4 IMPLANT
SUT VIC AB 1 CT1 36 (SUTURE) ×4 IMPLANT
SUT VIC AB 2-0 CT2 27 (SUTURE) ×2 IMPLANT
SYR 20ML LL LF (SYRINGE) ×2 IMPLANT
SYR 30ML LL (SYRINGE) ×4 IMPLANT
TIBIA ATTUNE KNEE SYS BASE SZ6 (Knees) ×2 IMPLANT
TIP FAN IRRIG PULSAVAC PLUS (DISPOSABLE) ×1 IMPLANT
TOWEL OR 17X26 4PK STRL BLUE (TOWEL DISPOSABLE) ×2 IMPLANT
TOWER CARTRIDGE SMART MIX (DISPOSABLE) ×2 IMPLANT
TRAY FOLEY MTR SLVR 16FR STAT (SET/KITS/TRAYS/PACK) ×2 IMPLANT
WRAPON POLAR PAD KNEE (MISCELLANEOUS) ×2

## 2020-05-06 NOTE — Transfer of Care (Signed)
Immediate Anesthesia Transfer of Care Note  Patient: Andrew Myers  Procedure(s) Performed: COMPUTER ASSISTED TOTAL KNEE ARTHROPLASTY (Right Knee)  Patient Location: PACU  Anesthesia Type:General  Level of Consciousness: drowsy  Airway & Oxygen Therapy: Patient Spontanous Breathing  Post-op Assessment: Report given to RN and Post -op Vital signs reviewed and stable  Post vital signs: Reviewed and stable  Last Vitals:  Vitals Value Taken Time  BP 87/52 05/06/20 1115  Temp    Pulse 66 05/06/20 1117  Resp 18 05/06/20 1117  SpO2 92 % 05/06/20 1117  Vitals shown include unvalidated device data.  Last Pain:  Vitals:   05/06/20 0603  TempSrc: Temporal  PainSc: 0-No pain         Complications: No complications documented.

## 2020-05-06 NOTE — Anesthesia Procedure Notes (Signed)
Date/Time: 05/06/2020 7:32 AM Performed by: Johnna Acosta, CRNA Pre-anesthesia Checklist: Patient identified, Emergency Drugs available, Suction available, Patient being monitored and Timeout performed Patient Re-evaluated:Patient Re-evaluated prior to induction Oxygen Delivery Method: Simple face mask Preoxygenation: Pre-oxygenation with 100% oxygen Induction Type: IV induction

## 2020-05-06 NOTE — Anesthesia Procedure Notes (Signed)
Spinal  Patient location during procedure: OR Start time: 05/06/2020 7:23 AM End time: 05/06/2020 7:18 AM Staffing Performed: resident/CRNA  Anesthesiologist: Molli Barrows, MD Resident/CRNA: Johnna Acosta, CRNA Preanesthetic Checklist Completed: patient identified, IV checked, site marked, risks and benefits discussed, surgical consent, monitors and equipment checked, pre-op evaluation and timeout performed Spinal Block Patient position: sitting Prep: ChloraPrep Patient monitoring: heart rate, continuous pulse ox, blood pressure and cardiac monitor Approach: midline Location: L3-4 Injection technique: single-shot Needle Needle type: Whitacre and Introducer  Needle gauge: 24 G Needle length: 9 cm Assessment Sensory level: T10 Additional Notes Negative paresthesia. Negative blood return. Positive free-flowing CSF. Expiration date of kit checked and confirmed. Patient tolerated procedure well, without complications.

## 2020-05-06 NOTE — Op Note (Signed)
OPERATIVE NOTE  DATE OF SURGERY:  05/06/2020  PATIENT NAME:  Andrew Myers   DOB: 1945/03/12  MRN: [id number]  PRE-OPERATIVE DIAGNOSIS: Degenerative arthrosis of the right knee, primary  POST-OPERATIVE DIAGNOSIS:  Same  PROCEDURE:  Right total knee arthroplasty using computer-assisted navigation  SURGEON:  Marciano Sequin. M.D.  ASSISTANT: Cassell Smiles, PA-C (present and scrubbed throughout the case, critical for assistance with exposure, retraction, instrumentation, and closure)  ANESTHESIA: spinal  ESTIMATED BLOOD LOSS: 50 mL  FLUIDS REPLACED: 800 mL of crystalloid  TOURNIQUET TIME: 104 minutes  DRAINS: 2 medium Hemovac  SOFT TISSUE RELEASES: Anterior cruciate ligament, posterior cruciate ligament, deep medial collateral ligament, patellofemoral ligament  IMPLANTS UTILIZED: DePuy Attune size 6 posterior stabilized femoral component (cemented), size 6 rotating platform tibial component (cemented), 41 mm medialized dome patella (cemented), and a 5 mm stabilized rotating platform polyethylene insert.  INDICATIONS FOR SURGERY: Andrew Myers is a 75 y.o. year old male with a long history of progressive knee pain. X-rays demonstrated severe degenerative changes in tricompartmental fashion. The patient had not seen any significant improvement despite conservative nonsurgical intervention. After discussion of the risks and benefits of surgical intervention, the patient expressed understanding of the risks benefits and agree with plans for total knee arthroplasty.   The risks, benefits, and alternatives were discussed at length including but not limited to the risks of infection, bleeding, nerve injury, stiffness, blood clots, the need for revision surgery, cardiopulmonary complications, among others, and they were willing to proceed.  PROCEDURE IN DETAIL: The patient was brought into the operating room and, after adequate spinal anesthesia was achieved, a tourniquet was  placed on the patient's upper thigh. The patient's knee and leg were cleaned and prepped with alcohol and DuraPrep and draped in the usual sterile fashion. A "timeout" was performed as per usual protocol. The lower extremity was exsanguinated using an Esmarch, and the tourniquet was inflated to 300 mmHg. An anterior longitudinal incision was made followed by a standard mid vastus approach. The deep fibers of the medial collateral ligament were elevated in a subperiosteal fashion off of the medial flare of the tibia so as to maintain a continuous soft tissue sleeve. The patella was subluxed laterally and the patellofemoral ligament was incised. Inspection of the knee demonstrated severe degenerative changes with full-thickness loss of articular cartilage. Osteophytes were debrided using a rongeur. Anterior and posterior cruciate ligaments were excised. Two 4.0 mm Schanz pins were inserted in the femur and into the tibia for attachment of the array of trackers used for computer-assisted navigation. Hip center was identified using a circumduction technique. Distal landmarks were mapped using the computer. The distal femur and proximal tibia were mapped using the computer. The distal femoral cutting guide was positioned using computer-assisted navigation so as to achieve a 5 distal valgus cut. The femur was sized and it was felt that a size 6 femoral component was appropriate. A size 6 femoral cutting guide was positioned and the anterior cut was performed and verified using the computer. This was followed by completion of the posterior and chamfer cuts. Femoral cutting guide for the central box was then positioned in the center box cut was performed.  Attention was then directed to the proximal tibia. Medial and lateral menisci were excised. The extramedullary tibial cutting guide was positioned using computer-assisted navigation so as to achieve a 0 varus-valgus alignment and 3 posterior slope. The cut was  performed and verified using the computer. The proximal tibia was sized  and it was felt that a size 6 tibial tray was appropriate. Tibial and femoral trials were inserted followed by insertion of a 5 mm polyethylene insert.  The knee was felt to be tight both in flexion and in extension.  The trial components were removed and the extra medullary tibial cutting guide was repositioned so as to resect an additional 2 mm of bone.  The cut was performed verified using the computer.  Trial components were reinserted using the 5 mm polyethylene trial.  This allowed for excellent mediolateral soft tissue balancing both in flexion and in full extension. Finally, the patella was cut and prepared so as to accommodate a 41 mm medialized dome patella. A patella trial was placed and the knee was placed through a range of motion with excellent patellar tracking appreciated. The femoral trial was removed after debridement of posterior osteophytes. The central post-hole for the tibial component was reamed followed by insertion of a keel punch. Tibial trials were then removed. Cut surfaces of bone were irrigated with copious amounts of normal saline using pulsatile lavage and then suctioned dry. Polymethylmethacrylate cement was prepared in the usual fashion using a vacuum mixer. Cement was applied to the cut surface of the proximal tibia as well as along the undersurface of a size 6 rotating platform tibial component. Tibial component was positioned and impacted into place. Excess cement was removed using [employer] fast streamer. Cement was then applied to the cut surfaces of the femur as well as along the posterior flanges of the size 6 femoral component. The femoral component was positioned and impacted into place. Excess cement was removed using [employer] fast streamer. A 5 mm polyethylene trial was inserted and the knee was brought into full extension with steady axial compression applied. Finally, cement was applied to the backside of a 41 mm  medialized dome patella and the patellar component was positioned and patellar clamp applied. Excess cement was removed using [employer] fast streamer. After adequate curing of the cement, the tourniquet was deflated after a total tourniquet time of 104 minutes. Hemostasis was achieved using electrocautery. The knee was irrigated with copious amounts of normal saline using pulsatile lavage followed by 500 ml of Surgiphor and then suctioned dry. 20 mL of 1.3% Exparel and 60 mL of 0.25% Marcaine in 40 mL of normal saline was injected along the posterior capsule, medial and lateral gutters, and along the arthrotomy site. A 5 mm stabilized rotating platform polyethylene insert was inserted and the knee was placed through a range of motion with excellent mediolateral soft tissue balancing appreciated and excellent patellar tracking noted. 2 medium drains were placed in the wound bed and brought out through separate stab incisions. The medial parapatellar portion of the incision was reapproximated using interrupted sutures of #1 Vicryl. Subcutaneous tissue was approximated in layers using first #0 Vicryl followed #2-0 Vicryl. The skin was approximated with skin staples. A sterile dressing was applied.  The patient tolerated the procedure well and was transported to the recovery room in stable condition.    Aleric Froelich P. Holley Bouche., M.D.

## 2020-05-06 NOTE — Anesthesia Preprocedure Evaluation (Signed)
Anesthesia Evaluation  Patient identified by MRN, date of birth, ID band Patient awake    Reviewed: Allergy & Precautions, H&P , NPO status , Patient's Chart, lab work & pertinent test results, reviewed documented beta blocker date and time   Airway Mallampati: IV  TM Distance: <3 FB Neck ROM: full  Mouth opening: Limited Mouth Opening  Dental  (+) Teeth Intact   Pulmonary sleep apnea and Continuous Positive Airway Pressure Ventilation , pneumonia, resolved, former smoker,    Pulmonary exam normal        Cardiovascular Exercise Tolerance: Poor hypertension, On Medications (-) angina(-) CAD and (-) Past MI negative cardio ROS Normal cardiovascular exam(-) Valvular Problems/Murmurs Rhythm:regular Rate:Normal     Neuro/Psych negative neurological ROS  negative psych ROS   GI/Hepatic Neg liver ROS, hiatal hernia, GERD  Medicated,  Endo/Other  diabetes  Renal/GU negative Renal ROS  negative genitourinary   Musculoskeletal   Abdominal   Peds  Hematology negative hematology ROS (+)   Anesthesia Other Findings Past Medical History: No date: Allergic rhinitis No date: BPH (benign prostatic hyperplasia) 04/2019: CAP (community acquired pneumonia) No date: Empyema ([place]) 06/2019: Empyema lung ([place]) No date: Family history of adverse reaction to anesthesia     Comment:  Sister with nausea and vomiting No date: GERD (gastroesophageal reflux disease) No date: Hiatal hernia No date: History of adenomatous polyp of colon No date: History of chickenpox No date: History of kidney stones No date: History of wrist fracture No date: Hyperlipidemia No date: Hypertension No date: Obesity 05/2019: Pleural effusion on right No date: Skin cancer     Comment:  BCC, SCC No date: Sleep apnea     Comment:  Uses Bi-Pap No date: Sleep apnea Past Surgical History: No date: APPENDECTOMY No date: CATARACT EXTRACTION;  Bilateral 01/09/2020: COLONOSCOPY; N/A     Comment:  Procedure: COLONOSCOPY;  Surgeon: Lesly Rubenstein,               MD;  Location: [facility] ENDOSCOPY;  Service: Endoscopy;                Laterality: N/A; No date: EMPYEMA DRAINAGE 04/16/2020: FINE NEEDLE ASPIRATION BIOPSY     Comment:  ISTHMUS THYROID AND MIDDLE LEFT THYROID -BOTH BENIGN 2003: FRACTURE SURGERY     Comment:  repair 5th metacarpal fracture  No date: LUMBAR PUNCTURE 2016: PROSTATE BIOPSY   Reproductive/Obstetrics negative OB ROS                             Anesthesia Physical Anesthesia Plan  ASA: III  Anesthesia Plan: Spinal   Post-op Pain Management:    Induction:   PONV Risk Score and Plan: 2  Airway Management Planned:   Additional Equipment:   Intra-op Plan:   Post-operative Plan:   Informed Consent: I have reviewed the patients History and Physical, chart, labs and discussed the procedure including the risks, benefits and alternatives for the proposed anesthesia with the patient or authorized representative who has indicated his/her understanding and acceptance.     Dental Advisory Given  Plan Discussed with: CRNA  Anesthesia Plan Comments:         Anesthesia Quick Evaluation

## 2020-05-06 NOTE — Evaluation (Signed)
Physical Therapy Evaluation Patient Details Name: Andrew Myers MRN: [id number] DOB: March 05, 1945 Today's Date: 05/06/2020   History of Present Illness  Pt is a 75 yo male s/p R TKA, WBAT. PMH of sleep apnea, HTN, DM, hiatal hernia.    Clinical Impression  Pt received supine with HOB elevated and agreeable to PT services. Pt reported minor R knee pain at 2/10 NPS displaying full sensation bilaterally in LE's and ability to actively extend and flex R knee. Denies nausea, lightheadedness/dizziness. Pt educated and performed LE exercises in supine with no increase in pain and good understanding with no assist or cuing required. Pt displayed ability to independently sit EOB and denied any orthostatic symptoms. Performed STS transfer to RW with Mod-I for safety and cuing for safe hand placement on bed. Later weight shifts and standing marches performed by pt at [facility] with no LOB noted. Amb 2' with minguard to recliner for safety due to tight quarters in PACU and displayed safe sequencing to sit in recliner with appropriate hand placement. Pt educated on importance of continued mobility and use of towel roll under ankle to promote terminal knee extension. Pt and friend verbalized understanding. Pt will continue to benefit from skilled PT services to progress gait/mobility and safe navigation of stairs. Recommendation is HHPT pending patient progress with ambulation.   Follow Up Recommendations Home health PT;Follow surgeon's recommendation for DC plan and follow-up therapies    Equipment Recommendations  None recommended by PT    Recommendations for Other Services       Precautions / Restrictions Precautions Precautions: Knee Precaution Booklet Issued: No Restrictions Weight Bearing Restrictions: Yes RLE Weight Bearing: Weight bearing as tolerated      Mobility  Bed Mobility Overal bed mobility: Independent                  Transfers Overall transfer level: Modified  independent Equipment used: Rolling walker (2 wheeled)                Ambulation/Gait Ambulation/Gait assistance: Min guard Gait Distance (Feet): 2 Feet Assistive device: Rolling walker (2 wheeled) Gait Pattern/deviations: Step-to pattern Gait velocity: decreased   General Gait Details: amb to recliner. min guard for tight quarters of room.  Stairs            Wheelchair Mobility    Modified Rankin (Stroke Patients Only)       Balance Overall balance assessment: Needs assistance Sitting-balance support: Bilateral upper extremity supported Sitting balance-Leahy Scale: Good Sitting balance - Comments: Able to sit EOB without UE support. Able to manipulate objects while seated.   Standing balance support: Bilateral upper extremity supported Standing balance-Leahy Scale: Fair Standing balance comment: ABle to shift weight onto R and L LE with use of RW for UE support                             Pertinent Vitals/Pain Pain Assessment: 0-10 Pain Score: 2  Pain Location: R ant knee Pain Descriptors / Indicators: Aching Pain Intervention(s): Limited activity within patient's tolerance;Monitored during session;Repositioned;Patient requesting pain meds-RN notified;Ice applied    Home Living Family/patient expects to be discharged to:: Private residence Living Arrangements: Spouse/significant other Available Help at Discharge: Friend(s) Type of Home: House Home Access: Stairs to enter Entrance Stairs-Rails: Right Entrance Stairs-Number of Steps: 2 STE with railing on R. 5 STE front of house. Home Layout: One level;Full bath on main level Home Equipment: Shower seat -  built in;Cane - quad;Cane - single point;Crutches;Grab bars - toilet;Toilet riser;Grab bars - tub/shower;Tub bench;Walker - 2 wheels Additional Comments: Has variety of equipment to assist in improving knee ROM    Prior Function Level of Independence: Independent               Hand  Dominance        Extremity/Trunk Assessment   Upper Extremity Assessment Upper Extremity Assessment: Defer to OT evaluation    Lower Extremity Assessment Lower Extremity Assessment: RLE deficits/detail RLE Deficits / Details: Limited ROM due to R TKA RLE Sensation: WNL    Cervical / Trunk Assessment Cervical / Trunk Assessment: Normal  Communication   Communication: No difficulties  Cognition Arousal/Alertness: Awake/alert Behavior During Therapy: WFL for tasks assessed/performed Overall Cognitive Status: Within Functional Limits for tasks assessed                                        General Comments      Exercises Total Joint Exercises Ankle Circles/Pumps: AROM;Supine;20 reps;Both Quad Sets: AROM;Right;10 reps;Supine Heel Slides: AROM;Both;Supine;10 reps Goniometric ROM: -10 deg extension, 93 deg flexion Marching in Standing: AROM;Strengthening;Both;10 reps;Standing   Assessment/Plan    PT Assessment Patient needs continued PT services  PT Problem List Decreased strength;Decreased range of motion;Decreased activity tolerance;Pain;Decreased mobility       PT Treatment Interventions DME instruction;Balance training;Gait training;Neuromuscular re-education;Stair training;Functional mobility training;Patient/family education;Therapeutic activities;Therapeutic exercise    PT Goals (Current goals can be found in the Care Plan section)  Acute Rehab PT Goals Patient Stated Goal: Return to friend's house PT Goal Formulation: With patient Time For Goal Achievement: 05/20/20 Potential to Achieve Goals: Good    Frequency BID   Barriers to discharge        Co-evaluation               AM-PAC PT "6 Clicks" Mobility  Outcome Measure Help needed turning from your back to your side while in a flat bed without using bedrails?: None Help needed moving from lying on your back to sitting on the side of a flat bed without using bedrails?: None Help  needed moving to and from a bed to a chair (including a wheelchair)?: A Little Help needed standing up from a chair using your arms (e.g., wheelchair or bedside chair)?: A Little Help needed to walk in hospital room?: A Little Help needed climbing 3-5 steps with a railing? : A Lot 6 Click Score: 19    End of Session Equipment Utilized During Treatment: Gait belt Activity Tolerance: Patient tolerated treatment well Patient left: in chair;with call bell/phone within reach;with nursing/sitter in room;with family/visitor present;with SCD's reapplied Nurse Communication: Mobility status PT Visit Diagnosis: Difficulty in walking, not elsewhere classified (R26.2);Other abnormalities of gait and mobility (R26.89);Muscle weakness (generalized) (M62.81)    Time: 0354-6568 PT Time Calculation (min) (ACUTE ONLY): 30 min   Charges:   PT Evaluation $PT Eval Low Complexity: 1 Low PT Treatments $Therapeutic Exercise: 8-22 mins       Shauntea Lok M. Fairly IV, PT, DPT 05/06/2020, 4:42 PM

## 2020-05-06 NOTE — H&P (Signed)
The patient has been re-examined, and the chart reviewed, and there have been no interval changes to the documented history and physical.    The risks, benefits, and alternatives have been discussed at length. The patient expressed understanding of the risks benefits and agreed with plans for surgical intervention.  Carmen Tolliver P. Khaled Herda, Jr. M.D.    

## 2020-05-07 MED ORDER — ENOXAPARIN SODIUM 40 MG/0.4ML ~~LOC~~ SOLN: 40.0000 mg | SUBCUTANEOUS | 0 refills | Status: DC

## 2020-05-07 MED ORDER — CELECOXIB 200 MG PO CAPS: 200.0000 mg | ORAL_CAPSULE | Freq: Two times a day (BID) | ORAL | 0 refills | Status: DC

## 2020-05-07 MED ORDER — OXYCODONE HCL 5 MG PO TABS: 5.0000 mg | ORAL_TABLET | ORAL | 0 refills | Status: DC | PRN

## 2020-05-07 NOTE — Anesthesia Postprocedure Evaluation (Signed)
Anesthesia Post Note  Patient: Andrew Myers  Procedure(s) Performed: COMPUTER ASSISTED TOTAL KNEE ARTHROPLASTY (Right Knee)  Patient location during evaluation: Nursing Unit Anesthesia Type: Spinal Level of consciousness: awake, oriented and awake and alert Pain management: pain level controlled Vital Signs Assessment: post-procedure vital signs reviewed and stable Respiratory status: spontaneous breathing, respiratory function stable and nonlabored ventilation Cardiovascular status: blood pressure returned to baseline Postop Assessment: no headache and no backache Anesthetic complications: no   No complications documented.   Last Vitals:  Vitals:   05/06/20 2343 05/07/20 0432  BP: (!) 156/72 138/71  Pulse: 77 72  Resp: 16 16  Temp: 36.6 C 36.7 C  SpO2: 97% 98%    Last Pain:  Vitals:   05/06/20 2046  TempSrc:   PainSc: 1                  Proofreader

## 2020-05-07 NOTE — Discharge Summary (Signed)
Physician Discharge Summary  Patient ID: Andrew Myers MRN: [id number] DOB/AGE: Jul 24, 1945 74 y.o.  Admit date: 05/06/2020 Discharge date: 05/07/2020  Admission Diagnoses:  Total knee replacement status [Z96.659]  Surgeries:Procedure(s): Right total knee arthroplasty using computer-assisted navigation  SURGEON:  Marciano Sequin. M.D.  ASSISTANT: Cassell Smiles, PA-C (present and scrubbed throughout the case, critical for assistance with exposure, retraction, instrumentation, and closure)  ANESTHESIA: spinal  ESTIMATED BLOOD LOSS: 50 mL  FLUIDS REPLACED: 800 mL of crystalloid  TOURNIQUET TIME: 104 minutes  DRAINS: 2 medium Hemovac  SOFT TISSUE RELEASES: Anterior cruciate ligament, posterior cruciate ligament, deep medial collateral ligament, patellofemoral ligament  IMPLANTS UTILIZED: DePuy Attune size 6 posterior stabilized femoral component (cemented), size 6 rotating platform tibial component (cemented), 41 mm medialized dome patella (cemented), and a 5 mm stabilized rotating platform polyethylene insert.  Discharge Diagnoses: Patient Active Problem List   Diagnosis Date Noted   Total knee replacement status 05/06/2020   Primary osteoarthritis of left knee 12/17/2019   Primary osteoarthritis of right knee 12/17/2019   Neck pain 07/18/2019   Empyema ([place]) 07/18/2019   Joint pain 07/18/2019   Hypokalemia 07/05/2019   Hyperglycemia 07/05/2019   Empyema of right pleural space ([place]) 07/05/2019   Hypertension    Hyperlipidemia    BPH (benign prostatic hyperplasia)    Obesity    Sleep apnea    Benign prostatic hyperplasia (BPH) with urinary urgency 05/02/2019   Elevated PSA 10/27/2018   Type 2 diabetes mellitus without complication, without long-term current use of insulin ([place]) 10/27/2018   Benign essential hypertension 09/11/2013   GERD (gastroesophageal reflux disease) 09/11/2013    Past Medical History:  Diagnosis Date    Allergic rhinitis    BPH (benign prostatic hyperplasia)    CAP (community acquired pneumonia) 04/2019   Empyema ([place])    Empyema lung ([place]) 06/2019   Family history of adverse reaction to anesthesia    Sister with nausea and vomiting   GERD (gastroesophageal reflux disease)    Hiatal hernia    History of adenomatous polyp of colon    History of chickenpox    History of kidney stones    History of wrist fracture    Hyperlipidemia    Hypertension    Obesity    Pleural effusion on right 05/2019   Skin cancer    BCC, SCC   Sleep apnea    Uses Bi-Pap   Sleep apnea      Transfusion:    Consultants (if any):   Discharged Condition: Improved  Hospital Course: Andrew Myers is an 75 y.o. male who was admitted 05/06/2020 with a diagnosis of right knee osteoarthritis and went to the operating room on 05/06/2020 and underwent right total knee arthroplasty. The patient received perioperative antibiotics for prophylaxis (see below). The patient tolerated the procedure well and was transported to PACU in stable condition. After meeting PACU criteria, the patient was subsequently transferred to the Orthopaedics/Rehabilitation unit.   The patient received DVT prophylaxis in the form of early mobilization, Lovenox, Foot Pumps and TED hose. A sacral pad had been placed and heels were elevated off of the bed with rolled towels in order to protect skin integrity. Foley catheter was discontinued on postoperative day #0. Wound drains were discontinued on postoperative day #1. The surgical incision was healing well without signs of infection.  Physical therapy was initiated postoperatively for transfers, gait training, and strengthening. Occupational therapy was initiated for activities of daily living and evaluation for assisted  devices. Rehabilitation goals were reviewed in detail with the patient. The patient made steady progress with physical therapy and physical therapy  recommended discharge to Home.   The patient achieved the preliminary goals of this hospitalization and was felt to be medically and orthopaedically appropriate for discharge.  He was given perioperative antibiotics:  Anti-infectives (From admission, onward)   Start     Dose/Rate Route Frequency Ordered Stop   05/06/20 1400  ceFAZolin (ANCEF) IVPB 2g/100 mL premix        2 g 200 mL/hr over 30 Minutes Intravenous Every 6 hours 05/06/20 1232 05/06/20 2114   05/06/20 0609  ceFAZolin (ANCEF) 2-4 GM/100ML-% IVPB       Note to Pharmacy: Lyman Bishop   : cabinet override      05/06/20 0609 05/06/20 1414   05/06/20 0600  ceFAZolin (ANCEF) IVPB 2g/100 mL premix        2 g 200 mL/hr over 30 Minutes Intravenous On call to O.R. 05/06/20 0217 05/06/20 0754    .  Recent vital signs:  Vitals:   05/07/20 0808 05/07/20 1121  BP: 137/73 139/62  Pulse: 73 68  Resp: 16 18  Temp: 97.8 F (36.6 C) 97.8 F (36.6 C)  SpO2: 97% 96%    Recent laboratory studies:  Recent Labs    05/06/20 0533  HGB 13.6  HCT 40.0  K 3.1*  CL 97*  BUN 15  CREATININE 0.90  GLUCOSE 145*    Diagnostic Studies: DG Knee Right Port  Result Date: 05/06/2020 CLINICAL DATA:  Patient status post right knee replacement today. EXAM: PORTABLE RIGHT KNEE - 1-2 VIEW COMPARISON:  None. FINDINGS: Total knee arthroplasty is in place. The device is located. No fracture or other acute abnormality. Surgical drain and staples are noted. IMPRESSION: Status post right knee replacement.  No acute finding. Electronically Signed   By: Inge Rise M.D.   On: 05/06/2020 11:37    Discharge Medications:   Allergies as of 05/07/2020      Reactions   Penicillins Other (See Comments)   FEBRILE SERUM SICKNESS WITH POLYARTICULAR ARTHRALGIAS IgE = 41 (WNL) 02/20/2020   Augmentin [amoxicillin-pot Clavulanate]    FEBRILE SERUM SICKNESS WITH POLYARTICULAR ARTHRALGIAS IgE = 41 (WNL) 02/20/2020   Codeine Rash   Wound Dressing Adhesive  Rash      Medication List    STOP taking these medications   aspirin EC 81 MG tablet   potassium chloride SA 20 MEQ tablet Commonly known as: KLOR-CON     TAKE these medications   atorvastatin 20 MG tablet Commonly known as: LIPITOR Take 20 mg by mouth at bedtime.   bisacodyl 5 MG EC tablet Commonly known as: DULCOLAX Take 5 mg by mouth daily as needed for moderate constipation.   celecoxib 200 MG capsule Commonly known as: CELEBREX Take 1 capsule (200 mg total) by mouth 2 (two) times daily. What changed: when to take this   cetirizine 10 MG tablet Commonly known as: ZYRTEC Take 10 mg by mouth daily.   cholecalciferol 25 MCG (1000 UNIT) tablet Commonly known as: VITAMIN D3 Take 1,000 Units by mouth daily.   diphenhydrAMINE 25 mg capsule Commonly known as: BENADRYL Take 25 mg by mouth every 6 (six) hours as needed.   enoxaparin 40 MG/0.4ML injection Commonly known as: LOVENOX Inject 0.4 mLs (40 mg total) into the skin daily for 14 days.   Fish Oil 1200 MG Caps Take 1 capsule by mouth daily.   fluticasone 50 MCG/ACT  nasal spray Commonly known as: FLONASE Place 1 spray into both nostrils daily.   hydrochlorothiazide 25 MG tablet Commonly known as: HYDRODIURIL Take 2 tablets (50 mg total) by mouth daily.   Melatonin 10 MG Tabs Take 1 tablet by mouth at bedtime as needed (INSOMNIA).   miconazole 2 % powder Commonly known as: MICOTIN Apply 1 application topically 2 (two) times daily as needed for itching.   Multi-Vitamin tablet Take 1 tablet by mouth daily.   nebivolol 5 MG tablet Commonly known as: BYSTOLIC Take 5 mg by mouth daily.   neomycin-bacitracin-polymyxin [phone] ointment Apply 1 application topically 4 (four) times daily as needed.   omeprazole 20 MG capsule Commonly known as: PRILOSEC Take 20 mg by mouth daily.   oxyCODONE 5 MG immediate release tablet Commonly known as: Oxy IR/ROXICODONE Take 1 tablet (5 mg total) by mouth every 4  (four) hours as needed for moderate pain (pain score 4-6).   Percogesic Backache Relief 580 MG Tabs Generic drug: Magnesium Salicylate Tetrahyd Take 2 tablets by mouth every 6 (six) hours as needed.   polyethylene glycol powder 17 GM/SCOOP powder Commonly known as: GLYCOLAX/MIRALAX Take 17 g by mouth daily as needed for mild constipation.   PROBIOTIC DAILY PO Take 1 capsule by mouth daily.   pseudoephedrine 30 MG tablet Commonly known as: SUDAFED Take 30 mg by mouth every 4 (four) hours as needed for congestion.   sildenafil 20 MG tablet Commonly known as: REVATIO Take 20-100 mg by mouth daily as needed (ED).   simethicone 125 MG chewable tablet Commonly known as: MYLICON Chew 295 mg by mouth every 6 (six) hours as needed for flatulence.   sodium chloride 0.65 % Soln nasal spray Commonly known as: OCEAN Place 1 spray into both nostrils as needed for congestion.   SYSTANE OP Place 1 drop into both eyes.   tamsulosin 0.4 MG Caps capsule Commonly known as: FLOMAX Take 0.8 mg by mouth in the morning and at bedtime.   valsartan 160 MG tablet Commonly known as: DIOVAN Take 160 mg by mouth daily.   Vitamin A & D 5000-400 units Caps Take 1 capsule by mouth daily.   vitamin C 1000 MG tablet Take 1,000 mg by mouth in the morning and at bedtime.   ZICAM COLD REMEDY PO Take 1 tablet by mouth every 4 (four) hours as needed. COLD SYMPTOMS   [employer] Leg Cramps Pain Relief Tabs Take 1 tablet by mouth every 4 (four) hours as needed.            Durable Medical Equipment  (From admission, onward)         Start     Ordered   05/06/20 1323  DME Walker rolling  Once       Question:  Patient needs a walker to treat with the following condition  Answer:  Total knee replacement status   05/06/20 1322   05/06/20 1323  DME Bedside commode  Once       Question:  Patient needs a bedside commode to treat with the following condition  Answer:  Total knee replacement status   05/06/20  1322          Disposition: home with home health PT      Follow-up Information    [facility] On 05/21/2020.   Specialty: Orthopedic Surgery Why: at 9:45am Contact information: [address] [place] [address] [phone]  Dereck Leep, MD On 06/18/2020.   Specialty: Orthopedic Surgery Why: at 9:45am Contact information: [place] [address] [place], PA-C 05/07/2020, 1:49 PM

## 2020-05-07 NOTE — Progress Notes (Signed)
Pt d/c home via POV.  D/c instructions reviewed and follow up appointment scheduled, pt expressed understanding.  All belongings were taken at time of d/c including polar care, bone foam, incentive spirometer, and two dressings.  IV removed from pts R hand without issue.  Lovenox education was given this AM during morning med pass pt feels comfortable with self-injecting.

## 2020-05-07 NOTE — Progress Notes (Signed)
Physical Therapy Treatment Patient Details Name: Andrew Myers MRN: [id number] DOB: 02/19/1946 Today's Date: 05/07/2020    History of Present Illness Pt is a 75 yo male s/p R TKA, WBAT. PMH of sleep apnea, HTN, DM, hiatal hernia.    PT Comments    Pt was long sitting in bed with bone foam in place and polar care donned. He agrees to PT session and is cooperative and pleasant throughout. Pt was able to exit bed, stand, and ambulate with RW without difficulty. Performed stair training with supervision only. No safety concerns throughout session. Issued HEP and pt performed. 3-93 degrees AROM. Pt has performed all goals needed to safely DC home once deemed ready. Acute PT will continue to follow and progress per POC. He was seated in recliner with towel roll in place and polar care running at conclusion of session.     Follow Up Recommendations  Home health PT     Equipment Recommendations  None recommended by PT    Recommendations for Other Services       Precautions / Restrictions Precautions Precautions: Knee Precaution Booklet Issued: Yes (comment) Restrictions Weight Bearing Restrictions: Yes RLE Weight Bearing: Weight bearing as tolerated    Mobility  Bed Mobility Overal bed mobility: Independent     Transfers Overall transfer level: Modified independent   Ambulation/Gait Ambulation/Gait assistance: Supervision Gait Distance (Feet): 200 Feet Assistive device: Rolling walker (2 wheeled) Gait Pattern/deviations: Step-through pattern Gait velocity: decreased   General Gait Details: Pt was easily able to ambulate 200 + ft without LOB or unsteadiness   Stairs Stairs: Yes Stairs assistance: Supervision Stair Management: One rail Right;Step to pattern Number of Stairs: 4 General stair comments: Pt was safely able to ascend/descend stairs with proper performance. No difficulty. was demonstrated proper performance for getting into/out of car/truck        Balance Overall balance assessment: Modified Independent         Cognition Arousal/Alertness: Awake/alert Behavior During Therapy: WFL for tasks assessed/performed Overall Cognitive Status: Within Functional Limits for tasks assessed      General Comments: Pt is A and O x 4      Exercises Total Joint Exercises Ankle Circles/Pumps: AROM;Supine;20 reps;Both Quad Sets: AROM;Right;10 reps;Supine Heel Slides: AROM;Both;Supine;10 reps Hip ABduction/ADduction: AROM;10 reps Straight Leg Raises: AROM;10 reps Long Arc Quad: AROM;10 reps Knee Flexion: Seated Goniometric ROM: 93        Pertinent Vitals/Pain Pain Assessment: 0-10 Pain Score: 2  Pain Location: R knee Pain Descriptors / Indicators: Aching Pain Intervention(s): Limited activity within patient's tolerance;Monitored during session;Premedicated before session           PT Goals (current goals can now be found in the care plan section) Acute Rehab PT Goals Patient Stated Goal: home to significant others Progress towards PT goals: Progressing toward goals    Frequency    BID      PT Plan Current plan remains appropriate       AM-PAC PT "6 Clicks" Mobility   Outcome Measure  Help needed turning from your back to your side while in a flat bed without using bedrails?: None Help needed moving from lying on your back to sitting on the side of a flat bed without using bedrails?: None Help needed moving to and from a bed to a chair (including a wheelchair)?: None Help needed standing up from a chair using your arms (e.g., wheelchair or bedside chair)?: None Help needed to walk in hospital room?: None Help needed climbing  3-5 steps with a railing? : None 6 Click Score: 24    End of Session Equipment Utilized During Treatment: Gait belt Activity Tolerance: Patient tolerated treatment well Patient left: in chair;with call bell/phone within reach;with nursing/sitter in room;with family/visitor present;with SCD's  reapplied Nurse Communication: Mobility status PT Visit Diagnosis: Difficulty in walking, not elsewhere classified (R26.2);Other abnormalities of gait and mobility (R26.89);Muscle weakness (generalized) (M62.81)     Time: 4360-6770 PT Time Calculation (min) (ACUTE ONLY): 47 min  Charges:  $Gait Training: 23-37 mins $Therapeutic Exercise: 8-22 mins                     Julaine Fusi PTA 05/07/20, 9:59 AM

## 2020-05-07 NOTE — Progress Notes (Addendum)
  Subjective: 1 Day Post-Op Procedure(s) (LRB): COMPUTER ASSISTED TOTAL KNEE ARTHROPLASTY (Right) Patient reports pain as well-controlled.   Patient is well, and has had no acute complaints or problems Plan is to go Home after hospital stay. Negative for chest pain and shortness of breath Fever: no Gastrointestinal: negative for nausea and vomiting.   Patient has had a bowel movement.  Objective: Vital signs in last 24 hours: Temp:  [97.3 F (36.3 C)-98.1 F (36.7 C)] 97.8 F (36.6 C) (03/15 0808) Pulse Rate:  [60-84] 73 (03/15 0808) Resp:  [15-19] 16 (03/15 0808) BP: (87-156)/(52-73) 137/73 (03/15 0808) SpO2:  [91 %-98 %] 97 % (03/15 0808) Weight:  [107.7 kg] 107.7 kg (03/14 1649)  Intake/Output from previous day:  Intake/Output Summary (Last 24 hours) at 05/07/2020 0811 Last data filed at 05/07/2020 0626 Gross per 24 hour  Intake 2507.35 ml  Output 2376 ml  Net 131.35 ml    Intake/Output this shift: No intake/output data recorded.  Labs: Recent Labs    05/06/20 0533  HGB 13.6   Recent Labs    05/06/20 0533  HCT 40.0   Recent Labs    05/06/20 0533  NA 139  K 3.1*  CL 97*  BUN 15  CREATININE 0.90  GLUCOSE 145*   No results for input(s): LABPT, INR in the last 72 hours.   EXAM General - Patient is Alert, Appropriate and Oriented Extremity - Neurovascular intact Dorsiflexion/Plantar flexion intact Compartment soft Dressing/Incision -Postoperative dressing remains in place., Polar Care in place and working. , Hemovac in place.  Motor Function - intact, moving foot and toes well on exam.  Cardiovascular- Regular rate and rhythm, no murmurs/rubs/gallops Respiratory- Lungs clear to auscultation bilaterally Gastrointestinal- soft, nontender and active bowel sounds   Assessment/Plan: 1 Day Post-Op Procedure(s) (LRB): COMPUTER ASSISTED TOTAL KNEE ARTHROPLASTY (Right) Active Problems:   Total knee replacement status  Estimated body mass index is 35.06  kg/m as calculated from the following:   Height as of this encounter: 5\' 9"  (1.753 m).   Weight as of this encounter: 107.7 kg. Advance diet Up with therapy    DVT Prophylaxis - Lovenox Weight-Bearing as tolerated to right leg  Cassell Smiles, PA-C [facility] Orthopaedic Surgery 05/07/2020, 8:11 AM

## 2020-05-07 NOTE — TOC Transition Note (Signed)
Transition of Care [facility]) - CM/SW Discharge Note   Patient Details  Name: Taevin Mcferran MRN: [id number] Date of Birth: December 18, 1945  Transition of Care [facility]) CM/SW Contact:  Pete Pelt, RN Phone Number: 05/07/2020, 5:06 PM   Clinical Narrative:   TOC at bedside to discuss discharge with patient.  Patient will be going home with home health, [facility], amenable to plan.  NO DME needs, as patient has all recommended equipment at home.  States no concerns with transportation to appointments or obtaining medications.  No further questions or concerns.  TOC signing off    Final next level of care: [place] Barriers to Discharge: Barriers Resolved   Patient Goals and CMS Choice     Choice offered to / list presented to : NA  Discharge Placement                  Name of family member notified: Patient notified    Discharge Plan and Services                        Representative spoke with at DME Agency: NO DME Needed HH Arranged: PT [place]: [facility] [employer] Date [place]: 05/07/20   Representative spoke with at [place]: [place] ([place]) Interventions     Readmission Risk Interventions No flowsheet data found.

## 2020-05-07 NOTE — Evaluation (Signed)
Occupational Therapy Evaluation Patient Details Name: Andrew Myers MRN: [id number] DOB: 12/11/1945 Today's Date: 05/07/2020    History of Present Illness Pt is a 75 yo male s/p R TKA, WBAT. PMH of sleep apnea, HTN, DM, hiatal hernia.   Clinical Impression   Pt agreeable to OT intervention. Pt reports being independent PTA. He lives with sister but will be going to stay with his partner at discharge. Pt demonstrated ability to perform self care tasks and functional ambulation with use of RW and supervision progressing to Mod I. Pt has reacher and sock aid for LB self care at home and is familiar with how to utilize. OT reviewed polar care and provided pt with paper handout as well. Pt with no further concerns at this time. OT to SIGN OFF. Pt is agreeable. Thank you for this referral.     Follow Up Recommendations  No OT follow up;Supervision - Intermittent    Equipment Recommendations  None recommended by OT       Precautions / Restrictions Precautions Precautions: Knee Precaution Booklet Issued: Yes (comment) Restrictions Weight Bearing Restrictions: Yes RLE Weight Bearing: Weight bearing as tolerated      Mobility Bed Mobility Overal bed mobility: Independent             General bed mobility comments: seated in recliner chair upon entering the room    Transfers Overall transfer level: Needs assistance Equipment used: Rolling walker (2 wheeled) Transfers: Sit to/from [employer] Transfers Sit to Stand: Supervision Stand pivot transfers: Supervision       General transfer comment: supervision for safety progressing to Mod I. No physical assistance needed    Balance Overall balance assessment: Modified Independent Sitting-balance support: Bilateral upper extremity supported       Standing balance support: Bilateral upper extremity supported Standing balance-Leahy Scale: Fair Standing balance comment: ABle to shift weight onto R and L LE with use  of RW for UE support                           ADL either performed or assessed with clinical judgement   ADL Overall ADL's : Needs assistance/impaired     Grooming: Wash/dry hands;Oral care;Standing;Supervision/safety               Lower Body Dressing: Minimal assistance               Functional mobility during ADLs: Supervision/safety;Rolling walker       Vision Baseline Vision/History: Wears glasses Patient Visual Report: No change from baseline              Pertinent Vitals/Pain Pain Assessment: 0-10 Pain Score: 2  Pain Location: R knee Pain Descriptors / Indicators: Aching Pain Intervention(s): Premedicated before session;Monitored during session;Limited activity within patient's tolerance;Ice applied     Hand Dominance Right   Extremity/Trunk Assessment Upper Extremity Assessment Upper Extremity Assessment: Overall WFL for tasks assessed   Lower Extremity Assessment Lower Extremity Assessment: RLE deficits/detail RLE Deficits / Details: Limited ROM due to R TKA RLE Sensation: WNL   Cervical / Trunk Assessment Cervical / Trunk Assessment: Normal   Communication Communication Communication: No difficulties   Cognition Arousal/Alertness: Awake/alert Behavior During Therapy: WFL for tasks assessed/performed Overall Cognitive Status: Within Functional Limits for tasks assessed  General Comments: Pt is A and O x 4 and very pleasant and motivated.      Exercises Exercises: Total Joint Total Joint Exercises Ankle Circles/Pumps: AROM;Supine;20 reps;Both Quad Sets: AROM;Right;10 reps;Supine Heel Slides: AROM;Both;Supine;10 reps Hip ABduction/ADduction: AROM;10 reps Straight Leg Raises: AROM;10 reps Long Arc Quad: AROM;10 reps Knee Flexion: Seated Goniometric ROM: 93        Home Living Family/patient expects to be discharged to:: Private residence Living Arrangements:  Spouse/significant other Available Help at Discharge: Friend(s) Type of Home: House Home Access: Stairs to enter [employer] of Steps: 2 STE with railing on R. 5 STE front of house. Entrance Stairs-Rails: Right Home Layout: One level;Full bath on main level     Bathroom Shower/Tub: Occupational psychologist: Handicapped height Bathroom Accessibility: Yes   Home Equipment: Shower seat - built in;Cane - quad;Cane - single point;Crutches;Grab bars - toilet;Toilet riser;Grab bars - tub/shower;Tub bench;Walker - 2 wheels;Bedside commode          Prior Functioning/Environment Level of Independence: Independent                          OT Goals(Current goals can be found in the care plan section) Acute Rehab OT Goals Patient Stated Goal: home with partner OT Goal Formulation: With patient Time For Goal Achievement: 05/21/20 Potential to Achieve Goals: Good   AM-PAC OT "6 Clicks" Daily Activity     Outcome Measure Help from another person eating meals?: None Help from another person taking care of personal grooming?: None Help from another person toileting, which includes using toliet, bedpan, or urinal?: None Help from another person bathing (including washing, rinsing, drying)?: None Help from another person to put on and taking off regular upper body clothing?: None Help from another person to put on and taking off regular lower body clothing?: None 6 Click Score: 24   End of Session Equipment Utilized During Treatment: Rolling walker Nurse Communication: Mobility status  Activity Tolerance: Patient tolerated treatment well Patient left: in chair;with call bell/phone within reach                   Time: 0934-0959 OT Time Calculation (min): 25 min Charges:  OT General Charges $OT Visit: 1 Visit OT Evaluation $OT Eval Low Complexity: 1 Low OT Treatments $Self Care/Home Management : 8-22 mins  Darleen Crocker, MS, OTR/L , CBIS ascom  [phone]  05/07/20, 12:05 PM

## 2020-05-08 ENCOUNTER — Encounter: Payer: Self-pay | Admitting: Orthopedic Surgery

## 2020-05-08 DIAGNOSIS — Z8701 Personal history of pneumonia (recurrent): Secondary | ICD-10-CM | POA: Diagnosis not present

## 2020-05-08 DIAGNOSIS — M1712 Unilateral primary osteoarthritis, left knee: Secondary | ICD-10-CM | POA: Diagnosis not present

## 2020-05-08 DIAGNOSIS — E669 Obesity, unspecified: Secondary | ICD-10-CM | POA: Diagnosis not present

## 2020-05-08 DIAGNOSIS — M542 Cervicalgia: Secondary | ICD-10-CM | POA: Diagnosis not present

## 2020-05-08 DIAGNOSIS — Z87440 Personal history of urinary (tract) infections: Secondary | ICD-10-CM | POA: Diagnosis not present

## 2020-05-08 DIAGNOSIS — N400 Enlarged prostate without lower urinary tract symptoms: Secondary | ICD-10-CM | POA: Diagnosis not present

## 2020-05-08 DIAGNOSIS — Z96651 Presence of right artificial knee joint: Secondary | ICD-10-CM | POA: Diagnosis not present

## 2020-05-08 DIAGNOSIS — G4730 Sleep apnea, unspecified: Secondary | ICD-10-CM | POA: Diagnosis not present

## 2020-05-08 DIAGNOSIS — Z85828 Personal history of other malignant neoplasm of skin: Secondary | ICD-10-CM | POA: Diagnosis not present

## 2020-05-08 DIAGNOSIS — J869 Pyothorax without fistula: Secondary | ICD-10-CM | POA: Diagnosis not present

## 2020-05-08 DIAGNOSIS — Z86010 Personal history of colonic polyps: Secondary | ICD-10-CM | POA: Diagnosis not present

## 2020-05-08 DIAGNOSIS — E785 Hyperlipidemia, unspecified: Secondary | ICD-10-CM | POA: Diagnosis not present

## 2020-05-08 DIAGNOSIS — Z471 Aftercare following joint replacement surgery: Secondary | ICD-10-CM | POA: Diagnosis not present

## 2020-05-08 DIAGNOSIS — I10 Essential (primary) hypertension: Secondary | ICD-10-CM | POA: Diagnosis not present

## 2020-05-08 DIAGNOSIS — E119 Type 2 diabetes mellitus without complications: Secondary | ICD-10-CM | POA: Diagnosis not present

## 2020-05-08 DIAGNOSIS — K219 Gastro-esophageal reflux disease without esophagitis: Secondary | ICD-10-CM | POA: Diagnosis not present

## 2020-05-15 DIAGNOSIS — G4733 Obstructive sleep apnea (adult) (pediatric): Secondary | ICD-10-CM | POA: Diagnosis not present

## 2020-05-21 DIAGNOSIS — M25461 Effusion, right knee: Secondary | ICD-10-CM | POA: Diagnosis not present

## 2020-05-21 DIAGNOSIS — M25561 Pain in right knee: Secondary | ICD-10-CM | POA: Diagnosis not present

## 2020-05-23 DIAGNOSIS — M25461 Effusion, right knee: Secondary | ICD-10-CM | POA: Diagnosis not present

## 2020-05-23 DIAGNOSIS — M25561 Pain in right knee: Secondary | ICD-10-CM | POA: Diagnosis not present

## 2020-05-24 DIAGNOSIS — Z471 Aftercare following joint replacement surgery: Secondary | ICD-10-CM | POA: Diagnosis not present

## 2020-05-28 DIAGNOSIS — R29898 Other symptoms and signs involving the musculoskeletal system: Secondary | ICD-10-CM | POA: Diagnosis not present

## 2020-05-31 DIAGNOSIS — R29898 Other symptoms and signs involving the musculoskeletal system: Secondary | ICD-10-CM | POA: Diagnosis not present

## 2020-06-03 DIAGNOSIS — M25562 Pain in left knee: Secondary | ICD-10-CM | POA: Diagnosis not present

## 2020-06-05 DIAGNOSIS — R29898 Other symptoms and signs involving the musculoskeletal system: Secondary | ICD-10-CM | POA: Diagnosis not present

## 2020-06-11 DIAGNOSIS — R29898 Other symptoms and signs involving the musculoskeletal system: Secondary | ICD-10-CM | POA: Diagnosis not present

## 2020-06-19 DIAGNOSIS — R29898 Other symptoms and signs involving the musculoskeletal system: Secondary | ICD-10-CM | POA: Diagnosis not present

## 2020-06-21 DIAGNOSIS — Z96651 Presence of right artificial knee joint: Secondary | ICD-10-CM | POA: Diagnosis not present

## 2020-08-14 DIAGNOSIS — G4733 Obstructive sleep apnea (adult) (pediatric): Secondary | ICD-10-CM | POA: Diagnosis not present

## 2020-10-11 ENCOUNTER — Encounter (INDEPENDENT_AMBULATORY_CARE_PROVIDER_SITE_OTHER): Payer: Self-pay

## 2020-10-13 NOTE — Discharge Instructions (Signed)
Instructions after Total Knee Replacement   Ofelia Podolski P. Gabriel Paulding, Jr., M.D.     Dept. of Orthopaedics & Sports Medicine  [facility]  [address]  [place], [place]  [address]  Phone: [phone]   Fax: [phone]    DIET: Drink plenty of non-alcoholic fluids. Resume your normal diet. Include foods high in fiber.  ACTIVITY:  You may use crutches or a walker with weight-bearing as tolerated, unless instructed otherwise. You may be weaned off of the walker or crutches by your Physical Therapist.  Do NOT place pillows under the knee. Anything placed under the knee could limit your ability to straighten the knee.   Continue doing gentle exercises. Exercising will reduce the pain and swelling, increase motion, and prevent muscle weakness.   Please continue to use the TED compression stockings for 6 weeks. You may remove the stockings at night, but should reapply them in the morning. Do not drive or operate any equipment until instructed.  WOUND CARE:  Continue to use the PolarCare or ice packs periodically to reduce pain and swelling. You may bathe or shower after the staples are removed at the first office visit following surgery.  MEDICATIONS: You may resume your regular medications. Please take the pain medication as prescribed on the medication. Do not take pain medication on an empty stomach. You have been given a prescription for a blood thinner (Lovenox or Coumadin). Please take the medication as instructed. (NOTE: After completing a 2 week course of Lovenox, take one Enteric-coated aspirin once a day. This along with elevation will help reduce the possibility of phlebitis in your operated leg.) Do not drive or drink alcoholic beverages when taking pain medications.  CALL THE OFFICE FOR: Temperature above 101 degrees Excessive bleeding or drainage on the dressing. Excessive swelling, coldness, or paleness of the toes. Persistent nausea and vomiting.  FOLLOW-UP:  You  should have an appointment to return to the office in 10-14 days after surgery. Arrangements have been made for continuation of Physical Therapy (either home therapy or outpatient therapy).   [facility] Department Directory         [email or web address]       [email or web address]          Cardiology  Appointments: [place] - [phone] [place] - [phone]  Endocrinology  Appointments: [place] - [phone] [place] - [phone]  Gastroenterology  Appointments: [place] - [phone] [place] - [phone]        General Surgery   Appointments: [place] - [phone]  Internal Medicine/Family Medicine  Appointments: [place] - [phone] [place] - [phone] [place] - [phone]  Metabolic and Weigh Loss Surgery  Appointments: [place] - [phone]        Neurology  Appointments: [place] - [phone] [place] - [phone]  Neurosurgery  Appointments: [place] - [phone]  Obstetrics & Gynecology  Appointments: [place] - [phone] [place] - [phone]        Pediatrics  Appointments: [place] - [phone] [place] - [phone]  Physiatry  Appointments: [place] -[phone]  Physical Therapy  Appointments: [place] - [phone] [place] - [phone]        Podiatry  Appointments: [place] - [phone] [place] - [phone]  Pulmonology  Appointments: [place] - [phone]  Rheumatology  Appointments: [place] - [phone]        [place] Location: [facility]  [address] [place], [place]  [address]  [place] Location: [facility] [address] [place], [place]  [address]  [place] Location: [facility] [address] [place], [place]  [address]    

## 2020-10-18 DIAGNOSIS — M1712 Unilateral primary osteoarthritis, left knee: Secondary | ICD-10-CM | POA: Diagnosis not present

## 2020-10-24 ENCOUNTER — Other Ambulatory Visit: Payer: Self-pay

## 2020-10-24 ENCOUNTER — Encounter
Admission: RE | Admit: 2020-10-24 | Discharge: 2020-10-24 | Disposition: A | Discharge status: Home / Self Care | Payer: PPO | Source: Ambulatory Visit | Attending: Orthopedic Surgery | Admitting: Orthopedic Surgery

## 2020-10-24 DIAGNOSIS — Z01810 Encounter for preprocedural cardiovascular examination: Secondary | ICD-10-CM | POA: Diagnosis not present

## 2020-10-24 DIAGNOSIS — I447 Left bundle-branch block, unspecified: Secondary | ICD-10-CM | POA: Diagnosis not present

## 2020-10-24 DIAGNOSIS — Z01818 Encounter for other preprocedural examination: Secondary | ICD-10-CM | POA: Insufficient documentation

## 2020-10-24 DIAGNOSIS — I440 Atrioventricular block, first degree: Secondary | ICD-10-CM | POA: Insufficient documentation

## 2020-10-24 HISTORY — DX: Prediabetes: R73.03

## 2020-10-24 HISTORY — DX: Unspecified osteoarthritis, unspecified site: M19.90

## 2020-10-24 LAB — TYPE AND SCREEN
ABO/RH(D): A POS
Antibody Screen: NEGATIVE

## 2020-10-24 LAB — COMPREHENSIVE METABOLIC PANEL
ALT: 23 U/L (ref 0–44)
AST: 21 U/L (ref 15–41)
Albumin: 4.2 g/dL (ref 3.5–5.0)
Alkaline Phosphatase: 64 U/L (ref 38–126)
Anion gap: 8 (ref 5–15)
BUN: 17 mg/dL (ref 8–23)
CO2: 28 mmol/L (ref 22–32)
Calcium: 9.2 mg/dL (ref 8.9–10.3)
Chloride: 101 mmol/L (ref 98–111)
Creatinine, Ser: 0.73 mg/dL (ref 0.61–1.24)
GFR, Estimated: >60 mL/min (ref >60)
Glucose, Bld: 105 mg/dL — ABNORMAL HIGH (ref 70–99)
Potassium: 3.6 mmol/L (ref 3.5–5.1)
Sodium: 137 mmol/L (ref 135–145)
Total Bilirubin: 1.1 mg/dL (ref 0.3–1.2)
Total Protein: 7.3 g/dL (ref 6.5–8.1)

## 2020-10-24 LAB — CBC
HCT: 43.1 % (ref 39.0–52.0)
Hemoglobin: 14.8 g/dL (ref 13.0–17.0)
MCH: 29.5 pg (ref 26.0–34.0)
MCHC: 34.3 g/dL (ref 30.0–36.0)
MCV: 86 fL (ref 80.0–100.0)
Platelets: 155 10*3/uL (ref 150–400)
RBC: 5.01 MIL/uL (ref 4.22–5.81)
RDW: 13.8 % (ref 11.5–15.5)
WBC: 6.2 10*3/uL (ref 4.0–10.5)
nRBC: 0 % (ref 0.0–0.2)

## 2020-10-24 LAB — URINALYSIS, ROUTINE W REFLEX MICROSCOPIC
Bilirubin Urine: NEGATIVE
Glucose, UA: NEGATIVE mg/dL
Hgb urine dipstick: NEGATIVE
Ketones, ur: NEGATIVE mg/dL
Leukocytes,Ua: NEGATIVE
Nitrite: NEGATIVE
Protein, ur: NEGATIVE mg/dL
Specific Gravity, Urine: 1.012 (ref 1.005–1.030)
pH: 7 (ref 5.0–8.0)

## 2020-10-24 LAB — SEDIMENTATION RATE: Sed Rate: 60 mm/h — ABNORMAL HIGH (ref 0–20)

## 2020-10-24 LAB — PROTIME-INR
INR: 1 (ref 0.8–1.2)
Prothrombin Time: 13.6 s (ref 11.4–15.2)

## 2020-10-24 LAB — SURGICAL PCR SCREEN
MRSA, PCR: NEGATIVE
Staphylococcus aureus: NEGATIVE

## 2020-10-24 LAB — C-REACTIVE PROTEIN: CRP: 0.6 mg/dL

## 2020-10-24 LAB — APTT: aPTT: 28 seconds (ref 24–36)

## 2020-10-24 NOTE — Patient Instructions (Addendum)
Your procedure is scheduled on: Monday November 04, 2020. Report to Day Surgery inside [facility] 2nd floor stop by admissions desk first before getting on elevator. To find out your arrival time please call [phone] between 1PM - 3PM on Friday November 01, 2020.  Remember: Instructions that are not followed completely may result in serious medical risk,  up to and including death, or upon the discretion of your surgeon and anesthesiologist your  surgery may need to be rescheduled.     _X__ 1. Do not eat food after midnight the night before your procedure.                 No chewing gum or hard candies. You may drink clear liquids up to 2 hours                 before you are scheduled to arrive for your surgery- DO not drink clear                 liquids within 2 hours of the start of your surgery.                 Clear Liquids include:  water, apple juice without pulp, clear Gatorade, G2 or                  Gatorade Zero (avoid Red/Purple/Blue), Black Coffee or Tea (Do not add                 anything to coffee or tea).  __X__2.   Complete the "Ensure Clear Pre-surgery Clear Carbohydrate Drink" provided to you, 2 hours before arrival. **If you are diabetic you will be provided with an alternative drink, Gatorade Zero or G2.  __X__3.  On the morning of surgery brush your teeth with toothpaste and water, you                may rinse your mouth with mouthwash if you wish.  Do not swallow any toothpaste of mouthwash.     _X__ 4.  No Alcohol for 24 hours before or after surgery.   _X__ 5.  Do Not Smoke or use e-cigarettes For 24 Hours Prior to Your Surgery.                 Do not use any chewable tobacco products for at least 6 hours prior to                 Surgery.  _X__  6.  Do not use any recreational drugs (marijuana, cocaine, heroin, ecstasy, MDMA or other)                For at least one week prior to your surgery.  Combination of these drugs with  anesthesia                May have life threatening results.  __X__  7.  Notify your doctor if there is any change in your medical condition      (cold, fever, infections).     Do not wear jewelry, make-up, hairpins, clips or nail polish. Do not wear lotions, powders, or perfumes. You may wear deodorant. Do not shave 48 hours prior to surgery. Men may shave face and neck. Do not bring valuables to the hospital.    [facility] is not responsible for any belongings or valuables.  Contacts, dentures or bridgework may not be worn into surgery. Leave your suitcase in the car. After  surgery it may be brought to your room. For patients admitted to the hospital, discharge time is determined by your treatment team.   Patients discharged the day of surgery will not be allowed to drive home.   Make arrangements for someone to be with you for the first 24 hours of your Same Day Discharge.    Please read over the following fact sheets that you were given:   Total Joint Packet    __X__ Take these medicines the morning of surgery with A SIP OF WATER:    1. nebivolol (BYSTOLIC) 5 MG   2. omeprazole (PRILOSEC) 20 MG   3. cetirizine (ZYRTEC) 10 MG  4.  5.  6.  ____ Fleet Enema (as directed)   __X__ Use CHG Soap (or wipes) as directed  ____ Use Benzoyl Peroxide Gel as instructed  ____ Use inhalers on the day of surgery  ____ Stop metformin 2 days prior to surgery    ____ Take 1/2 of usual insulin dose the night before surgery. No insulin the morning          of surgery.   __X__ Call your PCP, cardiologist, or Pulmonologist if taking Coumadin/Plavix/aspirin and ask when to stop before your surgery.   __X__ One Week prior to surgery- Stop Anti-inflammatories such as Ibuprofen, Aleve, Advil, Motrin, meloxicam (MOBIC), diclofenac, etodolac, ketorolac, Toradol, Daypro, piroxicam, Goody's or BC powders. OK TO USE TYLENOL IF NEEDED   __X__ One week prior to surgery stop ALL supplements  until after surgery.    ____ Bring C-Pap to the hospital.    If you have any questions regarding your pre-procedure instructions,  Please call Pre-admit Testing at [phone]

## 2020-10-24 NOTE — Patient Instructions (Signed)
Your procedure is scheduled on: Monday November 04, 2020. Report to Day [facility] 2nd floor stop by admissions desk first before getting on elevator. To find out your arrival time please call [phone] between 1PM - 3PM on  Friday November 01, 2020.  Remember: Instructions that are not followed completely may result in serious medical risk,  up to and including death, or upon the discretion of your surgeon and anesthesiologist your  surgery may need to be rescheduled.     _X__ 1. Do not eat food after midnight the night before your procedure.                 No chewing gum or hard candies. You may drink clear liquids up to 2 hours                 before you are scheduled to arrive for your surgery- DO not drink clear                 liquids within 2 hours of the start of your surgery.                 Clear Liquids include:  water, apple juice without pulp, clear Gatorade, G2 or                  Gatorade Zero (avoid Red/Purple/Blue), Black Coffee or Tea (Do not add                 anything to coffee or tea).  __X__2.   Complete the "Ensure Clear Pre-surgery Clear Carbohydrate Drink" provided to you, 2 hours before arrival. **If you are diabetic you will be provided with an alternative drink, Gatorade Zero or G2.  __X__3.  On the morning of surgery brush your teeth with toothpaste and water, you                may rinse your mouth with mouthwash if you wish.  Do not swallow any toothpaste of mouthwash.     _X__ 4.  No Alcohol for 24 hours before or after surgery.   _X__ 5.  Do Not Smoke or use e-cigarettes For 24 Hours Prior to Your Surgery.                 Do not use any chewable tobacco products for at least 6 hours prior to                 Surgery.  _X__  6.  Do not use any recreational drugs (marijuana, cocaine, heroin, ecstasy, MDMA or other)                For at least one week prior to your surgery.  Combination of these drugs with anesthesia                 May have life threatening results.  __X__  7.  Notify your doctor if there is any change in your medical condition      (cold, fever, infections).     Do not wear jewelry, make-up, hairpins, clips or nail polish. Do not wear lotions, powders, or perfumes. You may wear deodorant. Do not shave 48 hours prior to surgery. Men may shave face and neck. Do not bring valuables to the hospital.    [facility] is not responsible for any belongings or valuables.  Contacts, dentures or bridgework may not be worn into surgery. Leave your suitcase in the car. After  surgery it may be brought to your room. For patients admitted to the hospital, discharge time is determined by your treatment team.   Patients discharged the day of surgery will not be allowed to drive home.   Make arrangements for someone to be with you for the first 24 hours of your Same Day Discharge.    Please read over the following fact sheets that you were given:   Total Joint Packet    __X__ Take these medicines the morning of surgery with A SIP OF WATER:    1. cetirizine (ZYRTEC) 10 MG   2. omeprazole (PRILOSEC) 20 MG  3. nebivolol (BYSTOLIC) 5 MG  4.  5.  6.  ____ Fleet Enema (as directed)   __X__ Use CHG Soap (or wipes) as directed  ____ Use Benzoyl Peroxide Gel as instructed  ____ Use inhalers on the day of surgery  ____ Stop metformin 2 days prior to surgery    ____ Take 1/2 of usual insulin dose the night before surgery. No insulin the morning          of surgery.   __X__ One week prior to surgery stop aspirin 81 mg .   ___X_ One Week prior to surgery- Stop Anti-inflammatories such as Ibuprofen, Aleve, Advil, Motrin, meloxicam (MOBIC), diclofenac, etodolac, ketorolac, Toradol, Daypro, piroxicam, Goody's or BC powders. OK TO USE TYLENOL IF NEEDED   __X__ One week prior to surgery stop ALL supplements until after surgery. Except Vitamin D and Potassium.   __X__ Bring C-Pap to the hospital.     If you have any questions regarding your pre-procedure instructions,  Please call Pre-admit Testing at [phone]

## 2020-10-25 LAB — URINE CULTURE
Culture: NO GROWTH
Special Requests: NORMAL

## 2020-10-29 ENCOUNTER — Encounter (INDEPENDENT_AMBULATORY_CARE_PROVIDER_SITE_OTHER): Payer: Self-pay

## 2020-10-31 ENCOUNTER — Other Ambulatory Visit
Admission: RE | Admit: 2020-10-31 | Discharge: 2020-10-31 | Disposition: A | Discharge status: Home / Self Care | Payer: PPO | Source: Ambulatory Visit | Attending: Orthopedic Surgery | Admitting: Orthopedic Surgery

## 2020-10-31 ENCOUNTER — Other Ambulatory Visit: Payer: Self-pay

## 2020-10-31 DIAGNOSIS — Z01812 Encounter for preprocedural laboratory examination: Secondary | ICD-10-CM | POA: Diagnosis not present

## 2020-10-31 DIAGNOSIS — Z20822 Contact with and (suspected) exposure to covid-19: Secondary | ICD-10-CM | POA: Insufficient documentation

## 2020-10-31 LAB — SARS CORONAVIRUS 2 (TAT 6-24 HRS): SARS Coronavirus 2: NEGATIVE

## 2020-11-03 NOTE — H&P (Signed)
ORTHOPAEDIC HISTORY & PHYSICAL Andrew Myers, [place] - 10/18/2020 10:45 AM EDT Formatting of this note is different from the original. [place] [facility] Chief Complaint:   Chief Complaint  Patient presents with   Knee Pain  H & P LEFT KNEE   History of Present Illness:   Andrew Myers is a 75 y.o. male that presents to clinic today for his preoperative history and evaluation. Patient presents unaccompanied. The patient is scheduled to undergo a left total knee arthroplasty on 11/04/20 by [name]. His pain began several years ago. The pain is located primarily along the medial aspect of the knee. He describes his pain as worse with weightbearing. He reports associated swelling, some giving way of the knee. He denies associated numbness or tingling, denies locking.   The patient's symptoms have progressed to the point that they decrease his quality of life. The patient has previously undergone conservative treatment including NSAIDS and injections to the knee without adequate control of his symptoms.  Denies history of lumbar surgery, significant cardiac history, or history of blood clots.   Patient will be again staying with his friend after the surgery at [address], [place], [place] [address]. Patient reports allergy to penicillin but previously received cefazolin without issue.   Patient reports difficulty picking up lovenox injections after previous knee replacement.   Past Medical, Surgical, Family, Social History, Allergies, Medications:   Past Medical History:  Past Medical History:  Diagnosis Date   Allergic rhinitis   History of adenomatous polyp of colon   History of chickenpox   History of elevated PSA   History of wrist fracture 06/23/2009  Left scaphoid nondisplaced fracture with avulsion fracture.   Hyperlipidemia   Hypertension   Pneumonia 06/2019  Hospitalized with CT, thorocentesis right lung effusion    Sleep apnea  Uses CPAP   Past Surgical History:  Past Surgical History:  Procedure Laterality Date   Right total knee arthroplasty using computer-assisted navigation 05/06/2020  Dr Marry Guan   APPENDECTOMY   COLONOSCOPY 01/09/2020  Tubular adenoma/PHx CP/Repeat 50yr/CTL   FRACTURE SURGERY  Repair 5th metacarpal fracture 2003 ([place]   Current Medications:  Current Outpatient Medications  Medication Sig Dispense Refill   acetaminophen (TYLENOL) 500 MG tablet Take 500 mg by mouth every 8 (eight) hours as needed   ascorbic acid, vitamin C, (VITAMIN C) 1000 MG tablet Take 1,000 mg by mouth 2 (two) times daily   aspirin 81 MG EC tablet Take 81 mg by mouth 2 (two) times daily.   atorvastatin (LIPITOR) 20 MG tablet TAKE 1 TABLET BY MOUTH EVERYDAY AT BEDTIME 90 tablet 3   bisacodyL (DULCOLAX) 5 mg EC tablet Take 10 mg by mouth once daily as needed 1-3 TABLETS A DAY   carbamide peroxide (DEBROX) 6.5 % otic solution Place 2 drops into both ears once daily as needed   carica papaya (PAPAYA ENZYME) Tab Take 4 tablets by mouth once daily as needed   cetirizine (ZYRTEC) 10 MG tablet Take 10 mg by mouth once daily   cholecalciferol (VITAMIN D3) 1000 unit tablet Take 2,000 Units by mouth once daily   diphenhydrAMINE (BENADRYL) 25 mg capsule Take 25 mg by mouth every 6 (six) hours as needed   docusate (COLACE) 100 MG capsule Take 100 mg by mouth once daily as needed   fluticasone propionate (FLONASE) 50 mcg/actuation nasal spray Place 1 spray into both nostrils once daily as needed   hydroCHLOROthiazide (HYDRODIURIL) 50  MG tablet Take 0.5 tablets (25 mg total) by mouth once daily 15 tablet 11   Lactobacillus acidophilus (PROBIOTIC ORAL) Take 1 capsule by mouth once daily With prebiotic   loperamide (IMODIUM) 2 mg capsule Take 2 mg by mouth as needed   magnesium salicylate (PERCOGESIC BACKACHE RELIEF) 580 (467) mg Tab Take 2 tablets by mouth every 6 (six) hours as needed   melatonin 10 mg  Tab Take 10 mg by mouth nightly as needed   miconazole 2 % powder Apply topically as needed   multivitamin tablet Take 1 tablet by mouth once daily.   nebivoloL (BYSTOLIC) 5 MG tablet TAKE 1 TABLET BY MOUTH EVERY DAY 90 tablet 3   neomycin-bacitracnZn-polymyxnB 3.5-500-10,000 mg-unit-unit Oint Apply 1 Application topically once daily as needed   omega-3 fatty acids-fish oil 360-1,200 mg Cap Take 1 capsule by mouth 2 (two) times daily   omeprazole (PRILOSEC) 20 MG DR capsule Take 20 mg by mouth once daily as needed   polyethylene glycol (MIRALAX) powder Take 17 g by mouth once daily as needed   propylene glycoL (SYSTANE BALANCE) 0.6 % ophthalmic drops Place 1 drop into both eyes nightly   pseudoephedrine (SUDAFED) 30 mg tablet Take 30 mg by mouth every 4 (four) hours as needed   sildenafil, antihypertensive, (REVATIO) 20 mg tablet Take 20-100 mg by mouth once daily as needed   simethicone (MYLICON) 161 MG chewable tablet Take 125 mg by mouth once daily as needed   sodium chloride (AYR) 0.65 % nasal drops Place 1 drop into one nostril as needed   tamsulosin (FLOMAX) 0.4 mg capsule Take 0.4 mg by mouth 2 (two) times daily Take 30 minutes after same meal each day.   turmeric-herbal complex no.278 150 mg Cap Take 1 capsule by mouth 3 (three) times daily   valsartan (DIOVAN) 160 MG tablet TAKE 1 TABLET (160 MG TOTAL) BY MOUTH ONCE DAILY 90 tablet 3   vit A and D3 in cod liver oiL 1,250-135 unit Cap Take 1,250 Units by mouth once daily   allantoin-onion-pegs-water [facility]) Gel Apply topically once daily   arginine HCl, L-arginine, 1,000 mg Tab Take 1 tablet by mouth 2 (two) times daily   cal phos/bellad alk/chamomile (HOMEOPATHIC, CA-BELLADONNA-CM, ORAL) Take 1 tablet by mouth every 4 (four) hours as needed   calcium carbonate (TUMS E-X) 300 mg (750 mg) chewable tablet Take by mouth   clindamycin (CLEOCIN) 300 MG capsule TAKE 2 CAPSULES (600 MG TOTAL) BY MOUTH ONCE FOR 1 DOSE ONE HOUR BEFORE DENTAL  APPT.   miscellaneous medical supply Misc Take 8,000 mg by mouth 2 (two) times daily   neomycin-bacitracnZn-polymyxnB 3.5-500-10,000 mg-unit-unit Oint Apply topically   phenyleph-pramoxin-glycr-w.pet 0.25-1 % Crea Apply topically   PHOSPHATIDYLSERINE-EPA-OMEGA 3 ORAL Take 400 mg by mouth 2 (two) times daily   potassium gluconate 2.5 mEq Tab Take by mouth   No current facility-administered medications for this visit.   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  Tobacco Use   Smoking  status: Former Smoker  Years: 6.00  Types: Pipe  Quit date: 1976  Years since quitting: 46.6   Smokeless tobacco: Never Used  Scientific laboratory technician Use: Never used  Substance and Sexual Activity   Alcohol use: Yes  Alcohol/week: 0.0 standard drinks  Comment: mixed drink once a month   Drug use: Never   Sexual activity: Yes  Partners: Female   Family History:  Family History  Problem Relation Age of Onset   Breast cancer Mother   Arthritis Mother   Myocardial Infarction (Heart attack) Father   High blood pressure (Hypertension) Father   Thyroid cancer Father   Heart failure Sister   Heart disease Brother   Alcohol abuse Brother   Gout Niece   Review of Systems:   A 10+ ROS was performed, reviewed, and the pertinent orthopaedic findings are documented in the HPI.   Physical Examination:   BP (!) 140/90 (BP Location: Left upper arm, Patient Position: Sitting, BP Cuff Size: Adult)   Ht 177.8 cm (_0 )  Wt 97.9 kg (215 lb 12.8 oz)  BMI 30.96 kg/m   Patient is a well-developed, well-nourished male in no acute distress. Patient has normal mood and affect. Patient is alert and oriented to person, place, and time.   HEENT: Atraumatic, normocephalic. Pupils equal and reactive to light. Extraocular motion intact. Noninjected sclera.  Cardiovascular: Regular rate and rhythm, with no murmurs, rubs, or gallops. No bruits. Distal pulses auscultated with Doppler.  Respiratory: Lungs clear to auscultation bilaterally.   Left Knee:  Soft tissue swelling: minimal Effusion: none Erythema: none Crepitance: mild Tenderness: medial joint line Alignment: relative varus Mediolateral laxity: medial pseudolaxity Anterior drawer test:negative Lachman`s test: negative McMurray`s test: negative Atrophy: No significant atrophy.  Quadriceps tone was good. Range of Motion: 0/5/132 degrees   Sensation intact over the saphenous, lateral sural cutaneous, superficial fibular, and deep fibular nerve distributions.  Tests Performed/Reviewed:  X-rays  3 views of the left knee were obtained. Images reveal moderate loss of medial compartment joint space with osteophyte formation. No fractures or dislocations. No other osseous abnormality noted.  I personally ordered and interpreted today's x-rays.  Impression:   ICD-10-CM  1. Primary osteoarthritis of left knee M17.12   Plan:   The patient has end-stage degenerative changes of the left knee. It was explained to the patient that the condition is progressive in nature. Having failed conservative treatment, the patient has elected to proceed with a total joint arthroplasty. The patient will undergo a total joint arthroplasty with [name]. The risks of surgery, including blood clot and infection, were discussed with the patient. Measures to reduce these risks, including the use of anticoagulation, perioperative antibiotics, and early  ambulation were discussed. The importance of postoperative physical therapy was discussed with the patient. The patient elects to proceed with surgery. The patient is instructed to stop all blood thinners prior to surgery. The patient is instructed to call the hospital the day before surgery to learn of the proper arrival time. Prescription sent to pharmacy for Lovenox with note that it is to be picked up following patient's total knee arthroplasty.  Contact our office with any questions or concerns. Follow up as indicated, or sooner should any new problems arise, if conditions worsen, or if they are otherwise concerned.   Andrew Myers, [facility] [place], [place] [address] Phone: [phone]  This note was generated in part with voice recognition software  and I apologize for any typographical errors that were not detected and corrected.  Electronically signed by Andrew Fudge, PA at 10/18/2020 11:40 AM EDT

## 2020-11-03 NOTE — Anesthesia Preprocedure Evaluation (Addendum)
Anesthesia Evaluation  Patient identified by MRN, date of birth, ID band Patient awake    Reviewed: Allergy & Precautions, H&P , NPO status , Patient's Chart, lab work & pertinent test results, reviewed documented beta blocker date and time   Airway Mallampati: IV  TM Distance: <3 FB Neck ROM: full  Mouth opening: Limited Mouth Opening  Dental  (+) Teeth Intact   Pulmonary sleep apnea and Continuous Positive Airway Pressure Ventilation , former smoker,    Pulmonary exam normal        Cardiovascular Exercise Tolerance: Poor METS: 3 - Mets hypertension, On Medications (-) angina(-) CAD and (-) Past MI Normal cardiovascular exam+ dysrhythmias (EKG 9/22: Sinus bradycardia with 1st degree A-V block) (-) Valvular Problems/Murmurs Rhythm:regular Rate:Normal     Neuro/Psych negative neurological ROS  negative psych ROS   GI/Hepatic Neg liver ROS, hiatal hernia, GERD  Medicated,  Endo/Other  negative endocrine ROS  Renal/GU negative Renal ROS  negative genitourinary   Musculoskeletal   Abdominal (+) + obese,   Peds  Hematology negative hematology ROS (+)   Anesthesia Other Findings Past Medical History: No date: Allergic rhinitis No date: BPH (benign prostatic hyperplasia) 04/2019: CAP (community acquired pneumonia) No date: Empyema ([place]) 06/2019: Empyema lung ([place]) No date: Family history of adverse reaction to anesthesia     Comment:  Sister with nausea and vomiting No date: GERD (gastroesophageal reflux disease) No date: Hiatal hernia No date: History of adenomatous polyp of colon No date: History of chickenpox No date: History of kidney stones No date: History of wrist fracture No date: Hyperlipidemia No date: Hypertension No date: Obesity 05/2019: Pleural effusion on right No date: Skin cancer     Comment:  BCC, SCC No date: Sleep apnea     Comment:  Uses Bi-Pap No date: Sleep apnea Past Surgical  History: No date: APPENDECTOMY No date: CATARACT EXTRACTION; Bilateral 01/09/2020: COLONOSCOPY; N/A     Comment:  Procedure: COLONOSCOPY;  Surgeon: Lesly Rubenstein,               MD;  Location: [facility] ENDOSCOPY;  Service: Endoscopy;                Laterality: N/A; No date: EMPYEMA DRAINAGE 04/16/2020: FINE NEEDLE ASPIRATION BIOPSY     Comment:  ISTHMUS THYROID AND MIDDLE LEFT THYROID -BOTH BENIGN 2003: FRACTURE SURGERY     Comment:  repair 5th metacarpal fracture  No date: LUMBAR PUNCTURE 2016: PROSTATE BIOPSY   Reproductive/Obstetrics negative OB ROS                            Anesthesia Physical  Anesthesia Plan  ASA: III  Anesthesia Plan: Spinal   Post-op Pain Management:    Induction:   PONV Risk Score and Plan: 2 and Propofol infusion and TIVA  Airway Management Planned: Nasal Cannula and Natural Airway  Additional Equipment:   Intra-op Plan:   Post-operative Plan:   Informed Consent: I have reviewed the patients History and Physical, chart, labs and discussed the procedure including the risks, benefits and alternatives for the proposed anesthesia with the patient or authorized representative who has indicated his/her understanding and acceptance.     Dental advisory given  Plan Discussed with: CRNA and Anesthesiologist  Anesthesia Plan Comments:        Anesthesia Quick Evaluation

## 2020-11-04 ENCOUNTER — Inpatient Hospital Stay
Admission: RE | Admit: 2020-11-04 | Discharge: 2020-11-05 | DRG: 470 | Disposition: A | Discharge status: Home Health | Payer: PPO | Attending: Orthopedic Surgery | Admitting: Orthopedic Surgery

## 2020-11-04 ENCOUNTER — Inpatient Hospital Stay: Payer: PPO

## 2020-11-04 ENCOUNTER — Encounter: Payer: Self-pay | Admitting: Orthopedic Surgery

## 2020-11-04 ENCOUNTER — Inpatient Hospital Stay: Payer: PPO | Admitting: Anesthesiology

## 2020-11-04 ENCOUNTER — Encounter
Admission: RE | Disposition: A | Discharge status: Home Health | Payer: Self-pay | Source: Home / Self Care | Attending: Orthopedic Surgery

## 2020-11-04 ENCOUNTER — Inpatient Hospital Stay: Payer: PPO | Admitting: Urgent Care

## 2020-11-04 DIAGNOSIS — Z6832 Body mass index (BMI) 32.0-32.9, adult: Secondary | ICD-10-CM | POA: Diagnosis not present

## 2020-11-04 DIAGNOSIS — I10 Essential (primary) hypertension: Secondary | ICD-10-CM | POA: Diagnosis present

## 2020-11-04 DIAGNOSIS — K219 Gastro-esophageal reflux disease without esophagitis: Secondary | ICD-10-CM | POA: Diagnosis present

## 2020-11-04 DIAGNOSIS — G4730 Sleep apnea, unspecified: Secondary | ICD-10-CM | POA: Diagnosis not present

## 2020-11-04 DIAGNOSIS — Z881 Allergy status to other antibiotic agents status: Secondary | ICD-10-CM | POA: Diagnosis not present

## 2020-11-04 DIAGNOSIS — Z8249 Family history of ischemic heart disease and other diseases of the circulatory system: Secondary | ICD-10-CM | POA: Diagnosis not present

## 2020-11-04 DIAGNOSIS — N400 Enlarged prostate without lower urinary tract symptoms: Secondary | ICD-10-CM | POA: Diagnosis present

## 2020-11-04 DIAGNOSIS — Z8781 Personal history of (healed) traumatic fracture: Secondary | ICD-10-CM

## 2020-11-04 DIAGNOSIS — R7303 Prediabetes: Secondary | ICD-10-CM | POA: Diagnosis not present

## 2020-11-04 DIAGNOSIS — Z808 Family history of malignant neoplasm of other organs or systems: Secondary | ICD-10-CM | POA: Diagnosis not present

## 2020-11-04 DIAGNOSIS — M25562 Pain in left knee: Secondary | ICD-10-CM | POA: Diagnosis present

## 2020-11-04 DIAGNOSIS — Z880 Allergy status to penicillin: Secondary | ICD-10-CM

## 2020-11-04 DIAGNOSIS — Z803 Family history of malignant neoplasm of breast: Secondary | ICD-10-CM | POA: Diagnosis not present

## 2020-11-04 DIAGNOSIS — Z85828 Personal history of other malignant neoplasm of skin: Secondary | ICD-10-CM

## 2020-11-04 DIAGNOSIS — Z96652 Presence of left artificial knee joint: Secondary | ICD-10-CM | POA: Diagnosis not present

## 2020-11-04 DIAGNOSIS — Z96659 Presence of unspecified artificial knee joint: Secondary | ICD-10-CM

## 2020-11-04 DIAGNOSIS — Z8619 Personal history of other infectious and parasitic diseases: Secondary | ICD-10-CM

## 2020-11-04 DIAGNOSIS — M1712 Unilateral primary osteoarthritis, left knee: Principal | ICD-10-CM | POA: Diagnosis present

## 2020-11-04 DIAGNOSIS — Z96651 Presence of right artificial knee joint: Secondary | ICD-10-CM | POA: Diagnosis present

## 2020-11-04 DIAGNOSIS — Z811 Family history of alcohol abuse and dependence: Secondary | ICD-10-CM

## 2020-11-04 DIAGNOSIS — Z885 Allergy status to narcotic agent status: Secondary | ICD-10-CM

## 2020-11-04 DIAGNOSIS — E785 Hyperlipidemia, unspecified: Secondary | ICD-10-CM | POA: Diagnosis not present

## 2020-11-04 DIAGNOSIS — E669 Obesity, unspecified: Secondary | ICD-10-CM | POA: Diagnosis not present

## 2020-11-04 DIAGNOSIS — Z8261 Family history of arthritis: Secondary | ICD-10-CM

## 2020-11-04 DIAGNOSIS — Z87891 Personal history of nicotine dependence: Secondary | ICD-10-CM

## 2020-11-04 HISTORY — PX: KNEE ARTHROPLASTY: SHX992

## 2020-11-04 SURGERY — ARTHROPLASTY, KNEE, TOTAL, USING IMAGELESS COMPUTER-ASSISTED NAVIGATION
Anesthesia: Spinal | Site: Knee | Laterality: Left

## 2020-11-04 MED ORDER — TRANEXAMIC ACID-NACL 1000-0.7 MG/100ML-% IV SOLN
1000.0000 mg | INTRAVENOUS | Status: AC
  Administered 2020-11-04: 1000 mg via INTRAVENOUS

## 2020-11-04 MED ORDER — CALCIUM CARBONATE ANTACID 500 MG PO CHEW: 2.0000 | CHEWABLE_TABLET | Freq: Every day | ORAL | Status: DC | PRN

## 2020-11-04 MED ORDER — FLEET ENEMA 7-19 GM/118ML RE ENEM: 1.0000 | ENEMA | Freq: Once | RECTAL | Status: DC | PRN

## 2020-11-04 MED ORDER — FENTANYL CITRATE (PF) 100 MCG/2ML IJ SOLN: 25.0000 ug | INTRAMUSCULAR | Status: DC | PRN

## 2020-11-04 MED ORDER — TRANEXAMIC ACID-NACL 1000-0.7 MG/100ML-% IV SOLN
INTRAVENOUS | Status: AC
  Administered 2020-11-04: 1000 mg via INTRAVENOUS
  Filled 2020-11-04: qty 100

## 2020-11-04 MED ORDER — TRAMADOL HCL 50 MG PO TABS: 50.0000 mg | ORAL_TABLET | ORAL | Status: DC | PRN

## 2020-11-04 MED ORDER — PROMETHAZINE HCL 25 MG/ML IJ SOLN: 6.2500 mg | INTRAMUSCULAR | Status: DC | PRN

## 2020-11-04 MED ORDER — OXYCODONE HCL 5 MG PO TABS: 5.0000 mg | ORAL_TABLET | Freq: Once | ORAL | Status: DC | PRN

## 2020-11-04 MED ORDER — CEFAZOLIN SODIUM-DEXTROSE 2-4 GM/100ML-% IV SOLN
INTRAVENOUS | Status: AC
  Filled 2020-11-04: qty 100

## 2020-11-04 MED ORDER — HYDROMORPHONE HCL 1 MG/ML IJ SOLN
INTRAMUSCULAR | Status: DC | PRN
  Administered 2020-11-04 (×3): .2 mg via INTRAVENOUS

## 2020-11-04 MED ORDER — TAMSULOSIN HCL 0.4 MG PO CAPS
0.8000 mg | ORAL_CAPSULE | Freq: Every day | ORAL | Status: DC
  Administered 2020-11-04: 0.8 mg via ORAL
  Filled 2020-11-04: qty 2

## 2020-11-04 MED ORDER — METOCLOPRAMIDE HCL 10 MG PO TABS
10.0000 mg | ORAL_TABLET | Freq: Three times a day (TID) | ORAL | Status: DC
  Administered 2020-11-04 – 2020-11-05 (×4): 10 mg via ORAL
  Filled 2020-11-04 (×4): qty 1

## 2020-11-04 MED ORDER — ACETAMINOPHEN 10 MG/ML IV SOLN
INTRAVENOUS | Status: DC | PRN
  Administered 2020-11-04: 1000 mg via INTRAVENOUS

## 2020-11-04 MED ORDER — CEFAZOLIN SODIUM-DEXTROSE 2-4 GM/100ML-% IV SOLN
2.0000 g | INTRAVENOUS | Status: AC
  Administered 2020-11-04: 2 g via INTRAVENOUS

## 2020-11-04 MED ORDER — OXYCODONE HCL 5 MG/5ML PO SOLN: 5.0000 mg | Freq: Once | ORAL | Status: DC | PRN

## 2020-11-04 MED ORDER — DIPHENHYDRAMINE HCL 25 MG PO CAPS: 25.0000 mg | ORAL_CAPSULE | Freq: Four times a day (QID) | ORAL | Status: DC | PRN

## 2020-11-04 MED ORDER — ORAL CARE MOUTH RINSE: 15.0000 mL | Freq: Once | OROMUCOSAL | Status: AC

## 2020-11-04 MED ORDER — ONDANSETRON HCL 4 MG/2ML IJ SOLN: 4.0000 mg | Freq: Four times a day (QID) | INTRAMUSCULAR | Status: DC | PRN

## 2020-11-04 MED ORDER — SENNOSIDES-DOCUSATE SODIUM 8.6-50 MG PO TABS
1.0000 | ORAL_TABLET | Freq: Two times a day (BID) | ORAL | Status: DC
  Administered 2020-11-04 – 2020-11-05 (×2): 1 via ORAL
  Filled 2020-11-04 (×2): qty 1

## 2020-11-04 MED ORDER — ENOXAPARIN SODIUM 30 MG/0.3ML IJ SOSY
30.0000 mg | PREFILLED_SYRINGE | Freq: Two times a day (BID) | INTRAMUSCULAR | Status: DC
  Administered 2020-11-05: 30 mg via SUBCUTANEOUS
  Filled 2020-11-04: qty 0.3

## 2020-11-04 MED ORDER — OMEGA-3-ACID ETHYL ESTERS 1 G PO CAPS
1.0000 g | ORAL_CAPSULE | Freq: Two times a day (BID) | ORAL | Status: DC
  Administered 2020-11-04 – 2020-11-05 (×2): 1 g via ORAL
  Filled 2020-11-04 (×2): qty 1

## 2020-11-04 MED ORDER — ACETAMINOPHEN 10 MG/ML IV SOLN
1000.0000 mg | Freq: Four times a day (QID) | INTRAVENOUS | Status: AC
  Administered 2020-11-04 – 2020-11-05 (×4): 1000 mg via INTRAVENOUS
  Filled 2020-11-04 (×4): qty 100

## 2020-11-04 MED ORDER — CELECOXIB 200 MG PO CAPS
200.0000 mg | ORAL_CAPSULE | Freq: Two times a day (BID) | ORAL | Status: DC
  Administered 2020-11-05: 200 mg via ORAL
  Filled 2020-11-04 (×2): qty 1

## 2020-11-04 MED ORDER — SURGIPHOR WOUND IRRIGATION SYSTEM - OPTIME
TOPICAL | Status: DC | PRN
  Administered 2020-11-04: 1 via TOPICAL

## 2020-11-04 MED ORDER — BUPIVACAINE HCL (PF) 0.25 % IJ SOLN
INTRAMUSCULAR | Status: DC | PRN
  Administered 2020-11-04: 60 mL

## 2020-11-04 MED ORDER — SODIUM CHLORIDE 0.9 % IV SOLN: INTRAVENOUS | Status: DC

## 2020-11-04 MED ORDER — FLUTICASONE PROPIONATE 50 MCG/ACT NA SUSP
1.0000 | Freq: Every day | NASAL | Status: DC | PRN
  Filled 2020-11-04: qty 16

## 2020-11-04 MED ORDER — 0.9 % SODIUM CHLORIDE (POUR BTL) OPTIME
TOPICAL | Status: DC | PRN
  Administered 2020-11-04: 500 mL

## 2020-11-04 MED ORDER — LOPERAMIDE HCL 2 MG PO CAPS
2.0000 mg | ORAL_CAPSULE | Freq: Four times a day (QID) | ORAL | Status: DC | PRN
Start: 2020-11-04 — End: 2020-11-05

## 2020-11-04 MED ORDER — GABAPENTIN 300 MG PO CAPS
ORAL_CAPSULE | ORAL | Status: AC
  Administered 2020-11-04: 300 mg via ORAL
  Filled 2020-11-04: qty 1

## 2020-11-04 MED ORDER — HYDROCHLOROTHIAZIDE 25 MG PO TABS
25.0000 mg | ORAL_TABLET | Freq: Every morning | ORAL | Status: DC
  Administered 2020-11-05: 25 mg via ORAL
  Filled 2020-11-04: qty 1

## 2020-11-04 MED ORDER — CELECOXIB 200 MG PO CAPS: 400.0000 mg | ORAL_CAPSULE | Freq: Once | ORAL | Status: AC

## 2020-11-04 MED ORDER — PHENOL 1.4 % MT LIQD
1.0000 | OROMUCOSAL | Status: DC | PRN
  Filled 2020-11-04: qty 177

## 2020-11-04 MED ORDER — ENSURE PRE-SURGERY PO LIQD
296.0000 mL | Freq: Once | ORAL | Status: DC
  Filled 2020-11-04: qty 296

## 2020-11-04 MED ORDER — ACETAMINOPHEN 10 MG/ML IV SOLN
INTRAVENOUS | Status: AC
  Filled 2020-11-04: qty 100

## 2020-11-04 MED ORDER — DEXAMETHASONE SODIUM PHOSPHATE 10 MG/ML IJ SOLN: 8.0000 mg | Freq: Once | INTRAMUSCULAR | Status: AC

## 2020-11-04 MED ORDER — PANTOPRAZOLE SODIUM 40 MG PO TBEC
40.0000 mg | DELAYED_RELEASE_TABLET | Freq: Two times a day (BID) | ORAL | Status: DC
  Administered 2020-11-04 – 2020-11-05 (×3): 40 mg via ORAL
  Filled 2020-11-04 (×3): qty 1

## 2020-11-04 MED ORDER — POTASSIUM 99 MG PO TABS: 99.0000 mg | ORAL_TABLET | Freq: Every morning | ORAL | Status: DC

## 2020-11-04 MED ORDER — BISACODYL 10 MG RE SUPP: 10.0000 mg | Freq: Every day | RECTAL | Status: DC | PRN

## 2020-11-04 MED ORDER — PROPOFOL 1000 MG/100ML IV EMUL
INTRAVENOUS | Status: AC
  Filled 2020-11-04: qty 100

## 2020-11-04 MED ORDER — PROBIOTIC-PREBIOTIC 1-250 BILLION-MG PO CAPS: 1.0000 | ORAL_CAPSULE | Freq: Every morning | ORAL | Status: DC

## 2020-11-04 MED ORDER — MENTHOL 3 MG MT LOZG
1.0000 | LOZENGE | OROMUCOSAL | Status: DC | PRN
  Filled 2020-11-04: qty 9

## 2020-11-04 MED ORDER — TRANEXAMIC ACID-NACL 1000-0.7 MG/100ML-% IV SOLN
INTRAVENOUS | Status: AC
  Filled 2020-11-04: qty 100

## 2020-11-04 MED ORDER — FERROUS SULFATE 325 (65 FE) MG PO TABS
325.0000 mg | ORAL_TABLET | Freq: Two times a day (BID) | ORAL | Status: DC
  Administered 2020-11-04 – 2020-11-05 (×2): 325 mg via ORAL
  Filled 2020-11-04 (×2): qty 1

## 2020-11-04 MED ORDER — CHLORHEXIDINE GLUCONATE 0.12 % MT SOLN
OROMUCOSAL | Status: AC
  Administered 2020-11-04: 15 mL via OROMUCOSAL
  Filled 2020-11-04: qty 15

## 2020-11-04 MED ORDER — FENTANYL CITRATE (PF) 100 MCG/2ML IJ SOLN
INTRAMUSCULAR | Status: AC
  Filled 2020-11-04: qty 2

## 2020-11-04 MED ORDER — LACTATED RINGERS IV SOLN: INTRAVENOUS | Status: DC

## 2020-11-04 MED ORDER — GLYCOPYRROLATE 0.2 MG/ML IJ SOLN
INTRAMUSCULAR | Status: DC | PRN
  Administered 2020-11-04: .2 mg via INTRAVENOUS

## 2020-11-04 MED ORDER — SODIUM CHLORIDE 0.9 % IR SOLN
Status: DC | PRN
  Administered 2020-11-04: 3000 mL

## 2020-11-04 MED ORDER — FENTANYL CITRATE (PF) 100 MCG/2ML IJ SOLN
INTRAMUSCULAR | Status: DC | PRN
  Administered 2020-11-04: 50 ug via INTRAVENOUS
  Administered 2020-11-04 (×2): 25 ug via INTRAVENOUS

## 2020-11-04 MED ORDER — ATORVASTATIN CALCIUM 20 MG PO TABS
20.0000 mg | ORAL_TABLET | Freq: Every day | ORAL | Status: DC
  Administered 2020-11-04: 20 mg via ORAL
  Filled 2020-11-04: qty 1

## 2020-11-04 MED ORDER — ONDANSETRON HCL 4 MG PO TABS: 4.0000 mg | ORAL_TABLET | Freq: Four times a day (QID) | ORAL | Status: DC | PRN

## 2020-11-04 MED ORDER — SALINE SPRAY 0.65 % NA SOLN
1.0000 | NASAL | Status: DC | PRN
  Filled 2020-11-04: qty 44

## 2020-11-04 MED ORDER — HYDROMORPHONE HCL 1 MG/ML IJ SOLN
INTRAMUSCULAR | Status: AC
  Filled 2020-11-04: qty 1

## 2020-11-04 MED ORDER — CHLORHEXIDINE GLUCONATE 4 % EX LIQD: 60.0000 mL | Freq: Once | CUTANEOUS | Status: DC

## 2020-11-04 MED ORDER — PROPOFOL 500 MG/50ML IV EMUL
INTRAVENOUS | Status: DC | PRN
  Administered 2020-11-04: 125 ug/kg/min via INTRAVENOUS

## 2020-11-04 MED ORDER — ONDANSETRON HCL 4 MG/2ML IJ SOLN
INTRAMUSCULAR | Status: DC | PRN
  Administered 2020-11-04: 4 mg via INTRAVENOUS

## 2020-11-04 MED ORDER — PHENYLEPHRINE HCL (PRESSORS) 10 MG/ML IV SOLN
INTRAVENOUS | Status: DC | PRN
Start: 2020-11-04 — End: 2020-11-04
  Administered 2020-11-04 (×2): 50 ug via INTRAVENOUS

## 2020-11-04 MED ORDER — ACETAMINOPHEN 325 MG PO TABS: 325.0000 mg | ORAL_TABLET | Freq: Four times a day (QID) | ORAL | Status: DC | PRN

## 2020-11-04 MED ORDER — MICONAZOLE NITRATE 2 % EX POWD
1.0000 "application " | Freq: Two times a day (BID) | CUTANEOUS | Status: DC | PRN
  Filled 2020-11-04: qty 1

## 2020-11-04 MED ORDER — OXYCODONE HCL 5 MG PO TABS
10.0000 mg | ORAL_TABLET | ORAL | Status: DC | PRN
  Administered 2020-11-04 – 2020-11-05 (×2): 10 mg via ORAL
  Filled 2020-11-04 (×2): qty 2

## 2020-11-04 MED ORDER — CELECOXIB 200 MG PO CAPS
ORAL_CAPSULE | ORAL | Status: AC
  Administered 2020-11-04: 400 mg via ORAL
  Filled 2020-11-04: qty 2

## 2020-11-04 MED ORDER — TRANEXAMIC ACID-NACL 1000-0.7 MG/100ML-% IV SOLN: 1000.0000 mg | Freq: Once | INTRAVENOUS | Status: AC

## 2020-11-04 MED ORDER — BUPIVACAINE HCL (PF) 0.5 % IJ SOLN
INTRAMUSCULAR | Status: DC | PRN
  Administered 2020-11-04: 3 mL

## 2020-11-04 MED ORDER — VITAMIN D3 25 MCG (1000 UNIT) PO TABS
2000.0000 [IU] | ORAL_TABLET | Freq: Every morning | ORAL | Status: DC
  Administered 2020-11-05: 2000 [IU] via ORAL
  Filled 2020-11-04 (×3): qty 2

## 2020-11-04 MED ORDER — RISAQUAD PO CAPS
2.0000 | ORAL_CAPSULE | Freq: Every day | ORAL | Status: DC
  Administered 2020-11-05: 2 via ORAL
  Filled 2020-11-04: qty 2

## 2020-11-04 MED ORDER — MAGNESIUM HYDROXIDE 400 MG/5ML PO SUSP
30.0000 mL | Freq: Every day | ORAL | Status: DC
  Administered 2020-11-04 – 2020-11-05 (×2): 30 mL via ORAL
  Filled 2020-11-04 (×2): qty 30

## 2020-11-04 MED ORDER — LORATADINE 10 MG PO TABS
10.0000 mg | ORAL_TABLET | Freq: Every day | ORAL | Status: DC
  Administered 2020-11-05: 10 mg via ORAL
  Filled 2020-11-04: qty 1

## 2020-11-04 MED ORDER — POLYVINYL ALCOHOL 1.4 % OP SOLN
1.0000 [drp] | OPHTHALMIC | Status: DC | PRN
  Filled 2020-11-04: qty 15

## 2020-11-04 MED ORDER — PHENYLEPHRINE HCL (PRESSORS) 10 MG/ML IV SOLN
INTRAVENOUS | Status: AC
  Filled 2020-11-04: qty 1

## 2020-11-04 MED ORDER — DEXAMETHASONE SODIUM PHOSPHATE 10 MG/ML IJ SOLN
INTRAMUSCULAR | Status: AC
  Administered 2020-11-04: 8 mg via INTRAVENOUS
  Filled 2020-11-04: qty 1

## 2020-11-04 MED ORDER — ACETAMINOPHEN 10 MG/ML IV SOLN: 1000.0000 mg | Freq: Once | INTRAVENOUS | Status: DC | PRN

## 2020-11-04 MED ORDER — NEBIVOLOL HCL 5 MG PO TABS
5.0000 mg | ORAL_TABLET | Freq: Every morning | ORAL | Status: DC
  Administered 2020-11-05: 5 mg via ORAL
  Filled 2020-11-04 (×2): qty 1

## 2020-11-04 MED ORDER — HYDROMORPHONE HCL 1 MG/ML IJ SOLN: 0.5000 mg | INTRAMUSCULAR | Status: DC | PRN

## 2020-11-04 MED ORDER — ALUM & MAG HYDROXIDE-SIMETH 200-200-20 MG/5ML PO SUSP: 30.0000 mL | ORAL | Status: DC | PRN

## 2020-11-04 MED ORDER — OXYCODONE HCL 5 MG PO TABS: 5.0000 mg | ORAL_TABLET | ORAL | Status: DC | PRN

## 2020-11-04 MED ORDER — SODIUM CHLORIDE 0.9 % IV SOLN
INTRAVENOUS | Status: DC | PRN
  Administered 2020-11-04: 60 mL

## 2020-11-04 MED ORDER — POLYETHYL GLYCOL-PROPYL GLYCOL 0.4-0.3 % OP GEL: Freq: Four times a day (QID) | OPHTHALMIC | Status: DC | PRN

## 2020-11-04 MED ORDER — CHLORHEXIDINE GLUCONATE 0.12 % MT SOLN: 15.0000 mL | Freq: Once | OROMUCOSAL | Status: AC

## 2020-11-04 MED ORDER — IRBESARTAN 150 MG PO TABS
150.0000 mg | ORAL_TABLET | Freq: Every day | ORAL | Status: DC
  Administered 2020-11-05: 150 mg via ORAL
  Filled 2020-11-04: qty 1

## 2020-11-04 MED ORDER — ADULT MULTIVITAMIN W/MINERALS CH
1.0000 | ORAL_TABLET | Freq: Every morning | ORAL | Status: DC
  Administered 2020-11-05: 1 via ORAL
  Filled 2020-11-04: qty 1

## 2020-11-04 MED ORDER — GABAPENTIN 300 MG PO CAPS: 300.0000 mg | ORAL_CAPSULE | Freq: Once | ORAL | Status: AC

## 2020-11-04 MED ORDER — SODIUM CHLORIDE 0.9 % IV SOLN
INTRAVENOUS | Status: DC | PRN
  Administered 2020-11-04: 50 ug/min via INTRAVENOUS

## 2020-11-04 MED ORDER — ASCORBIC ACID 500 MG PO TABS
1000.0000 mg | ORAL_TABLET | Freq: Two times a day (BID) | ORAL | Status: DC
  Administered 2020-11-04 – 2020-11-05 (×2): 1000 mg via ORAL
  Filled 2020-11-04 (×2): qty 2

## 2020-11-04 MED ORDER — CEFAZOLIN SODIUM-DEXTROSE 2-4 GM/100ML-% IV SOLN
2.0000 g | Freq: Four times a day (QID) | INTRAVENOUS | Status: AC
  Administered 2020-11-04 (×2): 2 g via INTRAVENOUS
  Filled 2020-11-04 (×2): qty 100

## 2020-11-04 MED ORDER — CALCIUM CARBONATE ANTACID 750 MG PO CHEW: 1.0000 | CHEWABLE_TABLET | Freq: Every day | ORAL | Status: DC | PRN

## 2020-11-04 MED ORDER — NEOMYCIN-POLYMYXIN B GU 40-200000 IR SOLN
Status: DC | PRN
  Administered 2020-11-04: 14 mL

## 2020-11-04 MED ORDER — PROPOFOL 10 MG/ML IV BOLUS
INTRAVENOUS | Status: AC
  Filled 2020-11-04: qty 20

## 2020-11-04 SURGICAL SUPPLY — 80 items
ATTUNE MED DOME PAT 41 KNEE (Knees) ×1 IMPLANT
ATTUNE PS FEM LT SZ 6 CEM KNEE (Femur) ×1 IMPLANT
ATTUNE PSRP INSR SZ6 5 KNEE (Insert) ×1 IMPLANT
BASE TIBIAL ROT PLAT SZ 7 KNEE (Knees) IMPLANT
BATTERY INSTRU NAVIGATION (MISCELLANEOUS) ×8 IMPLANT
BLADE SAW 70X12.5 (BLADE) ×2 IMPLANT
BLADE SAW 90X13X1.19 OSCILLAT (BLADE) ×2 IMPLANT
BLADE SAW 90X25X1.19 OSCILLAT (BLADE) ×2 IMPLANT
BNDG CMPR 75X41 PLY HI ABS (GAUZE/BANDAGES/DRESSINGS) ×1
BNDG STRETCH 4X75 STRL LF (GAUZE/BANDAGES/DRESSINGS) ×1 IMPLANT
BSPLAT TIB 7 CMNT ROT PLAT STR (Knees) ×1 IMPLANT
BTRY SRG DRVR LF (MISCELLANEOUS) ×4
CEMENT BONE GENTAMICIN (Cement) IMPLANT
CEMENT HV SMART SET (Cement) ×2 IMPLANT
COOLER POLAR GLACIER W/PUMP (MISCELLANEOUS) ×2 IMPLANT
CUFF TOURN SGL QUICK 34 (TOURNIQUET CUFF) ×2
CUFF TRNQT CYL 34X4.125X (TOURNIQUET CUFF) IMPLANT
DRAPE 3/4 80X56 (DRAPES) ×2 IMPLANT
DRAPE INCISE IOBAN 66X45 STRL (DRAPES) ×1 IMPLANT
DRSG DERMACEA 8X12 NADH (GAUZE/BANDAGES/DRESSINGS) ×2 IMPLANT
DRSG MEPILEX SACRM 8.7X9.8 (GAUZE/BANDAGES/DRESSINGS) ×2 IMPLANT
DRSG OPSITE POSTOP 4X14 (GAUZE/BANDAGES/DRESSINGS) ×2 IMPLANT
DRSG TEGADERM 4X4.75 (GAUZE/BANDAGES/DRESSINGS) ×2 IMPLANT
DURAPREP 26ML APPLICATOR (WOUND CARE) ×4 IMPLANT
ELECT CAUTERY BLADE 6.4 (BLADE) ×2 IMPLANT
ELECT REM PT RETURN 9FT ADLT (ELECTROSURGICAL) ×2
ELECTRODE REM PT RTRN 9FT ADLT (ELECTROSURGICAL) ×1 IMPLANT
EX-PIN ORTHOLOCK NAV 4X150 (PIN) ×4 IMPLANT
GAUZE 4X4 16PLY ~~LOC~~+RFID DBL (SPONGE) ×2 IMPLANT
GLOVE SURG ENC MOIS LTX SZ7.5 (GLOVE) ×4 IMPLANT
GLOVE SURG ENC TEXT LTX SZ7.5 (GLOVE) ×4 IMPLANT
GLOVE SURG UNDER LTX SZ8 (GLOVE) ×2 IMPLANT
GLOVE SURG UNDER POLY LF SZ7.5 (GLOVE) ×2 IMPLANT
GOWN STRL REUS W/ TWL LRG LVL3 (GOWN DISPOSABLE) ×2 IMPLANT
GOWN STRL REUS W/ TWL XL LVL3 (GOWN DISPOSABLE) ×1 IMPLANT
GOWN STRL REUS W/TWL LRG LVL3 (GOWN DISPOSABLE) ×4
GOWN STRL REUS W/TWL XL LVL3 (GOWN DISPOSABLE) ×2
HEMOVAC 400CC 10FR (MISCELLANEOUS) ×2 IMPLANT
HOLDER FOLEY CATH W/STRAP (MISCELLANEOUS) ×2 IMPLANT
IRRIGATION SURGIPHOR STRL (IV SOLUTION) ×2 IMPLANT
IV NS IRRIG 3000ML ARTHROMATIC (IV SOLUTION) ×2 IMPLANT
KIT TURNOVER KIT A (KITS) ×2 IMPLANT
KNIFE SCULPS 14X20 (INSTRUMENTS) ×2 IMPLANT
LABEL OR SOLS (LABEL) ×2 IMPLANT
MANIFOLD NEPTUNE II (INSTRUMENTS) ×4 IMPLANT
NDL SAFETY ECLIPSE 18X1.5 (NEEDLE) ×1 IMPLANT
NDL SPNL 20GX3.5 QUINCKE YW (NEEDLE) ×2 IMPLANT
NEEDLE HYPO 18GX1.5 SHARP (NEEDLE) ×2
NEEDLE SPNL 20GX3.5 QUINCKE YW (NEEDLE) ×4 IMPLANT
NS IRRIG 500ML POUR BTL (IV SOLUTION) ×2 IMPLANT
PACK TOTAL KNEE (MISCELLANEOUS) ×2 IMPLANT
PAD ABD DERMACEA PRESS 5X9 (GAUZE/BANDAGES/DRESSINGS) ×1 IMPLANT
PAD WRAPON POLAR KNEE (MISCELLANEOUS) ×1 IMPLANT
PENCIL SMOKE EVACUATOR COATED (MISCELLANEOUS) ×2 IMPLANT
PIN DRILL FIX HALF THREAD (BIT) ×4 IMPLANT
PIN DRILL QUICK PACK ×5 IMPLANT
PIN FIXATION 1/8DIA X 3INL (PIN) ×2 IMPLANT
PULSAVAC PLUS IRRIG FAN TIP (DISPOSABLE) ×2
SOL PREP PVP 2OZ (MISCELLANEOUS) ×2
SOLUTION PREP PVP 2OZ (MISCELLANEOUS) ×1 IMPLANT
SPONGE DRAIN TRACH 4X4 STRL 2S (GAUZE/BANDAGES/DRESSINGS) ×2 IMPLANT
SPONGE T-LAP 18X18 ~~LOC~~+RFID (SPONGE) ×6 IMPLANT
STAPLER SKIN PROX 35W (STAPLE) ×2 IMPLANT
STOCKINETTE BIAS CUT 6 980064 (GAUZE/BANDAGES/DRESSINGS) ×1 IMPLANT
STOCKINETTE IMPERV 14X48 (MISCELLANEOUS) ×1 IMPLANT
STRAP TIBIA SHORT (MISCELLANEOUS) ×2 IMPLANT
SUCTION FRAZIER HANDLE 10FR (MISCELLANEOUS) ×4
SUCTION TUBE FRAZIER 10FR DISP (MISCELLANEOUS) ×1 IMPLANT
SUT VIC AB 0 CT1 36 (SUTURE) ×4 IMPLANT
SUT VIC AB 1 CT1 36 (SUTURE) ×4 IMPLANT
SUT VIC AB 2-0 CT2 27 (SUTURE) ×2 IMPLANT
SYR 20ML LL LF (SYRINGE) ×2 IMPLANT
SYR 30ML LL (SYRINGE) ×4 IMPLANT
TIBIAL BASE ROT PLAT SZ 7 KNEE (Knees) ×2 IMPLANT
TIP FAN IRRIG PULSAVAC PLUS (DISPOSABLE) ×1 IMPLANT
TOWEL OR 17X26 4PK STRL BLUE (TOWEL DISPOSABLE) ×2 IMPLANT
TOWER CARTRIDGE SMART MIX (DISPOSABLE) ×2 IMPLANT
TRAY FOLEY MTR SLVR 16FR STAT (SET/KITS/TRAYS/PACK) ×2 IMPLANT
WATER STERILE IRR 500ML POUR (IV SOLUTION) ×2 IMPLANT
WRAPON POLAR PAD KNEE (MISCELLANEOUS) ×2

## 2020-11-04 NOTE — H&P (Signed)
The patient has been re-examined, and the chart reviewed, and there have been no interval changes to the documented history and physical.    The risks, benefits, and alternatives have been discussed at length. The patient expressed understanding of the risks benefits and agreed with plans for surgical intervention.  Casmere Hollenbeck P. Seraphim Affinito, Jr. M.D.    

## 2020-11-04 NOTE — Anesthesia Procedure Notes (Signed)
Spinal  Patient location during procedure: OR Start time: 11/04/2020 7:18 AM End time: 11/04/2020 7:18 AM Reason for block: surgical anesthesia Staffing Performed: resident/CRNA  Resident/CRNA: Willette Alma, CRNA Preanesthetic Checklist Completed: patient identified, IV checked, site marked, risks and benefits discussed, surgical consent, monitors and equipment checked, pre-op evaluation and timeout performed Spinal Block Patient position: sitting Prep: Betadine and DuraPrep Patient monitoring: heart rate, cardiac monitor, continuous pulse ox and blood pressure Approach: midline Location: L3-4 Injection technique: single-shot Needle Needle type: Sprotte  Needle gauge: 24 G Needle length: 9 cm Assessment Sensory level: T4 Events: CSF return Additional Notes Negative paresthesia. Negative blood return. Positive free-flowing CSF. Expiration date of kit checked and confirmed. Patient tolerated procedure well, without complications.

## 2020-11-04 NOTE — Transfer of Care (Signed)
Immediate Anesthesia Transfer of Care Note  Patient: Andrew Myers  Procedure(s) Performed: COMPUTER ASSISTED TOTAL KNEE ARTHROPLASTY (Left: Knee)  Patient Location: PACU  Anesthesia Type:Spinal  Level of Consciousness: awake, alert  and oriented  Airway & Oxygen Therapy: Patient Spontanous Breathing and Patient connected to face mask oxygen  Post-op Assessment: Report given to RN and Post -op Vital signs reviewed and stable  Post vital signs: Reviewed and stable  Last Vitals:  Vitals Value Taken Time  BP 124/67 11/04/20 1056  Temp    Pulse 52 11/04/20 1058  Resp 13 11/04/20 1058  SpO2 97 % 11/04/20 1058  Vitals shown include unvalidated device data.  Last Pain:  Vitals:   11/04/20 0626  TempSrc: Oral  PainSc: 0-No pain      Patients Stated Pain Goal: 0 ([id number])  Complications: No notable events documented.

## 2020-11-04 NOTE — Evaluation (Signed)
Physical Therapy Evaluation Patient Details Name: Andrew Myers MRN: [id number] DOB: 10-04-1945 Today's Date: 11/04/2020  History of Present Illness  Pt is a 75 yo male s/p L TKA, WBAT. PMH of sleep apnea, HTN, DM, hiatal hernia, previous R TKA in March 2022.  Clinical Impression  Pt admitted with above diagnosis. Pt received supine in bed agreeable to PT services. Pt has significant other present in room. Pt able to report home lay out, DME, PLOF with no issues. Pt is fully indep at baseline with all ADL's/IADL's. Pain in L knee recorded at 0.5/10 NPS. Able to sit at Eob with [facility] elevated without use of bed rails denying dizziness/lightheadedness. Pt able to STS to RW  and amb with supervision 20' in room returning to bed with good understanding/use of RW and step through pattern with slight antalgic gait. Safe hand placement noted throughout as well. Supervision to return to supine in bed. Pt educated on LE therex he can begin to perform prior to next PT session. Based off of current level of mobility POD#0, anticipate pt safe to D/c home with [facility] PT. Pt currently with functional limitations due to the deficits listed below (see PT Problem List). Pt will benefit from skilled PT to increase their independence and safety with mobility to allow discharge to the venue listed below.     Recommendations for follow up therapy are one component of a multi-disciplinary discharge planning process, led by the attending physician.  Recommendations may be updated based on patient status, additional functional criteria and insurance authorization.  Follow Up Recommendations Home health PT    Equipment Recommendations  None recommended by PT    Recommendations for Other Services       Precautions / Restrictions Precautions Precautions: Knee Precaution Booklet Issued: No Restrictions Weight Bearing Restrictions: Yes LLE Weight Bearing: Weight bearing as tolerated      Mobility  Bed  Mobility Overal bed mobility: Needs Assistance Bed Mobility: Supine to Sit;Sit to Supine     Supine to sit: Supervision Sit to supine: Supervision     Patient Response: Cooperative  Transfers Overall transfer level: Needs assistance Equipment used: Rolling walker (2 wheeled) Transfers: Sit to/from Stand Sit to Stand: Supervision            Ambulation/Gait Ambulation/Gait assistance: Supervision Gait Distance (Feet): 20 Feet Assistive device: Rolling walker (2 wheeled) Gait Pattern/deviations: Decreased stance time - left;Step-through pattern        Stairs            Wheelchair Mobility    Modified Rankin (Stroke Patients Only)       Balance Overall balance assessment: Needs assistance Sitting-balance support: No upper extremity supported;Feet supported Sitting balance-Leahy Scale: Normal       Standing balance-Leahy Scale: Good Standing balance comment: Able to stand without UE support on RW.                             Pertinent Vitals/Pain Pain Assessment: 0-10 Pain Score: 1  Pain Location: L knee Pain Descriptors / Indicators: Aching Pain Intervention(s): Limited activity within patient's tolerance;Monitored during session;Repositioned    Home Living Family/patient expects to be discharged to:: Private residence Living Arrangements: Spouse/significant other Available Help at Discharge: Family;Available 24 hours/day Type of Home: House Home Access: Stairs to enter Entrance Stairs-Rails: Right Entrance Stairs-Number of Steps: 2 STE with railing on R. 5 STE front of house. Home Layout: One level;Full bath on main  level Home Equipment: Shower seat - built in;Cane - quad;Cane - single point;Crutches;Grab bars - toilet;Toilet riser;Grab bars - tub/shower;Tub bench;Walker - 2 wheels;Bedside commode Additional Comments: Has variety of equipment to assist in improving knee ROM    Prior Function Level of Independence: Independent                Hand Dominance   Dominant Hand: Right    Extremity/Trunk Assessment   Upper Extremity Assessment Upper Extremity Assessment: Overall WFL for tasks assessed    Lower Extremity Assessment Lower Extremity Assessment: LLE deficits/detail LLE Deficits / Details: L TKA LLE Sensation: WNL    Cervical / Trunk Assessment Cervical / Trunk Assessment: Normal  Communication   Communication: No difficulties  Cognition Arousal/Alertness: Awake/alert Behavior During Therapy: WFL for tasks assessed/performed Overall Cognitive Status: Within Functional Limits for tasks assessed                                        General Comments General comments (skin integrity, edema, etc.): Displays ability to side step with RW.    Exercises Other Exercises Other Exercises: Role of PT in acute setting, D/c recs, LE there.ex   Assessment/Plan    PT Assessment Patient needs continued PT services  PT Problem List Decreased strength;Decreased range of motion;Decreased activity tolerance;Decreased mobility;Pain       PT Treatment Interventions DME instruction;Balance training;Gait training;Neuromuscular re-education;Stair training;Functional mobility training;Patient/family education;Therapeutic activities;Therapeutic exercise    PT Goals (Current goals can be found in the Care Plan section)  Acute Rehab PT Goals Patient Stated Goal: go home PT Goal Formulation: With patient Time For Goal Achievement: 11/18/20 Potential to Achieve Goals: Good    Frequency BID   Barriers to discharge        Co-evaluation               AM-PAC PT "6 Clicks" Mobility  Outcome Measure Help needed turning from your back to your side while in a flat bed without using bedrails?: None Help needed moving from lying on your back to sitting on the side of a flat bed without using bedrails?: None Help needed moving to and from a bed to a chair (including a wheelchair)?: A  Little Help needed standing up from a chair using your arms (e.g., wheelchair or bedside chair)?: A Little Help needed to walk in hospital room?: A Little Help needed climbing 3-5 steps with a railing? : A Little 6 Click Score: 20    End of Session Equipment Utilized During Treatment: Gait belt Activity Tolerance: Patient tolerated treatment well Patient left: in bed;with call bell/phone within reach;with family/visitor present;with SCD's reapplied Nurse Communication: Mobility status PT Visit Diagnosis: Muscle weakness (generalized) (M62.81)    Time: [id number] PT Time Calculation (min) (ACUTE ONLY): 21 min   Charges:   PT Evaluation $PT Eval Low Complexity: 1 Low PT Treatments $Gait Training: 8-22 mins       Salem Caster. Fairly IV, PT, DPT Physical Therapist- [facility]  11/04/2020, 3:04 PM

## 2020-11-04 NOTE — Op Note (Signed)
OPERATIVE NOTE  DATE OF SURGERY:  11/04/2020  PATIENT NAME:  Telly Coreas   DOB: 19-Jan-1946  MRN: [id number]  PRE-OPERATIVE DIAGNOSIS: Degenerative arthrosis of the left knee, primary  POST-OPERATIVE DIAGNOSIS:  Same  PROCEDURE:  Left total knee arthroplasty using computer-assisted navigation  SURGEON:  Marciano Sequin. M.D.  ASSISTANT: Cassell Smiles, PA-C (present and scrubbed throughout the case, critical for assistance with exposure, retraction, instrumentation, and closure)  ANESTHESIA: spinal  ESTIMATED BLOOD LOSS: 50 mL  FLUIDS REPLACED: 1500 mL of crystalloid  TOURNIQUET TIME: 90 minutes  DRAINS: 2 medium Hemovac drains  SOFT TISSUE RELEASES: Anterior cruciate ligament, posterior cruciate ligament, deep  medial collateral ligament, patellofemoral ligament  IMPLANTS UTILIZED: DePuy Attune size 6 posterior stabilized femoral component (cemented), size 7 rotating platform tibial component (cemented), 41 mm medialized dome patella (cemented), and a 5 mm stabilized rotating platform polyethylene insert.  INDICATIONS FOR SURGERY: Trevino Shakur is a 75 y.o. year old male with a long history of progressive knee pain. X-rays demonstrated severe degenerative changes in tricompartmental fashion. The patient had not seen any significant improvement despite conservative nonsurgical intervention. After discussion of the risks and benefits of surgical intervention, the patient expressed understanding of the risks benefits and agree with plans for total knee arthroplasty.   The risks, benefits, and alternatives were discussed at length including but not limited to the risks of infection, bleeding, nerve injury, stiffness, blood clots, the need for revision surgery, cardiopulmonary complications, among others, and they were willing to proceed.  PROCEDURE IN DETAIL: The patient was brought into the operating room and, after adequate spinal anesthesia was achieved, a tourniquet  was placed on the patient's upper thigh. The patient's knee and leg were cleaned and prepped with alcohol and DuraPrep and draped in the usual sterile fashion. A "timeout" was performed as per usual protocol. The lower extremity was exsanguinated using an Esmarch, and the tourniquet was inflated to 300 mmHg. An anterior longitudinal incision was made followed by a standard mid vastus approach. The deep fibers of the medial collateral ligament were elevated in a subperiosteal fashion off of the medial flare of the tibia so as to maintain a continuous soft tissue sleeve. The patella was subluxed laterally and the patellofemoral ligament was incised. Inspection of the knee demonstrated severe degenerative changes with full-thickness loss of articular cartilage. Osteophytes were debrided using a rongeur. Anterior and posterior cruciate ligaments were excised. Two 4.0 mm Schanz pins were inserted in the femur and into the tibia for attachment of the array of trackers used for computer-assisted navigation. Hip center was identified using a circumduction technique. Distal landmarks were mapped using the computer. The distal femur and proximal tibia were mapped using the computer. The distal femoral cutting guide was positioned using computer-assisted navigation so as to achieve a 5 distal valgus cut. The femur was sized and it was felt that a size 6 femoral component was appropriate. A size 6 femoral cutting guide was positioned and the anterior cut was performed and verified using the computer. This was followed by completion of the posterior and chamfer cuts. Femoral cutting guide for the central box was then positioned in the center box cut was performed.  Attention was then directed to the proximal tibia. Medial and lateral menisci were excised. The extramedullary tibial cutting guide was positioned using computer-assisted navigation so as to achieve a 0 varus-valgus alignment and 3 posterior slope. The cut was  performed and verified using the computer. The proximal tibia  was sized and it was felt that a size 7 tibial tray was appropriate. Tibial and femoral trials were inserted followed by insertion of a 5 mm polyethylene insert. This allowed for excellent mediolateral soft tissue balancing both in flexion and in full extension. Finally, the patella was cut and prepared so as to accommodate a 41 mm medialized dome patella. A patella trial was placed and the knee was placed through a range of motion with excellent patellar tracking appreciated. The femoral trial was removed after debridement of posterior osteophytes. The central post-hole for the tibial component was reamed followed by insertion of a keel punch. Tibial trials were then removed. Cut surfaces of bone were irrigated with copious amounts of normal saline using pulsatile lavage and then suctioned dry. Polymethylmethacrylate cement was prepared in the usual fashion using a vacuum mixer. Cement was applied to the cut surface of the proximal tibia as well as along the undersurface of a size 7 rotating platform tibial component. Tibial component was positioned and impacted into place. Excess cement was removed using [employer] fast streamer. Cement was then applied to the cut surfaces of the femur as well as along the posterior flanges of the size 6 femoral component. The femoral component was positioned and impacted into place. Excess cement was removed using [employer] fast streamer. A 5 mm polyethylene trial was inserted and the knee was brought into full extension with steady axial compression applied. Finally, cement was applied to the backside of a 41 mm medialized dome patella and the patellar component was positioned and patellar clamp applied. Excess cement was removed using [employer] fast streamer. After adequate curing of the cement, the tourniquet was deflated after a total tourniquet time of 90 minutes. Hemostasis was achieved using electrocautery. The knee was irrigated with  copious amounts of normal saline using pulsatile lavage followed by 500 ml of Surgiphor and then suctioned dry. 20 mL of 1.3% Exparel and 60 mL of 0.25% Marcaine in 40 mL of normal saline was injected along the posterior capsule, medial and lateral gutters, and along the arthrotomy site. A 5 mm stabilized rotating platform polyethylene insert was inserted and the knee was placed through a range of motion with excellent mediolateral soft tissue balancing appreciated and excellent patellar tracking noted. 2 medium drains were placed in the wound bed and brought out through separate stab incisions. The medial parapatellar portion of the incision was reapproximated using interrupted sutures of #1 Vicryl. Subcutaneous tissue was approximated in layers using first #0 Vicryl followed #2-0 Vicryl. The skin was approximated with skin staples. A sterile dressing was applied.  The patient tolerated the procedure well and was transported to the recovery room in stable condition.    Tanay Misuraca P. Holley Bouche., M.D.

## 2020-11-05 ENCOUNTER — Other Ambulatory Visit: Payer: Self-pay

## 2020-11-05 MED ORDER — CELECOXIB 200 MG PO CAPS: 200.0000 mg | ORAL_CAPSULE | Freq: Two times a day (BID) | ORAL | 0 refills | Status: DC

## 2020-11-05 MED ORDER — OXYCODONE HCL 5 MG PO TABS: 5.0000 mg | ORAL_TABLET | ORAL | 0 refills | Status: DC | PRN

## 2020-11-05 MED ORDER — TRAMADOL HCL 50 MG PO TABS: 50.0000 mg | ORAL_TABLET | ORAL | 0 refills | Status: DC | PRN

## 2020-11-05 NOTE — Progress Notes (Signed)
Pt discharged to home.  IV removed without complication.  AVS given to pt and explained with no further questions.  All belongings at bedside taken with pt.  Pt transported off unit via WC  

## 2020-11-05 NOTE — Progress Notes (Signed)
Post-op dressing removed. , Hemovac removed., and Mini compression dressing applied.  No significant drainage noted on honeycomb.

## 2020-11-05 NOTE — Discharge Summary (Signed)
Physician Discharge Summary  Patient ID: Andrew Myers MRN: [id number] DOB/AGE: May 22, 1945 75 y.o.  Admit date: 11/04/2020 Discharge date: 11/05/2020  Admission Diagnoses:  Total knee replacement status [Z96.659]  Surgeries:Procedure(s):  Left total knee arthroplasty using computer-assisted navigation   SURGEON:  Marciano Sequin. M.D.   ASSISTANT: Cassell Smiles, PA-C (present and scrubbed throughout the case, critical for assistance with exposure, retraction, instrumentation, and closure)   ANESTHESIA: spinal   ESTIMATED BLOOD LOSS: 50 mL   FLUIDS REPLACED: 1500 mL of crystalloid   TOURNIQUET TIME: 90 minutes   DRAINS: 2 medium Hemovac drains   SOFT TISSUE RELEASES: Anterior cruciate ligament, posterior cruciate ligament, deep  medial collateral ligament, patellofemoral ligament   IMPLANTS UTILIZED: DePuy Attune size 6 posterior stabilized femoral component (cemented), size 7 rotating platform tibial component (cemented), 41 mm medialized dome patella (cemented), and a 5 mm stabilized rotating platform polyethylene insert.  Discharge Diagnoses: Patient Active Problem List   Diagnosis Date Noted   Total knee replacement status 11/04/2020   Status post total right knee replacement 05/06/2020   Primary osteoarthritis of left knee 12/17/2019   Primary osteoarthritis of right knee 12/17/2019   Neck pain 07/18/2019   Empyema ([place]) 07/18/2019   Joint pain 07/18/2019   Hypokalemia 07/05/2019   Hyperglycemia 07/05/2019   Empyema of right pleural space ([place]) 07/05/2019   Hypertension    Hyperlipidemia    BPH (benign prostatic hyperplasia)    Obesity    Sleep apnea    Benign prostatic hyperplasia (BPH) with urinary urgency 05/02/2019   Elevated PSA 10/27/2018   Type 2 diabetes mellitus without complication, without long-term current use of insulin ([place]) 10/27/2018   Benign essential hypertension 09/11/2013   GERD (gastroesophageal reflux disease) 09/11/2013    Past  Medical History:  Diagnosis Date   Allergic rhinitis    Arthritis    BPH (benign prostatic hyperplasia)    CAP (community acquired pneumonia) 04/2019   Empyema ([place])    Empyema lung ([place]) 06/2019   Family history of adverse reaction to anesthesia    Sister with nausea and vomiting   GERD (gastroesophageal reflux disease)    Hiatal hernia    History of adenomatous polyp of colon    History of chickenpox    History of kidney stones    History of wrist fracture    Hyperlipidemia    Hypertension    Obesity    Pleural effusion on right 05/2019   Pre-diabetes    Skin cancer    BCC, SCC   Sleep apnea    Uses Bi-Pap   Sleep apnea      Transfusion:    Consultants (if any):   Discharged Condition: Improved  Hospital Course: Andrew Myers is an 75 y.o. male who was admitted 11/04/2020 with a diagnosis of left knee osteoarthritis and went to the operating room on 11/04/2020 and underwent left total knee arthoplasty. The patient received perioperative antibiotics for prophylaxis (see below). The patient tolerated the procedure well and was transported to PACU in stable condition. After meeting PACU criteria, the patient was subsequently transferred to the Orthopaedics/Rehabilitation unit.   The patient received DVT prophylaxis in the form of early mobilization, Lovenox, Foot Pumps, and TED hose. A sacral pad had been placed and heels were elevated off of the bed with rolled towels in order to protect skin integrity. Foley catheter was discontinued on postoperative day #0. Wound drains were discontinued on postoperative day #1. The surgical incision was healing well  without signs of infection.  Physical therapy was initiated postoperatively for transfers, gait training, and strengthening. Occupational therapy was initiated for activities of daily living and evaluation for assisted devices. Rehabilitation goals were reviewed in detail with the patient. The patient made steady progress  with physical therapy and physical therapy recommended discharge to Home.   The patient achieved the preliminary goals of this hospitalization and was felt to be medically and orthopaedically appropriate for discharge.  He was given perioperative antibiotics:  Anti-infectives (From admission, onward)    Start     Dose/Rate Route Frequency Ordered Stop   11/04/20 1400  ceFAZolin (ANCEF) IVPB 2g/100 mL premix        2 g 200 mL/hr over 30 Minutes Intravenous Every 6 hours 11/04/20 1204 11/05/20 0643   11/04/20 0609  ceFAZolin (ANCEF) 2-4 GM/100ML-% IVPB       Note to Pharmacy: [place] [facility], [facility]: cabinet override      11/04/20 0609 11/04/20 0729   11/04/20 0600  ceFAZolin (ANCEF) IVPB 2g/100 mL premix        2 g 200 mL/hr over 30 Minutes Intravenous On call to O.R. 11/04/20 0040 11/04/20 0724     .  Recent vital signs:  Vitals:   11/05/20 0346 11/05/20 0833  BP: (!) 157/68 (!) 160/83  Pulse: (!) 56 72  Resp: 17 20  Temp: 98 F (36.7 C) 97.8 F (36.6 C)  SpO2: 99% 98%    Recent laboratory studies:  No results for input(s): WBC, HGB, HCT, PLT, K, CL, CO2, BUN, CREATININE, GLUCOSE, CALCIUM, LABPT, INR in the last 72 hours.  Diagnostic Studies: DG Knee Left Port  Result Date: 11/04/2020 CLINICAL DATA:  Postop left knee replacement. EXAM: PORTABLE LEFT KNEE - 1-2 VIEW COMPARISON:  None. FINDINGS: Status post total knee arthroplasty. No perihardware lucency or evidence of hardware fracture. The prosthesis appears in anatomic alignment. No acute fracture or dislocation. Postoperative changes, including drain and surgical staples in the overlying soft tissues. IMPRESSION: Stents post left total knee arthroplasty, with expected postoperative appearance. Electronically Signed   By: Merilyn Baba M.D.   On: 11/04/2020 11:53    Discharge Medications:   Allergies as of 11/05/2020       Reactions   Penicillins Other (See Comments)   FEBRILE SERUM SICKNESS WITH POLYARTICULAR  ARTHRALGIAS IgE = 41 (WNL) 02/20/2020   Augmentin [amoxicillin-pot Clavulanate]    FEBRILE SERUM SICKNESS WITH POLYARTICULAR ARTHRALGIAS IgE = 41 (WNL) 02/20/2020   Codeine Rash   Wound Dressing Adhesive Rash        Medication List     STOP taking these medications    aspirin EC 81 MG tablet       TAKE these medications    acetaminophen 500 MG tablet Commonly known as: TYLENOL Take 500 mg by mouth every 6 (six) hours as needed (pain.).   atorvastatin 20 MG tablet Commonly known as: LIPITOR Take 20 mg by mouth at bedtime.   bisacodyl 5 MG EC tablet Commonly known as: DULCOLAX Take 5-15 mg by mouth daily as needed for moderate constipation.   calcium carbonate 750 MG chewable tablet Commonly known as: TUMS EX Chew 1 tablet by mouth daily as needed for heartburn.   celecoxib 200 MG capsule Commonly known as: CELEBREX Take 1 capsule (200 mg total) by mouth 2 (two) times daily.   cetirizine 10 MG tablet Commonly known as: ZYRTEC Take 10 mg by mouth in the morning.   Cod Liver Oil 1250-130 units Caps  Take 1 capsule by mouth in the morning.   diphenhydrAMINE 25 mg capsule Commonly known as: BENADRYL Take 25 mg by mouth every 6 (six) hours as needed for itching.   docusate sodium 100 MG capsule Commonly known as: COLACE Take 100-300 mg by mouth daily as needed for mild constipation.   Fish Oil 1200 MG Caps Take 1,200 mg by mouth in the morning and at bedtime.   fluticasone 50 MCG/ACT nasal spray Commonly known as: FLONASE Place 1 spray into both nostrils daily as needed for allergies.   hydrochlorothiazide 50 MG tablet Commonly known as: HYDRODIURIL Take 25 mg by mouth in the morning.   L-ARGININE PO Take 1 capsule by mouth in the morning and at bedtime. With  L-citrulline   loperamide 2 MG capsule Commonly known as: IMODIUM Take 2-4 mg by mouth 4 (four) times daily as needed for diarrhea or loose stools.   Mederma Gel Apply 1 application topically  daily.   melatonin 5 MG Tabs Take 5 mg by mouth at bedtime as needed (sleep).   miconazole 2 % powder Commonly known as: MICOTIN Apply 1 application topically 2 (two) times daily as needed for itching (jock itch).   multivitamin with minerals Tabs tablet Take 1 tablet by mouth in the morning.   nebivolol 5 MG tablet Commonly known as: BYSTOLIC Take 5 mg by mouth in the morning.   neomycin-bacitracin-polymyxin [id number] ointment Apply 1 application topically 4 (four) times daily as needed (cuts/scrapes/wound care).   omeprazole 20 MG capsule Commonly known as: PRILOSEC Take 20 mg by mouth in the morning.   OVER THE COUNTER MEDICATION Take 1,200 mg by mouth in the morning and at bedtime. BEET ROOT CAPSULES   OVER THE COUNTER MEDICATION RELEASE  DIETARY SUPPLEMENT   oxyCODONE 5 MG immediate release tablet Commonly known as: Oxy IR/ROXICODONE Take 1 tablet (5 mg total) by mouth every 4 (four) hours as needed for severe pain.   PAPAYA AND ENZYMES PO Take 4 capsules by mouth daily as needed (digestive health).   Percogesic Backache Relief 580 MG Tabs Generic drug: Magnesium Salicylate Tetrahyd Take 2 tablets by mouth every 6 (six) hours as needed (back pain).   PHOSPHATIDYLSERINE PO Take 400 mg by mouth in the morning and at bedtime.   polyethylene glycol powder 17 GM/SCOOP powder Commonly known as: GLYCOLAX/MIRALAX Take 17 g by mouth daily as needed for mild constipation.   Potassium 99 MG Tabs Take 99 mg by mouth in the morning.   Preparation H 1-0.25-14.4-15 % Crea Generic drug: Pramox-PE-Glycerin-Petrolatum Apply 1 application topically as needed (rectal discomfort).   PROBIOTIC-PREBIOTIC PO Take 1 capsule by mouth in the morning.   pseudoephedrine 30 MG tablet Commonly known as: SUDAFED Take 60 mg by mouth every 4 (four) hours as needed for congestion.   sildenafil 20 MG tablet Commonly known as: REVATIO Take 20-100 mg by mouth daily as needed (ED).    Simethicone 125 MG Caps Take 125-250 mg by mouth 3 (three) times daily as needed (gas relief).   sodium chloride 0.65 % Soln nasal spray Commonly known as: OCEAN Place 1 spray into both nostrils as needed for congestion.   SYSTANE OP Place 1-2 drops into both eyes 4 (four) times daily as needed (eye allergies/dryness).   tamsulosin 0.4 MG Caps capsule Commonly known as: FLOMAX Take 0.8 mg by mouth at bedtime.   traMADol 50 MG tablet Commonly known as: ULTRAM Take 1 tablet (50 mg total) by mouth every 4 (four) hours  as needed for moderate pain.   TURMERIC CURCUMIN PO Take 2,250 mg by mouth in the morning and at bedtime.   valsartan 160 MG tablet Commonly known as: DIOVAN Take 160 mg by mouth in the morning.   vitamin C 1000 MG tablet Take 1,000 mg by mouth in the morning and at bedtime.   Vitamin D3 50 MCG (2000 UT) Tabs Take 2,000 Units by mouth in the morning.   ZICAM COLD REMEDY PO Take 1 tablet by mouth every 4 (four) hours as needed. COLD SYMPTOMS   [employer] Leg Cramps Pain Relief Tabs Take 1-2 tablets by mouth every 4 (four) hours as needed (leg cramps).   EARACHE RELIEF OT Place 2 drops in ear(s) daily as needed (earache).               Durable Medical Equipment  (From admission, onward)           Start     Ordered   11/04/20 1205  DME Walker rolling  Once       Question:  Patient needs a walker to treat with the following condition  Answer:  Total knee replacement status   11/04/20 1204   11/04/20 1205  DME Bedside commode  Once       Question:  Patient needs a bedside commode to treat with the following condition  Answer:  Total knee replacement status   11/04/20 1204            Disposition: Home with home health PT     Follow-up Information     Lattie Corns, PA-C Follow up on 11/18/2020.   Specialty: Physician Assistant Why: at 1:15pm Contact information: [place]  [id number] [phone]         Dereck Leep, MD Follow up on 12/17/2020.   Specialty: Orthopedic Surgery Why: at 9:30am Contact information: [place] [address] [place], PA-C 11/05/2020, 11:49 AM

## 2020-11-05 NOTE — Anesthesia Postprocedure Evaluation (Signed)
Anesthesia Post Note  Patient: Andrew Myers  Procedure(s) Performed: COMPUTER ASSISTED TOTAL KNEE ARTHROPLASTY (Left: Knee)  Patient location during evaluation: PACU Anesthesia Type: Spinal Level of consciousness: oriented and awake and alert Pain management: pain level controlled Vital Signs Assessment: post-procedure vital signs reviewed and stable Respiratory status: spontaneous breathing, respiratory function stable and patient connected to nasal cannula oxygen Cardiovascular status: blood pressure returned to baseline and stable Postop Assessment: no headache, no backache and no apparent nausea or vomiting Anesthetic complications: no   No notable events documented.   Last Vitals:  Vitals:   11/04/20 2006 11/05/20 0346  BP: (!) 145/64 (!) 157/68  Pulse: 72 (!) 56  Resp: 17 17  Temp: 36.4 C 36.7 C  SpO2: 96% 99%    Last Pain:  Vitals:   11/04/20 2025  TempSrc:   PainSc: 0-No pain                 [place] Ouch

## 2020-11-05 NOTE — Progress Notes (Signed)
  Subjective: 1 Day Post-Op Procedure(s) (LRB): COMPUTER ASSISTED TOTAL KNEE ARTHROPLASTY (Left) Patient reports pain as well-controlled.   Patient is well, and has had no acute complaints or problems Plan is to go Home after hospital stay. Negative for chest pain and shortness of breath Fever: no Gastrointestinal: negative for nausea and vomiting.  Patient has not had a bowel movement.  Objective: Vital signs in last 24 hours: Temp:  [97 F (36.1 C)-98 F (36.7 C)] 98 F (36.7 C) (09/13 0346) Pulse Rate:  [53-74] 56 (09/13 0346) Resp:  [9-18] 17 (09/13 0346) BP: (124-157)/(57-81) 157/68 (09/13 0346) SpO2:  [96 %-100 %] 99 % (09/13 0346)  Intake/Output from previous day:  Intake/Output Summary (Last 24 hours) at 11/05/2020 0819 Last data filed at 11/05/2020 0643 Gross per 24 hour  Intake 3215.35 ml  Output 2700 ml  Net 515.35 ml    Intake/Output this shift: No intake/output data recorded.  Labs: No results for input(s): HGB in the last 72 hours. No results for input(s): WBC, RBC, HCT, PLT in the last 72 hours. No results for input(s): NA, K, CL, CO2, BUN, CREATININE, GLUCOSE, CALCIUM in the last 72 hours. No results for input(s): LABPT, INR in the last 72 hours.   EXAM General - Patient is Alert, Appropriate, and Oriented Extremity - Neurovascular intact Dorsiflexion/Plantar flexion intact Compartment soft Dressing/Incision -Postoperative dressing remains in place., Polar Care in place and working. , Hemovac in place.  Motor Function - intact, moving foot and toes well on exam. Able to perform independent SLR.  Cardiovascular- Regular rate and rhythm, no murmurs/rubs/gallops Respiratory- Lungs clear to auscultation bilaterally Gastrointestinal- soft, nontender, and active bowel sounds   Assessment/Plan: 1 Day Post-Op Procedure(s) (LRB): COMPUTER ASSISTED TOTAL KNEE ARTHROPLASTY (Left) Active Problems:   Total knee replacement status  Estimated body mass index  is 32.04 kg/m as calculated from the following:   Height as of this encounter: '5\' 9"'$  (1.753 m).   Weight as of this encounter: 98.4 kg. Advance diet Up with therapy  Possible d/c this PM pending completion of PT goals      DVT Prophylaxis - Lovenox, Ted hose, and foot pumps Weight-Bearing as tolerated to left leg  Cassell Smiles, PA-C [facility] Orthopaedic Surgery 11/05/2020, 8:19 AM

## 2020-11-05 NOTE — Evaluation (Signed)
Occupational Therapy Evaluation Patient Details Name: Andrew Myers MRN: [id number] DOB: [birth year] Today's Date: 11/05/2020   History of Present Illness Pt is a 75 yo male s/p L TKA, WBAT. PMH of sleep apnea, HTN, DM, hiatal hernia, previous R TKA in March 2022.   Clinical Impression   Pt seen for OT evaluation this date, POD#1 from above surgery. Pt was independent in all ADL prior to surgery and is eager to return to PLOF with less pain and improved safety and independence. Pt reports recovering well from R TKA in March. Pt currently demonstrates modified independence with LB ADL and endorses his spouse will be available for any assist required upon return home. Pt instructed in polar care mgt, falls prevention strategies, home/routines modifications, DME/AE for LB bathing and dressing tasks, and compression stocking mgt. Handout provided to support recall and carryover. Pt verbalized understanding and used teach back to demonstrate understanding. Pt declined additional OT needs. Will sign off. Please re-consult if additional acute OT needs arise.    Recommendations for follow up therapy are one component of a multi-disciplinary discharge planning process, led by the attending physician.  Recommendations may be updated based on patient status, additional functional criteria and insurance authorization.   Follow Up Recommendations  No OT follow up    Equipment Recommendations  None recommended by OT    Recommendations for Other Services       Precautions / Restrictions Precautions Precautions: Knee Precaution Booklet Issued: Yes (comment) Restrictions Weight Bearing Restrictions: Yes LLE Weight Bearing: Weight bearing as tolerated      Mobility Bed Mobility               General bed mobility comments: Pt seated EOB for breakfast at start and end of session    Transfers                 General transfer comment: declined, eating breakfast    Balance  Overall balance assessment: Needs assistance Sitting-balance support: No upper extremity supported;Feet supported Sitting balance-Leahy Scale: Normal                                     ADL either performed or assessed with clinical judgement   ADL Overall ADL's : Modified independent                                       General ADL Comments: Pt able to demonstrate modified independence with LB ADL tasks. Spouse able to assist as needed for compression stockings, although pt endorses having AE for these that he used after his previous TKA.     Vision         Perception     Praxis      Pertinent Vitals/Pain Pain Assessment: No/denies pain Pain Intervention(s): Premedicated before session     Hand Dominance Right   Extremity/Trunk Assessment Upper Extremity Assessment Upper Extremity Assessment: Overall WFL for tasks assessed   Lower Extremity Assessment Lower Extremity Assessment: LLE deficits/detail LLE Deficits / Details: L TKA LLE Sensation: WNL   Cervical / Trunk Assessment Cervical / Trunk Assessment: Normal   Communication Communication Communication: No difficulties   Cognition Arousal/Alertness: Awake/alert Behavior During Therapy: WFL for tasks assessed/performed Overall Cognitive Status: Within Functional Limits for tasks assessed  General Comments       Exercises Other Exercises Other Exercises: Pt instructed in home/routines modifications, falls prevention, AE/DME, compression stocking mgt, and polar care mgt with handout provided   Shoulder Instructions      Home Living Family/patient expects to be discharged to:: Private residence Living Arrangements: Spouse/significant other Available Help at Discharge: Family;Available 24 hours/day Type of Home: House Home Access: Stairs to enter [employer] of Steps: 2 STE with railing on R. 5 STE front of  house. Entrance Stairs-Rails: Right Home Layout: One level;Full bath on main level     Bathroom Shower/Tub: Occupational psychologist: Handicapped height Bathroom Accessibility: Yes   Home Equipment: Shower seat - built in;Cane - quad;Cane - single point;Crutches;Grab bars - toilet;Toilet riser;Grab bars - tub/shower;Tub bench;Walker - 2 wheels;Bedside commode   Additional Comments: Has variety of equipment to assist in improving knee ROM      Prior Functioning/Environment Level of Independence: Independent                 OT Problem List: Decreased strength;Decreased range of motion      OT Treatment/Interventions:      OT Goals(Current goals can be found in the care plan section) Acute Rehab OT Goals Patient Stated Goal: go home OT Goal Formulation: All assessment and education complete, DC therapy  OT Frequency:     Barriers to D/C:            Co-evaluation              AM-PAC OT "6 Clicks" Daily Activity     Outcome Measure Help from another person eating meals?: None Help from another person taking care of personal grooming?: None Help from another person toileting, which includes using toliet, bedpan, or urinal?: None Help from another person bathing (including washing, rinsing, drying)?: None Help from another person to put on and taking off regular upper body clothing?: None Help from another person to put on and taking off regular lower body clothing?: None 6 Click Score: 24   End of Session    Activity Tolerance: Patient tolerated treatment well Patient left: in bed;with call bell/phone within reach  OT Visit Diagnosis: Other abnormalities of gait and mobility (R26.89)                [id number] OT Time Calculation (min): 14 min Charges:  OT General Charges $OT Visit: 1 Visit OT Evaluation $OT Eval Low Complexity: 1 Low OT Treatments $Self Care/Home Management : 8-22 mins  Ardeth Perfect., MPH, MS, OTR/L ascom  [phone] 11/05/20, 8:54 AM

## 2020-11-05 NOTE — Plan of Care (Signed)
Patient alert and oriented x 4, denies pain with assess. Greater than 200 bloody output from hemovac, remains compressed. Dressing to left lower extremity remains clean dry and intact. Polar care remains to site. Stable condition at end of shift.  Problem: Education: Goal: Knowledge of General Education information will improve Description: Including pain rating scale, medication(s)/side effects and non-pharmacologic comfort measures Outcome: Progressing   Problem: Health Behavior/Discharge Planning: Goal: Ability to manage health-related needs will improve Outcome: Progressing   Problem: Clinical Measurements: Goal: Ability to maintain clinical measurements within normal limits will improve Outcome: Progressing Goal: Will remain free from infection Outcome: Progressing Goal: Diagnostic test results will improve Outcome: Progressing Goal: Respiratory complications will improve Outcome: Progressing Goal: Cardiovascular complication will be avoided Outcome: Progressing

## 2020-11-05 NOTE — Progress Notes (Signed)
Physical Therapy Treatment Patient Details Name: Andrew Myers MRN: [id number] DOB: October 23, 1945 Today's Date: 11/05/2020   History of Present Illness Pt is a 75 yo male s/p L TKA, WBAT. PMH of sleep apnea, HTN, DM, hiatal hernia, previous R TKA in March 2022.    PT Comments    Pt received supine in bed agreeable to PT services. Performed HEP packet prior to OOB mobility with no difficulty. Bandaging limiting L knee AROM. Minor pain reported 2/10 NPS. Supervision to transfer to EOB, stand to RW and amb 300' with good gait speed and step through pattern. Minor VC's for improving L heel strike to enforce terminal knee extension for gait mechanics.  Pt asc/desc 4 stairs with R railing use x2 with forward step to pattern first attempt and side step to pattern with descent on second attempt. Minguard provided with pt displaying safe performance and excellent LLE eccentric control with descent having to use R railing to simulate home environment. Pt returned to supine in bed with LLE in bone foam, polar care donned and all needs in reach. Pt safe to D/c home having accomplished all PT goals.   Recommendations for follow up therapy are one component of a multi-disciplinary discharge planning process, led by the attending physician.  Recommendations may be updated based on patient status, additional functional criteria and insurance authorization.  Follow Up Recommendations  Home health PT     Equipment Recommendations  None recommended by PT    Recommendations for Other Services       Precautions / Restrictions Precautions Precautions: Knee Precaution Booklet Issued: Yes (comment) Restrictions Weight Bearing Restrictions: Yes LLE Weight Bearing: Weight bearing as tolerated     Mobility  Bed Mobility Overal bed mobility: Needs Assistance Bed Mobility: Supine to Sit;Sit to Supine     Supine to sit: Supervision Sit to supine: Supervision   General bed mobility comments: Pt seated  EOB for breakfast at start and end of session Patient Response: Cooperative  Transfers Overall transfer level: Needs assistance Equipment used: Rolling walker (2 wheeled) Transfers: Sit to/from Stand Sit to Stand: Supervision         General transfer comment: VC's for hand placement  Ambulation/Gait Ambulation/Gait assistance: Supervision Gait Distance (Feet): 300 Feet Assistive device: Rolling walker (2 wheeled) Gait Pattern/deviations: Decreased stance time - left;Step-through pattern         Stairs             Wheelchair Mobility    Modified Rankin (Stroke Patients Only)       Balance Overall balance assessment: Needs assistance Sitting-balance support: No upper extremity supported;Feet supported Sitting balance-Leahy Scale: Normal       Standing balance-Leahy Scale: Good Standing balance comment: Able to stand without UE support on RW.                            Cognition Arousal/Alertness: Awake/alert Behavior During Therapy: WFL for tasks assessed/performed Overall Cognitive Status: Within Functional Limits for tasks assessed                                        Exercises Total Joint Exercises Ankle Circles/Pumps: AROM;Both;10 reps;Supine Quad Sets: AROM;Left;10 reps;Supine Short Arc Quad: Left;10 reps;Supine Heel Slides: AROM;Left;10 reps;Supine Hip ABduction/ADduction: AROM;Left;10 reps;Supine;Strengthening Straight Leg Raises: AROM;Left;10 reps;Strengthening;Supine Goniometric ROM: 3-70 AROM Other Exercises Other Exercises: Stair training  Other Exercises: Pt instructed in home/routines modifications, falls prevention, AE/DME, compression stocking mgt, and polar care mgt with handout provided    General Comments General comments (skin integrity, edema, etc.): Able to step backwards and turn CW/CCW for IV line management      Pertinent Vitals/Pain Pain Assessment: No/denies pain Pain Score: 2  Pain  Location: L knee Pain Descriptors / Indicators: Aching Pain Intervention(s): Limited activity within patient's tolerance;Monitored during session;Repositioned;Ice applied    Home Living Family/patient expects to be discharged to:: Private residence Living Arrangements: Spouse/significant other Available Help at Discharge: Family;Available 24 hours/day Type of Home: House Home Access: Stairs to enter Entrance Stairs-Rails: Right Home Layout: One level;Full bath on main level Home Equipment: Shower seat - built in;Cane - quad;Cane - single point;Crutches;Grab bars - toilet;Toilet riser;Grab bars - tub/shower;Tub bench;Walker - 2 wheels;Bedside commode Additional Comments: Has variety of equipment to assist in improving knee ROM    Prior Function Level of Independence: Independent          PT Goals (current goals can now be found in the care plan section) Acute Rehab PT Goals Patient Stated Goal: go home PT Goal Formulation: With patient Time For Goal Achievement: 11/18/20 Potential to Achieve Goals: Good Progress towards PT goals: Progressing toward goals    Frequency    BID      PT Plan Current plan remains appropriate    Co-evaluation              AM-PAC PT "6 Clicks" Mobility   Outcome Measure  Help needed turning from your back to your side while in a flat bed without using bedrails?: None Help needed moving from lying on your back to sitting on the side of a flat bed without using bedrails?: None Help needed moving to and from a bed to a chair (including a wheelchair)?: A Little Help needed standing up from a chair using your arms (e.g., wheelchair or bedside chair)?: A Little Help needed to walk in hospital room?: A Little Help needed climbing 3-5 steps with a railing? : A Little 6 Click Score: 20    End of Session Equipment Utilized During Treatment: Gait belt   Patient left: in bed;with call bell/phone within reach;with SCD's reapplied Nurse  Communication: Mobility status PT Visit Diagnosis: Muscle weakness (generalized) (M62.81)     Time: 0935-1000 PT Time Calculation (min) (ACUTE ONLY): 25 min  Charges:  $Gait Training: 8-22 mins $Therapeutic Exercise: 8-22 mins                    Gertie Broerman M. Fairly IV, PT, DPT Physical Therapist- [facility]  11/05/2020, 10:46 AM

## 2020-11-05 NOTE — TOC Progression Note (Signed)
Transition of Care [facility]) - Progression Note    Patient Details  Name: Andrew Myers MRN: [id number] Date of Birth: 1946-02-01  Transition of [facility]) CM/SW [place], RN Phone Number: 11/05/2020, 12:52 PM  Clinical Narrative:     Met with the patient in the room and discussed DC plan and needs He lives at home with his spouse, he has transportation and can afford his medication, he is set up with [facility] services, he has a rolling walker and a 3 in 1 at home, no additonal needs       Expected Discharge Plan and Services           Expected Discharge Date: 11/05/20                                     Social Determinants of Health (SDOH) Interventions    Readmission Risk Interventions No flowsheet data found.

## 2020-11-05 NOTE — Progress Notes (Signed)
Physical Therapy Treatment Patient Details Name: Andrew Myers MRN: [id number] DOB: 1945-05-12 Today's Date: 11/05/2020   History of Present Illness Pt is a 75 yo male s/p L TKA, WBAT. PMH of sleep apnea, HTN, DM, hiatal hernia, previous R TKA in March 2022.    PT Comments    Pt received seated upright in bed. Willing to participate one last time prior to D/c. Pt declines need for going over HEP packet and revisiting stairs. Pt seems confident and comfortable with getting in/out of home. Pt supervision for all of mobility. Really requesting to amb some with PT prior to leaving.  Amb 180' with RW with VC's from PT to progress heel strike to promote terminal knee extension on L side with good carryover. Pt displaying good gait speed with minimal support on RW with no antalgia noted and is steady with no LOB. Pt returned to seated in bed with all needs in reach. All questions addressed by PT. Goals updated and met. D/c recs still remain appropriate.   Recommendations for follow up therapy are one component of a multi-disciplinary discharge planning process, led by the attending physician.  Recommendations may be updated based on patient status, additional functional criteria and insurance authorization.  Follow Up Recommendations  Home health PT     Equipment Recommendations  None recommended by PT    Recommendations for Other Services       Precautions / Restrictions Precautions Precautions: Knee Precaution Booklet Issued: Yes (comment) Restrictions Weight Bearing Restrictions: Yes LLE Weight Bearing: Weight bearing as tolerated     Mobility  Bed Mobility Overal bed mobility: Needs Assistance Bed Mobility: Supine to Sit;Sit to Supine     Supine to sit: Supervision Sit to supine: Supervision     Patient Response: Cooperative  Transfers Overall transfer level: Needs assistance Equipment used: Rolling walker (2 wheeled) Transfers: Sit to/from Stand Sit to Stand:  Supervision         General transfer comment: Safe use of hands with standing to RW.  Ambulation/Gait Ambulation/Gait assistance: Supervision Gait Distance (Feet): 180 Feet Assistive device: Rolling walker (2 wheeled) Gait Pattern/deviations: Decreased stance time - left;Step-through pattern         Stairs             Wheelchair Mobility    Modified Rankin (Stroke Patients Only)       Balance Overall balance assessment: Needs assistance Sitting-balance support: No upper extremity supported;Feet supported Sitting balance-Leahy Scale: Normal       Standing balance-Leahy Scale: Good Standing balance comment: Able to stand without UE support on RW. Abel to anteriorluy reach across RW with SUE support to grab item off counter top without LOB.                            Cognition Arousal/Alertness: Awake/alert Behavior During Therapy: WFL for tasks assessed/performed Overall Cognitive Status: Within Functional Limits for tasks assessed                                        Exercises Total Joint Exercises Ankle Circles/Pumps: AROM;Both;10 reps;Supine Quad Sets: AROM;Left;10 reps;Supine Short Arc Quad: Left;10 reps;Supine Heel Slides: AROM;Left;10 reps;Supine Hip ABduction/ADduction: AROM;Left;10 reps;Supine;Strengthening Straight Leg Raises: AROM;Left;10 reps;Strengthening;Supine Goniometric ROM: 3-70 AROM Other Exercises Other Exercises: addressed any questions and concerns pt had.    General Comments General comments (skin  integrity, edema, etc.): Able to step backwards and turn CW/CCW for IV line management      Pertinent Vitals/Pain Pain Assessment: 0-10 Pain Score: 2  Pain Location: L knee with first few steps of ambulation Pain Descriptors / Indicators: Aching Pain Intervention(s): Limited activity within patient's tolerance;Monitored during session;Repositioned;Ice applied    Home Living                       Prior Function            PT Goals (current goals can now be found in the care plan section) Acute Rehab PT Goals Patient Stated Goal: go home PT Goal Formulation: With patient Time For Goal Achievement: 11/18/20 Potential to Achieve Goals: Good Progress towards PT goals: Progressing toward goals    Frequency    BID      PT Plan Current plan remains appropriate    Co-evaluation              AM-PAC PT "6 Clicks" Mobility   Outcome Measure  Help needed turning from your back to your side while in a flat bed without using bedrails?: None Help needed moving from lying on your back to sitting on the side of a flat bed without using bedrails?: None Help needed moving to and from a bed to a chair (including a wheelchair)?: A Little Help needed standing up from a chair using your arms (e.g., wheelchair or bedside chair)?: A Little Help needed to walk in hospital room?: A Little Help needed climbing 3-5 steps with a railing? : A Little 6 Click Score: 20    End of Session Equipment Utilized During Treatment: Gait belt Activity Tolerance: Patient tolerated treatment well Patient left: in bed;with call bell/phone within reach Nurse Communication: Mobility status PT Visit Diagnosis: Muscle weakness (generalized) (M62.81)     Time: 1517-6160 PT Time Calculation (min) (ACUTE ONLY): 8 min  Charges:  $Gait Training: 8-22 mins $Therapeutic Exercise: 8-22 mins                    Andrew Myers. Fairly IV, PT, DPT Physical Therapist- [facility]  11/05/2020, 1:32 PM

## 2020-11-06 DIAGNOSIS — R972 Elevated prostate specific antigen [PSA]: Secondary | ICD-10-CM | POA: Diagnosis not present

## 2020-11-06 DIAGNOSIS — Z87891 Personal history of nicotine dependence: Secondary | ICD-10-CM | POA: Diagnosis not present

## 2020-11-06 DIAGNOSIS — E785 Hyperlipidemia, unspecified: Secondary | ICD-10-CM | POA: Diagnosis not present

## 2020-11-06 DIAGNOSIS — Z6832 Body mass index (BMI) 32.0-32.9, adult: Secondary | ICD-10-CM | POA: Diagnosis not present

## 2020-11-06 DIAGNOSIS — Z471 Aftercare following joint replacement surgery: Secondary | ICD-10-CM | POA: Diagnosis not present

## 2020-11-06 DIAGNOSIS — G4730 Sleep apnea, unspecified: Secondary | ICD-10-CM | POA: Diagnosis not present

## 2020-11-06 DIAGNOSIS — M542 Cervicalgia: Secondary | ICD-10-CM | POA: Diagnosis not present

## 2020-11-06 DIAGNOSIS — J869 Pyothorax without fistula: Secondary | ICD-10-CM | POA: Diagnosis not present

## 2020-11-06 DIAGNOSIS — Z794 Long term (current) use of insulin: Secondary | ICD-10-CM | POA: Diagnosis not present

## 2020-11-06 DIAGNOSIS — J309 Allergic rhinitis, unspecified: Secondary | ICD-10-CM | POA: Diagnosis not present

## 2020-11-06 DIAGNOSIS — N401 Enlarged prostate with lower urinary tract symptoms: Secondary | ICD-10-CM | POA: Diagnosis not present

## 2020-11-06 DIAGNOSIS — K449 Diaphragmatic hernia without obstruction or gangrene: Secondary | ICD-10-CM | POA: Diagnosis not present

## 2020-11-06 DIAGNOSIS — Z85828 Personal history of other malignant neoplasm of skin: Secondary | ICD-10-CM | POA: Diagnosis not present

## 2020-11-06 DIAGNOSIS — Z96652 Presence of left artificial knee joint: Secondary | ICD-10-CM | POA: Diagnosis not present

## 2020-11-06 DIAGNOSIS — R3915 Urgency of urination: Secondary | ICD-10-CM | POA: Diagnosis not present

## 2020-11-06 DIAGNOSIS — K5900 Constipation, unspecified: Secondary | ICD-10-CM | POA: Diagnosis not present

## 2020-11-06 DIAGNOSIS — Z86010 Personal history of colonic polyps: Secondary | ICD-10-CM | POA: Diagnosis not present

## 2020-11-06 DIAGNOSIS — E669 Obesity, unspecified: Secondary | ICD-10-CM | POA: Diagnosis not present

## 2020-11-06 DIAGNOSIS — Z79891 Long term (current) use of opiate analgesic: Secondary | ICD-10-CM | POA: Diagnosis not present

## 2020-11-06 DIAGNOSIS — Z87442 Personal history of urinary calculi: Secondary | ICD-10-CM | POA: Diagnosis not present

## 2020-11-06 DIAGNOSIS — K219 Gastro-esophageal reflux disease without esophagitis: Secondary | ICD-10-CM | POA: Diagnosis not present

## 2020-11-06 DIAGNOSIS — E1165 Type 2 diabetes mellitus with hyperglycemia: Secondary | ICD-10-CM | POA: Diagnosis not present

## 2020-11-06 DIAGNOSIS — J90 Pleural effusion, not elsewhere classified: Secondary | ICD-10-CM | POA: Diagnosis not present

## 2020-11-06 DIAGNOSIS — I10 Essential (primary) hypertension: Secondary | ICD-10-CM | POA: Diagnosis not present

## 2020-11-09 DIAGNOSIS — Z96652 Presence of left artificial knee joint: Secondary | ICD-10-CM | POA: Diagnosis not present

## 2020-11-09 DIAGNOSIS — E669 Obesity, unspecified: Secondary | ICD-10-CM | POA: Diagnosis not present

## 2020-11-09 DIAGNOSIS — G4730 Sleep apnea, unspecified: Secondary | ICD-10-CM | POA: Diagnosis not present

## 2020-11-09 DIAGNOSIS — R3915 Urgency of urination: Secondary | ICD-10-CM | POA: Diagnosis not present

## 2020-11-09 DIAGNOSIS — K5900 Constipation, unspecified: Secondary | ICD-10-CM | POA: Diagnosis not present

## 2020-11-09 DIAGNOSIS — R972 Elevated prostate specific antigen [PSA]: Secondary | ICD-10-CM | POA: Diagnosis not present

## 2020-11-09 DIAGNOSIS — I10 Essential (primary) hypertension: Secondary | ICD-10-CM | POA: Diagnosis not present

## 2020-11-09 DIAGNOSIS — E1165 Type 2 diabetes mellitus with hyperglycemia: Secondary | ICD-10-CM | POA: Diagnosis not present

## 2020-11-09 DIAGNOSIS — Z6832 Body mass index (BMI) 32.0-32.9, adult: Secondary | ICD-10-CM | POA: Diagnosis not present

## 2020-11-09 DIAGNOSIS — Z86010 Personal history of colonic polyps: Secondary | ICD-10-CM | POA: Diagnosis not present

## 2020-11-09 DIAGNOSIS — Z79891 Long term (current) use of opiate analgesic: Secondary | ICD-10-CM | POA: Diagnosis not present

## 2020-11-09 DIAGNOSIS — J869 Pyothorax without fistula: Secondary | ICD-10-CM | POA: Diagnosis not present

## 2020-11-09 DIAGNOSIS — Z471 Aftercare following joint replacement surgery: Secondary | ICD-10-CM | POA: Diagnosis not present

## 2020-11-09 DIAGNOSIS — J309 Allergic rhinitis, unspecified: Secondary | ICD-10-CM | POA: Diagnosis not present

## 2020-11-09 DIAGNOSIS — E785 Hyperlipidemia, unspecified: Secondary | ICD-10-CM | POA: Diagnosis not present

## 2020-11-09 DIAGNOSIS — M542 Cervicalgia: Secondary | ICD-10-CM | POA: Diagnosis not present

## 2020-11-09 DIAGNOSIS — K219 Gastro-esophageal reflux disease without esophagitis: Secondary | ICD-10-CM | POA: Diagnosis not present

## 2020-11-09 DIAGNOSIS — Z794 Long term (current) use of insulin: Secondary | ICD-10-CM | POA: Diagnosis not present

## 2020-11-09 DIAGNOSIS — Z87891 Personal history of nicotine dependence: Secondary | ICD-10-CM | POA: Diagnosis not present

## 2020-11-09 DIAGNOSIS — N401 Enlarged prostate with lower urinary tract symptoms: Secondary | ICD-10-CM | POA: Diagnosis not present

## 2020-11-09 DIAGNOSIS — J90 Pleural effusion, not elsewhere classified: Secondary | ICD-10-CM | POA: Diagnosis not present

## 2020-11-09 DIAGNOSIS — K449 Diaphragmatic hernia without obstruction or gangrene: Secondary | ICD-10-CM | POA: Diagnosis not present

## 2020-11-09 DIAGNOSIS — Z87442 Personal history of urinary calculi: Secondary | ICD-10-CM | POA: Diagnosis not present

## 2020-11-09 DIAGNOSIS — Z85828 Personal history of other malignant neoplasm of skin: Secondary | ICD-10-CM | POA: Diagnosis not present

## 2020-11-13 DIAGNOSIS — G4733 Obstructive sleep apnea (adult) (pediatric): Secondary | ICD-10-CM | POA: Diagnosis not present

## 2020-11-18 DIAGNOSIS — Z96652 Presence of left artificial knee joint: Secondary | ICD-10-CM | POA: Diagnosis not present

## 2020-11-18 DIAGNOSIS — M25662 Stiffness of left knee, not elsewhere classified: Secondary | ICD-10-CM | POA: Diagnosis not present

## 2020-11-18 DIAGNOSIS — M6281 Muscle weakness (generalized): Secondary | ICD-10-CM | POA: Diagnosis not present

## 2020-11-18 DIAGNOSIS — M25562 Pain in left knee: Secondary | ICD-10-CM | POA: Diagnosis not present

## 2020-11-20 DIAGNOSIS — Z471 Aftercare following joint replacement surgery: Secondary | ICD-10-CM | POA: Diagnosis not present

## 2020-11-25 DIAGNOSIS — Z96652 Presence of left artificial knee joint: Secondary | ICD-10-CM | POA: Diagnosis not present

## 2020-11-25 DIAGNOSIS — M25562 Pain in left knee: Secondary | ICD-10-CM | POA: Diagnosis not present

## 2020-11-27 DIAGNOSIS — Z96652 Presence of left artificial knee joint: Secondary | ICD-10-CM | POA: Diagnosis not present

## 2020-12-02 DIAGNOSIS — M25562 Pain in left knee: Secondary | ICD-10-CM | POA: Diagnosis not present

## 2020-12-02 DIAGNOSIS — Z96652 Presence of left artificial knee joint: Secondary | ICD-10-CM | POA: Diagnosis not present

## 2020-12-04 DIAGNOSIS — Z96652 Presence of left artificial knee joint: Secondary | ICD-10-CM | POA: Diagnosis not present

## 2020-12-04 DIAGNOSIS — M25562 Pain in left knee: Secondary | ICD-10-CM | POA: Diagnosis not present

## 2020-12-09 DIAGNOSIS — I152 Hypertension secondary to endocrine disorders: Secondary | ICD-10-CM | POA: Diagnosis not present

## 2020-12-09 DIAGNOSIS — E1159 Type 2 diabetes mellitus with other circulatory complications: Secondary | ICD-10-CM | POA: Diagnosis not present

## 2020-12-09 DIAGNOSIS — Z96652 Presence of left artificial knee joint: Secondary | ICD-10-CM | POA: Diagnosis not present

## 2020-12-09 DIAGNOSIS — Z125 Encounter for screening for malignant neoplasm of prostate: Secondary | ICD-10-CM | POA: Diagnosis not present

## 2020-12-09 DIAGNOSIS — Z79899 Other long term (current) drug therapy: Secondary | ICD-10-CM | POA: Diagnosis not present

## 2020-12-09 DIAGNOSIS — M25562 Pain in left knee: Secondary | ICD-10-CM | POA: Diagnosis not present

## 2020-12-09 DIAGNOSIS — E7800 Pure hypercholesterolemia, unspecified: Secondary | ICD-10-CM | POA: Diagnosis not present

## 2020-12-13 DIAGNOSIS — M25562 Pain in left knee: Secondary | ICD-10-CM | POA: Diagnosis not present

## 2020-12-13 DIAGNOSIS — Z96652 Presence of left artificial knee joint: Secondary | ICD-10-CM | POA: Diagnosis not present

## 2020-12-16 DIAGNOSIS — Z0000 Encounter for general adult medical examination without abnormal findings: Secondary | ICD-10-CM | POA: Diagnosis not present

## 2020-12-16 DIAGNOSIS — Z1331 Encounter for screening for depression: Secondary | ICD-10-CM | POA: Diagnosis not present

## 2020-12-17 DIAGNOSIS — Z96652 Presence of left artificial knee joint: Secondary | ICD-10-CM | POA: Diagnosis not present

## 2021-01-23 DIAGNOSIS — E042 Nontoxic multinodular goiter: Secondary | ICD-10-CM | POA: Diagnosis not present

## 2021-01-27 DIAGNOSIS — R0600 Dyspnea, unspecified: Secondary | ICD-10-CM | POA: Diagnosis not present

## 2021-02-03 DIAGNOSIS — L821 Other seborrheic keratosis: Secondary | ICD-10-CM | POA: Diagnosis not present

## 2021-02-03 DIAGNOSIS — L578 Other skin changes due to chronic exposure to nonionizing radiation: Secondary | ICD-10-CM | POA: Diagnosis not present

## 2021-02-03 DIAGNOSIS — Z872 Personal history of diseases of the skin and subcutaneous tissue: Secondary | ICD-10-CM | POA: Diagnosis not present

## 2021-02-03 DIAGNOSIS — Z85828 Personal history of other malignant neoplasm of skin: Secondary | ICD-10-CM | POA: Diagnosis not present

## 2021-02-03 DIAGNOSIS — Z86018 Personal history of other benign neoplasm: Secondary | ICD-10-CM | POA: Diagnosis not present

## 2021-02-06 DIAGNOSIS — N401 Enlarged prostate with lower urinary tract symptoms: Secondary | ICD-10-CM | POA: Diagnosis not present

## 2021-02-06 DIAGNOSIS — R350 Frequency of micturition: Secondary | ICD-10-CM | POA: Diagnosis not present

## 2021-02-06 DIAGNOSIS — R972 Elevated prostate specific antigen [PSA]: Secondary | ICD-10-CM | POA: Diagnosis not present

## 2021-02-17 DIAGNOSIS — G4733 Obstructive sleep apnea (adult) (pediatric): Secondary | ICD-10-CM | POA: Diagnosis not present

## 2021-02-18 ENCOUNTER — Other Ambulatory Visit: Payer: Self-pay | Admitting: Urology

## 2021-02-18 DIAGNOSIS — R972 Elevated prostate specific antigen [PSA]: Secondary | ICD-10-CM

## 2021-02-27 ENCOUNTER — Ambulatory Visit
Admission: EM | Admit: 2021-02-27 | Discharge: 2021-02-27 | Disposition: A | Discharge status: Home / Self Care | Payer: PPO | Attending: Emergency Medicine | Admitting: Emergency Medicine

## 2021-02-27 ENCOUNTER — Other Ambulatory Visit: Payer: Self-pay

## 2021-02-27 DIAGNOSIS — J069 Acute upper respiratory infection, unspecified: Secondary | ICD-10-CM | POA: Diagnosis not present

## 2021-02-27 MED ORDER — BENZONATATE 100 MG PO CAPS: 200.0000 mg | ORAL_CAPSULE | Freq: Three times a day (TID) | ORAL | 0 refills | Status: AC

## 2021-02-27 MED ORDER — IPRATROPIUM BROMIDE 0.06 % NA SOLN: 2.0000 | Freq: Four times a day (QID) | NASAL | 12 refills | Status: AC

## 2021-02-27 MED ORDER — DOXYCYCLINE HYCLATE 100 MG PO CAPS: 100.0000 mg | ORAL_CAPSULE | Freq: Two times a day (BID) | ORAL | 0 refills | Status: AC

## 2021-02-27 MED ORDER — PROMETHAZINE-DM 6.25-15 MG/5ML PO SYRP: 5.0000 mL | ORAL_SOLUTION | Freq: Four times a day (QID) | ORAL | 0 refills | Status: DC | PRN

## 2021-02-27 NOTE — ED Provider Notes (Signed)
[facility]    CSN: [id number] Arrival date & time: 02/27/21  1621      History   Chief Complaint Chief Complaint  Patient presents with   Cough    HPI Jacksyn Beeks is a 76 y.o. male.   HPI  76 year old male here for evaluation of respiratory symptoms.  Patient reports that for last 2 weeks she has been experiencing nasal congestion with green nasal discharge, postnasal drip, sore throat, cough that is productive for green sputum, shortness of breath, and wheezing.  He denies fever, ear pain, or GI complaints.  Past Medical History:  Diagnosis Date   Allergic rhinitis    Arthritis    BPH (benign prostatic hyperplasia)    CAP (community acquired pneumonia) 04/2019   Empyema ([place])    Empyema lung ([place]) 06/2019   Family history of adverse reaction to anesthesia    Sister with nausea and vomiting   GERD (gastroesophageal reflux disease)    Hiatal hernia    History of adenomatous polyp of colon    History of chickenpox    History of kidney stones    History of wrist fracture    Hyperlipidemia    Hypertension    Obesity    Pleural effusion on right 05/2019   Pre-diabetes    Skin cancer    BCC, SCC   Sleep apnea    Uses Bi-Pap   Sleep apnea     Patient Active Problem List   Diagnosis Date Noted   Total knee replacement status 11/04/2020   Status post total right knee replacement 05/06/2020   Primary osteoarthritis of left knee 12/17/2019   Primary osteoarthritis of right knee 12/17/2019   Neck pain 07/18/2019   Empyema ([place]) 07/18/2019   Joint pain 07/18/2019   Hypokalemia 07/05/2019   Hyperglycemia 07/05/2019   Empyema of right pleural space ([place]) 07/05/2019   Hypertension    Hyperlipidemia    BPH (benign prostatic hyperplasia)    Obesity    Sleep apnea    Benign prostatic hyperplasia (BPH) with urinary urgency 05/02/2019   Elevated PSA 10/27/2018   Type 2 diabetes mellitus without complication, without long-term current use of  insulin ([place]) 10/27/2018   Benign essential hypertension 09/11/2013   GERD (gastroesophageal reflux disease) 09/11/2013    Past Surgical History:  Procedure Laterality Date   APPENDECTOMY     CATARACT EXTRACTION Bilateral 2019   COLONOSCOPY N/A 01/09/2020   Procedure: COLONOSCOPY;  Surgeon: Lesly Rubenstein, MD;  Location: [facility] ENDOSCOPY;  Service: Endoscopy;  Laterality: N/A;   EMPYEMA DRAINAGE     EYE SURGERY     Laser surgery 2020   FINE NEEDLE ASPIRATION BIOPSY  04/16/2020   ISTHMUS THYROID AND MIDDLE LEFT THYROID -BOTH BENIGN   FRACTURE SURGERY  2003   repair 5th metacarpal fracture    JOINT REPLACEMENT     KNEE ARTHROPLASTY Right 05/06/2020   Procedure: COMPUTER ASSISTED TOTAL KNEE ARTHROPLASTY;  Surgeon: Dereck Leep, MD;  Location: [facility] ORS;  Service: Orthopedics;  Laterality: Right;   KNEE ARTHROPLASTY Left 11/04/2020   Procedure: COMPUTER ASSISTED TOTAL KNEE ARTHROPLASTY;  Surgeon: Dereck Leep, MD;  Location: [facility] ORS;  Service: Orthopedics;  Laterality: Left;   LUMBAR PUNCTURE     PROSTATE BIOPSY  2016       Home Medications    Prior to Admission medications   Medication Sig Start Date End Date Taking? Authorizing Provider  acetaminophen (TYLENOL) 500 MG tablet Take 500 mg by mouth  every 6 (six) hours as needed (pain.).   Yes [provider]  Ascorbic Acid (VITAMIN C) 1000 MG tablet Take 1,000 mg by mouth in the morning and at bedtime.   Yes [provider]  atorvastatin (LIPITOR) 20 MG tablet Take 20 mg by mouth at bedtime.    Yes [provider]  Bacillus Coagulans-Inulin (PROBIOTIC-PREBIOTIC PO) Take 1 capsule by mouth in the morning.   Yes [provider]  benzonatate (TESSALON) 100 MG capsule Take 2 capsules (200 mg total) by mouth every 8 (eight) hours. 02/27/21  Yes Margarette Canada, NP  bisacodyl (DULCOLAX) 5 MG EC tablet Take 5-15 mg by mouth daily as needed for moderate constipation.   Yes [provider]   calcium carbonate (TUMS EX) 750 MG chewable tablet Chew 1 tablet by mouth daily as needed for heartburn.   Yes [provider]  cetirizine (ZYRTEC) 10 MG tablet Take 10 mg by mouth in the morning.   Yes [provider]  Cholecalciferol (VITAMIN D3) 50 MCG (2000 UT) TABS Take 2,000 Units by mouth in the morning.   Yes [provider]  [facility] Liver Oil 1250-130 units CAPS Take 1 capsule by mouth in the morning.   Yes [provider]  Digestive Enzymes (PAPAYA AND ENZYMES PO) Take 4 capsules by mouth daily as needed (digestive health).   Yes [provider]  diphenhydrAMINE (BENADRYL) 25 mg capsule Take 25 mg by mouth every 6 (six) hours as needed for itching.   Yes [provider]  docusate sodium (COLACE) 100 MG capsule Take 100-300 mg by mouth daily as needed for mild constipation.   Yes [provider]  doxycycline (VIBRAMYCIN) 100 MG capsule Take 1 capsule (100 mg total) by mouth 2 (two) times daily. 02/27/21  Yes Margarette Canada, NP  fluticasone (FLONASE) 50 MCG/ACT nasal spray Place 1 spray into both nostrils daily as needed for allergies.   Yes [provider]  Homeopathic Products ([employer] LEG CRAMPS PAIN RELIEF) TABS Take 1-2 tablets by mouth every 4 (four) hours as needed (leg cramps).   Yes [provider]  Homeopathic Products (EARACHE RELIEF OT) Place 2 drops in ear(s) daily as needed (earache).   Yes [provider]  Homeopathic Products [facility] COLD REMEDY PO) Take 1 tablet by mouth every 4 (four) hours as needed. COLD SYMPTOMS   Yes [provider]  hydrochlorothiazide (HYDRODIURIL) 50 MG tablet Take 25 mg by mouth in the morning. 07/29/20  Yes [provider]  ipratropium (ATROVENT) 0.06 % nasal spray Place 2 sprays into both nostrils 4 (four) times daily. 02/27/21  Yes Margarette Canada, NP  L-ARGININE PO Take 1 capsule by mouth in the morning and at bedtime. With  L-citrulline   Yes [provider]  loperamide (IMODIUM) 2 MG capsule Take 2-4 mg by mouth 4 (four) times daily as needed for diarrhea or loose stools.   Yes [provider]  Magnesium Salicylate Tetrahyd (PERCOGESIC BACKACHE RELIEF) 580 MG TABS Take 2 tablets by mouth every 6 (six) hours as needed (back pain).   Yes [provider]  melatonin 5 MG TABS Take 5 mg by mouth at bedtime as needed (sleep).   Yes [provider]  miconazole (MICOTIN) 2 % powder Apply 1 application topically 2 (two) times daily as needed for itching (jock itch).   Yes [provider]  Multiple Vitamin (MULTIVITAMIN WITH MINERALS) TABS tablet Take 1 tablet by mouth in the morning.   Yes  [provider]  nebivolol (BYSTOLIC) 5 MG tablet Take 5 mg by mouth in the morning.   Yes [provider]  neomycin-bacitracin-polymyxin (NEOSPORIN) [phone] ointment Apply 1 application topically 4 (four) times daily as needed (cuts/scrapes/wound care).   Yes [provider]  Omega-3 Fatty Acids (FISH OIL) 1200 MG CAPS Take 1,200 mg by mouth in the morning and at bedtime.   Yes [provider]  omeprazole (PRILOSEC) 20 MG capsule Take 20 mg by mouth in the morning.   Yes [provider]  OVER THE COUNTER MEDICATION Take 1,200 mg by mouth in the morning and at bedtime. BEET ROOT CAPSULES   Yes [provider]  [place]   Yes [provider]  PHOSPHATIDYLSERINE PO Take 400 mg by mouth in the morning and at bedtime.   Yes [provider]  Polyethyl Glycol-Propyl Glycol (SYSTANE OP) Place 1-2 drops into both eyes 4 (four) times daily as needed (eye allergies/dryness).   Yes [provider]  polyethylene glycol powder (GLYCOLAX/MIRALAX) 17 GM/SCOOP powder Take 17 g by mouth daily as needed for mild constipation.   Yes [provider]  Potassium 99 MG TABS Take 99 mg by mouth in the morning.    Yes [provider]  Pramox-PE-Glycerin-Petrolatum (PREPARATION H) 1-0.25-14.4-15 % CREA Apply 1 application topically as needed (rectal discomfort).   Yes [provider]  promethazine-dextromethorphan (PROMETHAZINE-DM) 6.25-15 MG/5ML syrup Take 5 mLs by mouth 4 (four) times daily as needed. 02/27/21  Yes Margarette Canada, NP  pseudoephedrine (SUDAFED) 30 MG tablet Take 60 mg by mouth every 4 (four) hours as needed for congestion.   Yes [provider]  Scar Treatment Products [facility]) GEL Apply 1 application topically daily.   Yes [provider]  sildenafil (REVATIO) 20 MG tablet Take 20-100 mg by mouth daily as needed (ED).    Yes [provider]  Simethicone 125 MG CAPS Take 125-250 mg by mouth 3 (three) times daily as needed (gas relief).   Yes [provider]  sodium chloride (OCEAN) 0.65 % SOLN nasal spray Place 1 spray into both nostrils as needed for congestion.   Yes [provider]  tamsulosin (FLOMAX) 0.4 MG CAPS capsule Take 0.8 mg by mouth at bedtime.   Yes [provider]  TURMERIC CURCUMIN PO Take 2,250 mg by mouth in the morning and at bedtime.   Yes [provider]  valsartan (DIOVAN) 160 MG tablet Take 160 mg by mouth in the morning. 10/27/18  Yes [provider]  losartan (COZAAR) 100 MG tablet Take 100 mg by mouth daily. 09/05/18 01/05/19  [provider]  losartan-hydrochlorothiazide (HYZAAR) 100-12.5 MG tablet Take 1 tablet by mouth daily.  10/03/18  [provider]    Family History Family History  Problem Relation Age of Onset   Breast cancer Mother    Hypertension Mother    Hyperlipidemia Mother    Heart failure Mother    Arthritis Mother    Heart attack Father    Hypertension Father    Thyroid cancer Father    Thyroid cancer Sister    Heart failure Sister    Heart disease Brother    Alcohol abuse Brother    Gout Niece     Social History Social History    Tobacco Use   Smoking status: Former    Types: Pipe    Quit date: 05/07/1967    Years since quitting: 26.8  Smokeless tobacco: Never  Vaping Use   Vaping Use: Never used  Substance Use Topics   Alcohol use: Yes    Comment: rarely   Drug use: Never     Allergies   Penicillins, Augmentin [amoxicillin-pot clavulanate], Codeine, and Wound dressing adhesive   Review of Systems Review of Systems  Constitutional:  Negative for activity change, appetite change and fever.  HENT:  Positive for congestion, postnasal drip, rhinorrhea and sore throat. Negative for ear pain.   Respiratory:  Positive for cough, shortness of breath and wheezing.   Gastrointestinal:  Negative for diarrhea, nausea and vomiting.  Skin:  Negative for rash.  Hematological: Negative.   Psychiatric/Behavioral: Negative.      Physical Exam Triage Vital Signs ED Triage Vitals  Enc Vitals Group     BP 02/27/21 1719 (!) 160/78     Pulse Rate 02/27/21 1719 67     Resp 02/27/21 1719 18     Temp 02/27/21 1719 98.7 F (37.1 C)     Temp Source 02/27/21 1719 Oral     SpO2 02/27/21 1719 98 %     Weight 02/27/21 1718 231 lb (104.8 kg)     Height 02/27/21 1718 5\' 10"  (1.778 m)     Head Circumference --      Peak Flow --      Pain Score 02/27/21 1718 0     Pain Loc --      Pain Edu? --      Excl. in [place]? --    No data found.  Updated Vital Signs BP (!) 160/78 (BP Location: Left Arm)    Pulse 67    Temp 98.7 F (37.1 C) (Oral)    Resp 18    Ht 5\' 10"  (1.778 m)    Wt 231 lb (104.8 kg)    SpO2 98%    BMI 33.15 kg/m   Visual Acuity Right Eye Distance:   Left Eye Distance:   Bilateral Distance:    Right Eye Near:   Left Eye Near:    Bilateral Near:     Physical Exam Vitals and nursing note reviewed.  Constitutional:      General: He is not in acute distress.    Appearance: Normal appearance. He is not ill-appearing.  HENT:     Head: Normocephalic and atraumatic.     Right Ear: Tympanic membrane, ear  canal and external ear normal. There is no impacted cerumen.     Left Ear: Tympanic membrane, ear canal and external ear normal. There is no impacted cerumen.     Nose: Congestion and rhinorrhea present.     Mouth/Throat:     Mouth: Mucous membranes are moist.     Pharynx: Oropharynx is clear. Posterior oropharyngeal erythema present.  Cardiovascular:     Rate and Rhythm: Normal rate and regular rhythm.     Pulses: Normal pulses.     Heart sounds: Normal heart sounds. No murmur heard.   No friction rub. No gallop.  Pulmonary:     Effort: Pulmonary effort is normal.     Breath sounds: Normal breath sounds. No wheezing, rhonchi or rales.  Musculoskeletal:     Cervical back: Normal range of motion and neck supple.  Lymphadenopathy:     Cervical: No cervical adenopathy.  Skin:    General: Skin is warm and dry.     Capillary Refill: Capillary refill takes less than 2 seconds.     Findings: No erythema or rash.  Neurological:  General: No focal deficit present.     Mental Status: He is alert and oriented to person, place, and time.  Psychiatric:        Mood and Affect: Mood normal.        Behavior: Behavior normal.        Thought Content: Thought content normal.        Judgment: Judgment normal.     UC Treatments / Results  Labs (all labs ordered are listed, but only abnormal results are displayed) Labs Reviewed - No data to display  EKG   Radiology No results found.  Procedures Procedures (including critical care time)  Medications Ordered in UC Medications - No data to display  Initial Impression / Assessment and Plan / UC Course  I have reviewed the triage vital signs and the nursing notes.  Pertinent labs & imaging results that were available during my care of the patient were reviewed by me and considered in my medical decision making (see chart for details).  Patient is a nontoxic-appearing 76 year old male here for evaluation of sinus complaints that been  going for the past 2 weeks as outlined in HPI above.  Patient's physical exam reveals pearly gray tympanic membranes bilaterally with normal light reflex and clear external auditory canals.  Nasal mucosa is erythematous and edematous with a mixture of purulent discharge and also bloody discharge in the right nare.  Patient does not have tenderness to percussion of either frontal or maxillary sinuses bilaterally.  Oropharyngeal exam reveals mild posterior oropharyngeal erythema with clear postnasal drip.  No cervical lymphadenopathy appreciated on exam.  Cardiopulmonary exam reveals clear lung sounds in all fields.  Patient's exam is consistent with an upper respiratory infection.  Given the protracted duration of symptoms we will do a trial of antibiotics.  Patient is allergic to penicillin so we will do doxycycline twice daily for 10 days.  We will also give Atrovent nasal spray to help with the nasal congestion, Tessalon Perles and Promethazine DM cough syrup help with cough and congestion.  Return precautions reviewed with patient.   Final Clinical Impressions(s) / UC Diagnoses   Final diagnoses:  Upper respiratory tract infection, unspecified type     Discharge Instructions      Take the Doxycycline twice daily with food for 10 days for treatment of your URI.   Use the Atrovent nasal spray, 2 squirts in each nostril every 6 hours, as needed for runny nose and postnasal drip.  Use the Tessalon Perles every 8 hours during the day.  Take them with a small sip of water.  They may give you some numbness to the base of your tongue or a metallic taste in your mouth, this is normal.  Use the Promethazine DM cough syrup at bedtime for cough and congestion.  It will make you drowsy so do not take it during the day.  Return for reevaluation or see your primary care provider for any new or worsening symptoms.      ED Prescriptions     Medication Sig Dispense Auth. Provider   doxycycline  (VIBRAMYCIN) 100 MG capsule Take 1 capsule (100 mg total) by mouth 2 (two) times daily. 20 capsule Margarette Canada, NP   ipratropium (ATROVENT) 0.06 % nasal spray Place 2 sprays into both nostrils 4 (four) times daily. 15 mL Margarette Canada, NP   benzonatate (TESSALON) 100 MG capsule Take 2 capsules (200 mg total) by mouth every 8 (eight) hours. 21 capsule Margarette Canada, NP  promethazine-dextromethorphan (PROMETHAZINE-DM) 6.25-15 MG/5ML syrup Take 5 mLs by mouth 4 (four) times daily as needed. 118 mL Margarette Canada, NP      PDMP not reviewed this encounter.   Margarette Canada, NP 02/27/21 1755

## 2021-02-27 NOTE — ED Triage Notes (Signed)
Pt here with C/O Sinus congestion, drainage, cough sine 02/13/2021, 2 weeks.

## 2021-02-27 NOTE — Discharge Instructions (Signed)
Take the Doxycycline twice daily with food for 10 days for treatment of your URI.   Use the Atrovent nasal spray, 2 squirts in each nostril every 6 hours, as needed for runny nose and postnasal drip.  Use the Tessalon Perles every 8 hours during the day.  Take them with a small sip of water.  They may give you some numbness to the base of your tongue or a metallic taste in your mouth, this is normal.  Use the Promethazine DM cough syrup at bedtime for cough and congestion.  It will make you drowsy so do not take it during the day.  Return for reevaluation or see your primary care provider for any new or worsening symptoms.

## 2021-03-16 ENCOUNTER — Other Ambulatory Visit: Payer: Self-pay

## 2021-03-16 ENCOUNTER — Ambulatory Visit
Admission: RE | Admit: 2021-03-16 | Discharge: 2021-03-16 | Disposition: A | Discharge status: Home / Self Care | Payer: PPO | Source: Ambulatory Visit | Attending: Urology | Admitting: Urology

## 2021-03-16 DIAGNOSIS — R972 Elevated prostate specific antigen [PSA]: Secondary | ICD-10-CM

## 2021-03-16 MED ORDER — GADOBENATE DIMEGLUMINE 529 MG/ML IV SOLN
20.0000 mL | Freq: Once | INTRAVENOUS | Status: AC | PRN
  Administered 2021-03-16: 20 mL via INTRAVENOUS

## 2021-04-21 ENCOUNTER — Encounter: Admit: 2021-04-21 | Discharge: 2021-04-21 | Payer: MEDICARE

## 2021-04-28 ENCOUNTER — Encounter: Admit: 2021-04-28 | Discharge: 2021-04-28 | Payer: MEDICARE

## 2021-04-28 NOTE — Progress Notes
H&E Slides of [facility] # [id number] W, Path Date 04/08/2021, requested on 04/28/2021 from [facility].    Contact for outside path lab is [phone].      FED EX shipping label faxed (fax # [phone]) with shipment tracking # [id number].

## 2021-05-01 ENCOUNTER — Encounter: Admit: 2021-05-01 | Discharge: 2021-05-01 | Payer: MEDICARE

## 2021-05-01 ENCOUNTER — Ambulatory Visit: Admit: 2021-05-01 | Discharge: 2021-05-01 | Payer: MEDICARE

## 2021-05-01 NOTE — Progress Notes
Bioimpedance Spectroscopy performed. Advised patient that Normal result will be sent within 24 hours by mail or via Mychart (preferred). The patient will be contacted via phone by the lymphedema nurse with any abnormal results.

## 2021-05-01 NOTE — Progress Notes
Lymphedema Prevention Bioimpedance Spectroscopy (BIS) Monitoring    BIS obtained for baseline measurements prior to surgery.    Baseline BIS = 1.9, RUE  No notification indicated     A BIS test (Bioimpedance Spectroscopy test) was done in coordination with your surgery appointment today for baseline lymphedema evaluation only. The BIS test is a scale used to help detect early signs of lymphedema. Your baseline measurement from today will be used to aid with lymphedema monitoring in the future.

## 2021-05-01 NOTE — Progress Notes
Date of Service: 05/01/2021    Clinical Care Team:  -Referring Provider for today's consult: Orma Render, MD  -Primary Care Provider: Orma Render    Chief Complaint:  Melanoma     History of Present Illness: Nathaniel Alvarez is a 76 y.o. male who was referred  for evaluation of a malignant melanoma, 1.7 mm in Breslow depth, Clark Level  IV,  crusted ulcerations, with 2 mitosis/mm2, located on his mid back diagnosed 04/08/2021. Pt was referred by his PCP for a mole on his back, his PCP excised it in clinic and closed the defect primarily however its been ulcerated and the sutures kept breaking down so he was referred to [place] for further management. Pt states the mole has been around for year and that it recently got snagged on a barbwire fence which prompted his PCP to resect it at a routine appointment.         Primary Melanoma Characteristics:  Melanoma subtype: superficial spreading  Radial growth phase: not specified   Vertical growth phase: not specified  Breslow Depth: 1.7  Clark Level: IV  Ulceration: crusted  Mitotic rate: 2/mm2  Angiolymphatic invasion: not identified  Perineural invasion: not identified  Satellitosis:not specified   Regression: not specified   TIL: not specified   Peripheral margins: not involved       copy of scanned OSH path report    Melanoma Risk Factor History  Previous hx of melanoma:  No   Hx of abnormal (dysplastic) moles:   No   Hx of sunburn:   Yes    Hx of tanning bed usage No   Family hx of Melanoma:  No   Family hx of abnormal (dysplastic) moles:  No   Occupation:  Outdoors     Past Medical History: He  has a past medical history of Arthritis and Diabetes mellitus (HCC).    Past Surgical History: He  has a past surgical history that includes hip replacement (Bilateral); skin biopsy; colonoscopy; and retinal detachment repair (N/A).    Medications: He @[facility] @    Allergies: No known drug allergies     Social History: He  reports that he quit smoking about 35 years ago. His smoking use included cigarettes. He has never used smokeless tobacco. He reports that he does not currently use alcohol. He reports that he does not use drugs.    Family History:   Denies any family hx of bleeding disorders    Review of Systems:   Review of Systems   Constitutional: Negative for appetite change, chills, fatigue and fever.   HENT: Negative for rhinorrhea and sore throat.    Eyes: Negative for visual disturbance.   Respiratory: Negative for shortness of breath.    Cardiovascular: Negative for chest pain.   Gastrointestinal: Negative for blood in stool, constipation, diarrhea and nausea.   Endocrine: Negative for polyuria.   Genitourinary: Negative for dysuria.   Musculoskeletal: Negative for myalgias.   Skin: Negative for color change.   Allergic/Immunologic: Negative for immunocompromised state.   Neurological: Negative for headaches.   Hematological: Negative for adenopathy.   Psychiatric/Behavioral: Negative for agitation, behavioral problems and confusion.       Physical Examination:  Vitals: BP (!) 176/80 (BP Source: Arm, Left Upper, Patient Position: Sitting)  - Pulse 64  - Temp 36.3 ?C (97.4 ?F) (Temporal)  - Resp 16  - Ht 189.6 cm (6' 2.65)  - Wt 125.5 kg (276 lb 9.6 oz)  - SpO2 98%  - BMI  34.90 kg/m?   Body mass index is 34.9 kg/m?Marland Kitchen  Physical Exam     Gen: A&Ox3, NAD  HEENT: Normocephalic, atraumatic.   Pulm: Unlabored on RA  CV: Regular rate   Abdomen: Soft  Torso: scabbed incision with sutures in place  Neuro: Motor strength and sensation grossly intact throughout, no focal deficits  Extremities: No cyanosis or edema  Skin: Warm and dry, no clinical LN  Psych: normal mood              .Assessment and Plan:  [name] is a 76 y.o. male with a newly diagnosed malignant melanoma, 1.15mm Breslow Depth, Clark Level IV, crusted ulceration, with 2  mitosis/mm2 involving the mid back. His biopsy slides have been requested and will be reviewed at our institution.       - Sutures removed in clinic  - Plan for WLE with 2cm margins and SLBx, possible FTSG  - The risks, benefits, and alternatives of the procedure were discussed including, but not limited to,  pain, bleeding, infection,  damage to nearby structures, lymphedema, poor wound healing, scarring, recurrence, inadequate excision, need for additional procedures and they elected to proceed with surgery  - All questions answered to the patient's satisfaction. Pt verbalizes understanding. Consent obtained and placed in chart.   - Timing TBD likely end of March vs early April     Sherre Poot, DO     Surgical Oncology Attending Attestation    I have seen this patient with the surgical resident and have edited their note as appropriate.. I have reviewed relevant imaging, pathology, and laboratory results as appropriate.      In brief, [name] is a 76 y.o. male who was referred by Orma Render [phone] for biopsy proven 1.44mm melanoma with possible ulceration on his mid back.    We had a long discussion about the surgical treatment of cutaneous melanoma, and about how for a melanoma of his depth treatment is primairly surgical, and would consist of a wide local excision with sentinel lymph node biopsy.  We will take 2cm margins.  He signed consent to be included in the melanoma survivorship registry.    Specific potential complications discussed including pain, numbness, hematoma, seroma, infection, bleeding, injury to surrounding structures, lymphedema, poor cosmetic outcome, the need for additional procedures, temporary or permanent organ failure, and a risk of death.  He asked multiple questions, all of which were answered to his apparent satisfaction. He was instructed to call the office if they had further questions.    He will be referred to our preoperative anesthesia clinic.    Informed consent was signed in the office.    Positioning for the procedure will be prone for the resection followed by supine for the SLNBx.  I have requested no long acting muscle relaxant.  He will have a preoperative lymphoscintigraphy.     I worked very hard to answer all of his questions today, and he understands that he can call the office at any time as more questions arise.    Evelena Asa MD MS  Assistant Professor of Surgery  Division of Colorectal and Oncologic Surgery  [facility]

## 2021-05-01 NOTE — Patient Instructions
[facility] [place] [facility]  Surgery Instructions    Surgery Date: 06/03/21      (Please be aware that this appointment for your surgery will not be visible in the MyChart portal.)    Location:  Your surgery will be held at [facility] ([address], [place], [place] [address].)  Where to park on the day of surgery: [employer] [address] (located just [place] [address] on [address])  Where to check in on the day of surgery: In the Admitting office (located on Level 1 of hospital of the [employer] [facility])      Arrival Time:   The OR scheduling office must finalize the OR schedule before we can provide a time for your surgery. Please do not call your physicians office about surgery arrival time. Your surgery start time and arrive times will be called out to you the day before your procedure between 2pm-4:30pm. If you have not heard from the hospital regarding your surgery time by 4:30PM the evening before your surgery, please call [phone].  The day of the procedure, if you cannot keep your appointment, or are delayed, please contact the surgery center immediately at [phone].      COVID-19 Testing:   No testing needed for planned surgery. If you develop any COVID-19 symptoms within 14 days of your procedure, please call [phone]. Ask to be connected to Geraldo Docker (Nurse for Dr. Festus Barren)..     Symptoms of COVID-19 may include any of the following:  Fever or chills, Cough, Shortness of breath or difficulty breathing, Fatigue, Muscle or body aches, Headache, New loss of taste or smell, Sore throat, Congestion or runny nose, Nausea or vomiting, Diarrhea    Eating and drinking:   Do not eat after 11PM the night before your surgery unless otherwise instructed. No gum, No candy or mints! Sips or small amounts of water and clear liquids may be consumed after 11pm, but must be stopped 2 hours before you need to arrive to the hospital the day of your surgery. Bowel Prep:   No Bowel Prep Required     Hygiene:    Shower the night before and the morning of your procedure , you will not need to perform your showers with any special soaps, just be sure you clean on the day of your surgery.    Personal Items:    Do not wear makeup, fingernail polish, lotion, deodorant, or perfume  Remove all metal jewelry and piercings.   Bring a Retail buyer for your glasses, contacts, hearing aids, dentures, etc.     Thing to Bring:    Personal identification (ID)   Insurance card    Medications:    You will be given medication instructions by the Pre-Operative Assessment Clinic [facility]). If you have NOT been scheduled a Pre-Operative Assessment appointment, you will receive a phone call to be ?triaged? by the anesthesia team. Please be sure to speak with the anesthesia team or attend your [facility] appointment if setup for you. If you do not, this may result in cancellation or delay of your surgery.  If you did not receive medication instructions, or have questions about any of your current medications, as it relates to the day of your surgery, please call [phone].    Transportation/Visitor Policy:    During the COVID-19 crisis, for the safety of our patients, the health system has limited visitors.  Please be aware of the following:  You are having an outpatient surgery, a responsible adult you know and trust is required to drive you home and stay with you for 12-24 hours after surgery. If you do not have this arranged prior to surgery, your surgery may be delayed or cancelled. Remember, you are not allowed to drive for 24 hours after surgery.  A maximum of 2 total visitors are allowed during your hospital admission each day.   During the time of your surgery, due to the size of the perioperative waiting room, only 1 visitor is allowed in the designated waiting room.  If you have 2 visitors during the time of your surgery, please know your visitors will be asked to find a place to wait in the hospital lobby or cafeteria and will be notified by the nurse liaison team, when you are out of surgery.  No visitors are allowed in pre/post patient care areas until a patient is transferred to a patient room.  If you would like more than 1 visitor to be at the hospital during surgery, please be aware you are only allowed   No visitors are allowed for patients with active COVID-19 infections.    All visitors must:     Wear a mask at all times until further notice.  Maintain physical distancing of at least six feet from others.  Wash hands or use hand sanitizer when entering or exiting patient rooms.   Any visitors who have a fever or other cold- or flu-like symptoms will not be allowed in [facility] [facility] facilities.     Thank you for understanding our visitation policies. We will continue to evaluate our recommendations and keep you updated on revisions based on public health guidance.       FMLA:   If you have any FMLA or Short Term Disability Paperwork needs, you may have these faxed to [phone]- Attention to Geraldo Docker, RN. Be sure your name and birthday are on the forms, along with a return fax number for this office to return them to once completed. Please allow up to 7-10 business days for completion of paperwork.     Billing Questions:   A member of the business office will verify your medical benefits with your insurance company prior to your surgery. If you have questions about understanding the cost of your care please call the Financial Customer Service at [phone].         If you have questions, please call [phone]. Ask to be connected to Geraldo Docker (Nurse for Dr. Festus Barren).    If your needs are URGENT and it?s after hours   please call [phone] and have the on call provider with Surgical Oncology paged.

## 2021-05-02 ENCOUNTER — Encounter: Admit: 2021-05-02 | Discharge: 2021-05-02 | Payer: MEDICARE

## 2021-05-15 ENCOUNTER — Encounter: Admit: 2021-05-15 | Discharge: 2021-05-15 | Payer: MEDICARE

## 2021-05-15 DIAGNOSIS — C4359 Malignant melanoma of other part of trunk: Secondary | ICD-10-CM

## 2021-05-20 DIAGNOSIS — G4733 Obstructive sleep apnea (adult) (pediatric): Secondary | ICD-10-CM | POA: Diagnosis not present

## 2021-06-03 ENCOUNTER — Encounter: Admit: 2021-06-03 | Discharge: 2021-06-03 | Payer: MEDICARE

## 2021-06-03 ENCOUNTER — Ambulatory Visit: Admit: 2021-06-03 | Discharge: 2021-06-03 | Payer: MEDICARE

## 2021-06-03 DIAGNOSIS — C4359 Malignant melanoma of other part of trunk: Secondary | ICD-10-CM

## 2021-06-03 DIAGNOSIS — E119 Type 2 diabetes mellitus without complications: Secondary | ICD-10-CM

## 2021-06-03 DIAGNOSIS — M1990 Unspecified osteoarthritis, unspecified site: Secondary | ICD-10-CM

## 2021-06-03 MED ORDER — ARTIFICIAL TEARS (PF) SINGLE DOSE DROPS GROUP
OPHTHALMIC | 0 refills | Status: DC
Start: 2021-06-03 — End: 2021-06-04
  Administered 2021-06-03: 22:00:00 2 [drp] via OPHTHALMIC

## 2021-06-03 MED ORDER — LIDOCAINE (PF) 200 MG/10 ML (2 %) IJ SYRG
INTRAVENOUS | 0 refills | Status: DC
Start: 2021-06-03 — End: 2021-06-04
  Administered 2021-06-03: 22:00:00 100 mg via INTRAVENOUS

## 2021-06-03 MED ORDER — PHENYLEPHRINE HCL IN 0.9% NACL 1 MG/10 ML (100 MCG/ML) IV SYRG
INTRAVENOUS | 0 refills | Status: DC
Start: 2021-06-03 — End: 2021-06-04
  Administered 2021-06-03: 23:00:00 100 ug via INTRAVENOUS
  Administered 2021-06-03: 23:00:00 200 ug via INTRAVENOUS
  Administered 2021-06-03: 23:00:00 150 ug via INTRAVENOUS

## 2021-06-03 MED ORDER — SUGAMMADEX 100 MG/ML IV SOLN
INTRAVENOUS | 0 refills | Status: DC
Start: 2021-06-03 — End: 2021-06-04
  Administered 2021-06-03: 250 mg via INTRAVENOUS

## 2021-06-03 MED ORDER — ONDANSETRON HCL (PF) 4 MG/2 ML IJ SOLN
INTRAVENOUS | 0 refills | Status: DC
Start: 2021-06-03 — End: 2021-06-04
  Administered 2021-06-03: 4 mg via INTRAVENOUS

## 2021-06-03 MED ORDER — ROCURONIUM 10 MG/ML IV SOLN
INTRAVENOUS | 0 refills | Status: DC
Start: 2021-06-03 — End: 2021-06-04
  Administered 2021-06-03: 22:00:00 50 mg via INTRAVENOUS

## 2021-06-03 MED ORDER — PROPOFOL INJ 10 MG/ML IV VIAL
INTRAVENOUS | 0 refills | Status: DC
Start: 2021-06-03 — End: 2021-06-04
  Administered 2021-06-03: 22:00:00 200 mg via INTRAVENOUS

## 2021-06-03 MED ORDER — CEFAZOLIN 1 GRAM IJ SOLR
INTRAVENOUS | 0 refills | Status: DC
Start: 2021-06-03 — End: 2021-06-04
  Administered 2021-06-03: 22:00:00 3 g via INTRAVENOUS

## 2021-06-03 MED ORDER — DEXAMETHASONE SODIUM PHOSPHATE 4 MG/ML IJ SOLN
INTRAVENOUS | 0 refills | Status: DC
Start: 2021-06-03 — End: 2021-06-04
  Administered 2021-06-03: 22:00:00 4 mg via INTRAVENOUS

## 2021-06-03 MED ORDER — RP DX TC-99M SULF COLL MCI
1 | Freq: Once | INTRAVENOUS | 0 refills | Status: CP
Start: 2021-06-03 — End: ?
  Administered 2021-06-03: 18:00:00 1 via INTRAVENOUS

## 2021-06-03 MED ORDER — FENTANYL CITRATE (PF) 50 MCG/ML IJ SOLN
INTRAVENOUS | 0 refills | Status: DC
Start: 2021-06-03 — End: 2021-06-04
  Administered 2021-06-03: 22:00:00 100 ug via INTRAVENOUS
  Administered 2021-06-03: 23:00:00 50 ug via INTRAVENOUS

## 2021-06-03 MED ADMIN — LIDOCAINE (PF) 5 MG/ML (0.5 %) IJ SOLN [95841]: 7.5 mL | SUBCUTANEOUS | @ 23:00:00 | Stop: 2021-06-04 | NDC 00409427816

## 2021-06-03 MED ADMIN — LACTATED RINGERS IV SOLP [4318]: 1000 mL | INTRAVENOUS | @ 20:00:00 | Stop: 2021-06-03 | NDC 00338011704

## 2021-06-03 MED ADMIN — INDOCYANINE GREEN 25 MG IJ SOLR [82549]: 0.2 mL | SUBCUTANEOUS | @ 23:00:00 | Stop: 2021-06-04 | NDC 17478070125

## 2021-06-03 MED ADMIN — BUPIVACAINE HCL 0.25 % (2.5 MG/ML) IJ SOLN [1222]: 1.5 mL | SUBCUTANEOUS | @ 23:00:00 | Stop: 2021-06-04 | NDC 63323046501

## 2021-06-03 MED ADMIN — LIDOCAINE (PF) 5 MG/ML (0.5 %) IJ SOLN [95841]: 1.5 mL | SUBCUTANEOUS | @ 23:00:00 | Stop: 2021-06-04 | NDC 00409427816

## 2021-06-03 MED ADMIN — BUPIVACAINE HCL 0.25 % (2.5 MG/ML) IJ SOLN [1222]: 7.5 mL | SUBCUTANEOUS | @ 23:00:00 | Stop: 2021-06-04 | NDC 63323046501

## 2021-06-05 ENCOUNTER — Encounter: Admit: 2021-06-05 | Discharge: 2021-06-05 | Payer: MEDICARE

## 2021-06-05 DIAGNOSIS — E119 Type 2 diabetes mellitus without complications: Secondary | ICD-10-CM

## 2021-06-05 DIAGNOSIS — M1990 Unspecified osteoarthritis, unspecified site: Secondary | ICD-10-CM

## 2021-06-07 ENCOUNTER — Ambulatory Visit (INDEPENDENT_AMBULATORY_CARE_PROVIDER_SITE_OTHER): Payer: PPO

## 2021-06-07 ENCOUNTER — Other Ambulatory Visit: Payer: Self-pay

## 2021-06-07 ENCOUNTER — Encounter: Payer: Self-pay | Admitting: Emergency Medicine

## 2021-06-07 ENCOUNTER — Ambulatory Visit
Admission: EM | Admit: 2021-06-07 | Discharge: 2021-06-07 | Disposition: A | Discharge status: Home / Self Care | Payer: PPO | Attending: Internal Medicine | Admitting: Internal Medicine

## 2021-06-07 DIAGNOSIS — J189 Pneumonia, unspecified organism: Secondary | ICD-10-CM | POA: Diagnosis not present

## 2021-06-07 DIAGNOSIS — U071 COVID-19: Secondary | ICD-10-CM | POA: Insufficient documentation

## 2021-06-07 DIAGNOSIS — R0989 Other specified symptoms and signs involving the circulatory and respiratory systems: Secondary | ICD-10-CM | POA: Diagnosis not present

## 2021-06-07 DIAGNOSIS — R051 Acute cough: Secondary | ICD-10-CM | POA: Diagnosis not present

## 2021-06-07 DIAGNOSIS — R059 Cough, unspecified: Secondary | ICD-10-CM

## 2021-06-07 DIAGNOSIS — R5383 Other fatigue: Secondary | ICD-10-CM

## 2021-06-07 DIAGNOSIS — R509 Fever, unspecified: Secondary | ICD-10-CM | POA: Diagnosis not present

## 2021-06-07 LAB — RESP PANEL BY RT-PCR (FLU A&B, COVID) ARPGX2
Influenza A by PCR: NEGATIVE
Influenza B by PCR: NEGATIVE
SARS Coronavirus 2 by RT PCR: POSITIVE — AB

## 2021-06-07 MED ORDER — MOLNUPIRAVIR 200 MG PO CAPS: 4.0000 | ORAL_CAPSULE | Freq: Two times a day (BID) | ORAL | 0 refills | Status: AC

## 2021-06-07 NOTE — Discharge Instructions (Addendum)
-  Your chest x-ray looks good.  No evidence of pneumonia. ?- You did test positive for COVID-19 see need to isolate until Tuesday and then wear a mask for 5 days.  I have sent COVID antiviral medicine to the pharmacy. ?- Increase rest and fluid intake and start taking Mucinex.  You can take something like Mucinex DM or NyQuil at night. ?-Most people start to feel better within a week or 2 after having COVID-19.  If you start to have any uncontrollable fever, weakness or breathing difficulty need to go to the ER. ?

## 2021-06-07 NOTE — ED Provider Notes (Signed)
?[facility] ? ? ? ?CSN: [id number] ?Arrival date & time: 06/07/21  7096 ? ? ?  ? ?History   ?Chief Complaint ?Chief Complaint  ?Patient presents with  ? Sinus Problem  ? Cough  ? ? ?HPI ?Andrew Myers is a 76 y.o. male presenting for 3-day history of nasal congestion, cough, chest congestion.  Also reports low-grade fever up to 99.9 degrees.  He reports headache and sinus pressure as well.  Patient says he has a history of pneumonia with a secondary empyema and he does not want that to happen again so he thought he would try to catch it early and get on something right away.  He denies any sick contacts or known exposure to COVID-19.  Medical history also significant for hypertension, hyperlipidemia and sleep apnea.  Patient taking OTC meds for his symptoms.  No other complaints. ? ?HPI ? ?Past Medical History:  ?Diagnosis Date  ? Allergic rhinitis   ? Arthritis   ? BPH (benign prostatic hyperplasia)   ? CAP (community acquired pneumonia) 04/2019  ? Empyema ([place])   ? Empyema lung ([place]) 06/2019  ? Family history of adverse reaction to anesthesia   ? Sister with nausea and vomiting  ? GERD (gastroesophageal reflux disease)   ? Hiatal hernia   ? History of adenomatous polyp of colon   ? History of chickenpox   ? History of kidney stones   ? History of wrist fracture   ? Hyperlipidemia   ? Hypertension   ? Obesity   ? Pleural effusion on right 05/2019  ? Pre-diabetes   ? Skin cancer   ? BCC, SCC  ? Sleep apnea   ? Uses Bi-Pap  ? Sleep apnea   ? ? ?Patient Active Problem List  ? Diagnosis Date Noted  ? Total knee replacement status 11/04/2020  ? Status post total right knee replacement 05/06/2020  ? Primary osteoarthritis of left knee 12/17/2019  ? Primary osteoarthritis of right knee 12/17/2019  ? Neck pain 07/18/2019  ? Empyema ([place]) 07/18/2019  ? Joint pain 07/18/2019  ? Hypokalemia 07/05/2019  ? Hyperglycemia 07/05/2019  ? Empyema of right pleural space ([place]) 07/05/2019  ? Hypertension   ?  Hyperlipidemia   ? BPH (benign prostatic hyperplasia)   ? Obesity   ? Sleep apnea   ? Benign prostatic hyperplasia (BPH) with urinary urgency 05/02/2019  ? Elevated PSA 10/27/2018  ? Type 2 diabetes mellitus without complication, without long-term current use of insulin ([place]) 10/27/2018  ? Benign essential hypertension 09/11/2013  ? GERD (gastroesophageal reflux disease) 09/11/2013  ? ? ?Past Surgical History:  ?Procedure Laterality Date  ? APPENDECTOMY    ? CATARACT EXTRACTION Bilateral 2019  ? COLONOSCOPY N/A 01/09/2020  ? Procedure: COLONOSCOPY;  Surgeon: Lesly Rubenstein, MD;  Location: [facility] ENDOSCOPY;  Service: Endoscopy;  Laterality: N/A;  ? EMPYEMA DRAINAGE    ? EYE SURGERY    ? Laser surgery 2020  ? FINE NEEDLE ASPIRATION BIOPSY  04/16/2020  ? ISTHMUS THYROID AND MIDDLE LEFT THYROID -BOTH BENIGN  ? FRACTURE SURGERY  2003  ? repair 5th metacarpal fracture   ? JOINT REPLACEMENT    ? KNEE ARTHROPLASTY Right 05/06/2020  ? Procedure: COMPUTER ASSISTED TOTAL KNEE ARTHROPLASTY;  Surgeon: Dereck Leep, MD;  Location: [facility] ORS;  Service: Orthopedics;  Laterality: Right;  ? KNEE ARTHROPLASTY Left 11/04/2020  ? Procedure: COMPUTER ASSISTED TOTAL KNEE ARTHROPLASTY;  Surgeon: Dereck Leep, MD;  Location: [facility] ORS;  Service: Orthopedics;  Laterality: Left;  ? LUMBAR PUNCTURE    ? PROSTATE BIOPSY  2016  ? ? ? ? ? ?Home Medications   ? ?Prior to Admission medications   ?Medication Sig Start Date End Date Taking? Authorizing Provider  ?atorvastatin (LIPITOR) 20 MG tablet Take 20 mg by mouth at bedtime.    Yes [provider]  ?calcium carbonate (TUMS EX) 750 MG chewable tablet Chew 1 tablet by mouth daily as needed for heartburn.   Yes [provider]  ?cetirizine (ZYRTEC) 10 MG tablet Take 10 mg by mouth in the morning.   Yes [provider]  ?Cholecalciferol (VITAMIN D3) 50 MCG (2000 UT) TABS Take 2,000 Units by mouth in the morning.   Yes [provider]  ?hydrochlorothiazide  (HYDRODIURIL) 50 MG tablet Take 25 mg by mouth in the morning. 07/29/20  Yes [provider]  ?ipratropium (ATROVENT) 0.06 % nasal spray Place 2 sprays into both nostrils 4 (four) times daily. 02/27/21  Yes Margarette [place], NP  ?molnupiravir EUA (LAGEVRIO) 200 MG CAPS capsule Take 4 capsules (800 mg total) by mouth 2 (two) times daily for 5 days. 06/07/21 06/12/21 Yes Laurene Footman B, PA-C  ?nebivolol (BYSTOLIC) 5 MG tablet Take 5 mg by mouth in the morning.   Yes [provider]  ?omeprazole (PRILOSEC) 20 MG capsule Take 20 mg by mouth in the morning.   Yes [provider]  ?tamsulosin (FLOMAX) 0.4 MG CAPS capsule Take 0.8 mg by mouth at bedtime.   Yes [provider]  ?valsartan (DIOVAN) 160 MG tablet Take 160 mg by mouth in the morning. 10/27/18  Yes [provider]  ?acetaminophen (TYLENOL) 500 MG tablet Take 500 mg by mouth every 6 (six) hours as needed (pain.).    [provider]  ?Ascorbic Acid (VITAMIN C) 1000 MG tablet Take 1,000 mg by mouth in the morning and at bedtime.    [provider]  ?Bacillus Coagulans-Inulin (PROBIOTIC-PREBIOTIC PO) Take 1 capsule by mouth in the morning.    [provider]  ?benzonatate (TESSALON) 100 MG capsule Take 2 capsules (200 mg total) by mouth every 8 (eight) hours. 02/27/21   Margarette Canada, NP  ?bisacodyl (DULCOLAX) 5 MG EC tablet Take 5-15 mg by mouth daily as needed for moderate constipation.    [provider]  ?[facility] Liver Oil 1250-130 units CAPS Take 1 capsule by mouth in the morning.    [provider]  ?Digestive Enzymes (PAPAYA AND ENZYMES PO) Take 4 capsules by mouth daily as needed (digestive health).    [provider]  ?diphenhydrAMINE (BENADRYL) 25 mg capsule Take 25 mg by mouth every 6 (six) hours as needed for itching.    [provider]  ?docusate sodium (COLACE) 100 MG capsule Take 100-300 mg by mouth daily as needed for mild constipation.    [provider]  ?doxycycline (VIBRAMYCIN) 100 MG capsule Take 1 capsule (100 mg total) by mouth 2 (two) times daily. 02/27/21   Margarette Canada, NP  ?fluticasone (FLONASE) 50 MCG/ACT nasal spray Place 1 spray into both nostrils daily as needed for allergies.    [provider]  ?Homeopathic Products ([employer] LEG CRAMPS PAIN RELIEF) TABS Take 1-2 tablets by mouth every 4 (four) hours as needed (leg cramps).    [provider]  ?Homeopathic Products (EARACHE RELIEF OT) Place 2 drops in ear(s) daily as needed (earache).    [provider]  ?Homeopathic Products (ZICAM COLD REMEDY PO) Take 1 tablet by  mouth every 4 (four) hours as needed. COLD SYMPTOMS    [provider]  ?L-ARGININE PO Take 1 capsule by mouth in the morning and at bedtime. With  L-citrulline    [provider]  ?loperamide (IMODIUM) 2 MG capsule Take 2-4 mg by mouth 4 (four) times daily as needed for diarrhea or loose stools.    [provider]  ?Magnesium Salicylate Tetrahyd (PERCOGESIC BACKACHE RELIEF) 580 MG TABS Take 2 tablets by mouth every 6 (six) hours as needed (back pain).    [provider]  ?melatonin 5 MG TABS Take 5 mg by mouth at bedtime as needed (sleep).    [provider]  ?miconazole (MICOTIN) 2 % powder Apply 1 application topically 2 (two) times daily as needed for itching (jock itch).    [provider]  ?Multiple Vitamin (MULTIVITAMIN WITH MINERALS) TABS tablet Take 1 tablet by mouth in the morning.    [provider]  ?neomycin-bacitracin-polymyxin (NEOSPORIN) [phone] ointment Apply 1 application topically 4 (four) times daily as needed (cuts/scrapes/wound care).    [provider]  ?Omega-3 Fatty Acids (FISH OIL) 1200 MG CAPS Take 1,200 mg by mouth in the morning and at bedtime.    [provider]  ?OVER THE COUNTER MEDICATION Take 1,200 mg by mouth in the morning and at bedtime. BEET ROOT CAPSULES    [provider]  ?[place]    [provider]  ?PHOSPHATIDYLSERINE PO Take 400 mg by mouth in the morning and at bedtime.    [provider]  ?Polyethyl Glycol-Propyl Glycol (

## 2021-06-07 NOTE — ED Triage Notes (Signed)
Patient c/o sinus congestion and pressure and cough, and chest congestion that on Wed.  Patient denies fevers.   ?

## 2021-06-19 ENCOUNTER — Encounter: Admit: 2021-06-19 | Discharge: 2021-06-19 | Payer: MEDICARE

## 2021-06-19 DIAGNOSIS — M1990 Unspecified osteoarthritis, unspecified site: Secondary | ICD-10-CM

## 2021-06-19 DIAGNOSIS — E119 Type 2 diabetes mellitus without complications: Secondary | ICD-10-CM

## 2021-06-19 NOTE — Patient Instructions
[facility]  PHONE    Phone:  [phone]    FAX:  [phone]    [place] [address]    HOURS    Monday to Fri: 8am to 4:30pm

## 2021-07-15 DIAGNOSIS — E119 Type 2 diabetes mellitus without complications: Secondary | ICD-10-CM | POA: Diagnosis not present

## 2021-07-15 DIAGNOSIS — Z79899 Other long term (current) drug therapy: Secondary | ICD-10-CM | POA: Diagnosis not present

## 2021-07-15 DIAGNOSIS — E7800 Pure hypercholesterolemia, unspecified: Secondary | ICD-10-CM | POA: Diagnosis not present

## 2021-07-22 DIAGNOSIS — E1165 Type 2 diabetes mellitus with hyperglycemia: Secondary | ICD-10-CM | POA: Diagnosis not present

## 2021-07-22 DIAGNOSIS — E7800 Pure hypercholesterolemia, unspecified: Secondary | ICD-10-CM | POA: Diagnosis not present

## 2021-07-22 DIAGNOSIS — Z79899 Other long term (current) drug therapy: Secondary | ICD-10-CM | POA: Diagnosis not present

## 2021-07-22 DIAGNOSIS — I152 Hypertension secondary to endocrine disorders: Secondary | ICD-10-CM | POA: Diagnosis not present

## 2021-07-22 DIAGNOSIS — E1159 Type 2 diabetes mellitus with other circulatory complications: Secondary | ICD-10-CM | POA: Diagnosis not present

## 2021-07-24 DIAGNOSIS — E042 Nontoxic multinodular goiter: Secondary | ICD-10-CM | POA: Diagnosis not present

## 2021-07-31 DIAGNOSIS — E119 Type 2 diabetes mellitus without complications: Secondary | ICD-10-CM | POA: Diagnosis not present

## 2021-07-31 DIAGNOSIS — E042 Nontoxic multinodular goiter: Secondary | ICD-10-CM | POA: Diagnosis not present

## 2021-08-19 DIAGNOSIS — G4733 Obstructive sleep apnea (adult) (pediatric): Secondary | ICD-10-CM | POA: Diagnosis not present

## 2021-08-19 IMAGING — CT CT IMAGE GUIDED DRAINAGE BY PERCUTANEOUS CATHETER
2 of 4 series · 6 of 14 positions shown, 7 images · non-contrast
Comparison: none

INDICATION: 73-year-old with a loculated right pleural effusion. Patient already
has one right chest tube but there is residual right pleural fluid
and request for a second chest tube placement.
TECHNIQUE: Informed written consent was obtained from the patient after a
thorough discussion of the procedural risks, benefits and
alternatives. All questions were addressed. A timeout was performed
prior to the initiation of the procedure.

[Series 3: i-spiral 5.0 b30f · axial · 0.73mm/px · z∈[-63,+17]mm · 4 of 28 slices shown, 5 images]
[im 6/28  soft-tissue]
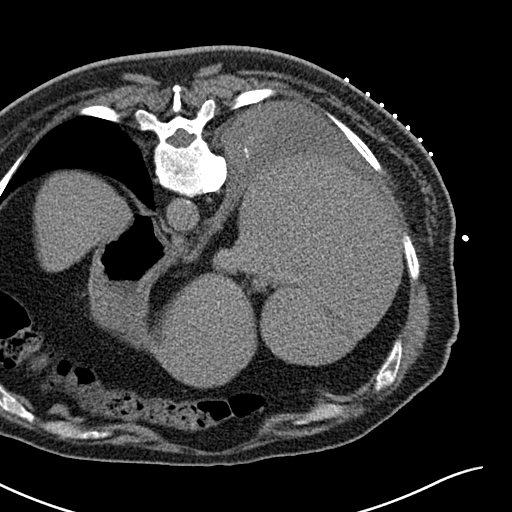
[im 6/28  bone]
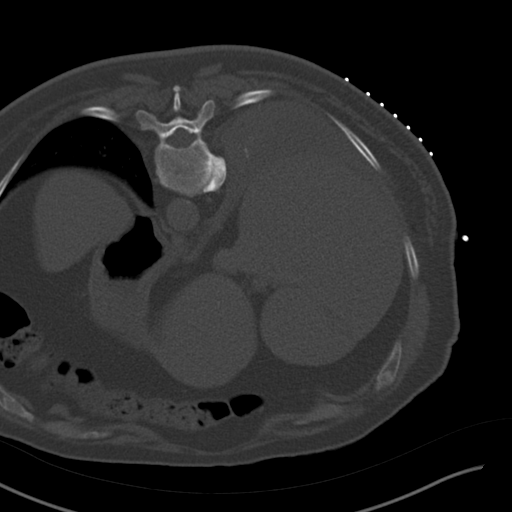
[im 11/28  bone]
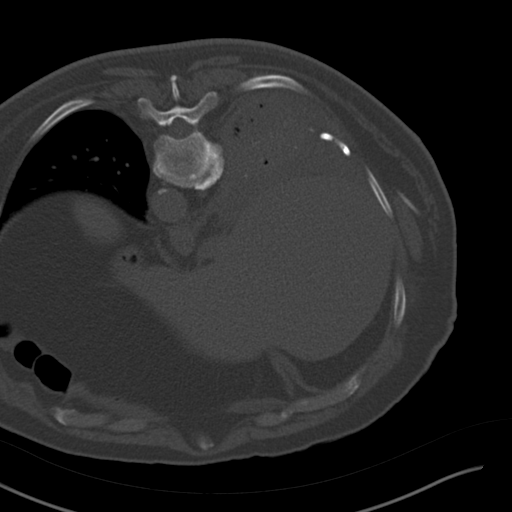
[im 17/28  bone]
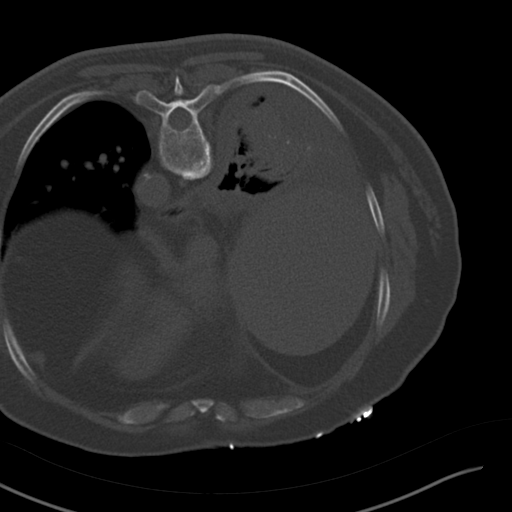
[im 22/28  bone]
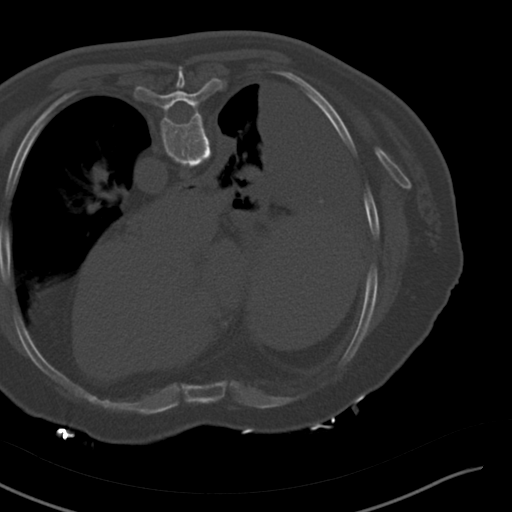

[Series 4: i-sequence 4.8 b30s · axial · 0.73mm/px · z∈[+6,+11]mm · 2 of 21 slices shown]
[im 7/21  bone]
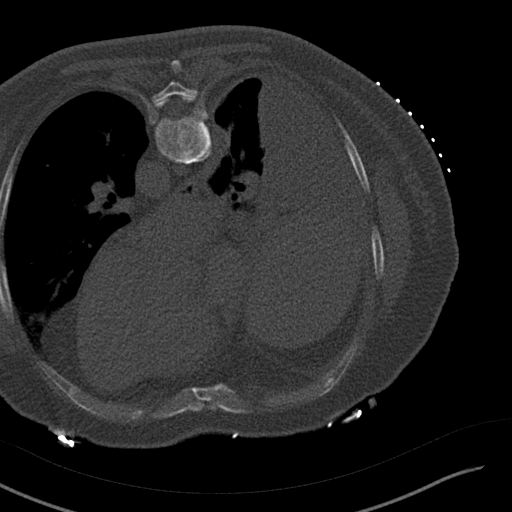
[im 14/21  bone]
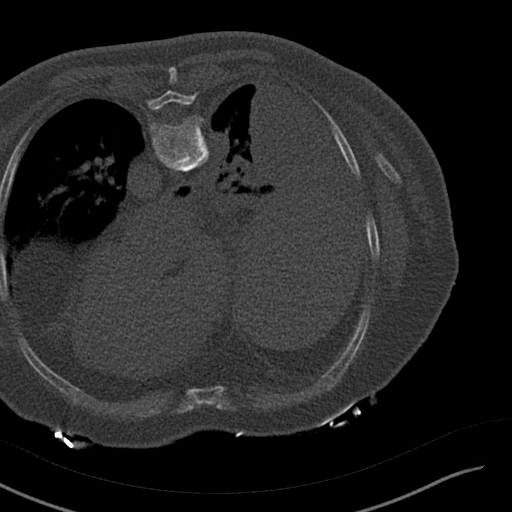

[6 of 14 positions shown; findings below may reference images not displayed]

EXAM:
CT-GUIDED RIGHT CHEST TUBE PLACEMENT

MEDICATIONS:
Moderate sedation

ANESTHESIA/SEDATION:
1.0 mg IV Versed 50 mcg IV Fentanyl

Moderate Sedation Time:  18 minutes

The patient was continuously monitored during the procedure by the
interventional radiology nurse under my direct supervision.

COMPLICATIONS:
None immediate.
PROCEDURE:
Patient was placed prone on the CT scanner. Pocket of fluid along
the posterior aspect of the right chest was identified and targeted.
The right posterior chest was prepped with chlorhexidine and sterile
field was created. Skin and soft tissues were anesthetized with 1%
lidocaine. Using CT guidance, a Yueh catheter was directed into the
pleural space and yellow fluid was aspirated. Superstiff Amplatz
wire was advanced into the pleural space. The tract was dilated to
accommodate a 14 French multipurpose drain. Small amount of
additional pleural fluid was aspirated. Chest tube was attached to
the drainage system. Catheter was sutured to skin and a dressing was
placed.
FINDINGS: Loculated right pleural fluid. Pocket along the right posterior
aspect of the chest was identified and targeted for drain placement.
14 French drain was successfully placed in the right pleural space.
Small amount of yellow fluid was removed. Pocket of pleural air
noted following chest tube placement compatible with the loculated
nature of the pleural effusion.
IMPRESSION: CT-guided placement of a right chest tube. Right pleural fluid is
loculated.

## 2021-08-25 ENCOUNTER — Encounter: Admit: 2021-08-25 | Discharge: 2021-08-25 | Payer: MEDICARE

## 2021-10-30 DIAGNOSIS — Z96653 Presence of artificial knee joint, bilateral: Secondary | ICD-10-CM | POA: Diagnosis not present

## 2021-11-04 ENCOUNTER — Encounter: Admit: 2021-11-04 | Discharge: 2021-11-04 | Payer: MEDICARE

## 2021-11-14 DIAGNOSIS — E1165 Type 2 diabetes mellitus with hyperglycemia: Secondary | ICD-10-CM | POA: Diagnosis not present

## 2021-11-14 DIAGNOSIS — E7801 Familial hypercholesterolemia: Secondary | ICD-10-CM | POA: Diagnosis not present

## 2021-11-17 DIAGNOSIS — G4733 Obstructive sleep apnea (adult) (pediatric): Secondary | ICD-10-CM | POA: Diagnosis not present

## 2021-11-21 DIAGNOSIS — G4733 Obstructive sleep apnea (adult) (pediatric): Secondary | ICD-10-CM | POA: Diagnosis not present

## 2021-11-21 DIAGNOSIS — E1159 Type 2 diabetes mellitus with other circulatory complications: Secondary | ICD-10-CM | POA: Diagnosis not present

## 2021-11-21 DIAGNOSIS — Z9989 Dependence on other enabling machines and devices: Secondary | ICD-10-CM | POA: Diagnosis not present

## 2021-11-21 DIAGNOSIS — Z125 Encounter for screening for malignant neoplasm of prostate: Secondary | ICD-10-CM | POA: Diagnosis not present

## 2021-11-21 DIAGNOSIS — Z79899 Other long term (current) drug therapy: Secondary | ICD-10-CM | POA: Diagnosis not present

## 2021-11-21 DIAGNOSIS — E7800 Pure hypercholesterolemia, unspecified: Secondary | ICD-10-CM | POA: Diagnosis not present

## 2021-11-21 DIAGNOSIS — I152 Hypertension secondary to endocrine disorders: Secondary | ICD-10-CM | POA: Diagnosis not present

## 2021-12-29 DIAGNOSIS — R972 Elevated prostate specific antigen [PSA]: Secondary | ICD-10-CM | POA: Diagnosis not present

## 2022-01-20 ENCOUNTER — Telehealth: Payer: Self-pay | Admitting: *Deleted

## 2022-01-20 NOTE — Patient Outreach (Signed)
  Care Coordination   Initial Visit Note   01/20/2022 Name: Andrew Myers MRN: [id number] DOB: [birth year]  Andrew Myers is a 76 y.o. year old male who sees Andrew Late, MD for primary care. I spoke with  Andrew Myers by phone today.  What matters to the patients health and wellness today?  Currently have cold, Covid test was negative.  Does not need ongoing follow up at this time but he will call if has concerns.     Goals Addressed             This Visit's Progress    COMPLETED: Effective DM management       Care Coordination Interventions: Provided education to patient about basic DM disease process Reviewed medications with patient and discussed importance of medication adherence Counseled on importance of regular laboratory monitoring as prescribed Reviewed scheduled/upcoming provider appointments including: PCP visit next month Advised patient, providing education and rationale, to check cbg daily and record, calling provider for findings outside established parameters Reviewed A1C was 6.9 in September, currently at goal         SDOH assessments and interventions completed:  Yes  SDOH Interventions Today    Flowsheet Row Most Recent Value  SDOH Interventions   Food Insecurity Interventions Intervention Not Indicated  Housing Interventions Intervention Not Indicated  Transportation Interventions Intervention Not Indicated  Utilities Interventions Intervention Not Indicated        Care Coordination Interventions:  Yes, provided   Follow up plan: No further intervention required.   Encounter Outcome:  Pt. Visit Completed   Andrew David, RN, MSN, [place] Care Management Care Management Coordinator [phone]

## 2022-01-20 NOTE — Patient Instructions (Signed)
Visit Information  Thank you for taking time to visit with me today. Please don't hesitate to contact me if I can be of assistance to you.  Following are the goals we discussed today:  Monitor and record blood sugar readings daily  Please call the Suicide and Crisis Lifeline: 988 call the [place] National Suicide Prevention Lifeline: 229-596-1118 or TTY: 769-112-7062 TTY (503)604-7066) to talk to a trained counselor call 1-800-273-TALK (toll free, 24 hour hotline) call 911 if you are experiencing a Mental Health or [place] or need someone to talk to.  Patient verbalizes understanding of instructions and care plan provided today and agrees to view in [place]. Active MyChart status and patient understanding of how to access instructions and care plan via MyChart confirmed with patient.     The patient has been provided with contact information for the care management team and has been advised to call with any health related questions or concerns.   Valente David, RN, MSN, [place] Care Management Care Management Coordinator [phone]

## 2022-02-05 DIAGNOSIS — L570 Actinic keratosis: Secondary | ICD-10-CM | POA: Diagnosis not present

## 2022-02-05 DIAGNOSIS — Z86018 Personal history of other benign neoplasm: Secondary | ICD-10-CM | POA: Diagnosis not present

## 2022-02-05 DIAGNOSIS — Z872 Personal history of diseases of the skin and subcutaneous tissue: Secondary | ICD-10-CM | POA: Diagnosis not present

## 2022-02-05 DIAGNOSIS — D485 Neoplasm of uncertain behavior of skin: Secondary | ICD-10-CM | POA: Diagnosis not present

## 2022-02-05 DIAGNOSIS — L578 Other skin changes due to chronic exposure to nonionizing radiation: Secondary | ICD-10-CM | POA: Diagnosis not present

## 2022-02-05 DIAGNOSIS — L821 Other seborrheic keratosis: Secondary | ICD-10-CM | POA: Diagnosis not present

## 2022-02-05 DIAGNOSIS — Z85828 Personal history of other malignant neoplasm of skin: Secondary | ICD-10-CM | POA: Diagnosis not present

## 2022-02-19 DIAGNOSIS — G4733 Obstructive sleep apnea (adult) (pediatric): Secondary | ICD-10-CM | POA: Diagnosis not present

## 2022-03-03 DIAGNOSIS — R350 Frequency of micturition: Secondary | ICD-10-CM | POA: Diagnosis not present

## 2022-03-03 DIAGNOSIS — N401 Enlarged prostate with lower urinary tract symptoms: Secondary | ICD-10-CM | POA: Diagnosis not present

## 2022-03-03 DIAGNOSIS — R972 Elevated prostate specific antigen [PSA]: Secondary | ICD-10-CM | POA: Diagnosis not present

## 2022-03-16 DIAGNOSIS — L570 Actinic keratosis: Secondary | ICD-10-CM | POA: Diagnosis not present

## 2022-05-21 ENCOUNTER — Ambulatory Visit
Admission: EM | Admit: 2022-05-21 | Discharge: 2022-05-21 | Disposition: A | Discharge status: Home / Self Care | Payer: PPO | Attending: Emergency Medicine | Admitting: Emergency Medicine

## 2022-05-21 ENCOUNTER — Ambulatory Visit (INDEPENDENT_AMBULATORY_CARE_PROVIDER_SITE_OTHER): Payer: PPO

## 2022-05-21 DIAGNOSIS — R600 Localized edema: Secondary | ICD-10-CM | POA: Diagnosis not present

## 2022-05-21 DIAGNOSIS — M19022 Primary osteoarthritis, left elbow: Secondary | ICD-10-CM

## 2022-05-21 MED ORDER — MELOXICAM 15 MG PO TABS: 15.0000 mg | ORAL_TABLET | Freq: Every day | ORAL | 0 refills | Status: AC

## 2022-05-21 NOTE — Discharge Instructions (Addendum)
Your x-ray did not show any broken bones but it did show significant arthritis of your elbow as well as a small amount of fluid.  I believe that the heavy lifting has inflamed your arthritis and that is what is causing the pain and limited motion in your elbow joint.  Take the meloxicam once daily with food to help with pain and inflammation.  Take this for the next week to 10 days.  You may apply moist heat to your left elbow for 20 minutes at a time 2-3 times a day to aid in pain relief and also help with joint mobility.  Follow the range of motion exercises given your discharge instructions.  If your symptoms do not improve, or they worsen, either return for reevaluation or follow-up with orthopedics.

## 2022-05-21 NOTE — ED Triage Notes (Signed)
Pt c/o L elbow pain x2 days, states he was trying to put back a tray of spices back on a top shelf which was heavy & all of a sudden felt a sharp pain in his elbow.

## 2022-05-21 NOTE — ED Provider Notes (Signed)
[facility]    CSN: [id number] Arrival date & time: 05/21/22  1055      History   Chief Complaint Chief Complaint  Patient presents with   Elbow Pain    HPI Andrew Myers is a 77 y.o. male.   HPI  77 year old male with a past medical history significant for hyperlipidemia, emphysema, arthritis, prediabetes, and GERD presents for evaluation of 2 days of left elbow pain.  He reports that he was trying to put a tray of spices back on the top shelf and felt a sharp pain in his elbow  Past Medical History:  Diagnosis Date   Allergic rhinitis    Arthritis    BPH (benign prostatic hyperplasia)    CAP (community acquired pneumonia) 04/2019   Empyema ([place])    Empyema lung ([place]) 06/2019   Family history of adverse reaction to anesthesia    Sister with nausea and vomiting   GERD (gastroesophageal reflux disease)    Hiatal hernia    History of adenomatous polyp of colon    History of chickenpox    History of kidney stones    History of wrist fracture    Hyperlipidemia    Hypertension    Obesity    Pleural effusion on right 05/2019   Pre-diabetes    Skin cancer    BCC, SCC   Sleep apnea    Uses Bi-Pap   Sleep apnea     Patient Active Problem List   Diagnosis Date Noted   Total knee replacement status 11/04/2020   Status post total right knee replacement 05/06/2020   Primary osteoarthritis of left knee 12/17/2019   Primary osteoarthritis of right knee 12/17/2019   Neck pain 07/18/2019   Empyema ([place]) 07/18/2019   Joint pain 07/18/2019   Hypokalemia 07/05/2019   Hyperglycemia 07/05/2019   Empyema of right pleural space ([place]) 07/05/2019   Hypertension    Hyperlipidemia    BPH (benign prostatic hyperplasia)    Obesity    Sleep apnea    Benign prostatic hyperplasia (BPH) with urinary urgency 05/02/2019   Elevated PSA 10/27/2018   Type 2 diabetes mellitus without complication, without long-term current use of insulin ([place]) 10/27/2018   Benign  essential hypertension 09/11/2013   GERD (gastroesophageal reflux disease) 09/11/2013    Past Surgical History:  Procedure Laterality Date   APPENDECTOMY     CATARACT EXTRACTION Bilateral 2019   COLONOSCOPY N/A 01/09/2020   Procedure: COLONOSCOPY;  Surgeon: Lesly Rubenstein, MD;  Location: [facility];  Service: Endoscopy;  Laterality: N/A;   EMPYEMA DRAINAGE     EYE SURGERY     Laser surgery 2020   FINE NEEDLE ASPIRATION BIOPSY  04/16/2020   ISTHMUS THYROID AND MIDDLE LEFT THYROID -BOTH BENIGN   FRACTURE SURGERY  2003   repair 5th metacarpal fracture    JOINT REPLACEMENT     KNEE ARTHROPLASTY Right 05/06/2020   Procedure: COMPUTER ASSISTED TOTAL KNEE ARTHROPLASTY;  Surgeon: Dereck Leep, MD;  Location: [facility] ORS;  Service: Orthopedics;  Laterality: Right;   KNEE ARTHROPLASTY Left 11/04/2020   Procedure: COMPUTER ASSISTED TOTAL KNEE ARTHROPLASTY;  Surgeon: Dereck Leep, MD;  Location: [facility] ORS;  Service: Orthopedics;  Laterality: Left;   LUMBAR PUNCTURE     PROSTATE BIOPSY  2016       Home Medications    Prior to Admission medications   Medication Sig Start Date End Date Taking? Authorizing Provider  acetaminophen (TYLENOL) 500 MG tablet Take 500 mg by  mouth every 6 (six) hours as needed (pain.).   Yes [provider]  Ascorbic Acid (VITAMIN C) 1000 MG tablet Take 1,000 mg by mouth in the morning and at bedtime.   Yes [provider]  atorvastatin (LIPITOR) 20 MG tablet Take 20 mg by mouth at bedtime.    Yes [provider]  Bacillus Coagulans-Inulin (PROBIOTIC-PREBIOTIC PO) Take 1 capsule by mouth in the morning.   Yes [provider]  benzonatate (TESSALON) 100 MG capsule Take 2 capsules (200 mg total) by mouth every 8 (eight) hours. 02/27/21  Yes Margarette Canada, NP  bisacodyl (DULCOLAX) 5 MG EC tablet Take 5-15 mg by mouth daily as needed for moderate constipation.   Yes [provider]  calcium carbonate (TUMS EX) 750 MG  chewable tablet Chew 1 tablet by mouth daily as needed for heartburn.   Yes [provider]  cetirizine (ZYRTEC) 10 MG tablet Take 10 mg by mouth in the morning.   Yes [provider]  Cholecalciferol (VITAMIN D3) 50 MCG (2000 UT) TABS Take 2,000 Units by mouth in the morning.   Yes [provider]  [facility] Liver Oil 1250-130 units CAPS Take 1 capsule by mouth in the morning.   Yes [provider]  Digestive Enzymes (PAPAYA AND ENZYMES PO) Take 4 capsules by mouth daily as needed (digestive health).   Yes [provider]  diphenhydrAMINE (BENADRYL) 25 mg capsule Take 25 mg by mouth every 6 (six) hours as needed for itching.   Yes [provider]  docusate sodium (COLACE) 100 MG capsule Take 100-300 mg by mouth daily as needed for mild constipation.   Yes [provider]  doxycycline (VIBRAMYCIN) 100 MG capsule Take 1 capsule (100 mg total) by mouth 2 (two) times daily. 02/27/21  Yes Margarette Canada, NP  fluticasone (FLONASE) 50 MCG/ACT nasal spray Place 1 spray into both nostrils daily as needed for allergies.   Yes [provider]  Homeopathic Products ([employer] LEG CRAMPS PAIN RELIEF) TABS Take 1-2 tablets by mouth every 4 (four) hours as needed (leg cramps).   Yes [provider]  Homeopathic Products (EARACHE RELIEF OT) Place 2 drops in ear(s) daily as needed (earache).   Yes [provider]  Homeopathic Products [facility] COLD REMEDY PO) Take 1 tablet by mouth every 4 (four) hours as needed. COLD SYMPTOMS   Yes [provider]  hydrochlorothiazide (HYDRODIURIL) 50 MG tablet Take 25 mg by mouth in the morning. 07/29/20  Yes [provider]  ipratropium (ATROVENT) 0.06 % nasal spray Place 2 sprays into both nostrils 4 (four) times daily. 02/27/21  Yes Margarette Canada, NP  L-ARGININE PO Take 1 capsule by mouth in the morning and at bedtime. With  L-citrulline   Yes [provider]  loperamide (IMODIUM) 2 MG  capsule Take 2-4 mg by mouth 4 (four) times daily as needed for diarrhea or loose stools.   Yes [provider]  Magnesium Salicylate Tetrahyd (PERCOGESIC BACKACHE RELIEF) 580 MG TABS Take 2 tablets by mouth every 6 (six) hours as needed (back pain).   Yes [provider]  melatonin 5 MG TABS Take 5 mg by mouth at bedtime as needed (sleep).   Yes [provider]  meloxicam (MOBIC) 15 MG tablet Take 1 tablet (15 mg total) by mouth daily. 05/21/22  Yes Margarette Canada, NP  miconazole (MICOTIN) 2 % powder Apply 1 application topically 2 (two) times daily as needed for itching (jock itch).   Yes  [provider]  Multiple Vitamin (MULTIVITAMIN WITH MINERALS) TABS tablet Take 1 tablet by mouth in the morning.   Yes [provider]  nebivolol (BYSTOLIC) 5 MG tablet Take 5 mg by mouth in the morning.   Yes [provider]  neomycin-bacitracin-polymyxin (NEOSPORIN) [phone] ointment Apply 1 application topically 4 (four) times daily as needed (cuts/scrapes/wound care).   Yes [provider]  Omega-3 Fatty Acids (FISH OIL) 1200 MG CAPS Take 1,200 mg by mouth in the morning and at bedtime.   Yes [provider]  omeprazole (PRILOSEC) 20 MG capsule Take 20 mg by mouth in the morning.   Yes [provider]  OVER THE COUNTER MEDICATION Take 1,200 mg by mouth in the morning and at bedtime. BEET ROOT CAPSULES   Yes [provider]  [place]   Yes [provider]  PHOSPHATIDYLSERINE PO Take 400 mg by mouth in the morning and at bedtime.   Yes [provider]  Polyethyl Glycol-Propyl Glycol (SYSTANE OP) Place 1-2 drops into both eyes 4 (four) times daily as needed (eye allergies/dryness).   Yes [provider]  polyethylene glycol powder (GLYCOLAX/MIRALAX) 17 GM/SCOOP powder Take 17 g by mouth daily as needed for mild constipation.   Yes [provider]  Potassium 99 MG TABS Take 99 mg by mouth in the morning.   Yes [provider]  Pramox-PE-Glycerin-Petrolatum (PREPARATION H) 1-0.25-14.4-15 % CREA Apply 1 application topically as needed (rectal discomfort).   Yes [provider]  promethazine-dextromethorphan (PROMETHAZINE-DM) 6.25-15 MG/5ML syrup Take 5 mLs by mouth 4 (four) times daily as needed. 02/27/21  Yes Margarette Canada, NP  pseudoephedrine (SUDAFED) 30 MG tablet Take 60 mg by mouth every 4 (four) hours as needed for congestion.   Yes [provider]  Scar Treatment Products [facility]) GEL Apply 1 application topically daily.   Yes [provider]  sildenafil (REVATIO) 20 MG tablet Take 20-100 mg by mouth daily as needed (ED).    Yes [provider]  Simethicone 125 MG CAPS Take 125-250 mg by mouth 3 (three) times daily as needed (gas relief).   Yes [provider]  sodium chloride (OCEAN) 0.65 % SOLN nasal spray Place 1 spray into both nostrils as needed for congestion.   Yes [provider]  tamsulosin (FLOMAX) 0.4 MG CAPS capsule Take 0.8 mg by mouth at bedtime.   Yes [provider]  TURMERIC CURCUMIN PO Take 2,250 mg by mouth in the morning and at bedtime.   Yes [provider]  valsartan (DIOVAN) 160 MG tablet Take 160 mg by mouth in the morning. 10/27/18  Yes [provider]  losartan (COZAAR) 100 MG tablet Take 100 mg by mouth daily. 09/05/18 01/05/19  [provider]  losartan-hydrochlorothiazide (HYZAAR) 100-12.5 MG tablet Take 1 tablet by mouth daily.  10/03/18  [provider]    Family History Family History  Problem Relation Age of Onset   Breast cancer Mother    Hypertension Mother    Hyperlipidemia Mother    Heart failure Mother    Arthritis Mother    Heart attack Father    Hypertension Father    Thyroid cancer Father    Thyroid cancer Sister    Heart failure Sister    Heart disease Brother    Alcohol abuse  Brother    Gout Niece     Social History Social History   Tobacco Use   Smoking  status: Former    Types: Pipe    Quit date: 05/07/1967    Years since quitting: 55.0   Smokeless tobacco: Never  Vaping Use   Vaping Use: Never used  Substance Use Topics   Alcohol use: Yes    Comment: rarely   Drug use: Never     Allergies   Penicillins, Augmentin [amoxicillin-pot clavulanate], Codeine, and Wound dressing adhesive   Review of Systems Review of Systems  Constitutional:  Negative for fever.  Musculoskeletal:  Positive for arthralgias and joint swelling.  Skin:  Negative for color change.  Neurological:  Negative for weakness and numbness.     Physical Exam Triage Vital Signs ED Triage Vitals  Enc Vitals Group     BP      Pulse      Resp      Temp      Temp src      SpO2      Weight      Height      Head Circumference      Peak Flow      Pain Score      Pain Loc      Pain Edu?      Excl. in [place]?    No data found.  Updated Vital Signs BP (!) 173/81 (BP Location: Left Arm)   Pulse 64   Temp (!) 97.4 F (36.3 C) (Oral)   Resp 16   Ht 5\' 10"  (1.778 m)   Wt 228 lb (103.4 kg)   SpO2 95%   BMI 32.71 kg/m   Visual Acuity Right Eye Distance:   Left Eye Distance:   Bilateral Distance:    Right Eye Near:   Left Eye Near:    Bilateral Near:     Physical Exam Vitals and nursing note reviewed.  Constitutional:      Appearance: Normal appearance. He is not ill-appearing.  Musculoskeletal:        General: Swelling and tenderness present. No deformity or signs of injury.  Skin:    General: Skin is warm and dry.     Findings: No bruising or erythema.  Neurological:     Mental Status: He is alert.      UC Treatments / Results  Labs (all labs ordered are listed, but only abnormal results are displayed) Labs Reviewed - No data to display  EKG   Radiology DG ELBOW COMPLETE LEFT (3+VIEW)  Result Date: 05/21/2022 CLINICAL DATA:  Injury 2 days ago,  lifting a heavy container EXAM: LEFT ELBOW - COMPLETE 3+ VIEW COMPARISON:  None Available. FINDINGS: No fracture or dislocation of the left elbow. Severe elbow joint arthrosis. Elbow joint effusion. There is no evidence of arthropathy or other focal bone abnormality. Mild soft tissue edema over the olecranon. IMPRESSION: 1. No displaced fracture or dislocation of the left elbow. 2. Severe elbow joint arthrosis. 3. Elbow joint effusion, which may be reactive to arthrosis or alternately a radiographically occult nondisplaced radial head or neck fracture depending upon mechanism of injury. 4. Mild soft tissue edema over the olecranon. Electronically Signed   By: Delanna Ahmadi M.D.   On: 05/21/2022 11:46    Procedures Procedures (including critical care time)  Medications Ordered in UC Medications - No data to display  Initial Impression / Assessment and Plan / UC Course  I have reviewed the triage vital signs and the nursing notes.  Pertinent labs & imaging results that were available during my care of the patient  were reviewed by me and considered in my medical decision making (see chart for details).   Patient is a pleasant, nontoxic-appearing 77 year old male presenting for evaluation of left elbow pain and limited range of motion x 2 days.  He states he initially felt pain when he was trying to put a heavy tray of spices on a high shelf and he had his arms at an odd angle.  He felt a pain in his elbow but did not feel a pop.  He has no numbness or tingling in his fingers.  Bilateral grips are 5/5.  He does have limited flexion and extension and reports pain above the olecranon process with pronation supination of his wrist.  Radial ulnar pulses are 2+.  There is no ecchymosis or erythema noted.  Mild swelling to the posterior aspect of the elbow above the olecranon process.  No tenderness with compression of the medial or lateral epicondyles and no tenderness with direct pressure to the olecranon  process.  I will obtain a radiograph the left elbow to look for any acute bony abnormality.  Left elbow x-ray independently reviewed and evaluated by me.  Impression: No evidence of fracture or dislocation.  There are some degenerative changes present.  There is a large osteophyte present over the tip of the olecranon process.  Radiology overread is pending. Radiology impression states no fracture or dislocation of the left elbow.  There is severe elbow joint arthrosis and an elbow joint effusion.  Mild soft tissue edema over the olecranon.  I will discharge patient home with a diagnosis of arthritis of the left elbow.  I will treat him with home physical therapy exercises, moist heat application, and we will start him on meloxicam 15 mg once daily for the next 10 days to see if it improves his symptoms.   Final Clinical Impressions(s) / UC Diagnoses   Final diagnoses:  Osteoarthritis of left elbow, unspecified osteoarthritis type     Discharge Instructions      Your x-ray did not show any broken bones but it did show significant arthritis of your elbow as well as a small amount of fluid.  I believe that the heavy lifting has inflamed your arthritis and that is what is causing the pain and limited motion in your elbow joint.  Take the meloxicam once daily with food to help with pain and inflammation.  Take this for the next week to 10 days.  You may apply moist heat to your left elbow for 20 minutes at a time 2-3 times a day to aid in pain relief and also help with joint mobility.  Follow the range of motion exercises given your discharge instructions.  If your symptoms do not improve, or they worsen, either return for reevaluation or follow-up with orthopedics.     ED Prescriptions     Medication Sig Dispense Auth. Provider   meloxicam (MOBIC) 15 MG tablet Take 1 tablet (15 mg total) by mouth daily. 15 tablet Margarette Canada, NP      PDMP not reviewed this encounter.   Margarette Canada, NP 05/21/22 [phone]

## 2022-05-22 DIAGNOSIS — G4733 Obstructive sleep apnea (adult) (pediatric): Secondary | ICD-10-CM | POA: Diagnosis not present

## 2022-05-28 DIAGNOSIS — E1159 Type 2 diabetes mellitus with other circulatory complications: Secondary | ICD-10-CM | POA: Diagnosis not present

## 2022-05-28 DIAGNOSIS — Z79899 Other long term (current) drug therapy: Secondary | ICD-10-CM | POA: Diagnosis not present

## 2022-05-28 DIAGNOSIS — E7800 Pure hypercholesterolemia, unspecified: Secondary | ICD-10-CM | POA: Diagnosis not present

## 2022-05-28 DIAGNOSIS — I152 Hypertension secondary to endocrine disorders: Secondary | ICD-10-CM | POA: Diagnosis not present

## 2022-05-28 DIAGNOSIS — Z125 Encounter for screening for malignant neoplasm of prostate: Secondary | ICD-10-CM | POA: Diagnosis not present

## 2022-06-11 DIAGNOSIS — R972 Elevated prostate specific antigen [PSA]: Secondary | ICD-10-CM | POA: Diagnosis not present

## 2022-06-11 DIAGNOSIS — Z0000 Encounter for general adult medical examination without abnormal findings: Secondary | ICD-10-CM | POA: Diagnosis not present

## 2022-06-11 DIAGNOSIS — Z1331 Encounter for screening for depression: Secondary | ICD-10-CM | POA: Diagnosis not present

## 2022-06-11 DIAGNOSIS — Z79899 Other long term (current) drug therapy: Secondary | ICD-10-CM | POA: Diagnosis not present

## 2022-06-11 DIAGNOSIS — N400 Enlarged prostate without lower urinary tract symptoms: Secondary | ICD-10-CM | POA: Diagnosis not present

## 2022-07-14 ENCOUNTER — Encounter: Admit: 2022-07-14 | Discharge: 2022-07-14 | Payer: MEDICARE

## 2022-07-27 ENCOUNTER — Encounter: Admit: 2022-07-27 | Discharge: 2022-07-27 | Payer: MEDICARE

## 2022-07-30 DIAGNOSIS — E042 Nontoxic multinodular goiter: Secondary | ICD-10-CM | POA: Diagnosis not present

## 2022-08-03 DIAGNOSIS — N401 Enlarged prostate with lower urinary tract symptoms: Secondary | ICD-10-CM | POA: Diagnosis not present

## 2022-08-03 DIAGNOSIS — R972 Elevated prostate specific antigen [PSA]: Secondary | ICD-10-CM | POA: Diagnosis not present

## 2022-08-03 DIAGNOSIS — R350 Frequency of micturition: Secondary | ICD-10-CM | POA: Diagnosis not present

## 2022-08-06 DIAGNOSIS — E042 Nontoxic multinodular goiter: Secondary | ICD-10-CM | POA: Diagnosis not present

## 2022-08-06 DIAGNOSIS — E119 Type 2 diabetes mellitus without complications: Secondary | ICD-10-CM | POA: Diagnosis not present

## 2022-08-14 ENCOUNTER — Ambulatory Visit (INDEPENDENT_AMBULATORY_CARE_PROVIDER_SITE_OTHER): Payer: PPO

## 2022-08-14 ENCOUNTER — Encounter (HOSPITAL_COMMUNITY): Payer: Self-pay

## 2022-08-14 ENCOUNTER — Ambulatory Visit (HOSPITAL_COMMUNITY)
Admission: RE | Admit: 2022-08-14 | Discharge: 2022-08-14 | Disposition: A | Discharge status: Home / Self Care | Payer: PPO | Source: Ambulatory Visit | Attending: Emergency Medicine | Admitting: Emergency Medicine

## 2022-08-14 VITALS — BP 171/81 | HR 67 | Temp 97.6°F | Resp 20 | Ht 69.0 in | Wt 233.0 lb

## 2022-08-14 DIAGNOSIS — J069 Acute upper respiratory infection, unspecified: Secondary | ICD-10-CM

## 2022-08-14 DIAGNOSIS — Z87891 Personal history of nicotine dependence: Secondary | ICD-10-CM | POA: Insufficient documentation

## 2022-08-14 DIAGNOSIS — R059 Cough, unspecified: Secondary | ICD-10-CM | POA: Diagnosis not present

## 2022-08-14 DIAGNOSIS — R062 Wheezing: Secondary | ICD-10-CM | POA: Insufficient documentation

## 2022-08-14 DIAGNOSIS — Z1152 Encounter for screening for COVID-19: Secondary | ICD-10-CM | POA: Insufficient documentation

## 2022-08-14 MED ORDER — METHYLPREDNISOLONE SODIUM SUCC 125 MG IJ SOLR
INTRAMUSCULAR | Status: AC
  Filled 2022-08-14: qty 2

## 2022-08-14 MED ORDER — PROMETHAZINE-DM 6.25-15 MG/5ML PO SYRP: 5.0000 mL | ORAL_SOLUTION | Freq: Four times a day (QID) | ORAL | 0 refills | Status: AC | PRN

## 2022-08-14 MED ORDER — METHYLPREDNISOLONE SODIUM SUCC 125 MG IJ SOLR
60.0000 mg | Freq: Once | INTRAMUSCULAR | Status: AC
  Administered 2022-08-14: 60 mg via INTRAMUSCULAR

## 2022-08-14 NOTE — ED Triage Notes (Signed)
Patient having cough, sinus congestion, and scratchy throat onset Tuesday. Patient states his sister is sick as well. Tested negative for COVID, the sister.   Has tried Nyquil, tylenol, and Zycam and Flonase with slight relief.

## 2022-08-14 NOTE — Discharge Instructions (Addendum)
Your chest x-ray was negative for any infection or infiltrate.  Due to your expiratory wheeze, we are giving you a dose of intramuscular steroids.  This might temporarily raise your blood sugar.  You can use the cough medications up to 4 times daily as needed, do not drink or drive on this medication as it may cause drowsiness.  If you find that the 5 mL dose is too sedating, you can do a 2-1/2 mL dose.   Please return to clinic or follow-up with your PCP if your symptoms persist beyond the next week, you develop shortness of breath, wheezing, fevers, or any new concerning symptoms.

## 2022-08-14 NOTE — ED Provider Notes (Signed)
[facility]    CSN: [id number] Arrival date & time: 08/14/22  1237      History   Chief Complaint Chief Complaint  Patient presents with   URI   Appointment    HPI Andrew Myers is a 77 y.o. male.   Patient presents to clinic for complaints of cough, nasal congestion, sinus pressure and sore throat this Tuesday.  Cough is intermittently productive with a white-green sputum.  He denies any wheezing, chest pain or shortness of breath.  Sister was recently ill with similar symptoms, had a negative home test for COVID-19.  Has been taking Nyquil, Tylenol and Flonase without much relief.   Smoked in his 36s, not active smoker, hx of empyema and CAP.     The history is provided by the patient and medical records.  URI Presenting symptoms: congestion, cough, rhinorrhea and sore throat   Presenting symptoms: no fatigue and no fever   Associated symptoms: sinus pain     Past Medical History:  Diagnosis Date   Allergic rhinitis    Arthritis    BPH (benign prostatic hyperplasia)    CAP (community acquired pneumonia) 04/2019   Empyema (HCC)    Empyema lung (HCC) 06/2019   Family history of adverse reaction to anesthesia    Sister with nausea and vomiting   GERD (gastroesophageal reflux disease)    Hiatal hernia    History of adenomatous polyp of colon    History of chickenpox    History of kidney stones    History of wrist fracture    Hyperlipidemia    Hypertension    Obesity    Pleural effusion on right 05/2019   Pre-diabetes    Skin cancer    BCC, SCC   Sleep apnea    Uses Bi-Pap   Sleep apnea     Patient Active Problem List   Diagnosis Date Noted   Total knee replacement status 11/04/2020   Status post total right knee replacement 05/06/2020   Primary osteoarthritis of left knee 12/17/2019   Primary osteoarthritis of right knee 12/17/2019   Neck pain 07/18/2019   Empyema (HCC) 07/18/2019   Joint pain 07/18/2019   Hypokalemia  07/05/2019   Hyperglycemia 07/05/2019   Empyema of right pleural space (HCC) 07/05/2019   Hypertension    Hyperlipidemia    BPH (benign prostatic hyperplasia)    Obesity    Sleep apnea    Benign prostatic hyperplasia (BPH) with urinary urgency 05/02/2019   Elevated PSA 10/27/2018   Type 2 diabetes mellitus without complication, without long-term current use of insulin (HCC) 10/27/2018   Benign essential hypertension 09/11/2013   GERD (gastroesophageal reflux disease) 09/11/2013    Past Surgical History:  Procedure Laterality Date   APPENDECTOMY     CATARACT EXTRACTION Bilateral 2019   COLONOSCOPY N/A 01/09/2020   Procedure: COLONOSCOPY;  Surgeon: Regis Bill, MD;  Location: [facility];  Service: Endoscopy;  Laterality: N/A;   EMPYEMA DRAINAGE     EYE SURGERY     Laser surgery 2020   FINE NEEDLE ASPIRATION BIOPSY  04/16/2020   ISTHMUS THYROID AND MIDDLE LEFT THYROID -BOTH BENIGN   FRACTURE SURGERY  2003   repair 5th metacarpal fracture    JOINT REPLACEMENT     KNEE ARTHROPLASTY Right 05/06/2020   Procedure: COMPUTER ASSISTED TOTAL KNEE ARTHROPLASTY;  Surgeon: Donato Heinz, MD;  Location: [facility];  Service: Orthopedics;  Laterality: Right;   KNEE ARTHROPLASTY Left 11/04/2020   Procedure: COMPUTER  ASSISTED TOTAL KNEE ARTHROPLASTY;  Surgeon: Donato Heinz, MD;  Location: [facility];  Service: Orthopedics;  Laterality: Left;   LUMBAR PUNCTURE     PROSTATE BIOPSY  2016       Home Medications    Prior to Admission medications   Medication Sig Start Date End Date Taking? Authorizing Provider  acetaminophen (TYLENOL) 500 MG tablet Take 500 mg by mouth every 6 (six) hours as needed (pain.).   Yes [provider]  Ascorbic Acid (VITAMIN C) 1000 MG tablet Take 1,000 mg by mouth in the morning and at bedtime.   Yes [provider]  atorvastatin (LIPITOR) 20 MG tablet Take 20 mg by mouth at bedtime.    Yes [provider]  Bacillus  Coagulans-Inulin (PROBIOTIC-PREBIOTIC PO) Take 1 capsule by mouth in the morning.   Yes [provider]  benzonatate (TESSALON) 100 MG capsule Take 2 capsules (200 mg total) by mouth every 8 (eight) hours. 02/27/21  Yes Becky Augusta, NP  bisacodyl (DULCOLAX) 5 MG EC tablet Take 5-15 mg by mouth daily as needed for moderate constipation.   Yes [provider]  calcium carbonate (TUMS EX) 750 MG chewable tablet Chew 1 tablet by mouth daily as needed for heartburn.   Yes [provider]  cetirizine (ZYRTEC) 10 MG tablet Take 10 mg by mouth in the morning.   Yes [provider]  Cholecalciferol (VITAMIN D3) 50 MCG (2000 UT) TABS Take 2,000 Units by mouth in the morning.   Yes [provider]  [facility] Liver Oil 1250-130 units CAPS Take 1 capsule by mouth in the morning.   Yes [provider]  Digestive Enzymes (PAPAYA AND ENZYMES PO) Take 4 capsules by mouth daily as needed (digestive health).   Yes [provider]  diphenhydrAMINE (BENADRYL) 25 mg capsule Take 25 mg by mouth every 6 (six) hours as needed for itching.   Yes [provider]  docusate sodium (COLACE) 100 MG capsule Take 100-300 mg by mouth daily as needed for mild constipation.   Yes [provider]  doxycycline (VIBRAMYCIN) 100 MG capsule Take 1 capsule (100 mg total) by mouth 2 (two) times daily. 02/27/21  Yes Becky Augusta, NP  finasteride (PROSCAR) 5 MG tablet Take by mouth. 08/03/22  Yes [provider]  fluticasone (FLONASE) 50 MCG/ACT nasal spray Place 1 spray into both nostrils daily as needed for allergies.   Yes [provider]  Homeopathic Products ([employer] LEG CRAMPS PAIN RELIEF) TABS Take 1-2 tablets by mouth every 4 (four) hours as needed (leg cramps).   Yes [provider]  Homeopathic Products (EARACHE RELIEF OT) Place 2 drops in ear(s) daily as needed (earache).   Yes [provider]  Homeopathic Products [facility] COLD REMEDY  PO) Take 1 tablet by mouth every 4 (four) hours as needed. COLD SYMPTOMS   Yes [provider]  hydrochlorothiazide (HYDRODIURIL) 50 MG tablet Take 25 mg by mouth in the morning. 07/29/20  Yes [provider]  ipratropium (ATROVENT) 0.06 % nasal spray Place 2 sprays into both nostrils 4 (four) times daily. 02/27/21  Yes Becky Augusta, NP  L-ARGININE PO Take 1 capsule by mouth in the morning and at bedtime. With  L-citrulline   Yes [provider]  loperamide (IMODIUM) 2 MG capsule Take 2-4 mg by mouth 4 (four) times daily as needed for diarrhea or loose stools.   Yes [provider]  Magnesium Salicylate Tetrahyd (PERCOGESIC BACKACHE RELIEF) 580 MG TABS Take  2 tablets by mouth every 6 (six) hours as needed (back pain).   Yes [provider]  melatonin 5 MG TABS Take 5 mg by mouth at bedtime as needed (sleep).   Yes [provider]  meloxicam (MOBIC) 15 MG tablet Take 1 tablet (15 mg total) by mouth daily. 05/21/22  Yes Becky Augusta, NP  metFORMIN (GLUCOPHAGE-XR) 500 MG 24 hr tablet Take by mouth. 06/11/22  Yes [provider]  miconazole (MICOTIN) 2 % powder Apply 1 application topically 2 (two) times daily as needed for itching (jock itch).   Yes [provider]  Multiple Vitamin (MULTIVITAMIN WITH MINERALS) TABS tablet Take 1 tablet by mouth in the morning.   Yes [provider]  nebivolol (BYSTOLIC) 5 MG tablet Take 5 mg by mouth in the morning.   Yes [provider]  neomycin-bacitracin-polymyxin (NEOSPORIN) [phone] ointment Apply 1 application topically 4 (four) times daily as needed (cuts/scrapes/wound care).   Yes [provider]  Omega-3 Fatty Acids (FISH OIL) 1200 MG CAPS Take 1,200 mg by mouth in the morning and at bedtime.   Yes [provider]  omeprazole (PRILOSEC) 20 MG capsule Take 20 mg by mouth in the morning.   Yes [provider]  OVER THE COUNTER MEDICATION Take 1,200 mg  by mouth in the morning and at bedtime. BEET ROOT CAPSULES   Yes [provider]  OVER THE COUNTER MEDICATION RELEASE  DIETARY SUPPLEMENT   Yes [provider]  PHOSPHATIDYLSERINE PO Take 400 mg by mouth in the morning and at bedtime.   Yes [provider]  Polyethyl Glycol-Propyl Glycol (SYSTANE OP) Place 1-2 drops into both eyes 4 (four) times daily as needed (eye allergies/dryness).   Yes [provider]  polyethylene glycol powder (GLYCOLAX/MIRALAX) 17 GM/SCOOP powder Take 17 g by mouth daily as needed for mild constipation.   Yes [provider]  Potassium 99 MG TABS Take 99 mg by mouth in the morning.   Yes [provider]  Pramox-PE-Glycerin-Petrolatum (PREPARATION H) 1-0.25-14.4-15 % CREA Apply 1 application topically as needed (rectal discomfort).   Yes [provider]  promethazine-dextromethorphan (PROMETHAZINE-DM) 6.25-15 MG/5ML syrup Take 5 mLs by mouth 4 (four) times daily as needed for cough. 08/14/22  Yes Rinaldo Ratel, [place] N, FNP  pseudoephedrine (SUDAFED) 30 MG tablet Take 60 mg by mouth every 4 (four) hours as needed for congestion.   Yes [provider]  Scar Treatment Products [facility]) GEL Apply 1 application topically daily.   Yes [provider]  sildenafil (REVATIO) 20 MG tablet Take 20-100 mg by mouth daily as needed (ED).    Yes [provider]  Simethicone 125 MG CAPS Take 125-250 mg by mouth 3 (three) times daily as needed (gas relief).   Yes [provider]  sodium chloride (OCEAN) 0.65 % SOLN nasal spray Place 1 spray into both nostrils as needed for congestion.   Yes [provider]  tamsulosin (FLOMAX) 0.4 MG CAPS capsule Take 0.8 mg by mouth at bedtime.   Yes [provider]  TURMERIC CURCUMIN PO Take 2,250 mg by mouth in the morning and at bedtime.   Yes [provider]  valsartan (DIOVAN) 160 MG tablet Take 160 mg by mouth in the morning.  10/27/18  Yes [provider]  losartan (COZAAR) 100 MG tablet Take 100 mg by mouth daily. 09/05/18 01/05/19  [provider]  losartan-hydrochlorothiazide (HYZAAR) 100-12.5 MG tablet Take 1 tablet by mouth daily.  10/03/18  [provider]    Family History Family History  Problem Relation Age of Onset   Breast cancer Mother    Hypertension Mother    Hyperlipidemia Mother    Heart failure Mother    Arthritis Mother    Heart attack Father    Hypertension Father    Thyroid cancer Father    Thyroid cancer Sister    Heart failure Sister    Heart disease Brother    Alcohol abuse Brother    Gout Niece     Social History Social History   Tobacco Use   Smoking status: Former    Types: Pipe    Quit date: 05/07/1967    Years since quitting: 55.3   Smokeless tobacco: Never  Vaping Use   Vaping Use: Never used  Substance Use Topics   Alcohol use: Yes    Comment: rarely   Drug use: Never     Allergies   Penicillins, Augmentin [amoxicillin-pot clavulanate], Codeine, and Wound dressing adhesive   Review of Systems Review of Systems  Constitutional:  Negative for chills, fatigue and fever.  HENT:  Positive for congestion, rhinorrhea, sinus pressure, sinus pain and sore throat. Negative for trouble swallowing and voice change.   Respiratory:  Positive for cough.   Cardiovascular:  Negative for chest pain.     Physical Exam Triage Vital Signs ED Triage Vitals  Enc Vitals Group     BP 08/14/22 1254 (!) 171/81     Pulse Rate 08/14/22 1254 67     Resp 08/14/22 1254 20     Temp 08/14/22 1254 97.6 F (36.4 C)     Temp Source 08/14/22 1254 Oral     SpO2 08/14/22 1254 95 %     Weight 08/14/22 1253 233 lb (105.7 kg)     Height 08/14/22 1253 5\' 9"  (1.753 m)     Head Circumference --      Peak Flow --      Pain Score 08/14/22 1252 0     Pain Loc --      Pain Edu? --      Excl. in GC? --    No data found.  Updated Vital Signs BP (!) 171/81 (BP  Location: Right Arm)   Pulse 67   Temp 97.6 F (36.4 C) (Oral)   Resp 20   Ht 5\' 9"  (1.753 m)   Wt 233 lb (105.7 kg)   SpO2 95%   BMI 34.41 kg/m   Visual Acuity Right Eye Distance:   Left Eye Distance:   Bilateral Distance:    Right Eye Near:   Left Eye Near:    Bilateral Near:     Physical Exam Vitals and nursing note reviewed.  Constitutional:      Appearance: Normal appearance.  HENT:     Head: Normocephalic and atraumatic.     Right Ear: External ear normal.     Left Ear: External ear normal.     Nose: Congestion and rhinorrhea present.     Mouth/Throat:     Mouth: Mucous membranes are moist.     Pharynx: Posterior oropharyngeal erythema present.  Eyes:     Conjunctiva/sclera: Conjunctivae normal.  Cardiovascular:     Rate and Rhythm: Normal rate and regular rhythm.     Heart sounds: Normal heart sounds. No murmur heard. Pulmonary:     Effort: Pulmonary effort is normal.     Breath sounds: Examination of the right-upper field reveals wheezing. Examination of the left-upper field  reveals wheezing. Wheezing present.     Comments: Expiratory wheezing in upper lobes. Musculoskeletal:        General: Normal range of motion.     Cervical back: Normal range of motion.  Lymphadenopathy:     Cervical: Cervical adenopathy present.  Skin:    General: Skin is warm and dry.  Neurological:     General: No focal deficit present.     Mental Status: He is alert.  Psychiatric:        Mood and Affect: Mood normal.        Behavior: Behavior is cooperative.      UC Treatments / Results  Labs (all labs ordered are listed, but only abnormal results are displayed) Labs Reviewed  SARS CORONAVIRUS 2 (TAT 6-24 HRS)    EKG   Radiology DG Chest 2 View  Result Date: 08/14/2022 CLINICAL DATA:  Cough and wheezing EXAM: CHEST - 2 VIEW COMPARISON:  06/07/2021 FINDINGS: No consolidation, pneumothorax or effusion. No edema. Normal cardiopericardial silhouette. Degenerative  changes along the spine. IMPRESSION: No acute cardiopulmonary disease. Electronically Signed   By: Karen Kays M.D.   On: 08/14/2022 13:19    Procedures Procedures (including critical care time)  Medications Ordered in UC Medications  methylPREDNISolone sodium succinate (SOLU-MEDROL) 125 mg/2 mL injection 60 mg (has no administration in time range)    Initial Impression / Assessment and Plan / UC Course  I have reviewed the triage vital signs and the nursing notes.  Pertinent labs & imaging results that were available during my care of the patient were reviewed by me and considered in my medical decision making (see chart for details).  Vitals and triage reviewed, patient is hemodynamically stable.  Recent sick contact with similar symptoms.  Expiratory wheezing heard in upper lobes.  Chest x-ray negative for infiltrate or infection. Discussed that this is most likely viral in nature, COVID-19 swab obtained.  Given one-time IM Solu-Medrol dose for expiratory wheezing.  Patient is a type II diabetic, advised temporary increase in blood sugars.  Provided with cough syrup, encouraged symptomatic management.  Plan of care, follow-up care and return precautions given, no questions at this time.     Final Clinical Impressions(s) / UC Diagnoses   Final diagnoses:  Viral URI with cough     Discharge Instructions      Your chest x-ray was negative for any infection or infiltrate.  Due to your expiratory wheeze, we are giving you a dose of intramuscular steroids.  This might temporarily raise your blood sugar.  You can use the cough medications up to 4 times daily as needed, do not drink or drive on this medication as it may cause drowsiness.  If you find that the 5 mL dose is too sedating, you can do a 2-1/2 mL dose.   Please return to clinic or follow-up with your PCP if your symptoms persist beyond the next week, you develop shortness of breath, wheezing, fevers, or any new concerning  symptoms.      ED Prescriptions     Medication Sig Dispense Auth. Provider   promethazine-dextromethorphan (PROMETHAZINE-DM) 6.25-15 MG/5ML syrup Take 5 mLs by mouth 4 (four) times daily as needed for cough. 118 mL Dailee Manalang, [place] N, [place]      PDMP not reviewed this encounter.   Lashanta Elbe, [place] N, [place] 08/14/22 1329

## 2022-08-15 LAB — SARS CORONAVIRUS 2 (TAT 6-24 HRS): SARS Coronavirus 2: NEGATIVE

## 2022-09-02 DIAGNOSIS — G4733 Obstructive sleep apnea (adult) (pediatric): Secondary | ICD-10-CM | POA: Diagnosis not present

## 2022-10-02 DIAGNOSIS — G4733 Obstructive sleep apnea (adult) (pediatric): Secondary | ICD-10-CM | POA: Diagnosis not present

## 2022-10-07 DIAGNOSIS — H35033 Hypertensive retinopathy, bilateral: Secondary | ICD-10-CM | POA: Diagnosis not present

## 2022-10-15 DIAGNOSIS — E1165 Type 2 diabetes mellitus with hyperglycemia: Secondary | ICD-10-CM | POA: Diagnosis not present

## 2022-10-15 DIAGNOSIS — Z96652 Presence of left artificial knee joint: Secondary | ICD-10-CM | POA: Diagnosis not present

## 2022-10-15 DIAGNOSIS — Z96651 Presence of right artificial knee joint: Secondary | ICD-10-CM | POA: Diagnosis not present

## 2022-12-09 DIAGNOSIS — R972 Elevated prostate specific antigen [PSA]: Secondary | ICD-10-CM | POA: Diagnosis not present

## 2022-12-10 DIAGNOSIS — E7800 Pure hypercholesterolemia, unspecified: Secondary | ICD-10-CM | POA: Diagnosis not present

## 2022-12-10 DIAGNOSIS — Z79899 Other long term (current) drug therapy: Secondary | ICD-10-CM | POA: Diagnosis not present

## 2022-12-10 DIAGNOSIS — E1165 Type 2 diabetes mellitus with hyperglycemia: Secondary | ICD-10-CM | POA: Diagnosis not present

## 2022-12-16 DIAGNOSIS — R972 Elevated prostate specific antigen [PSA]: Secondary | ICD-10-CM | POA: Diagnosis not present

## 2022-12-16 DIAGNOSIS — N401 Enlarged prostate with lower urinary tract symptoms: Secondary | ICD-10-CM | POA: Diagnosis not present

## 2022-12-16 DIAGNOSIS — N50812 Left testicular pain: Secondary | ICD-10-CM | POA: Diagnosis not present

## 2022-12-16 DIAGNOSIS — R350 Frequency of micturition: Secondary | ICD-10-CM | POA: Diagnosis not present

## 2022-12-17 DIAGNOSIS — Z79899 Other long term (current) drug therapy: Secondary | ICD-10-CM | POA: Diagnosis not present

## 2022-12-17 DIAGNOSIS — G4733 Obstructive sleep apnea (adult) (pediatric): Secondary | ICD-10-CM | POA: Diagnosis not present

## 2022-12-17 DIAGNOSIS — R972 Elevated prostate specific antigen [PSA]: Secondary | ICD-10-CM | POA: Diagnosis not present

## 2022-12-17 DIAGNOSIS — E7800 Pure hypercholesterolemia, unspecified: Secondary | ICD-10-CM | POA: Diagnosis not present

## 2022-12-17 DIAGNOSIS — E1159 Type 2 diabetes mellitus with other circulatory complications: Secondary | ICD-10-CM | POA: Diagnosis not present

## 2022-12-17 DIAGNOSIS — N400 Enlarged prostate without lower urinary tract symptoms: Secondary | ICD-10-CM | POA: Diagnosis not present

## 2022-12-17 DIAGNOSIS — I152 Hypertension secondary to endocrine disorders: Secondary | ICD-10-CM | POA: Diagnosis not present

## 2023-01-01 DIAGNOSIS — G4733 Obstructive sleep apnea (adult) (pediatric): Secondary | ICD-10-CM | POA: Diagnosis not present

## 2023-01-31 ENCOUNTER — Other Ambulatory Visit: Payer: Self-pay | Admitting: Medical Genetics

## 2023-02-03 ENCOUNTER — Other Ambulatory Visit
Admission: RE | Admit: 2023-02-03 | Discharge: 2023-02-03 | Disposition: A | Discharge status: Home / Self Care | Payer: Self-pay | Source: Ambulatory Visit | Attending: Medical Genetics | Admitting: Medical Genetics

## 2023-02-04 DIAGNOSIS — G4733 Obstructive sleep apnea (adult) (pediatric): Secondary | ICD-10-CM | POA: Diagnosis not present

## 2023-02-08 DIAGNOSIS — Z85828 Personal history of other malignant neoplasm of skin: Secondary | ICD-10-CM | POA: Diagnosis not present

## 2023-02-08 DIAGNOSIS — Z86018 Personal history of other benign neoplasm: Secondary | ICD-10-CM | POA: Diagnosis not present

## 2023-02-08 DIAGNOSIS — Z872 Personal history of diseases of the skin and subcutaneous tissue: Secondary | ICD-10-CM | POA: Diagnosis not present

## 2023-02-08 DIAGNOSIS — L570 Actinic keratosis: Secondary | ICD-10-CM | POA: Diagnosis not present

## 2023-02-08 DIAGNOSIS — L578 Other skin changes due to chronic exposure to nonionizing radiation: Secondary | ICD-10-CM | POA: Diagnosis not present

## 2023-02-13 LAB — GENECONNECT MOLECULAR SCREEN: Genetic Analysis Overall Interpretation: NEGATIVE

## 2023-04-12 DIAGNOSIS — R972 Elevated prostate specific antigen [PSA]: Secondary | ICD-10-CM | POA: Diagnosis not present

## 2023-04-19 DIAGNOSIS — N401 Enlarged prostate with lower urinary tract symptoms: Secondary | ICD-10-CM | POA: Diagnosis not present

## 2023-04-19 DIAGNOSIS — R350 Frequency of micturition: Secondary | ICD-10-CM | POA: Diagnosis not present

## 2023-05-05 DIAGNOSIS — G4733 Obstructive sleep apnea (adult) (pediatric): Secondary | ICD-10-CM | POA: Diagnosis not present

## 2023-05-17 DIAGNOSIS — N401 Enlarged prostate with lower urinary tract symptoms: Secondary | ICD-10-CM | POA: Diagnosis not present

## 2023-05-17 DIAGNOSIS — R350 Frequency of micturition: Secondary | ICD-10-CM | POA: Diagnosis not present

## 2023-06-15 DIAGNOSIS — E1159 Type 2 diabetes mellitus with other circulatory complications: Secondary | ICD-10-CM | POA: Diagnosis not present

## 2023-06-15 DIAGNOSIS — E7800 Pure hypercholesterolemia, unspecified: Secondary | ICD-10-CM | POA: Diagnosis not present

## 2023-06-15 DIAGNOSIS — Z79899 Other long term (current) drug therapy: Secondary | ICD-10-CM | POA: Diagnosis not present

## 2023-06-15 DIAGNOSIS — I152 Hypertension secondary to endocrine disorders: Secondary | ICD-10-CM | POA: Diagnosis not present

## 2023-06-21 ENCOUNTER — Encounter: Admit: 2023-06-21 | Discharge: 2023-06-21 | Payer: MEDICARE

## 2023-06-22 DIAGNOSIS — I10 Essential (primary) hypertension: Secondary | ICD-10-CM | POA: Diagnosis not present

## 2023-06-22 DIAGNOSIS — E7800 Pure hypercholesterolemia, unspecified: Secondary | ICD-10-CM | POA: Diagnosis not present

## 2023-06-22 DIAGNOSIS — Z0000 Encounter for general adult medical examination without abnormal findings: Secondary | ICD-10-CM | POA: Diagnosis not present

## 2023-06-22 DIAGNOSIS — E1159 Type 2 diabetes mellitus with other circulatory complications: Secondary | ICD-10-CM | POA: Diagnosis not present

## 2023-06-22 DIAGNOSIS — Z79899 Other long term (current) drug therapy: Secondary | ICD-10-CM | POA: Diagnosis not present

## 2023-06-22 DIAGNOSIS — Z1331 Encounter for screening for depression: Secondary | ICD-10-CM | POA: Diagnosis not present

## 2023-06-22 DIAGNOSIS — I152 Hypertension secondary to endocrine disorders: Secondary | ICD-10-CM | POA: Diagnosis not present

## 2023-06-25 DIAGNOSIS — N139 Obstructive and reflux uropathy, unspecified: Secondary | ICD-10-CM | POA: Diagnosis not present

## 2023-06-25 DIAGNOSIS — R8271 Bacteriuria: Secondary | ICD-10-CM | POA: Diagnosis not present

## 2023-07-08 DIAGNOSIS — Z13228 Encounter for screening for other metabolic disorders: Secondary | ICD-10-CM | POA: Diagnosis not present

## 2023-07-27 ENCOUNTER — Encounter: Admit: 2023-07-27 | Discharge: 2023-07-27 | Payer: MEDICARE

## 2023-07-29 DIAGNOSIS — E042 Nontoxic multinodular goiter: Secondary | ICD-10-CM | POA: Diagnosis not present

## 2023-07-30 DIAGNOSIS — G4733 Obstructive sleep apnea (adult) (pediatric): Secondary | ICD-10-CM | POA: Diagnosis not present

## 2023-08-05 DIAGNOSIS — Z1331 Encounter for screening for depression: Secondary | ICD-10-CM | POA: Diagnosis not present

## 2023-08-05 DIAGNOSIS — E042 Nontoxic multinodular goiter: Secondary | ICD-10-CM | POA: Diagnosis not present

## 2023-08-25 DIAGNOSIS — G4733 Obstructive sleep apnea (adult) (pediatric): Secondary | ICD-10-CM | POA: Diagnosis not present

## 2023-10-12 DIAGNOSIS — Z96653 Presence of artificial knee joint, bilateral: Secondary | ICD-10-CM | POA: Diagnosis not present

## 2023-10-20 DIAGNOSIS — D485 Neoplasm of uncertain behavior of skin: Secondary | ICD-10-CM | POA: Diagnosis not present

## 2023-11-01 ENCOUNTER — Encounter: Admit: 2023-11-01 | Discharge: 2023-11-01 | Payer: MEDICARE

## 2023-11-04 DIAGNOSIS — R350 Frequency of micturition: Secondary | ICD-10-CM | POA: Diagnosis not present

## 2023-11-04 DIAGNOSIS — R972 Elevated prostate specific antigen [PSA]: Secondary | ICD-10-CM | POA: Diagnosis not present

## 2023-11-04 DIAGNOSIS — N401 Enlarged prostate with lower urinary tract symptoms: Secondary | ICD-10-CM | POA: Diagnosis not present

## 2023-11-14 DIAGNOSIS — G4733 Obstructive sleep apnea (adult) (pediatric): Secondary | ICD-10-CM | POA: Diagnosis not present

## 2023-12-06 DIAGNOSIS — H35033 Hypertensive retinopathy, bilateral: Secondary | ICD-10-CM | POA: Diagnosis not present

## 2023-12-06 DIAGNOSIS — H353131 Nonexudative age-related macular degeneration, bilateral, early dry stage: Secondary | ICD-10-CM | POA: Diagnosis not present

## 2023-12-06 DIAGNOSIS — H524 Presbyopia: Secondary | ICD-10-CM | POA: Diagnosis not present

## 2023-12-06 DIAGNOSIS — E119 Type 2 diabetes mellitus without complications: Secondary | ICD-10-CM | POA: Diagnosis not present

## 2023-12-23 DIAGNOSIS — E1159 Type 2 diabetes mellitus with other circulatory complications: Secondary | ICD-10-CM | POA: Diagnosis not present

## 2023-12-23 DIAGNOSIS — Z79899 Other long term (current) drug therapy: Secondary | ICD-10-CM | POA: Diagnosis not present

## 2023-12-23 DIAGNOSIS — I152 Hypertension secondary to endocrine disorders: Secondary | ICD-10-CM | POA: Diagnosis not present

## 2023-12-23 DIAGNOSIS — E7800 Pure hypercholesterolemia, unspecified: Secondary | ICD-10-CM | POA: Diagnosis not present

## 2023-12-30 DIAGNOSIS — Z79899 Other long term (current) drug therapy: Secondary | ICD-10-CM | POA: Diagnosis not present

## 2023-12-30 DIAGNOSIS — I152 Hypertension secondary to endocrine disorders: Secondary | ICD-10-CM | POA: Diagnosis not present

## 2023-12-30 DIAGNOSIS — Z125 Encounter for screening for malignant neoplasm of prostate: Secondary | ICD-10-CM | POA: Diagnosis not present

## 2023-12-30 DIAGNOSIS — E7800 Pure hypercholesterolemia, unspecified: Secondary | ICD-10-CM | POA: Diagnosis not present

## 2023-12-30 DIAGNOSIS — E1159 Type 2 diabetes mellitus with other circulatory complications: Secondary | ICD-10-CM | POA: Diagnosis not present

## 2023-12-30 NOTE — Progress Notes (Signed)
 Andrew Myers is a 78 y.o. male who is here for a follow up visit  DM: He is taking metformin and he tolerates this well. His A1C was 7.4 on recent labs. He notes that he hasn't done a great job of limiting carbohydrate intake.  HTN: He is taking olmesartan/hctz and bystolic  and he tolerates this well. He checks his blood pressure regularly and notes that it is typically 120s/80.  Hyperlipidemia: He is taking atorvastatin  and he tolerates this well. His LDL was 54 on recent labs.  Patient Active Problem List  Diagnosis  . Essential hypertension, benign  . Other and unspecified hyperlipidemia  . GERD (gastroesophageal reflux disease)  . Obstructive sleep apnea  . Elevated blood sugar level  . Type 2 diabetes mellitus without complication, without long-term current use of insulin (CMS/HHS-HCC)  . Elevated PSA  . Benign prostatic hyperplasia (BPH) with urinary urgency  . Arthralgia  . Need for hepatitis C screening test  . BPH with elevated PSA  . Empyema of right pleural space (CMS/HHS-HCC)  . Hypertension  . Hypokalemia  . Neck pain  . Obesity  . Status post total right knee replacement  . Status post total left knee replacement  . Uncontrolled type 2 diabetes mellitus with hyperglycemia (CMS/HHS-HCC)    Past Medical History:  Diagnosis Date  . Allergic rhinitis   . History of adenomatous polyp of colon   . History of chickenpox   . History of elevated PSA   . History of wrist fracture 06/23/2009   Left scaphoid nondisplaced fracture with avulsion fracture.    . Hyperlipidemia   . Hypertension   . Pneumonia 06/2019   Hospitalized with CT, thorocentesis right lung effusion  . Sleep apnea    Uses CPAP     Current Outpatient Medications:  .  acetaminophen  (TYLENOL ) 500 MG tablet, Take 500 mg by mouth every 8 (eight) hours as needed   , Disp: , Rfl:  .  aspirin  81 MG EC tablet, Take 81 mg by mouth 2 (two) times daily., Disp: , Rfl:  .  atorvastatin  (LIPITOR) 20 MG  tablet, TAKE 1 TABLET BY MOUTH EVERYDAY AT BEDTIME, Disp: 90 tablet, Rfl: 3 .  blood glucose meter kit, as directed, Disp: 1 each, Rfl: 0 .  cetirizine (ZYRTEC) 10 MG tablet, Take 10 mg by mouth once daily, Disp: , Rfl:  .  fluticasone  propionate (FLONASE ) 50 mcg/actuation nasal spray, Place 1 spray into both nostrils once daily as needed, Disp: , Rfl:  .  lancets, Use 1 each 3 (three) times daily Use as instructed., Disp: 100 each, Rfl: 12 .  loperamide  (IMODIUM ) 2 mg capsule, Take 2 mg by mouth as needed   , Disp: , Rfl:  .  metFORMIN (GLUCOPHAGE-XR) 500 MG XR tablet, TAKE 2 TABLETS BY MOUTH DAILY WITH DINNER, Disp: 180 tablet, Rfl: 3 .  miconazole  2 % powder, Apply topically as needed   , Disp: , Rfl:  .  multivitamin tablet, Take 1 tablet by mouth once daily., Disp: , Rfl:  .  nebivoloL  (BYSTOLIC ) 5 MG tablet, TAKE 1 TABLET BY MOUTH EVERY DAY, Disp: 90 tablet, Rfl: 1 .  olmesartan-hydroCHLOROthiazide  (BENICAR HCT) 40-25 mg tablet, TAKE 1 TABLET BY MOUTH EVERY DAY, Disp: 90 tablet, Rfl: 1 .  omeprazole (PRILOSEC) 20 MG DR capsule, Take 20 mg by mouth once daily as needed   , Disp: , Rfl:  .  ONETOUCH ULTRA TEST test strip, USE 1 STRIP 3 TIMES A DAY AS INSTRUCTED,  Disp: 100 strip, Rfl: 10 .  PHOSPHATIDYLSERINE-EPA-OMEGA 3 ORAL, Take 400 mg by mouth 2 (two) times daily, Disp: , Rfl:  .  tadalafiL (CIALIS) 5 MG tablet, Take 5 mg by mouth once daily, Disp: , Rfl:  .  tamsulosin  (FLOMAX ) 0.4 mg capsule, Take 0.4 mg by mouth 2 (two) times daily Take 30 minutes after same meal each day. (Patient not taking: Reported on 08/05/2023), Disp: , Rfl:   Allergies  Allergen Reactions  . Augmentin  [Amoxicillin -Pot Clavulanate] Other (See Comments)    Had a serum reaction to augmentin ; reactive arthritis    . Penicillins Other (See Comments)    Had a serum reaction to penicillin and augmentin ; reactive arthritis     . Codeine Itching  . Other Other (See Comments)    Narcotic Analgesics-  Constipation      Results for orders placed or performed in visit on 12/23/23  CBC w/auto Differential (5 Part)  Result Value Ref Range   WBC (White Blood Cell Count) 9.4 4.1 - 10.2 10^3/uL   RBC (Red Blood Cell Count) 4.71 4.69 - 6.13 10^6/uL   Hemoglobin 13.9 (L) 14.1 - 18.1 gm/dL   Hematocrit 58.4 59.9 - 52.0 %   MCV (Mean Corpuscular Volume) 88.1 80.0 - 100.0 fl   MCH (Mean Corpuscular Hemoglobin) 29.5 27.0 - 31.2 pg   MCHC (Mean Corpuscular Hemoglobin Concentration) 33.5 32.0 - 36.0 gm/dL   Platelet Count 817 849 - 450 10^3/uL   RDW-CV (Red Cell Distribution Width) 13.1 11.6 - 14.8 %   MPV (Mean Platelet Volume) 11.1 9.4 - 12.4 fl   Neutrophils 5.87 1.50 - 7.80 10^3/uL   Lymphocytes 2.44 1.00 - 3.60 10^3/uL   Monocytes 0.76 0.00 - 1.50 10^3/uL   Eosinophils 0.18 0.00 - 0.55 10^3/uL   Basophils 0.07 0.00 - 0.09 10^3/uL   Neutrophil % 62.9 32.0 - 70.0 %   Lymphocyte % 26.1 10.0 - 50.0 %   Monocyte % 8.1 4.0 - 13.0 %   Eosinophil % 1.9 1.0 - 5.0 %   Basophil% 0.7 0.0 - 2.0 %   Immature Granulocyte % 0.3 <=0.7 %   Immature Granulocyte Count 0.03 <=0.06 10^3/L  Comprehensive Metabolic Panel (CMP)  Result Value Ref Range   Glucose 129 (H) 70 - 110 mg/dL   Sodium 860 863 - 854 mmol/L   Potassium 3.9 3.6 - 5.1 mmol/L   Chloride 103 97 - 109 mmol/L   Carbon Dioxide (CO2) 27.3 22.0 - 32.0 mmol/L   Urea  Nitrogen (BUN) 18 7 - 25 mg/dL   Creatinine 0.9 0.7 - 1.3 mg/dL   Glomerular Filtration Rate (eGFR) 88 >60 mL/min/1.73sq m   Calcium  9.3 8.7 - 10.3 mg/dL   AST  24 8 - 39 U/L   ALT  30 6 - 57 U/L   Alk Phos (alkaline Phosphatase) 50 34 - 104 U/L   Albumin 4.2 3.5 - 4.8 g/dL   Bilirubin, Total 0.8 0.3 - 1.2 mg/dL   Protein, Total 6.7 6.1 - 7.9 g/dL   A/G Ratio 1.7 1.0 - 5.0 gm/dL  Hemoglobin J8R  Result Value Ref Range   Hemoglobin A1C 7.4 (H) 4.2 - 5.6 %   Average Blood Glucose (Calc) 166 mg/dL   Narrative   Normal Range:    4.2 - 5.6% Increased Risk:  5.7 -  6.4% Diabetes:        >= 6.5% Glycemic Control for adults with diabetes:  <7%    Lipid Panel w/calc LDL  Result Value  Ref Range   Cholesterol, Total 127 100 - 200 mg/dL   Triglyceride 783 (H) 35 - 199 mg/dL   HDL (High Density Lipoprotein) Cholesterol 30.1 29.0 - 71.0 mg/dL   LDL Calculated 54 0 - 130 mg/dL   VLDL Cholesterol 43 mg/dL   Cholesterol/HDL Ratio 4.2   Microalbumin/Creatinine Ratio, Random Urine  Result Value Ref Range   Creatinine, Random Urine 129.6 40.0 - 300.0 mg/dL   Urine Albumin, Random 31   mg/L   Urine Albumin/Creatinine Ratio 23.9 <30.0 ug/mg  Thyroid  Stimulating-Hormone (TSH)  Result Value Ref Range   Thyroid  Stimulating Hormone (TSH) 1.452 0.450-5.330 uIU/ml uIU/mL     Review of Systems  No fever, chills, nausea, or vomiting   Exam  Vitals:   12/30/23 1345  BP: 128/80  Pulse: 60  SpO2: 98%  Weight: (!) 105.2 kg (232 lb)  Height: 177.8 cm (5' 10)    Body mass index is 33.29 kg/m.  General: Patient is well-groomed, well-nourished, appears stated age in no acute distress  HEENT: head is atraumatic and normocephalic, neck is supple with no palpable nodules  CV: Regular rhythm and normal heart rate, no murmur, no JVD  Respiratory: Clear to auscultation throughout lung fields with no wheezing, crackles, or rhonchi. No increased work of breathing   Impression/Plan  DM: His A1C was slightly above goal on recent labs. I discussed limiting carbohydrate intake. He would like time to work on lifestyle habits before considering additional medicine.  HTN: Patient is tolerating medicine well and is compliant with medicine. I discussed limiting sodium intake and keeping track of some blood pressure readings. Blood pressure is at goal on current regimen.  Hyperlipidemia: Patient is compliant with medicine and tolerates it well. LDL is at goal on recent labs.  This visit was part of longitudinal care for this patient involving long-term management of  chronic medical conditions.

## 2024-01-07 ENCOUNTER — Emergency Department
Admission: EM | Admit: 2024-01-07 | Discharge: 2024-01-07 | Disposition: A | Discharge status: Home / Self Care | Attending: Emergency Medicine | Admitting: Emergency Medicine

## 2024-01-07 ENCOUNTER — Other Ambulatory Visit: Payer: Self-pay

## 2024-01-07 ENCOUNTER — Emergency Department

## 2024-01-07 DIAGNOSIS — E119 Type 2 diabetes mellitus without complications: Secondary | ICD-10-CM | POA: Diagnosis not present

## 2024-01-07 DIAGNOSIS — X500XXA Overexertion from strenuous movement or load, initial encounter: Secondary | ICD-10-CM | POA: Insufficient documentation

## 2024-01-07 DIAGNOSIS — R0789 Other chest pain: Secondary | ICD-10-CM | POA: Insufficient documentation

## 2024-01-07 DIAGNOSIS — R079 Chest pain, unspecified: Secondary | ICD-10-CM | POA: Diagnosis not present

## 2024-01-07 DIAGNOSIS — I10 Essential (primary) hypertension: Secondary | ICD-10-CM | POA: Insufficient documentation

## 2024-01-07 LAB — BASIC METABOLIC PANEL WITH GFR
Anion gap: 12 (ref 5–15)
BUN: 15 mg/dL (ref 8–23)
CO2: 24 mmol/L (ref 22–32)
Calcium: 9 mg/dL (ref 8.9–10.3)
Chloride: 103 mmol/L (ref 98–111)
Creatinine, Ser: 0.87 mg/dL (ref 0.61–1.24)
GFR, Estimated: >60 mL/min (ref >60)
Glucose, Bld: 130 mg/dL — ABNORMAL HIGH (ref 70–99)
Potassium: 3.7 mmol/L (ref 3.5–5.1)
Sodium: 138 mmol/L (ref 135–145)

## 2024-01-07 LAB — CBC
HCT: 41.7 % (ref 39.0–52.0)
Hemoglobin: 14.1 g/dL (ref 13.0–17.0)
MCH: 29 pg (ref 26.0–34.0)
MCHC: 33.8 g/dL (ref 30.0–36.0)
MCV: 85.8 fL (ref 80.0–100.0)
Platelets: 188 K/uL (ref 150–400)
RBC: 4.86 MIL/uL (ref 4.22–5.81)
RDW: 12.9 % (ref 11.5–15.5)
WBC: 8.3 K/uL (ref 4.0–10.5)
nRBC: 0 % (ref 0.0–0.2)

## 2024-01-07 LAB — TROPONIN T, HIGH SENSITIVITY
Troponin T High Sensitivity: <15 ng/L (ref 0–19)
Troponin T High Sensitivity: <15 ng/L (ref 0–19)

## 2024-01-07 NOTE — Discharge Instructions (Signed)
Return to the ER for new, worsening, or persistent severe chest pain, difficulty breathing, weakness or lightheadedness, or any other new or worsening symptoms that concern you. 

## 2024-01-07 NOTE — ED Notes (Signed)
 Bilateral upper chest soreness last night and this AM. Did do some heavy lifting yesterday yesterday AM. Denies dizziness, SOB. Skin is dry.

## 2024-01-07 NOTE — ED Triage Notes (Signed)
 Patient reports he has had two episodes of chest pain in two days, both lasting about 15 minutes. Patient denies chest pain at this time.

## 2024-01-07 NOTE — ED Notes (Signed)
 Pt to xray

## 2024-01-07 NOTE — ED Provider Notes (Signed)
 [facility] Provider Note    Event Date/Time   First MD Initiated Contact with Patient 01/07/24 1145     (approximate)   History   Chest Pain   HPI  Kary Colaizzi is a 78 y.o. male with a history of hypertension, hyperlipidemia, type 2 diabetes, BPH, OSA, and GERD who presents with 2 episodes of chest pain, 1 last night and 1 today, described as a sensation of pressure or pushing, occurring bilaterally in his upper chest, and resolving after about 10 to 15 minutes both times.  He denies any associated shortness of breath, dizziness or lightheadedness, or any nausea.  He states that yesterday earlier he had been doing some heavy lifting outside although was not exerting himself during the episodes of chest pain.  He is not having any pain at this time.  I reviewed the past medical records for the patient's most recent outpatient encounter was on 11/6 with family medicine for a follow-up visit for his chronic conditions.   Physical Exam   Triage Vital Signs: ED Triage Vitals [01/07/24 1113]  Encounter Vitals Group     BP (!) 184/81     Girls Systolic BP Percentile      Girls Diastolic BP Percentile      Boys Systolic BP Percentile      Boys Diastolic BP Percentile      Pulse Rate 68     Resp 18     Temp 98 F (36.7 C)     Temp Source Oral     SpO2 97 %     Weight      Height      Head Circumference      Peak Flow      Pain Score      Pain Loc      Pain Education      Exclude from Growth Chart     Most recent vital signs: Vitals:   01/07/24 1250 01/07/24 1400  BP: (!) 171/78 (!) 168/75  Pulse: 64 63  Resp: 16 20  Temp:    SpO2: 96% 96%     General: Alert, well-appearing, no distress.  CV:  Good peripheral perfusion.  Normal heart sounds. Resp:  Normal effort.  Lungs CTAB. Abd:  No distention.  Other:  No peripheral edema.   ED Results / Procedures / Treatments   Labs (all labs ordered are listed, but only abnormal  results are displayed) Labs Reviewed  BASIC METABOLIC PANEL WITH GFR - Abnormal; Notable for the following components:      Result Value   Glucose, Bld 130 (*)    All other components within normal limits  CBC  TROPONIN T, HIGH SENSITIVITY  TROPONIN T, HIGH SENSITIVITY     EKG  ED ECG REPORT I, Waylon Cassis, the attending physician, personally viewed and interpreted this ECG.  Date: 01/07/2024 EKG Time: 1108 Rate: 69 Rhythm: normal sinus rhythm QRS Axis: normal Intervals: LBBB ST/T Wave abnormalities: normal Narrative Interpretation: no evidence of acute ischemia    RADIOLOGY  Chest x-ray: I independently viewed and interpreted the images; there is no focal consolidation or edema  PROCEDURES:  Critical Care performed: No  Procedures   MEDICATIONS ORDERED IN ED: Medications - No data to display   IMPRESSION / MDM / ASSESSMENT AND PLAN / ED COURSE  I reviewed the triage vital signs and the nursing notes.  78 year old male with PMH as noted above presents with 2 episodes of atypical nonexertional  chest pain that resolved on their own.  He is asymptomatic currently.  On exam his blood pressure is elevated.  Other vital signs are normal.  Physical exam is unremarkable for acute findings.  EKG shows old LBBB with no ischemic findings.  Differential diagnosis includes, but is not limited to, costochondritis, muscle strain or spasm, other musculoskeletal chest wall pain, GERD, less likely ACS.  There is no evidence of aortic dissection or other vascular cause, or findings to suggest PE.  Initial troponin is negative.  BMP and CBC show no acute findings.  We will obtain a repeat troponin and reassess.  Patient's presentation is most consistent with acute complicated illness / injury requiring diagnostic workup.  ----------------------------------------- 1:53 PM on 01/07/2024 -----------------------------------------  Repeat troponin is negative.  The patient  remains without chest pain.  He is stable for discharge home.  I counseled him on the results of the workup.  I gave strict return precautions, and he expresses understanding.  FINAL CLINICAL IMPRESSION(S) / ED DIAGNOSES   Final diagnoses:  Atypical chest pain     Rx / DC Orders   ED Discharge Orders     None        Note:  This document was prepared using Dragon voice recognition software and may include unintentional dictation errors.    Jacolyn Pae, MD 01/07/24 1945
